# Patient Record
Sex: Female | Born: 1935 | Race: White | Hispanic: No | Marital: Married | Smoking: Former smoker
Health system: Southern US, Community
[De-identification: ages and names within clinical notes are randomized; demographics above are authoritative.]

## PROBLEM LIST (undated history)

## (undated) DIAGNOSIS — I350 Nonrheumatic aortic (valve) stenosis: Secondary | ICD-10-CM

## (undated) DIAGNOSIS — M353 Polymyalgia rheumatica: Secondary | ICD-10-CM

## (undated) DIAGNOSIS — H409 Unspecified glaucoma: Secondary | ICD-10-CM

## (undated) DIAGNOSIS — E785 Hyperlipidemia, unspecified: Secondary | ICD-10-CM

## (undated) DIAGNOSIS — M1990 Unspecified osteoarthritis, unspecified site: Secondary | ICD-10-CM

## (undated) DIAGNOSIS — I739 Peripheral vascular disease, unspecified: Secondary | ICD-10-CM

## (undated) DIAGNOSIS — I10 Essential (primary) hypertension: Secondary | ICD-10-CM

## (undated) DIAGNOSIS — I82413 Acute embolism and thrombosis of femoral vein, bilateral: Secondary | ICD-10-CM

## (undated) DIAGNOSIS — K311 Adult hypertrophic pyloric stenosis: Secondary | ICD-10-CM

## (undated) DIAGNOSIS — Z9189 Other specified personal risk factors, not elsewhere classified: Secondary | ICD-10-CM

## (undated) DIAGNOSIS — C649 Malignant neoplasm of unspecified kidney, except renal pelvis: Secondary | ICD-10-CM

## (undated) HISTORY — DX: Other specified personal risk factors, not elsewhere classified: Z91.89

## (undated) HISTORY — DX: Peripheral vascular disease, unspecified: I73.9

## (undated) HISTORY — PX: ABDOMINAL HYSTERECTOMY: SHX81

## (undated) HISTORY — PX: TONSILLECTOMY: SUR1361

## (undated) HISTORY — DX: Acute embolism and thrombosis of femoral vein, bilateral: I82.413

## (undated) HISTORY — DX: Nonrheumatic aortic (valve) stenosis: I35.0

## (undated) HISTORY — DX: Polymyalgia rheumatica: M35.3

## (undated) HISTORY — DX: Hyperlipidemia, unspecified: E78.5

## (undated) HISTORY — DX: Hypercalcemia: E83.52

## (undated) HISTORY — PX: APPENDECTOMY: SHX54

## (undated) HISTORY — PX: MASTECTOMY: SHX3

## (undated) HISTORY — DX: Unspecified osteoarthritis, unspecified site: M19.90

## (undated) HISTORY — DX: Essential (primary) hypertension: I10

---
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
## 1983-08-19 HISTORY — PX: BREAST SURGERY: SHX581

## 2004-09-04 ENCOUNTER — Ambulatory Visit: Payer: Self-pay | Admitting: Internal Medicine

## 2004-11-20 ENCOUNTER — Encounter: Admission: RE | Admit: 2004-11-20 | Discharge: 2004-11-20 | Payer: Self-pay | Admitting: Internal Medicine

## 2005-03-17 ENCOUNTER — Ambulatory Visit: Payer: Self-pay | Admitting: Internal Medicine

## 2005-06-19 ENCOUNTER — Ambulatory Visit: Payer: Self-pay | Admitting: Internal Medicine

## 2005-09-11 ENCOUNTER — Ambulatory Visit: Payer: Self-pay | Admitting: Internal Medicine

## 2005-10-03 ENCOUNTER — Ambulatory Visit: Payer: Self-pay | Admitting: Internal Medicine

## 2005-10-13 ENCOUNTER — Ambulatory Visit: Payer: Self-pay

## 2005-10-13 ENCOUNTER — Encounter: Payer: Self-pay | Admitting: Internal Medicine

## 2005-10-22 ENCOUNTER — Encounter
Admission: RE | Admit: 2005-10-22 | Discharge: 2006-01-20 | Payer: Self-pay | Admitting: Physical Medicine & Rehabilitation

## 2005-10-22 ENCOUNTER — Ambulatory Visit: Payer: Self-pay | Admitting: Physical Medicine & Rehabilitation

## 2005-11-03 ENCOUNTER — Encounter
Admission: RE | Admit: 2005-11-03 | Discharge: 2006-01-07 | Payer: Self-pay | Admitting: Physical Medicine & Rehabilitation

## 2005-11-17 ENCOUNTER — Ambulatory Visit: Payer: Self-pay | Admitting: Internal Medicine

## 2005-11-19 ENCOUNTER — Ambulatory Visit: Payer: Self-pay | Admitting: Internal Medicine

## 2005-11-24 ENCOUNTER — Encounter: Admission: RE | Admit: 2005-11-24 | Discharge: 2005-11-24 | Payer: Self-pay | Admitting: Internal Medicine

## 2005-12-01 ENCOUNTER — Ambulatory Visit: Payer: Self-pay | Admitting: Physical Medicine & Rehabilitation

## 2005-12-29 ENCOUNTER — Ambulatory Visit: Payer: Self-pay | Admitting: Physical Medicine & Rehabilitation

## 2006-01-14 ENCOUNTER — Ambulatory Visit: Payer: Self-pay | Admitting: Internal Medicine

## 2006-06-05 ENCOUNTER — Encounter: Admission: RE | Admit: 2006-06-05 | Discharge: 2006-06-05 | Payer: Self-pay | Admitting: Obstetrics and Gynecology

## 2006-06-11 ENCOUNTER — Ambulatory Visit: Payer: Self-pay | Admitting: Internal Medicine

## 2006-11-17 ENCOUNTER — Encounter: Payer: Self-pay | Admitting: Internal Medicine

## 2006-11-17 ENCOUNTER — Ambulatory Visit: Payer: Self-pay | Admitting: Internal Medicine

## 2006-11-25 ENCOUNTER — Ambulatory Visit: Payer: Self-pay | Admitting: Internal Medicine

## 2006-11-26 ENCOUNTER — Encounter: Admission: RE | Admit: 2006-11-26 | Discharge: 2006-11-26 | Payer: Self-pay | Admitting: Internal Medicine

## 2006-12-10 ENCOUNTER — Ambulatory Visit: Payer: Self-pay

## 2007-01-04 ENCOUNTER — Ambulatory Visit: Payer: Self-pay | Admitting: Internal Medicine

## 2007-01-05 LAB — CONVERTED CEMR LAB
GFR calc Af Amer: 78 mL/min
GFR calc non Af Amer: 65 mL/min
Glucose, Bld: 94 mg/dL (ref 70–99)
Potassium: 3.9 meq/L (ref 3.5–5.1)
Sodium: 142 meq/L (ref 135–145)

## 2007-01-07 ENCOUNTER — Ambulatory Visit: Payer: Self-pay | Admitting: Cardiology

## 2007-01-13 ENCOUNTER — Ambulatory Visit: Payer: Self-pay | Admitting: Internal Medicine

## 2007-04-05 ENCOUNTER — Telehealth: Payer: Self-pay | Admitting: Internal Medicine

## 2007-04-30 ENCOUNTER — Ambulatory Visit: Payer: Self-pay | Admitting: Internal Medicine

## 2007-04-30 DIAGNOSIS — M810 Age-related osteoporosis without current pathological fracture: Secondary | ICD-10-CM | POA: Insufficient documentation

## 2007-04-30 LAB — CONVERTED CEMR LAB
ALT: 27 units/L (ref 0–35)
AST: 33 units/L (ref 0–37)
Basophils Relative: 0.4 % (ref 0.0–1.0)
Bilirubin, Direct: 0.2 mg/dL (ref 0.0–0.3)
CO2: 32 meq/L (ref 19–32)
Calcium: 9.5 mg/dL (ref 8.4–10.5)
Creatinine, Ser: 0.8 mg/dL (ref 0.4–1.2)
Eosinophils Relative: 1.6 % (ref 0.0–5.0)
GFR calc Af Amer: 90 mL/min
Glucose, Bld: 95 mg/dL (ref 70–99)
Hemoglobin: 13.2 g/dL (ref 12.0–15.0)
Lymphocytes Relative: 39.4 % (ref 12.0–46.0)
Neutro Abs: 3.6 10*3/uL (ref 1.4–7.7)
Nitrite: NEGATIVE
Platelets: 272 10*3/uL (ref 150–400)
RDW: 12 % (ref 11.5–14.6)
Specific Gravity, Urine: 1.015
Total Bilirubin: 0.9 mg/dL (ref 0.3–1.2)
Total Protein: 6.7 g/dL (ref 6.0–8.3)
VLDL: 10 mg/dL (ref 0–40)
WBC Urine, dipstick: NEGATIVE
WBC: 6.7 10*3/uL (ref 4.5–10.5)

## 2007-05-06 DIAGNOSIS — E785 Hyperlipidemia, unspecified: Secondary | ICD-10-CM | POA: Insufficient documentation

## 2007-05-06 DIAGNOSIS — M1990 Unspecified osteoarthritis, unspecified site: Secondary | ICD-10-CM | POA: Insufficient documentation

## 2007-05-06 DIAGNOSIS — I10 Essential (primary) hypertension: Secondary | ICD-10-CM | POA: Insufficient documentation

## 2007-05-06 DIAGNOSIS — I872 Venous insufficiency (chronic) (peripheral): Secondary | ICD-10-CM | POA: Insufficient documentation

## 2007-05-07 ENCOUNTER — Ambulatory Visit: Payer: Self-pay | Admitting: Internal Medicine

## 2007-05-07 LAB — CONVERTED CEMR LAB: Cholesterol, target level: 200 mg/dL

## 2007-05-28 ENCOUNTER — Ambulatory Visit: Payer: Self-pay | Admitting: Internal Medicine

## 2007-06-29 ENCOUNTER — Telehealth: Payer: Self-pay | Admitting: Internal Medicine

## 2007-11-30 ENCOUNTER — Encounter: Admission: RE | Admit: 2007-11-30 | Discharge: 2007-11-30 | Payer: Self-pay | Admitting: Internal Medicine

## 2008-05-23 ENCOUNTER — Ambulatory Visit: Payer: Self-pay | Admitting: Internal Medicine

## 2008-05-23 ENCOUNTER — Encounter: Payer: Self-pay | Admitting: *Deleted

## 2008-05-23 LAB — CONVERTED CEMR LAB
Bilirubin Urine: NEGATIVE
Glucose, Urine, Semiquant: NEGATIVE
Ketones, urine, test strip: NEGATIVE
Protein, U semiquant: NEGATIVE
Urobilinogen, UA: 0.2
pH: 7

## 2008-05-26 LAB — CONVERTED CEMR LAB
ALT: 24 units/L (ref 0–35)
AST: 32 units/L (ref 0–37)
Basophils Absolute: 0.1 10*3/uL (ref 0.0–0.1)
Basophils Relative: 0.7 % (ref 0.0–3.0)
Bilirubin, Direct: 0.2 mg/dL (ref 0.0–0.3)
CO2: 33 meq/L — ABNORMAL HIGH (ref 19–32)
Calcium: 9.8 mg/dL (ref 8.4–10.5)
Chloride: 104 meq/L (ref 96–112)
Cholesterol: 192 mg/dL (ref 0–200)
Creatinine, Ser: 0.8 mg/dL (ref 0.4–1.2)
Eosinophils Absolute: 0.1 10*3/uL (ref 0.0–0.7)
GFR calc non Af Amer: 74 mL/min
HDL: 97.9 mg/dL (ref >39.0)
LDL Cholesterol: 82 mg/dL (ref 0–99)
Lymphocytes Relative: 40.2 % (ref 12.0–46.0)
MCHC: 35.3 g/dL (ref 30.0–36.0)
MCV: 95 fL (ref 78.0–100.0)
Neutrophils Relative %: 49 % (ref 43.0–77.0)
RDW: 12.4 % (ref 11.5–14.6)
Sodium: 144 meq/L (ref 135–145)
TSH: 1.64 microintl units/mL (ref 0.35–5.50)
Total Bilirubin: 0.9 mg/dL (ref 0.3–1.2)
Triglycerides: 60 mg/dL (ref 0–149)
VLDL: 12 mg/dL (ref 0–40)

## 2008-06-07 ENCOUNTER — Telehealth: Payer: Self-pay | Admitting: *Deleted

## 2008-06-14 ENCOUNTER — Ambulatory Visit: Payer: Self-pay | Admitting: Internal Medicine

## 2008-06-15 ENCOUNTER — Encounter: Payer: Self-pay | Admitting: Internal Medicine

## 2008-06-15 LAB — CONVERTED CEMR LAB: Fecal Occult Bld: NEGATIVE

## 2008-07-17 ENCOUNTER — Encounter: Admission: RE | Admit: 2008-07-17 | Discharge: 2008-07-17 | Payer: Self-pay | Admitting: Obstetrics and Gynecology

## 2008-08-18 ENCOUNTER — Encounter: Payer: Self-pay | Admitting: Internal Medicine

## 2008-11-10 ENCOUNTER — Ambulatory Visit (HOSPITAL_COMMUNITY): Admission: RE | Admit: 2008-11-10 | Discharge: 2008-11-10 | Payer: Self-pay | Admitting: Obstetrics and Gynecology

## 2008-12-01 ENCOUNTER — Encounter: Admission: RE | Admit: 2008-12-01 | Discharge: 2008-12-01 | Payer: Self-pay | Admitting: Internal Medicine

## 2008-12-07 ENCOUNTER — Emergency Department (HOSPITAL_COMMUNITY): Admission: EM | Admit: 2008-12-07 | Discharge: 2008-12-07 | Payer: Self-pay | Admitting: Emergency Medicine

## 2008-12-26 ENCOUNTER — Encounter: Payer: Self-pay | Admitting: *Deleted

## 2009-05-01 ENCOUNTER — Encounter: Payer: Self-pay | Admitting: Internal Medicine

## 2009-05-29 ENCOUNTER — Ambulatory Visit: Payer: Self-pay | Admitting: Internal Medicine

## 2009-05-29 DIAGNOSIS — H409 Unspecified glaucoma: Secondary | ICD-10-CM | POA: Insufficient documentation

## 2009-05-29 HISTORY — DX: Unspecified glaucoma: H40.9

## 2009-05-31 LAB — CONVERTED CEMR LAB
ALT: 22 units/L (ref 0–35)
AST: 27 units/L (ref 0–37)
Alkaline Phosphatase: 46 units/L (ref 39–117)
Basophils Absolute: 0 10*3/uL (ref 0.0–0.1)
Basophils Relative: 0.8 % (ref 0.0–3.0)
Bilirubin, Direct: 0.1 mg/dL (ref 0.0–0.3)
CO2: 29 meq/L (ref 19–32)
Chloride: 105 meq/L (ref 96–112)
Eosinophils Relative: 2.3 % (ref 0.0–5.0)
HCT: 36 % (ref 36.0–46.0)
Hemoglobin: 12.6 g/dL (ref 12.0–15.0)
LDL Cholesterol: 91 mg/dL (ref 0–99)
Lymphocytes Relative: 37.3 % (ref 12.0–46.0)
Monocytes Relative: 6.6 % (ref 3.0–12.0)
Neutro Abs: 3.4 10*3/uL (ref 1.4–7.7)
Potassium: 4.5 meq/L (ref 3.5–5.1)
RBC: 3.81 M/uL — ABNORMAL LOW (ref 3.87–5.11)
RDW: 12.6 % (ref 11.5–14.6)
Total Bilirubin: 0.9 mg/dL (ref 0.3–1.2)
Total CHOL/HDL Ratio: 2
Total Protein: 7.3 g/dL (ref 6.0–8.3)
WBC: 6.2 10*3/uL (ref 4.5–10.5)

## 2009-06-18 ENCOUNTER — Ambulatory Visit: Payer: Self-pay | Admitting: Internal Medicine

## 2009-06-18 ENCOUNTER — Ambulatory Visit (HOSPITAL_COMMUNITY): Admission: RE | Admit: 2009-06-18 | Discharge: 2009-06-18 | Payer: Self-pay | Admitting: Internal Medicine

## 2009-06-18 ENCOUNTER — Encounter: Payer: Self-pay | Admitting: Internal Medicine

## 2009-06-18 ENCOUNTER — Ambulatory Visit: Payer: Self-pay

## 2009-10-16 ENCOUNTER — Ambulatory Visit: Payer: Self-pay | Admitting: Internal Medicine

## 2009-11-05 ENCOUNTER — Encounter: Payer: Self-pay | Admitting: Internal Medicine

## 2009-11-05 ENCOUNTER — Ambulatory Visit: Payer: Self-pay

## 2009-11-05 ENCOUNTER — Ambulatory Visit: Payer: Self-pay | Admitting: Internal Medicine

## 2009-11-05 ENCOUNTER — Ambulatory Visit (HOSPITAL_COMMUNITY): Admission: RE | Admit: 2009-11-05 | Discharge: 2009-11-05 | Payer: Self-pay | Admitting: Family Medicine

## 2009-11-20 ENCOUNTER — Ambulatory Visit: Payer: Self-pay | Admitting: Internal Medicine

## 2009-11-20 LAB — CONVERTED CEMR LAB
BUN: 20 mg/dL (ref 6–23)
Basophils Relative: 0.6 % (ref 0.0–3.0)
CO2: 29 meq/L (ref 19–32)
Chloride: 104 meq/L (ref 96–112)
Eosinophils Absolute: 0.1 10*3/uL (ref 0.0–0.7)
Eosinophils Relative: 1.2 % (ref 0.0–5.0)
HCT: 36.6 % (ref 36.0–46.0)
Lymphs Abs: 2.2 10*3/uL (ref 0.7–4.0)
MCHC: 35.4 g/dL (ref 30.0–36.0)
MCV: 93 fL (ref 78.0–100.0)
Monocytes Absolute: 0.4 10*3/uL (ref 0.1–1.0)
Platelets: 234 10*3/uL (ref 150.0–400.0)
Potassium: 4.6 meq/L (ref 3.5–5.1)
Prothrombin Time: 10.8 s (ref 9.1–11.7)
WBC: 7 10*3/uL (ref 4.5–10.5)

## 2009-11-26 ENCOUNTER — Inpatient Hospital Stay (HOSPITAL_BASED_OUTPATIENT_CLINIC_OR_DEPARTMENT_OTHER): Admission: RE | Admit: 2009-11-26 | Discharge: 2009-11-26 | Payer: Self-pay | Admitting: Internal Medicine

## 2009-11-26 ENCOUNTER — Ambulatory Visit: Payer: Self-pay | Admitting: Internal Medicine

## 2009-11-28 ENCOUNTER — Ambulatory Visit (HOSPITAL_COMMUNITY): Admission: RE | Admit: 2009-11-28 | Discharge: 2009-11-28 | Payer: Self-pay | Admitting: Obstetrics and Gynecology

## 2009-11-29 ENCOUNTER — Ambulatory Visit: Payer: Self-pay | Admitting: Surgery

## 2009-11-29 ENCOUNTER — Encounter: Payer: Self-pay | Admitting: Internal Medicine

## 2009-12-04 ENCOUNTER — Encounter: Admission: RE | Admit: 2009-12-04 | Discharge: 2009-12-04 | Payer: Self-pay | Admitting: Internal Medicine

## 2009-12-04 LAB — HM MAMMOGRAPHY

## 2009-12-16 HISTORY — PX: CARDIAC VALVE REPLACEMENT: SHX585

## 2009-12-24 ENCOUNTER — Encounter: Payer: Self-pay | Admitting: Surgery

## 2009-12-26 ENCOUNTER — Encounter: Payer: Self-pay | Admitting: Surgery

## 2009-12-26 ENCOUNTER — Inpatient Hospital Stay (HOSPITAL_COMMUNITY): Admission: RE | Admit: 2009-12-26 | Discharge: 2009-12-30 | Payer: Self-pay | Admitting: Surgery

## 2009-12-26 ENCOUNTER — Ambulatory Visit: Payer: Self-pay | Admitting: Surgery

## 2009-12-27 ENCOUNTER — Encounter: Payer: Self-pay | Admitting: Internal Medicine

## 2010-01-03 ENCOUNTER — Encounter: Payer: Self-pay | Admitting: Internal Medicine

## 2010-01-17 ENCOUNTER — Ambulatory Visit: Payer: Self-pay | Admitting: Internal Medicine

## 2010-01-22 ENCOUNTER — Ambulatory Visit: Payer: Self-pay | Admitting: Surgery

## 2010-01-22 ENCOUNTER — Encounter: Admission: RE | Admit: 2010-01-22 | Discharge: 2010-01-22 | Payer: Self-pay | Admitting: Surgery

## 2010-01-22 ENCOUNTER — Encounter: Payer: Self-pay | Admitting: Internal Medicine

## 2010-02-21 ENCOUNTER — Ambulatory Visit: Payer: Self-pay | Admitting: Internal Medicine

## 2010-03-07 ENCOUNTER — Encounter: Payer: Self-pay | Admitting: Internal Medicine

## 2010-03-07 ENCOUNTER — Encounter (HOSPITAL_COMMUNITY)
Admission: RE | Admit: 2010-03-07 | Discharge: 2010-05-17 | Payer: Self-pay | Source: Home / Self Care | Admitting: Internal Medicine

## 2010-03-11 ENCOUNTER — Encounter: Payer: Self-pay | Admitting: Internal Medicine

## 2010-03-21 ENCOUNTER — Encounter: Payer: Self-pay | Admitting: Internal Medicine

## 2010-03-28 ENCOUNTER — Encounter: Payer: Self-pay | Admitting: Internal Medicine

## 2010-04-01 ENCOUNTER — Encounter: Payer: Self-pay | Admitting: Internal Medicine

## 2010-04-08 ENCOUNTER — Telehealth: Payer: Self-pay | Admitting: Internal Medicine

## 2010-04-12 ENCOUNTER — Ambulatory Visit: Payer: Self-pay | Admitting: Internal Medicine

## 2010-05-01 ENCOUNTER — Encounter: Payer: Self-pay | Admitting: Internal Medicine

## 2010-05-18 ENCOUNTER — Encounter (HOSPITAL_COMMUNITY)
Admission: RE | Admit: 2010-05-18 | Discharge: 2010-08-16 | Payer: Self-pay | Source: Home / Self Care | Attending: Internal Medicine | Admitting: Internal Medicine

## 2010-06-07 ENCOUNTER — Ambulatory Visit: Payer: Self-pay | Admitting: Internal Medicine

## 2010-06-11 LAB — CONVERTED CEMR LAB
AST: 32 units/L (ref 0–37)
Albumin: 3.9 g/dL (ref 3.5–5.2)
Alkaline Phosphatase: 60 units/L (ref 39–117)
Basophils Absolute: 0 10*3/uL (ref 0.0–0.1)
Basophils Relative: 0.5 % (ref 0.0–3.0)
Calcium: 9.4 mg/dL (ref 8.4–10.5)
Eosinophils Relative: 1.2 % (ref 0.0–5.0)
GFR calc non Af Amer: 76.82 mL/min (ref >60)
HDL: 77.1 mg/dL (ref >39.00)
Hemoglobin: 12.1 g/dL (ref 12.0–15.0)
Lymphocytes Relative: 29.6 % (ref 12.0–46.0)
Monocytes Relative: 6 % (ref 3.0–12.0)
Neutro Abs: 4.4 10*3/uL (ref 1.4–7.7)
RBC: 3.89 M/uL (ref 3.87–5.11)
Sodium: 142 meq/L (ref 135–145)
Total CHOL/HDL Ratio: 2
Total Protein: 6.8 g/dL (ref 6.0–8.3)
VLDL: 11.8 mg/dL (ref 0.0–40.0)
WBC: 7 10*3/uL (ref 4.5–10.5)

## 2010-07-02 ENCOUNTER — Ambulatory Visit: Payer: Self-pay | Admitting: Internal Medicine

## 2010-07-02 DIAGNOSIS — M2550 Pain in unspecified joint: Secondary | ICD-10-CM | POA: Insufficient documentation

## 2010-07-05 LAB — CONVERTED CEMR LAB
ANA Titer 1: 1:160 {titer} — ABNORMAL HIGH
Anti Nuclear Antibody(ANA): POSITIVE — AB
Sed Rate: 20 mm/hr (ref 0–22)
Uric Acid, Serum: 4.7 mg/dL (ref 2.4–7.0)

## 2010-07-15 ENCOUNTER — Telehealth: Payer: Self-pay | Admitting: Internal Medicine

## 2010-07-29 ENCOUNTER — Encounter: Payer: Self-pay | Admitting: Internal Medicine

## 2010-08-05 ENCOUNTER — Encounter: Payer: Self-pay | Admitting: Internal Medicine

## 2010-08-20 ENCOUNTER — Encounter: Payer: Self-pay | Admitting: Internal Medicine

## 2010-09-15 LAB — CONVERTED CEMR LAB
Sed Rate: 20 mm/hr (ref 0–22)
Total CK: 92 units/L (ref 7–177)
Uric Acid, Serum: 4.7 mg/dL (ref 2.4–7.0)

## 2010-09-17 NOTE — Consult Note (Signed)
Summary: [facility]  [facility]   Imported By: Maryln Gottron 12/27/2009 14:04:53  _____________________________________________________________________  External Attachment:    Type:   Image     Comment:   External Document

## 2010-09-17 NOTE — Assessment & Plan Note (Signed)
Summary: per check out/sf   Visit Type:  Follow-up Primary Provider:  Madelin Headings MD  CC:  no  complaints.  History of Present Illness: Ms. Kim Donovan is a delightful, 77 year old woman who is the aunt of Dr. Dietrich Pates. She has a history of aortic stenosis and is s/p bioprostheitc AVR on 12/26/09.  Remainder of her past medical history is notable for borderline hypertension with questionable white coat syndrome.    Returns for post-AVR f/u.  Feels well gretting her energy back. Does get orthostatic/presyncopal at times but it is getting better. No edema or SOB. Starting CR in 2 weeks.   Current Medications (verified): 1)  Simvastatin 20 Mg  Tabs (Simvastatin) .Marland Kitchen.. 1 Tablet By Mouth Daily 2)  Travatan Z 0.004 %  Soln (Travoprost) .Marland Kitchen.. 1 Gtt in Rt Eye Once Daily 3)  Baby Aspirin 81 Mg  Chew (Aspirin) .Marland Kitchen.. 1 By Mouth Once Daily 4)  Centrum Silver Ultra Womens  Tabs (Multiple Vitamins-Minerals) 5)  Fish Oil 1000 Mg  Caps (Omega-3 Fatty Acids) .Marland Kitchen.. 1 By Mouth Once Daily 6)  Citracal/vitamin D 250-200 Mg-Unit Tabs (Calcium Citrate-Vitamin D) .... Take 1 By Mouth Three Times A Day 7)  Dorzolamide-Timolol 2-0.5 % Soln (Dorzolamide-Timolol) 8)  Reclast 5 Mg/189ml Soln (Zoledronic Acid) .... Started in March 2010  Allergies (verified): 1)  ! * Tramadol  Past History:  Past Medical History: Last updated: 01/17/2010 Hyperlipidemia Hypertension Osteoarthritis Osteoporosis/osteopenia Peripheral vascular disease  vwenous disease  Aortic stenosis, severe       --Myoview neg ischemia April 2008       --echo 11/10: mean AVA gradient  ~47       --s/p bioprosthetic AVR  Review of Systems       As per HPI and past medical history; otherwise all systems negative.   Vital Signs:  Patient profile:   77 year old female Menstrual status:  hysterectomy Height:      67 inches Weight:      145 pounds BMI:     22.79 Pulse rate:   75 / minute BP sitting:   128 / 74  (left arm) Cuff size:    regular  Vitals Entered By: Hardin Negus, RMA (February 21, 2010 1:53 PM)  Physical Exam  General:  Looks good. no resp difficulty HEENT: normal Neck: supple. no JVD.  Cor: Sternal site looks good. PMI nondisplaced. Regular rate & rhythm. No rubs, Valve sounds good. crisp s2 Lungs: clear Abdomen: soft, nontender, nondistended. Good bowel sounds. Extremities: no cyanosis, clubbing, rash, edema Neuro: alert & orientedx3, cranial nerves grossly intact. moves all 4 extremities w/o difficulty. affect pleasant    Impression & Recommendations:  Problem # 1:  AORTIC STENOSIS (ICD-424.1) s/p AVR. Doing well. Mild orthostasis. Reminded her to get up slowly and stay hydrated. Can use compression stockings as needed. Agree with CR.   Patient Instructions: 1)  Your physician wants you to follow-up in:  4 months.  You will receive a reminder letter in the mail two months in advance. If you don't receive a letter, please call our office to schedule the follow-up appointment.

## 2010-09-17 NOTE — Progress Notes (Signed)
Summary: Calling regarding getting a referral  Phone Note Call from Patient Call back at Home Phone [phone]   Caller: Patient Summary of Call: Pt regarding a referral for Dr.Beckman Initial call taken by: Judie Grieve,  July 15, 2010 1:10 PM  Follow-up for Phone Call        Dr Gala Romney wants her to see a rheumatologist - was to see her husbands but he is not taking new pts.  needs a referral to Dr Dierdre Forth, she thinks his name is.  Aware I will forward to DB/and [facility] for review and she will be contacted about the appointment/referral Follow-up by: Charolotte Capuchin, RN,  July 15, 2010 2:45 PM  Additional Follow-up for Phone Call Additional follow up Details #1::        order placed for referral to Dr Ronne Binning, RN  July 18, 2010 4:35 PM

## 2010-09-17 NOTE — Miscellaneous (Signed)
Summary: Progress Report/[facility]  Progress Report/[facility]   Imported By: Maryln Gottron 07/24/2010 10:25:50  _____________________________________________________________________  External Attachment:    Type:   Image     Comment:   External Document

## 2010-09-17 NOTE — Assessment & Plan Note (Signed)
Summary: f28m   Visit Type:  Follow-up Primary Provider:  Madelin Headings MD  CC:  no complaints.  History of Present Illness: Ms. Sweeney is a delightful, 77 year old woman who is the aunt of Dr. Dietrich Pates. She has a history of aortic stenosis and is s/p bioprostheitc AVR on 12/26/09.  Remainder of her past medical history is notable for borderline hypertension with questionable white coat syndrome.    Finished cardiac rehab 2 weeks ago. Over past 3-4 weeks having severe musculoskeletal pain particularly in shoulders, back of the arms and right knee. Much worse in am and before she goes to bed. Dreads getting out of bed. During day much better. About 10 days ago stopped simva and didn't notice any improvement. No hot joints, rash or fevers. Saw Dr. Fabian Sharp who recommended tylenol. Verys stressed out and worries alot. Not sleeping well. Feels a bit down.   No palpitations, CP or dyspnea.   Current Medications (verified): 1)  Simvastatin 20 Mg  Tabs (Simvastatin) .Marland Kitchen.. 1 Tablet By Mouth Daily 2)  Travatan Z 0.004 %  Soln (Travoprost) .Marland Kitchen.. 1 Gtt in Rt Eye Once Daily 3)  Baby Aspirin 81 Mg  Chew (Aspirin) .Marland Kitchen.. 1 By Mouth Once Daily 4)  Centrum Silver Ultra Womens  Tabs (Multiple Vitamins-Minerals) 5)  Fish Oil 1000 Mg  Caps (Omega-3 Fatty Acids) .Marland Kitchen.. 1 By Mouth Once Daily 6)  Citracal/vitamin D 250-200 Mg-Unit Tabs (Calcium Citrate-Vitamin D) .... Take 1 By Mouth Three Times A Day 7)  Dorzolamide-Timolol 2-0.5 % Soln (Dorzolamide-Timolol) 8)  Reclast 5 Mg/145ml Soln (Zoledronic Acid) .... Started in March 2010 9)  Amoxicillin 500 Mg Caps (Amoxicillin) .... 4 Tabs By Mouth 1 Hour Before Dental Procedure  Allergies (verified): 1)  ! * Tramadol  Past History:  Past Medical History: Last updated: 06/07/2010 Hyperlipidemia Hypertension Osteoarthritis Osteoporosis/osteopenia Glaucoma Peripheral vascular disease  venous disease  Aortic stenosis, severe       --Myoview neg ischemia April 2008       --echo 11/10: mean AVA gradient  ~47       --s/p bioprosthetic AVR  May 2011  Review of Systems       As per HPI and past medical history; otherwise all systems negative.   Vital Signs:  Patient profile:   77 year old female Menstrual status:  hysterectomy Height:      66.75 inches Weight:      144 pounds BMI:     22.80 Pulse rate:   81 / minute BP sitting:   124 / 70  (left arm) Cuff size:   regular  Vitals Entered By: Hardin Negus, RMA (July 02, 2010 10:59 AM)  Physical Exam  General:  Well appearing. mildly anxious no resp difficulty HEENT: normal Neck: supple. no JVD. Carotids 2+ bilat; no bruits. No lymphadenopathy or thryomegaly appreciated. Cor: PMI nondisplaced. Regular rate & rhythm. No rubs, gallops, murmur. Lungs: clear Abdomen: soft, nontender, nondistended. No hepatosplenomegaly. No bruits or masses. Good bowel sounds. Extremities: no cyanosis, clubbing, rash, edema. no hot joints or rash. no trigger points. Neuro: alert & orientedx3, cranial nerves grossly intact. moves all 4 extremities w/o difficulty. affect pleasant    Impression & Recommendations:  Problem # 1:  AORTIC STENOSIS (ICD-424.1) S/p AVR doing well from cardiac prespective  Problem # 2:  Anxiety/depression This seems to be getting worse. I think she warrants trial of SSRI. Start Celexa 10mg  daily. Titrate as needed.  Problem # 3:  PAIN IN JOINT, SITE UNSPECIFIED (ICD-719.40)  Suspect this is osteoarthritits. Will check seroliges to rule-out anything more ominous If no improvement have suggested f/u with her husband's rheumatologist Dr. Norina Buzzard.   Problem # 4:  HYPERLIPIDEMIA (ICD-272.4) Most recent lipids look great with LDL 77. Doube joint pains related but will hold off until we seem some improvement. Check CK as well.   Other Orders: EKG w/ Interpretation (93000) T-Antinuclear Antib (ANA) [phone]) T-Sed Rate (Automated) [phone]) T-Rheumatoid Factor  [phone]) T-Uric Acid (Blood) [phone]) T-CK Total [phone])  Patient Instructions: 1)  Labs today 2)  Start Celexa 10mg  daily 3)  Follow up in 4 months Prescriptions: CELEXA 10 MG TABS (CITALOPRAM HYDROBROMIDE) Take 1 tablet by mouth once a day  #30 x 6   Entered by:   Meredith Staggers, RN   Authorized by:   Dolores Patty, MD, [facility]   Signed by:   Meredith Staggers, RN on 07/02/2010   Method used:   Electronically to        [facility] [address] [phone]* (retail)       [address]       [place], [place]  [address]       Ph: [phone] or [phone]       Fax: [phone]   RxID:   [id number]

## 2010-09-17 NOTE — Miscellaneous (Signed)
Summary: [facility]   [facility]   Imported By: Roderic Ovens 05/16/2010 15:06:50  _____________________________________________________________________  External Attachment:    Type:   Image     Comment:   External Document

## 2010-09-17 NOTE — Miscellaneous (Signed)
Summary: [facility]  [facility]   Imported By: Roderic Ovens 04/04/2010 13:00:51  _____________________________________________________________________  External Attachment:    Type:   Image     Comment:   External Document

## 2010-09-17 NOTE — Miscellaneous (Signed)
Summary: [facility]  [facility]   Imported By: Roderic Ovens 04/04/2010 13:01:54  _____________________________________________________________________  External Attachment:    Type:   Image     Comment:   External Document

## 2010-09-17 NOTE — Cardiovascular Report (Signed)
Summary: Pre Cath Order  Pre Cath Order   Imported By: Roderic Ovens 11/28/2009 10:35:46  _____________________________________________________________________  External Attachment:    Type:   Image     Comment:   External Document

## 2010-09-17 NOTE — Letter (Signed)
Summary: [facility]  [facility]   Imported By: Maryln Gottron 02/25/2010 12:52:11  _____________________________________________________________________  External Attachment:    Type:   Image     Comment:   External Document

## 2010-09-17 NOTE — Letter (Signed)
Summary: Cardiac Catheterization Instructions- [facility]  [facility], Main Office  [address]   [place], [place] [address]   Phone: [phone]  Fax: [phone]     11/20/2009 MRN: [id number]  [facility] [address] [place], [place]  [address]  Dear Ms. Condon,   You are scheduled for a Cardiac Catheterization on Monday 11/26/09 with Dr. Gala Romney  Please arrive to the 1st floor of the Heart and V[facility] Center at [facility] at 7:30 am / pm on the day of your procedure. Please do not arrive before 6:30 a.m. Call the Heart and Vascular Center at [phone] if you are unable to make your appointmnet. The Code to get into the parking garage under the building is 0001 Take the elevators to the 1st floor. You must have someone to drive you home. Someone must be with you for the first 24 hours after you arrive home. Please wear clothes that are easy to get on and off and wear slip-on shoes. Do not eat or drink after midnight except water with your medications that morning. Bring all your medications and current insurance cards with you.  ___ DO NOT take these medications before your procedure: ________________________________________________________________  ___ Make sure you take your aspirin.  _X__ You may take ALL of your medications with water that morning. ________________________________________________________________________________________________________________________________  ___ DO NOT take ANY medications before your procedure.  ___ Pre-med instructions:  ________________________________________________________________________________________________________________________________  The usual length of stay after your procedure is 2 to 3 hours. This can vary.  If you have any questions, please call the office at the number listed above.   Meredith Staggers, RN

## 2010-09-17 NOTE — Miscellaneous (Signed)
Summary: [facility] Phys[facility] Order/Treatment Plan  [facility] Physician Order/Treatment Plan   Imported By: Roderic Ovens 01/10/2010 13:47:31  _____________________________________________________________________  External Attachment:    Type:   Image     Comment:   External Document

## 2010-09-17 NOTE — Assessment & Plan Note (Signed)
Summary: 9:00  rov   Visit Type:  Follow-up Primary Provider:  Madelin Headings MD  CC:  no complaints.  History of Present Illness: Kim Donovan is a delightful, 77 year old woman who is the aunt of Dr. Dietrich Pates who returns for routine follow up of her severe AS.  Remainder of her past medical history is notable for borderline hypertension with questionable white coat syndrome.    She is here with her husband to disucss possible AVR. F/u echo 2 weks ago confirmed severe AS. However she remains asymptomatic.  Walks for 30+ minutes almost every day with husband without SOB, CP. No dizziness. Has mild LE swelling due to varicose veins s/p repair by Dr. Donia Ast. No orthopnea or PND. No change in exercise tolerance - never has problem keeping up with husband or others while walking. No syncope or presyncope.  Current Medications (verified): 1)  Simvastatin 20 Mg  Tabs (Simvastatin) .Marland Kitchen.. 1 Tablet By Mouth Daily 2)  Travatan Z 0.004 %  Soln (Travoprost) .Marland Kitchen.. 1 Gtt in Rt Eye Once Daily 3)  Baby Aspirin 81 Mg  Chew (Aspirin) .Marland Kitchen.. 1 By Mouth Once Daily 4)  Centrum Silver Ultra Womens  Tabs (Multiple Vitamins-Minerals) 5)  Fish Oil 1000 Mg  Caps (Omega-3 Fatty Acids) .Marland Kitchen.. 1 By Mouth Once Daily 6)  Citracal/vitamin D 250-200 Mg-Unit Tabs (Calcium Citrate-Vitamin D) 7)  Dorzolamide-Timolol 2-0.5 % Soln (Dorzolamide-Timolol) 8)  Reclast 5 Mg/111ml Soln (Zoledronic Acid) .... Started in March 2010 9)  Trigosamine .Marland Kitchen.. 2 Tabs Once Daily  Allergies (verified): No Known Drug Allergies  Review of Systems       As per HPI and past medical history; otherwise all systems negative.   Vital Signs:  Patient profile:   77 year old female Menstrual status:  hysterectomy Height:      66.75 inches Weight:      147 pounds BMI:     23.28 Pulse rate:   65 / minute BP sitting:   128 / 84  (left arm) Cuff size:   regular  Vitals Entered By: Hardin Negus, RMA (November 20, 2009 8:55 AM)  Physical  Exam  General:  Gen: well appearing. no resp difficulty HEENT: normal Neck: supple. no JVD. Carotids just minimally delayed bilat; +bilat radiated  bruits. Cor: PMI nondisplaced. Regular rate & rhythm. No rubs, gallops/ 2-3/6 mid peaking murmur AS. S2 only mildly depressed Lungs: clear Abdomen: soft, nontender, nondistended. Good bowel sounds. Extremities: no cyanosis, clubbing, rash, edema Neuro: alert & orientedx3, cranial nerves grossly intact. moves all 4 extremities w/o difficulty. affect pleasant    Impression & Recommendations:  Problem # 1:  AORTIC STENOSIS (ICD-424.1) Although she is asymptomatic, repeat echo confirms severe calcific AS. I had a long talk with her and her husband about timing of surgery. Given the degree of stenosis and her age, my preference would be to have the valve replaced sooner rather than later to minimize the risk of surgical complications. Additionally, the prospect of the surgery has weighed heavy on her mentally and I think it is best if we push forward and get this behind [place]. They understand these issues and want to proceed. We will cardiac cath for next week and then have her f/u with Dr. Laneta Simmers.  Other Orders: Cardiac Catheterization (Cardiac Cath) TLB-BMP (Basic Metabolic Panel-BMET) (80048-METABOL) TLB-CBC Platelet - w/Differential (85025-CBCD) TLB-PT (Protime) (85610-PTP)  Patient Instructions: 1)  Labs today 2)  Your physician has requested that you have a cardiac catheterization.  Cardiac catheterization  is used to diagnose and/or treat various heart conditions. Doctors may recommend this procedure for a number of different reasons. The most common reason is to evaluate chest pain. Chest pain can be a symptom of coronary artery disease (CAD), and cardiac catheterization can show whether plaque is narrowing or blocking your heart's arteries. This procedure is also used to evaluate the valves, as well as measure the blood flow and oxygen levels in  different parts of your heart.  For further information please visit [email or web address]  Please follow instruction sheet, as given. 3)  Follow up in 2 months

## 2010-09-17 NOTE — Assessment & Plan Note (Signed)
Summary: FLU-SHOT/RCD  Nurse Visit   Allergies: 1)  ! * Tramadol  Orders Added: 1)  Flu Vaccine 52yrs + [90658] 2)  Administration Flu vaccine - MCR [G0008]    Flu Vaccine Consent Questions     Do you have a history of severe allergic reactions to this vaccine? no    Any prior history of allergic reactions to egg and/or gelatin? no    Do you have a sensitivity to the preservative Thimersol? no    Do you have a past history of Guillan-Barre Syndrome? no    Do you currently have an acute febrile illness? no    Have you ever had a severe reaction to latex? no    Vaccine information given and explained to patient? yes    Are you currently pregnant? no    Lot Number:[id number]   Exp Date:02/15/2011   Site Given  Left Deltoid IM Romualdo Bolk, CMA (AAMA)  April 12, 2010 8:51 AM

## 2010-09-17 NOTE — Progress Notes (Signed)
Summary: pt needs pre meds  Phone Note Call from Patient Call back at Home Phone [phone]   Caller: Patient Reason for Call: Talk to Nurse, Talk to Doctor Summary of Call: pt needs to be pre medicated prior to cleaning on 8/31 called into [facility] [address] [phone] Initial call taken by: Omer Jack,  April 08, 2010 1:00 PM  Follow-up for Phone Call        rx sent in pt aware Meredith Staggers, RN  April 08, 2010 1:30 PM     New/Updated Medications: AMOXICILLIN 500 MG CAPS (AMOXICILLIN) 4 tabs by mouth 1 hour before dental procedure Prescriptions: AMOXICILLIN 500 MG CAPS (AMOXICILLIN) 4 tabs by mouth 1 hour before dental procedure  #4 x 4   Entered by:   Meredith Staggers, RN   Authorized by:   Dolores Patty, MD, [facility]   Signed by:   Meredith Staggers, RN on 04/08/2010   Method used:   Electronically to        [facility] [address] [phone]* (retail)       [address]       [place], [place]  [address]       Ph: [phone] or [phone]       Fax: [phone]   RxID:   [id number]

## 2010-09-17 NOTE — Assessment & Plan Note (Signed)
Summary: eph/ appt is 9:15/ gd   Primary Provider:  Madelin Headings MD  CC:  ROV-; No complaints.  History of Present Illness: Ms. Dockstader is a delightful, 77 year old woman who is the aunt of Dr. Dietrich Pates. She has a history of aortic stenosis and is s/p bioprostheitc AVR on 12/26/09.  Remainder of her past medical history is notable for borderline hypertension with questionable white coat syndrome.    Returns for post-AVR f/u. Had to stop metoprolol due to profound fatiue and low BP. Feels well but has been afraid to go out and do anything. Denies CP or SOB. No edema. No problem with sternal site.   Current Medications (verified): 1)  Simvastatin 20 Mg  Tabs (Simvastatin) .Marland Kitchen.. 1 Tablet By Mouth Daily 2)  Travatan Z 0.004 %  Soln (Travoprost) .Marland Kitchen.. 1 Gtt in Rt Eye Once Daily 3)  Baby Aspirin 81 Mg  Chew (Aspirin) .Marland Kitchen.. 1 By Mouth Once Daily 4)  Centrum Silver Ultra Womens  Tabs (Multiple Vitamins-Minerals) 5)  Fish Oil 1000 Mg  Caps (Omega-3 Fatty Acids) .Marland Kitchen.. 1 By Mouth Once Daily 6)  Citracal/vitamin D 250-200 Mg-Unit Tabs (Calcium Citrate-Vitamin D) .... Take 1 By Mouth Three Times A Day 7)  Dorzolamide-Timolol 2-0.5 % Soln (Dorzolamide-Timolol) 8)  Reclast 5 Mg/117ml Soln (Zoledronic Acid) .... Started in March 2010 9)  Trigosamine .Marland Kitchen.. 2 Tabs Once Daily 10)  Aleve 220 Mg Tabs (Naproxen Sodium) .... As Needed 11)  Mylanta 200-200-20 Mg/9ml Susp (Alum & Mag Hydroxide-Simeth) .... As Needed  Allergies (verified): 1)  ! * Tramadol  Past History:  Past Medical History: Hyperlipidemia Hypertension Osteoarthritis Osteoporosis/osteopenia Peripheral vascular disease  vwenous disease  Aortic stenosis, severe       --Myoview neg ischemia April 2008       --echo 11/10: mean AVA gradient  ~47       --s/p bioprosthetic AVR  Review of Systems       As per HPI and past medical history; otherwise all systems negative.   Vital Signs:  Patient profile:   77 year old female Menstrual  status:  hysterectomy Height:      67 inches Weight:      144 pounds BMI:     22.64 Pulse rate:   82 / minute BP sitting:   142 / 90  (right arm) Cuff size:   regular  Vitals Entered By: Stanton Kidney, EMT-P (January 17, 2010 9:22 AM)  Physical Exam  General:  Looks good. no resp difficulty HEENT: normal Neck: supple. no JVD. Carotids just minimally delayed bilat; +bilat radiated  bruits. Cor: Sternal site looks good. PMI nondisplaced. Regular rate & rhythm. No rubs, Valve sounds good. crisp s2 Lungs: clear Abdomen: soft, nontender, nondistended. Good bowel sounds. Extremities: no cyanosis, clubbing, rash, edema Neuro: alert & orientedx3, cranial nerves grossly intact. moves all 4 extremities w/o difficulty. affect pleasant    Impression & Recommendations:  Problem # 1:  AORTIC STENOSIS (ICD-424.1) S/p AVR. Doing very well. Encouraged her to increase her activity level. I am also worried she may be developing some post-op depression. We discussed a brief course of sertraline but she wants to see how she does by increasing her activity level first. Will start cardiac rehab after she sees Dr. Laneta Simmers next week.   Other Orders: EKG w/ Interpretation (93000)  Patient Instructions: 1)  Follow up in 4-6 weeks

## 2010-09-17 NOTE — Assessment & Plan Note (Signed)
Summary: 4 month rov/sl   Visit Type:  Follow-up Primary Provider:  Madelin Headings MD  CC:  no complaints.  History of Present Illness: Kim Donovan is a delightful, 77 year old woman who is the aunt of Dr. Dietrich Pates who returns for routine follow up of her severe AS.  Remainder of her past medical history is notable for borderline hypertension with questionable white coat syndrome.    Echo in 11/10 which showed normal LV function with severeAS with mean gradient 47mm HG which had progressed from previous.  Remains fairly active walks for 30+ minutes almost every day with husband without SOB, CP. No dizziness. Has mild LE swelling due to varicose veins s/p repair by Dr. Donia Ast. No orthopnea or PND. No change in exercise tolerance - never has problem keeping up with husband or others while walking. No syncope or presyncope.  Current Medications (verified): 1)  Simvastatin 20 Mg  Tabs (Simvastatin) .Marland Kitchen.. 1 Tablet By Mouth Daily 2)  Travatan Z 0.004 %  Soln (Travoprost) .Marland Kitchen.. 1 Gtt in Rt Eye Once Daily 3)  Baby Aspirin 81 Mg  Chew (Aspirin) .Marland Kitchen.. 1 By Mouth Once Daily 4)  Centrum Silver Ultra Womens  Tabs (Multiple Vitamins-Minerals) 5)  Fish Oil 1000 Mg  Caps (Omega-3 Fatty Acids) .Marland Kitchen.. 1 By Mouth Once Daily 6)  Citracal/vitamin D 250-200 Mg-Unit Tabs (Calcium Citrate-Vitamin D) 7)  Dorzolamide-Timolol 2-0.5 % Soln (Dorzolamide-Timolol) 8)  Reclast 5 Mg/120ml Soln (Zoledronic Acid) .... Started in March 2010 9)  Trigosamine .Marland Kitchen.. 2 Tabs Once Daily  Allergies (verified): No Known Drug Allergies  Past History:  Past Medical History: Hyperlipidemia Hypertension Osteoarthritis Osteoporosis/osteopenia Peripheral vascular disease  vwenous disease  Aortic stenosis, severe       --Myoview neg ischemia April 2008       --echo 11/10: mean AVA gradient  ~47 G0P0  Review of Systems       As per HPI and past medical history; otherwise all systems negative.   Vital Signs:  Patient profile:    77 year old female Menstrual status:  hysterectomy Height:      66.75 inches Weight:      148 pounds BMI:     23.44 Pulse rate:   58 / minute BP sitting:   126 / 74  (right arm) Cuff size:   regular  Vitals Entered By: Hardin Negus, RMA (October 16, 2009 9:08 AM)  Physical Exam  General:  Gen: well appearing. no resp difficulty HEENT: normal Neck: supple. no JVD. Carotids just minimally delayed bilat; +bilat radiated  bruits. Cor: PMI nondisplaced. Regular rate & rhythm. No rubs, gallops/ 2-3/6 mid peaking murmur AS. S2 only mildly depressed Lungs: clear Abdomen: soft, nontender, nondistended. Good bowel sounds. Extremities: no cyanosis, clubbing, rash, edema Neuro: alert & orientedx3, cranial nerves grossly intact. moves all 4 extremities w/o difficulty. affect pleasant    Impression & Recommendations:  Problem # 1:  AORTIC STENOSIS (ICD-424.1) By most recent echo this is severe, however she remains asymptomatic and on exam AS seems moderate at most. However, I do think it is only a matter of time before she becomes symptomatic and given her age, I favor proceeding to AVR early rather than later (likely within the next year or two) and I discussed this with her and Dr. Tenny Craw. Will get f/u echo to reassess valve.   Problem # 2:  HYPERTENSION (ICD-401.9) Blood pressure well controlled. Continue current regimen.  Other Orders: EKG w/ Interpretation (93000) Echocardiogram (Echo)  Patient Instructions:  1)  Your physician has requested that you have an echocardiogram.  Echocardiography is a painless test that uses sound waves to create images of your heart. It provides your doctor with information about the size and shape of your heart and how well your heart's chambers and valves are working.  This procedure takes approximately one hour. There are no restrictions for this procedure. 2)  Follow up in 4 months

## 2010-09-17 NOTE — Letter (Signed)
[facility]   Imported By: Marylou Mccoy 04/09/2010 10:29:11  _____________________________________________________________________  External Attachment:    Type:   Image     Comment:   External Document

## 2010-09-17 NOTE — Miscellaneous (Signed)
Summary: [facility]  [facility]   Imported By: Roderic Ovens 03/25/2010 11:19:13  _____________________________________________________________________  External Attachment:    Type:   Image     Comment:   External Document

## 2010-09-17 NOTE — Assessment & Plan Note (Signed)
Summary: EMP-WILL FAST//CCM   Vital Signs:  Patient profile:   77 year old female Menstrual status:  hysterectomy Height:      66.75 inches Weight:      142 pounds Pulse rate:   60 / minute BP sitting:   148 / 80  (left arm) Cuff size:   regular  Vitals Entered By: Romualdo Bolk, CMA (AAMA) (June 07, 2010 8:20 AM)  Serial Vital Signs/Assessments:  Time      Position  BP       Pulse  Resp  Temp     By                     140/80                         Madelin Headings MD  Comments: right arm sitting By: Madelin Headings MD   CC: Annual Visit for Disease Management- Pt is fasting for labs   History of Present Illness: Kim Donovan comes in today  for  check  up and disease management  Here for Medicare AWV:  1.   Risk factors based on Past M, S, F history: 2.   Physical Activities:  cardiac rehab  3.   Depression/mood:    stable no dx depression anxiety 4.   Hearing:  good.  5.   ADL's:   self  independent.  6.   Fall Risk:  No falls   7.   Home Safety:  reviewed  8.   Height, weight, &visual acuity:  has galucoma under rx and doing well   9.   Counseling:  10.   Labs ordered based on risk factors:  11.           Referral Coordination  12.           Care PlanUTD on mammogram  13.            Cognitive Assessment  Pt is A&Ox3,affect,speech,memory,attention,&motor skills appear intact.  Since last  check up  has had her AVR surgery.  Last week of rehab  post avr surgery  . She recently has had increased  now joint aches esp knees and hands  with increase intensity exercise   Ocass tylenol   mentally feels well but some frustration on bady aches  no fever  and no myalgias doesnt think from  statin med. Is to see  Dr Jones Broom November 15th .      Preventive Care Screening  Prior Values:    Mammogram:  ASSESSMENT: Negative - BI-RADS 1^MM DIGITAL SCREENING (12/04/2009)    Colonoscopy:  normal (08/19/2003)    Last Tetanus Booster:  Td (01/07/2008)    Last Flu Shot:   Fluvax 3+ (04/12/2010)    Last Pneumovax:  given (08/18/2005)   Preventive Screening-Counseling & Management  Alcohol-Tobacco     Alcohol drinks/day: 1     Alcohol type: all     Smoking Status: quit     Year Quit: 30 years  Caffeine-Diet-Exercise     Caffeine use/day: 2 mugs a day     Does Patient Exercise: yes     Type of exercise: walking     Times/week: 5  Hep-HIV-STD-Contraception     Dental Visit-last 6 months yes     Sun Exposure-Excessive: no  Safety-Violence-Falls     Seat Belt Use: yes     Firearms in the Home: no firearms in the home  Smoke Detectors: yes  Current Medications (verified): 1)  Simvastatin 20 Mg  Tabs (Simvastatin) .Marland Kitchen.. 1 Tablet By Mouth Daily 2)  Travatan Z 0.004 %  Soln (Travoprost) .Marland Kitchen.. 1 Gtt in Rt Eye Once Daily 3)  Baby Aspirin 81 Mg  Chew (Aspirin) .Marland Kitchen.. 1 By Mouth Once Daily 4)  Centrum Silver Ultra Womens  Tabs (Multiple Vitamins-Minerals) 5)  Fish Oil 1000 Mg  Caps (Omega-3 Fatty Acids) .Marland Kitchen.. 1 By Mouth Once Daily 6)  Citracal/vitamin D 250-200 Mg-Unit Tabs (Calcium Citrate-Vitamin D) .... Take 1 By Mouth Three Times A Day 7)  Dorzolamide-Timolol 2-0.5 % Soln (Dorzolamide-Timolol) 8)  Reclast 5 Mg/133ml Soln (Zoledronic Acid) .... Started in March 2010 9)  Amoxicillin 500 Mg Caps (Amoxicillin) .... 4 Tabs By Mouth 1 Hour Before Dental Procedure  Allergies (verified): 1)  ! * Tramadol  Past History:  Past medical, surgical, family and social histories (including risk factors) reviewed, and no changes noted (except as noted below).  Past Medical History: Hyperlipidemia Hypertension Osteoarthritis Osteoporosis/osteopenia Glaucoma Peripheral vascular disease  venous disease  Aortic stenosis, severe       --Myoview neg ischemia April 2008       --echo 11/10: mean AVA gradient  ~47       --s/p bioprosthetic AVR  May 2011  Past Surgical History: Reviewed history from 05/29/2009 and no changes required. Appendectomy Hysterectomy  total  for fibroids   Mastectomy Tonsillectomy   Breast surgery 1985   Past History:  Care Management: Gynecology: [facility] Gastroenterology: Juanda [facility] Ophthalmology: [facility]ottie Dawson and [facility] for glaucoma Cardiology : [facility]   Family History: Reviewed history from 05/23/2008 and no changes required. [place] MOm died when pt age 42 from gb sugery ? dvt Fa died 73 MI CVA aunt osteoporosis  Social History: Reviewed history from 05/23/2008 and no changes required. Married non smoker  remote hx walks  2 hh    no pets  Risk analyst Use:  yes Dental Care w/in 6 mos.:  yes Sun Exposure-Excessive:  no  Review of Systems       neg GI GU pulmonary , joints problematic ., NO change vision hearing  has glaucoma  no bleeding injury change in neuro status. rest of ros neg or [place]   Physical Exam  General:  Well-developed,well-nourished,in no acute distress; alert,appropriate and cooperative throughout examination Head:  normocephalic and atraumatic.   Eyes:  vision grossly intact, pupils equal, and pupils round.   Ears:  R ear normal and L ear normal.   Nose:  no external deformity and no nasal discharge.   Mouth:  good dentition and pharynx pink and moist.   Neck:  No deformities, masses, or tenderness noted. Chest Wall:  well healed scars Breasts:  No mass, nodules, thickening, tenderness, bulging, retraction, inflamation, nipple discharge or skin changes noted.   Lungs:  Normal respiratory effort, chest expands symmetrically. Lungs are clear to auscultation, no crackles or wheezes. Heart:  normal rate, regular rhythm, no gallop, and no JVD.  slight sys mlusb non radiation Abdomen:  Bowel sounds positive,abdomen soft and non-tender without masses, organomegaly or hernias noted. no bruits  Genitalia:  per gyne Msk:  oa changes no redness has heberdens nodes  no redness  midl swelling right knee joint  Pulses:  pulses intact without delay   Extremities:  no clubbing cyanosis or edema  Neurologic:   alert & oriented X3 and gait normal.  non focal  Pt is A&Ox3,affect,speech,memory,attention,&motor skills appear intact.  Skin:  turgor normal, color normal,  no ecchymoses, and no petechiae.  lle  chroninc medial changes no pain Cervical Nodes:  No lymphadenopathy noted Axillary Nodes:  No palpable lymphadenopathy Inguinal Nodes:  No significant adenopathy Psych:  Oriented X3, normally interactive, good eye contact, not anxious appearing, and not depressed appearing.     Impression & Recommendations:  Problem # 1:  Preventive Health Care (ICD-V70.0)  reviewed  prevention parameters and diet  continued exercise and fall prevention. currently decline zostovax as husband  is on embrel but stable and he cannot have live vaccines.    seemingly ok for her but she will ask Dr Jimmy Footman her husbands RHEUM.   Orders: Medicare -1st Annual Wellness Visit [phone])  Problem # 2:  HYPERLIPIDEMIA (ICD-272.4)  Her updated medication list for this problem includes:    Simvastatin 20 Mg Tabs (Simvastatin) .Marland Kitchen... 1 tablet by mouth daily  Orders: TLB-Lipid Panel (80061-LIPID) TLB-BMP (Basic Metabolic Panel-BMET) (80048-METABOL) TLB-Hepatic/Liver Function Pnl (80076-HEPATIC) Venipuncture (81191) Specimen Handling (47829)  Labs Reviewed: SGOT: 27 (05/29/2009)   SGPT: 22 (05/29/2009)  Lipid Goals: Chol Goal: 200 (05/07/2007)   HDL Goal: 40 (05/07/2007)   LDL Goal: 100 (05/07/2007)   TG Goal: 150 (05/07/2007)  Prior 10 Yr Risk Heart Disease: 5 % (05/07/2007)   HDL:86.50 (05/29/2009), 97.9 (05/23/2008)  LDL:91 (05/29/2009), 82 (05/23/2008)  Chol:187 (05/29/2009), 192 (05/23/2008)  Trig:49.0 (05/29/2009), 60 (05/23/2008)  Problem # 3:  OSTEOPOROSIS (ICD-733.00)  Her updated medication list for this problem includes:    Citracal/vitamin D 250-200 Mg-unit Tabs (Calcium citrate-vitamin d) .Marland Kitchen... Take 1 by mouth three times a day    Reclast 5 Mg/125ml Soln (Zoledronic acid) ..... Started in march  2010  Orders: TLB-BMP (Basic Metabolic Panel-BMET) (80048-METABOL) TLB-CBC Platelet - w/Differential (85025-CBCD) TLB-TSH (Thyroid Stimulating Hormone) (84443-TSH)  Problem # 4:  OSTEOARTHRITIS (ICD-715.90) problematic  with card reahb joints aknees and hands  but finfishing .  counseled about htis  doesnt seem to be related to  myalgia or meds  had sig decongditioning pre op.   tylenol    pre exercise and as needed .  Her updated medication list for this problem includes:    Baby Aspirin 81 Mg Chew (Aspirin) .Marland Kitchen... 1 by mouth once daily  Orders: TLB-CBC Platelet - w/Differential (85025-CBCD)  Problem # 5:  AORTIC STENOSIS (ICD-424.1) stable  post op  by hx  Her updated medication list for this problem includes:    Baby Aspirin 81 Mg Chew (Aspirin) .Marland Kitchen... 1 by mouth once daily  Orders: TLB-BMP (Basic Metabolic Panel-BMET) (80048-METABOL)  Problem # 6:  HYPERTENSION (ICD-401.9) up some today   take readings at home and has been nl and in rehab .  Has appt with cards soon. Orders: TLB-BMP (Basic Metabolic Panel-BMET) (80048-METABOL)  Complete Medication List: 1)  Simvastatin 20 Mg Tabs (Simvastatin) .Marland Kitchen.. 1 tablet by mouth daily 2)  Travatan Z 0.004 % Soln (Travoprost) .Marland Kitchen.. 1 gtt in rt eye once daily 3)  Baby Aspirin 81 Mg Chew (Aspirin) .Marland Kitchen.. 1 by mouth once daily 4)  Centrum Silver Ultra Womens Tabs (Multiple vitamins-minerals) 5)  Fish Oil 1000 Mg Caps (Omega-3 fatty acids) .Marland Kitchen.. 1 by mouth once daily 6)  Citracal/vitamin D 250-200 Mg-unit Tabs (Calcium citrate-vitamin d) .... Take 1 by mouth three times a day 7)  Dorzolamide-timolol 2-0.5 % Soln (Dorzolamide-timolol) 8)  Reclast 5 Mg/166ml Soln (Zoledronic acid) .... Started in march 2010 9)  Amoxicillin 500 Mg Caps (Amoxicillin) .... 4 tabs by mouth 1 hour before dental procedure  Patient Instructions: 1)  You will be informed of lab results when available.  will get you a copy. 2)  return office visit in 6 months or as  needed. Prescriptions: SIMVASTATIN 20 MG  TABS (SIMVASTATIN) 1 tablet by mouth daily  #90 x 3   Entered by:   Romualdo Bolk, CMA (AAMA)   Authorized by:   Madelin Headings MD   Signed by:   Romualdo Bolk, CMA (AAMA) on 06/07/2010   Method used:   Faxed to ...       [facility]* (retail)             ,          Ph: [phone]       Fax: [phone]   RxID:   [id number]    Orders Added: 1)  TLB-Lipid Panel [80061-LIPID] 2)  TLB-BMP (Basic Metabolic Panel-BMET) [80048-METABOL] 3)  TLB-CBC Platelet - w/Differential [85025-CBCD] 4)  TLB-Hepatic/Liver Function Pnl [80076-HEPATIC] 5)  TLB-TSH (Thyroid Stimulating Hormone) [84443-TSH] 6)  Venipuncture [24235] 7)  Specimen Handling [99000] 8)  Medicare -1st Annual Wellness Visit [G0438] 9)  Est. Patient Level III [36144]     Eye Exam  Last Eye Exam- 09/18/2009- Normal-except for glucoma- Dr. Eilleen Kempf, CMA (AAMA)  June 07, 2010 8:23 AM   Preventive Care Screening  Prior Values:    Mammogram:  ASSESSMENT: Negative - BI-RADS 1^MM DIGITAL SCREENING (12/04/2009)    Colonoscopy:  normal (08/19/2003)    Last Tetanus Booster:  Td (01/07/2008)    Last Flu Shot:  Fluvax 3+ (04/12/2010)    Last Pneumovax:  given (08/18/2005)

## 2010-09-19 NOTE — Letter (Signed)
[facility]   Imported By: Marylou Mccoy 07/29/2010 16:38:29  _____________________________________________________________________  External Attachment:    Type:   Image     Comment:   External Document

## 2010-09-19 NOTE — Letter (Signed)
Summary: [facility]   [facility] Progress Note   Imported By: Roderic Ovens 09/03/2010 14:35:27  _____________________________________________________________________  External Attachment:    Type:   Image     Comment:   External Document

## 2010-09-19 NOTE — Letter (Signed)
Summary: [facility] Progress Note   [facility] Progress Note   Imported By: Roderic Ovens 09/03/2010 14:36:01  _____________________________________________________________________  External Attachment:    Type:   Image     Comment:   External Document

## 2010-09-19 NOTE — Letter (Signed)
Summary: [facility] Extended Visit Note   [facility] Extended Visit Note   Imported By: Roderic Ovens 09/11/2010 13:40:41  _____________________________________________________________________  External Attachment:    Type:   Image     Comment:   External Document

## 2010-09-30 ENCOUNTER — Other Ambulatory Visit: Payer: Self-pay | Admitting: Obstetrics and Gynecology

## 2010-10-07 ENCOUNTER — Ambulatory Visit
Admission: RE | Admit: 2010-10-07 | Discharge: 2010-10-07 | Disposition: A | Discharge status: Home / Self Care | Payer: BC Managed Care – PPO | Source: Ambulatory Visit | Attending: Obstetrics and Gynecology | Admitting: Obstetrics and Gynecology

## 2010-10-21 ENCOUNTER — Encounter: Payer: Self-pay | Admitting: Internal Medicine

## 2010-11-04 LAB — CBC
Hemoglobin: 9.4 g/dL — ABNORMAL LOW (ref 12.0–15.0)
MCHC: 34.4 g/dL (ref 30.0–36.0)
MCV: 93.6 fL (ref 78.0–100.0)
RBC: 2.92 MIL/uL — ABNORMAL LOW (ref 3.87–5.11)
WBC: 9.1 10*3/uL (ref 4.0–10.5)

## 2010-11-05 ENCOUNTER — Ambulatory Visit (INDEPENDENT_AMBULATORY_CARE_PROVIDER_SITE_OTHER): Payer: MEDICARE | Admitting: Internal Medicine

## 2010-11-05 ENCOUNTER — Encounter: Payer: Self-pay | Admitting: Internal Medicine

## 2010-11-05 VITALS — BP 126/82 | Resp 18 | Ht 67.0 in | Wt 145.0 lb

## 2010-11-05 DIAGNOSIS — E785 Hyperlipidemia, unspecified: Secondary | ICD-10-CM

## 2010-11-05 DIAGNOSIS — F329 Major depressive disorder, single episode, unspecified: Secondary | ICD-10-CM | POA: Insufficient documentation

## 2010-11-05 DIAGNOSIS — M353 Polymyalgia rheumatica: Secondary | ICD-10-CM

## 2010-11-05 DIAGNOSIS — I350 Nonrheumatic aortic (valve) stenosis: Secondary | ICD-10-CM

## 2010-11-05 DIAGNOSIS — I10 Essential (primary) hypertension: Secondary | ICD-10-CM

## 2010-11-05 DIAGNOSIS — I359 Nonrheumatic aortic valve disorder, unspecified: Secondary | ICD-10-CM

## 2010-11-05 HISTORY — DX: Polymyalgia rheumatica: M35.3

## 2010-11-05 LAB — CREATININE, SERUM
Creatinine, Ser: 0.62 mg/dL (ref 0.4–1.2)
Creatinine, Ser: 0.78 mg/dL (ref 0.4–1.2)
GFR calc Af Amer: >60 mL/min (ref >60)
GFR calc Af Amer: >60 mL/min (ref >60)
GFR calc non Af Amer: >60 mL/min (ref >60)

## 2010-11-05 LAB — POCT I-STAT 4, (NA,K, GLUC, HGB,HCT)
Glucose, Bld: 106 mg/dL — ABNORMAL HIGH (ref 70–99)
Glucose, Bld: 121 mg/dL — ABNORMAL HIGH (ref 70–99)
HCT: 20 % — ABNORMAL LOW (ref 36.0–46.0)
HCT: 21 % — ABNORMAL LOW (ref 36.0–46.0)
Hemoglobin: 7.1 g/dL — ABNORMAL LOW (ref 12.0–15.0)
Hemoglobin: 7.5 g/dL — ABNORMAL LOW (ref 12.0–15.0)
Potassium: 2.9 mEq/L — ABNORMAL LOW (ref 3.5–5.1)
Potassium: 3.3 mEq/L — ABNORMAL LOW (ref 3.5–5.1)
Sodium: 135 mEq/L (ref 135–145)
Sodium: 138 mEq/L (ref 135–145)

## 2010-11-05 LAB — POCT I-STAT 3, ART BLOOD GAS (G3+)
O2 Saturation: 100 %
pCO2 arterial: 31 mmHg — ABNORMAL LOW (ref 35.0–45.0)
pCO2 arterial: 36.4 mmHg (ref 35.0–45.0)
pH, Arterial: 7.384 (ref 7.350–7.400)

## 2010-11-05 LAB — CBC
HCT: 28.4 % — ABNORMAL LOW (ref 36.0–46.0)
HCT: 29.9 % — ABNORMAL LOW (ref 36.0–46.0)
HCT: 31.5 % — ABNORMAL LOW (ref 36.0–46.0)
Hemoglobin: 10.3 g/dL — ABNORMAL LOW (ref 12.0–15.0)
Hemoglobin: 12.9 g/dL (ref 12.0–15.0)
MCHC: 34.3 g/dL (ref 30.0–36.0)
MCHC: 35.1 g/dL (ref 30.0–36.0)
MCHC: 35.3 g/dL (ref 30.0–36.0)
MCV: 94 fL (ref 78.0–100.0)
Platelets: 101 10*3/uL — ABNORMAL LOW (ref 150–400)
Platelets: 221 10*3/uL (ref 150–400)
Platelets: 83 10*3/uL — ABNORMAL LOW (ref 150–400)
Platelets: 97 10*3/uL — ABNORMAL LOW (ref 150–400)
Platelets: 98 10*3/uL — ABNORMAL LOW (ref 150–400)
RBC: 3.02 MIL/uL — ABNORMAL LOW (ref 3.87–5.11)
RBC: 3.05 MIL/uL — ABNORMAL LOW (ref 3.87–5.11)
RBC: 3.18 MIL/uL — ABNORMAL LOW (ref 3.87–5.11)
RDW: 13.3 % (ref 11.5–15.5)
RDW: 14.2 % (ref 11.5–15.5)
RDW: 14.3 % (ref 11.5–15.5)
RDW: 14.6 % (ref 11.5–15.5)
WBC: 10.3 10*3/uL (ref 4.0–10.5)
WBC: 10.8 10*3/uL — ABNORMAL HIGH (ref 4.0–10.5)
WBC: 10.9 10*3/uL — ABNORMAL HIGH (ref 4.0–10.5)
WBC: 13.1 10*3/uL — ABNORMAL HIGH (ref 4.0–10.5)

## 2010-11-05 LAB — GLUCOSE, CAPILLARY
Glucose-Capillary: 125 mg/dL — ABNORMAL HIGH (ref 70–99)
Glucose-Capillary: 136 mg/dL — ABNORMAL HIGH (ref 70–99)
Glucose-Capillary: 137 mg/dL — ABNORMAL HIGH (ref 70–99)
Glucose-Capillary: 88 mg/dL (ref 70–99)
Glucose-Capillary: 96 mg/dL (ref 70–99)

## 2010-11-05 LAB — COMPREHENSIVE METABOLIC PANEL
Alkaline Phosphatase: 46 U/L (ref 39–117)
BUN: 20 mg/dL (ref 6–23)
Calcium: 9.5 mg/dL (ref 8.4–10.5)
Creatinine, Ser: 0.77 mg/dL (ref 0.4–1.2)
Glucose, Bld: 104 mg/dL — ABNORMAL HIGH (ref 70–99)
Potassium: 4.1 mEq/L (ref 3.5–5.1)
Total Protein: 6.9 g/dL (ref 6.0–8.3)

## 2010-11-05 LAB — BASIC METABOLIC PANEL
BUN: 10 mg/dL (ref 6–23)
BUN: 17 mg/dL (ref 6–23)
CO2: 22 mEq/L (ref 19–32)
CO2: 25 mEq/L (ref 19–32)
Calcium: 7 mg/dL — ABNORMAL LOW (ref 8.4–10.5)
Calcium: 7.3 mg/dL — ABNORMAL LOW (ref 8.4–10.5)
Chloride: 108 mEq/L (ref 96–112)
Creatinine, Ser: 0.64 mg/dL (ref 0.4–1.2)
GFR calc Af Amer: >60 mL/min (ref >60)
GFR calc non Af Amer: >60 mL/min (ref >60)
GFR calc non Af Amer: >60 mL/min (ref >60)
Glucose, Bld: 121 mg/dL — ABNORMAL HIGH (ref 70–99)
Glucose, Bld: 150 mg/dL — ABNORMAL HIGH (ref 70–99)
Potassium: 3.3 mEq/L — ABNORMAL LOW (ref 3.5–5.1)
Potassium: 3.9 mEq/L (ref 3.5–5.1)
Sodium: 136 mEq/L (ref 135–145)

## 2010-11-05 LAB — BLOOD GAS, ARTERIAL
Acid-base deficit: 1 mmol/L (ref 0.0–2.0)
Drawn by: 206361
FIO2: 0.21 %
Patient temperature: 98.6
pCO2 arterial: 37.4 mmHg (ref 35.0–45.0)

## 2010-11-05 LAB — POCT I-STAT, CHEM 8
Calcium, Ion: 1.06 mmol/L — ABNORMAL LOW (ref 1.12–1.32)
Chloride: 107 mEq/L (ref 96–112)
Glucose, Bld: 142 mg/dL — ABNORMAL HIGH (ref 70–99)
HCT: 34 % — ABNORMAL LOW (ref 36.0–46.0)
Hemoglobin: 11.6 g/dL — ABNORMAL LOW (ref 12.0–15.0)
Hemoglobin: 9.5 g/dL — ABNORMAL LOW (ref 12.0–15.0)
Sodium: 143 mEq/L (ref 135–145)
TCO2: 21 mmol/L (ref 0–100)
TCO2: 22 mmol/L (ref 0–100)

## 2010-11-05 LAB — PROTIME-INR
INR: 1.65 — ABNORMAL HIGH (ref 0.00–1.49)
Prothrombin Time: 13 seconds (ref 11.6–15.2)
Prothrombin Time: 19.4 seconds — ABNORMAL HIGH (ref 11.6–15.2)

## 2010-11-05 LAB — URINALYSIS, ROUTINE W REFLEX MICROSCOPIC
Bilirubin Urine: NEGATIVE
Glucose, UA: NEGATIVE mg/dL
Protein, ur: NEGATIVE mg/dL
Specific Gravity, Urine: 1.011 (ref 1.005–1.030)

## 2010-11-05 LAB — URINE MICROSCOPIC-ADD ON

## 2010-11-05 LAB — TYPE AND SCREEN
ABO/RH(D): O POS
Antibody Screen: NEGATIVE

## 2010-11-05 LAB — HEMOGLOBIN AND HEMATOCRIT, BLOOD
HCT: 21.1 % — ABNORMAL LOW (ref 36.0–46.0)
Hemoglobin: 7.5 g/dL — ABNORMAL LOW (ref 12.0–15.0)

## 2010-11-05 LAB — HEMOGLOBIN A1C
Hgb A1c MFr Bld: 5.8 % — ABNORMAL HIGH (ref <5.7)
Mean Plasma Glucose: 120 mg/dL — ABNORMAL HIGH (ref <117)

## 2010-11-05 LAB — MAGNESIUM
Magnesium: 2.6 mg/dL — ABNORMAL HIGH (ref 1.5–2.5)
Magnesium: 2.9 mg/dL — ABNORMAL HIGH (ref 1.5–2.5)

## 2010-11-05 LAB — APTT: aPTT: 39 seconds — ABNORMAL HIGH (ref 24–37)

## 2010-11-05 LAB — PLATELET COUNT: Platelets: 115 10*3/uL — ABNORMAL LOW (ref 150–400)

## 2010-11-05 NOTE — Assessment & Plan Note (Signed)
Blood pressure well controlled. Continue current regimen. Mild white-coat syndrome.

## 2010-11-05 NOTE — Progress Notes (Signed)
   Patient ID: ZAMARIA BRAZZLE, female    DOB: 18-Jun-1929, 77 y.o.   MRN: [id number]  HPI  Ms. Loeb is a delightful, 77 year old woman who is the aunt of Dr. Dietrich Pates. She has a history of aortic stenosis and is s/p bioprostheitc AVR on 12/26/09.  Remainder of her past medical history is notable for borderline hypertension with questionable white coat syndrome. At last visit we sent her over to Dr. Dierdre Forth in Rheumatology. Diagnosed with PMR. Has been on prednisone since (now 8mg /day). Overall aches and pains better but still having pain from L elbow down to L hand. Pending x-rays with Dr. Dierdre Forth today.   BP very well controlled at home. BP log with systolics 100-120. Active all day. Walks 30+ minute every day without exertional symptoms. No edema. No palpitations, CP or dyspnea.   Denies depression. Tried 2 tabs of Zoloft in past and couldn't tolerate.   Lipids in 10/11: TC 166  HDL 77 LDL 77. Off simvastatin (followed by Dr. Fabian Sharp)  Review of Systems  All other systems normal except as listed in the HPI and Problem List.   Past Medical History  Diagnosis Date  . Hyperlipidemia   . Hypertension   . Osteoarthritis   . Osteoporosis/osteopenia increased risk   . Glaucoma   . PVD (peripheral vascular disease)   . Aortic stenosis     myoview neg ischemia 11/2006  ---- echo 11/10: mean AVA gradient~47   ---- s/p bioprosthetic AVR 12/2009  . Polymyalgia rheumatica 11/05/2010     Physical Exam   General:  Well appearing. no resp difficulty HEENT: normal Neck: supple. no JVD. Carotids 2+ bilat; no bruits. No lymphadenopathy or thryomegaly appreciated. Cor: PMI nondisplaced. Regular rate & rhythm. No rubs, gallops. Soft systolic ejection murmur over RSB Lungs: clear Abdomen: soft, nontender, nondistended. No hepatosplenomegaly. No bruits or masses. Good bowel sounds. Extremities: no cyanosis, clubbing, rash, edema. no hot joints or rash. Neuro: alert & orientedx3, cranial nerves grossly  intact. moves all 4 extremities w/o difficulty. affect pleasant   ECG: NSR 70. LAE No ST-T wave abnormalities.

## 2010-11-05 NOTE — Assessment & Plan Note (Addendum)
Stable. S/p bioprosthetic AVR. Discussed need for continued SBE prophylaxis. Echo in 6 months.

## 2010-11-05 NOTE — Assessment & Plan Note (Signed)
Resolved. Doing well.

## 2010-11-05 NOTE — Assessment & Plan Note (Signed)
Followed by Dr. Dierdre Forth. Continue prednisone.

## 2010-11-05 NOTE — Assessment & Plan Note (Signed)
Lipids look good. Followed by Dr. Fabian Sharp. May be worth recheck now that she is on prednisone.

## 2010-11-06 LAB — POCT I-STAT 3, VENOUS BLOOD GAS (G3P V)
Bicarbonate: 25.3 mEq/L — ABNORMAL HIGH (ref 20.0–24.0)
Bicarbonate: 26 mEq/L — ABNORMAL HIGH (ref 20.0–24.0)
TCO2: 27 mmol/L (ref 0–100)
TCO2: 27 mmol/L (ref 0–100)
pH, Ven: 7.369 — ABNORMAL HIGH (ref 7.250–7.300)
pH, Ven: 7.392 — ABNORMAL HIGH (ref 7.250–7.300)
pO2, Ven: 41 mmHg (ref 30.0–45.0)

## 2010-11-06 LAB — POCT I-STAT 3, ART BLOOD GAS (G3+)
Bicarbonate: 23.3 mEq/L (ref 20.0–24.0)
TCO2: 24 mmol/L (ref 0–100)
pCO2 arterial: 35.5 mmHg (ref 35.0–45.0)
pH, Arterial: 7.425 — ABNORMAL HIGH (ref 7.350–7.400)

## 2010-11-15 ENCOUNTER — Telehealth: Payer: Self-pay | Admitting: Internal Medicine

## 2010-11-15 NOTE — Telephone Encounter (Signed)
LOV,12 lead faxed to [facility] @ [phone] 11/15/10/KM

## 2010-11-17 HISTORY — PX: OTHER SURGICAL HISTORY: SHX169

## 2010-12-17 ENCOUNTER — Telehealth: Payer: Self-pay | Admitting: Internal Medicine

## 2010-12-17 HISTORY — PX: CARPAL TUNNEL RELEASE: SHX101

## 2010-12-17 NOTE — Telephone Encounter (Signed)
12,Ct,LOV faxed to [facility]  @ [phone]  12/17/10/km

## 2010-12-18 ENCOUNTER — Encounter (HOSPITAL_BASED_OUTPATIENT_CLINIC_OR_DEPARTMENT_OTHER)
Admission: RE | Admit: 2010-12-18 | Discharge: 2010-12-18 | Disposition: A | Discharge status: Home / Self Care | Payer: MEDICARE | Source: Ambulatory Visit | Attending: Orthopedic Surgery | Admitting: Orthopedic Surgery

## 2010-12-18 LAB — BASIC METABOLIC PANEL
Calcium: 9.9 mg/dL (ref 8.4–10.5)
Creatinine, Ser: 0.71 mg/dL (ref 0.4–1.2)
GFR calc Af Amer: >60 mL/min (ref >60)
Sodium: 138 mEq/L (ref 135–145)

## 2010-12-19 ENCOUNTER — Ambulatory Visit (HOSPITAL_BASED_OUTPATIENT_CLINIC_OR_DEPARTMENT_OTHER)
Admission: RE | Admit: 2010-12-19 | Discharge: 2010-12-19 | Disposition: A | Discharge status: Home / Self Care | Payer: MEDICARE | Source: Ambulatory Visit | Attending: Orthopedic Surgery | Admitting: Orthopedic Surgery

## 2010-12-19 DIAGNOSIS — M353 Polymyalgia rheumatica: Secondary | ICD-10-CM | POA: Insufficient documentation

## 2010-12-19 DIAGNOSIS — M65839 Other synovitis and tenosynovitis, unspecified forearm: Secondary | ICD-10-CM | POA: Insufficient documentation

## 2010-12-19 DIAGNOSIS — G5600 Carpal tunnel syndrome, unspecified upper limb: Secondary | ICD-10-CM | POA: Insufficient documentation

## 2010-12-19 DIAGNOSIS — Z954 Presence of other heart-valve replacement: Secondary | ICD-10-CM | POA: Insufficient documentation

## 2010-12-19 DIAGNOSIS — Z01812 Encounter for preprocedural laboratory examination: Secondary | ICD-10-CM | POA: Insufficient documentation

## 2010-12-19 LAB — POCT HEMOGLOBIN-HEMACUE: Hemoglobin: 12.8 g/dL (ref 12.0–15.0)

## 2010-12-20 NOTE — Op Note (Signed)
NAME:  Kim Donovan, Kim Donovan                ACCOUNT NO.:  [id number]  MEDICAL RECORD NO.:  [id number]           PATIENT TYPE:  LOCATION:                                 FACILITY:  PHYSICIAN:  Katy Fitch. Donyae Kilner, M.D. DATE OF BIRTH:  03-28-1929  DATE OF PROCEDURE:  12/19/2010 DATE OF DISCHARGE:                              OPERATIVE REPORT   PREOPERATIVE DIAGNOSES:  Severe left carpal tunnel syndrome with thenar atrophy and history of polymyalgia rheumatica.  POSTOPERATIVE DIAGNOSES:  Severe left carpal tunnel syndrome with thenar atrophy and history of polymyalgia rheumatica with identification of very inflammatory tenosynovitis of left ulnar bursa.  OPERATION:  Release of left transverse carpal ligament and exploration of ulnar bursa.  OPERATING SURGEON:  Katy Fitch. Lana Flaim, MD  ASSISTANT:  Annye Rusk, PA-C  ANESTHESIA:  General by LMA.  SUPERVISING ANESTHESIOLOGIST:  Bedelia Person, MD  INDICATIONS:  Kim Donovan is a 77 year old woman referred through the courtesy of Dr. Alben Deeds, rheumatologist for evaluation and management of hand numbness.  She had a history of prior aortic valve replacement and recent symptoms compatible with polymyalgia rheumatica. She had been very appropriately treated by Dr. Dierdre Forth for her inflammatory musculoskeletal symptoms.  She has had persistent hand numbness and arthralgias due to combination of her polymyalgia rheumatica and background osteoarthritis.  She was evaluated on an outpatient setting with evidence of significant left thumb CMC arthrosis and significant ulnar bursitis/carpal tunnel syndrome.  Electrodiagnostic studies confirmed very significant left carpal tunnel syndrome.  We recommended proceeding with release of her left transcarpal ligament. We discussed the arthritis at the base of her thumb.  At this point in time, she was more comfortable living with her arthritis symptoms rather than approaching it surgically.  After  informed consent, Kim Donovan is brought to the operating room at this time.  Due to her aortic valve replacement, she will be provided perioperative prophylactic antibiotics in the form of Ancef 1 gram IV.  PROCEDURE:  Kim Donovan was brought to room 1 of [facility] and placed in supine position on the operating table.  Following a detailed anesthesia informed consent by Dr. Gypsy Balsam, general anesthesia by LMA technique was recommended and accepted.  Under Dr. Burnett Corrente strict supervision, general anesthesia was induced followed by routine Betadine scrub of the left upper extremity.  A pneumatic tourniquet was applied to proximal left brachium.  Following exsanguination of left arm with an Esmarch bandage, the arterial tourniquet was inflated to 220 mmHg.  Procedure commenced with a short incision in the line of the ring finger of the palm.  Subcutaneous tissues were carefully divided revealing the palmar fascia.  This was split longitudinally to the transverse carpal ligament and thenar musculature.  The distal margin of the transcarpal ligament was isolated followed by use of scissors to sequentially release the ligament into the region of the carpal canal.  A bulging ulnar bursa was noted.  This was incised and a copious amount of amber synovial fluid was expressed.  This had a normal string sign. There was no sign of infection.  No loose bodies were noted.  The transcarpal  ligament was released along its ulnar border fully into the distal forearm.  This widely opened carpal canal.  No mass or other predicaments were appreciated.  The median nerve was inflamed.  The wound was then inspected for bleeding points which were electrocauterized with bipolar current followed by repair of the skin with intradermal 3-0 Prolene suture.  Compressive dressing was applied with volar plaster splint maintaining the wrist in 5 degrees of dorsiflexion.  For aftercare, Kim Donovan was  provided prescription for Vicodin 5 mg 1 p.o. q.4-6 h. p.r.n. pain, 20 tablets without refill.  She is also provided direction to use Tylenol or Aleve for mild pain.     Katy Fitch Briley Bumgarner, M.D.     RVS/MEDQ  D:  12/19/2010  T:  12/19/2010  Job:  [id number]  cc:   Alben Deeds, MD  Electronically Signed by Josephine Igo M.D. on 12/20/2010 10:13:26 AM

## 2010-12-23 ENCOUNTER — Encounter (HOSPITAL_COMMUNITY): Payer: MEDICARE | Attending: Obstetrics and Gynecology

## 2010-12-23 DIAGNOSIS — M810 Age-related osteoporosis without current pathological fracture: Secondary | ICD-10-CM | POA: Insufficient documentation

## 2010-12-30 ENCOUNTER — Other Ambulatory Visit: Payer: Self-pay | Admitting: Internal Medicine

## 2010-12-30 DIAGNOSIS — Z1231 Encounter for screening mammogram for malignant neoplasm of breast: Secondary | ICD-10-CM

## 2010-12-31 NOTE — Assessment & Plan Note (Signed)
OFFICE VISIT   Quesnell, Kim Donovan  DOB:  [birth year]                                        January 22, 2010  CHART #:  [id number]   HISTORY:  The patient returned to my office today for followup status  post aortic valve replacement with a 23-mm Edwards pericardial valve on  Dec 26, 2009.  She had an uncomplicated postoperative course.  She said  that since discharge, she has continued to improve and is now walking  without any chest pain or shortness of breath.  Her energy level  continues to improve.   PHYSICAL EXAMINATION:  Blood pressure is 136/86, pulse is 100 and  regular, and respiratory rate is 18, unlabored.  Oxygen saturation on  room air is 99%.  She looks well.  Cardiac exam shows regular rate and  rhythm with normal valve sounds.  There is no murmur.  Her lungs are  clear.  The chest incision is healing well and the sternum is stable.   DIAGNOSTIC TESTS:  Followup chest x-ray today shows clear lung fields  and trivial pleural effusion.   MEDICATIONS:  1. Zocor 20 mg daily.  2. Travatan eye drops.  3. Aspirin 81 mg daily.  4. Centrum Silver vitamin daily.  5. Fish oil 1000 mg daily.  6. Calcium plus vitamin D daily.  7. Dorzolamide/timolol eye drops.  8. Reclast.  9. Trigosamine p.r.n.   IMPRESSION:  Overall, the patient is making a great recovery following  aortic valve replacement.  I told her she can return to driving a car at  this time, but should refrain from lifting anything heavier than 10  pounds for a total of 3 months from date of surgery.  She will continue  to follow up with Dr. Arvilla Meres and contact me if she develops  any problems with her incision.   Evelene Croon, M.D.  Electronically Signed   BB/MEDQ  D:  01/22/2010  T:  01/23/2010  Job:  [id number]   cc:   Bevelyn Buckles. Bensimhon, MD  Pricilla Riffle, MD, [facility]  Neta Mends. Fabian Sharp, MD

## 2010-12-31 NOTE — Assessment & Plan Note (Signed)
HEALTHCARE                         GASTROENTEROLOGY OFFICE NOTE   NAME:BESSSola, Margolis                         MRN:          [id number]  DATE:01/13/2007                            DOB:          [birth year]    Ms. Zuk is a very nice, 77 year old patient of Dr. Fabian Sharp whose been  having increased heartburn. For the past 3 months, she has had burning  substernally mostly during the day as late as the evenings but the  symptoms have not been waking her up at night. She denies any dysphagia  or odynophagia. She has no previous history of gastroesophageal reflux.  She has not tried any acid-suppressing agents other than Mylanta which  seemed to be quite effective in relieving her symptoms. Ms. Jacquet denies  any change in her weight, any change in stress level. She drinks 2 cups  of coffee in the morning. She has taken Aleve occasionally about once a  month and she has been on aspirin 81 mg a day for the past 2 years.   The other medical problems include aortic stenosis evaluated by Dr.  Gala Romney moderately severe, microscopic hematuria, hyperlipidemia and  diverticulosis. She had a remote cholecystectomy and appendectomy. Her  last 2 colonoscopies were done in 2003 and 2005 and were normal. Her  next colonoscopy would be in 2015.   MEDICATIONS:  1. Multivitamin 1 daily.  2. Fish oil.  3. Simvastatin 20 mg p.o. daily.  4. Aspirin 81 mg p.o. daily.  5. Travatan eyedrops.  6. Calcium with vitamin D 1 b.i.d.   PAST MEDICAL HISTORY:  Significant for osteopenia, glaucoma in right  eye.   FAMILY HISTORY:  Negative for colon cancer. Father had heart disease and  stroke.   SOCIAL HISTORY:  Married, she has a Probation officer, she used to Associate Professor.  She does not smoke but drinks alcohol in small amounts daily.   REVIEW OF SYSTEMS:  Arthritic complaints, microscopic hematuria, heart  murmur.   PHYSICAL EXAMINATION:  VITAL SIGNS:  Blood pressure 142/80, pulse  70,  weight 146 pounds.  GENERAL:  She was alert and oriented, appeared younger than her stated  age.  HEENT:  Sclera is not icteric.  NECK:  Transmitted bruit from the precordium.  LUNGS:  Clear to auscultation.  COR:  2/6 systolic ejection murmur radiating to the carotids. Rhythm was  regular. Sternum and costochondral junctions were not tender.  ABDOMEN:  Soft, nontender with normal active bowel sounds. Epigastrium  was normal, right upper quadrant showed well healed post cholecystic  scar. Lower abdomen was unremarkable with no palpable tenderness or  mass.  RECTAL:  Normal rectal tone. Stool was Hemoccult negative.  EXTREMITIES:  No edema.   CT scan of the abdomen done on Jan 07, 2007 showed prominence of the  common bile duct consistent with post cholecystectomy state,  thoracolumbar DJD and tiny liver cysts of no clinical significance.   Her labs showed normal hemoglobin and hematocrit.   IMPRESSION:  19. A 77 year old white female with new onset of heartburn which does      not seem to  be progressive and does not seem to be associated with      other symptoms such as dysphagia, weight loss or occult GI blood      loss.  2. Functional constipation relieved by MiraLax which we have      recommended for her to take on a daily basis. I am not sure what      precipitated the heartburn. We have gone with her over her      medications as well as changes in her lifestyle, stress levels      awake. I do not seem to detect any change in her lifestyle which      would explain the heartburn which seems to be easily controlled      with antacids. Because of the mild nature of the symptoms and the      responsiveness to antacids, I do not feel that further evaluation      with endoscopy is warranted yet.   PLAN:  1. The patient may use Mylanta p.r.n. heartburn.  2. Prilosec 20 mg p.o. daily for a period of 4 weeks. After 4 weeks,      he may discontinue the Prilosec. If the symptoms  recur or continue      despite taking Prilosec, she will get in touch with [place] and I would      recommend upper endoscopy to rule out hiatal hernia and Barrett's      esophagus.  3. Continue MiraLax 17 grams every day or every other day which seems      to be satisfactory.  4. Next colonoscopy in 2005. I would be happy to see her in the future      for gastrointestinal problems.     Hedwig Morton. Juanda Chance, MD  Electronically Signed    DMB/MedQ  DD: 01/13/2007  DT: 01/13/2007  Job #: [id number]   cc:   Pricilla Riffle, MD, [facility]  Bevelyn Buckles. Bensimhon, MD  Neta Mends. Fabian Sharp, MD

## 2010-12-31 NOTE — Consult Note (Signed)
NEW PATIENT CONSULTATION   Peek, Keala E  DOB:  [birth year]                                        November 29, 2009  CHART #:  [id number]   REASON FOR CONSULTATION:  Severe aortic stenosis.   CLINICAL HISTORY:  I was asked by Dr. Gala Romney to evaluate the patient  for consideration of aortic valve placement.  She is a 77 year old woman  who is the aunt of Dr. Dietrich Pates who has been followed for worsening  aortic stenosis.  She has been asymptomatic.  She had an echocardiogram  done on June 18, 2009, which showed ejection fraction of 60% to 65%.  The aortic valve was calcified and severely restricted with peak and  mean gradients of 74 and 47 consistent with severe stenosis.  These had  increased significantly compared to her previous echocardiogram of 2008.  Her aortic valve area was calculated at 0.64-0.69 sq. cm.  She underwent  a repeat echocardiogram on November 05, 2009, which showed peak and mean  gradients of 86 and 51 mmHg respectively, which has increased from her  previous exam.  Her aortic valve area was calculated at 0.51-0.56 sq.  cm.  Left ventricular ejection fraction remained at 60% to 65%.  Left  ventricular systolic function was normal.  Wall motion was normal.  There was grade 1 diastolic dysfunction.  There was no significant  mitral regurgitation and mild-to-moderate mitral regurgitation with  normal right ventricular function.  She underwent cardiac  catheterization on November 26, 2009, which showed no coronary disease.  There was some calcification in the wall of the LAD.  PA pressure was  normal at 18/8 with a mean of 12.  Wedge pressure was 7.  Her cardiac  index was 3.1.   REVIEW OF SYSTEMS:  GENERAL:  She denies any fever or chills.  She has  had no recent weight changes.  She denies fatigue.  She remains very  active.  EYES:  Negative.  ENT:  She has had no dental problems.  She does see a dentist twice a  year.  ENDOCRINE:  She  denies diabetes and hypothyroidism.  CARDIOVASCULAR:  She denies any chest pain or pressure.  She has had no  exertional dyspnea.  She denies PND or orthopnea.  She has had no  palpitations.  She does have mild lower extremity edema at times.  RESPIRATORY:  She denies cough and sputum production.  GI:  She has had no nausea or vomiting.  She denies melena and bright  red blood per rectum.  GU:  She denies dysuria and hematuria.  MUSCULOSKELETAL:  She denies myalgias.  She does have arthritis.  VASCULAR:  She denies claudication and phlebitis.  She did have vein  sclerotherapy in her left lower leg.  NEUROLOGIC:  She denies any focal weakness or numbness.  She denies  dizziness and syncope.  She has never had a TIA or a stroke.  HEMATOLOGIC:  She has no history of bleeding disorders or easy bleeding.   ALLERGIES:  None.   MEDICATIONS:  1. Zocor 20 mg daily.  2. Travatan Z 0.004% one drop in right eye once daily.  3. Aspirin 81 mg daily.  4. Centrum Silver vitamin daily.  5. Fish oil 1000 mg daily.  6. Citracal/vitamin D 250/200 daily.  7. Dorzolamide/timolol 2/0.5% solution.  8. Reclast 5 mg/100 mL solution.  9. Trigosamine 2 tablets once daily.   PAST MEDICAL HISTORY:  Significant for mild hypertension when she visits  doctors.  She has history of aortic stenosis.  She is status post  hysterectomy and status post cholecystectomy.  She is status post left  lower leg vein sclerotherapy.   FAMILY HISTORY:  Positive for stroke in her father at age 38.   SOCIAL HISTORY:  She is married and retired.  She is a remote smoker but  quit in 1978.  She drinks occasional alcoholic drink.   PHYSICAL EXAMINATION:  Vital Signs:  Blood pressure is 151/89, pulse is  75 and regular, respiratory rate is 18 and unlabored.  Oxygen saturation  on room air is 98%.  General:  She is a well-developed elderly white  female in no distress.  HEENT:  Normocephalic and atraumatic.  Pupils  are equal and  reactive to light and accommodation.  Extraocular muscles  are intact.  Throat is clear.  Teeth are in good condition.  Neck:  Normal carotid pulses bilaterally.  There is a transmitted murmur to  both sides of the neck.  There is no adenopathy or thyromegaly.  Cardiac:  Regular rate and rhythm with a grade 3/6 systolic murmur over  the aorta.  Lungs:  Clear.  Abdomen:  Active bowel sounds.  Abdomen:  Soft and nontender.  There are no palpable masses or organomegaly.  Extremities:  No peripheral edema.  Pedal pulses are palpable  bilaterally.  Skin:  Warm and dry.  Neurologic:  Alert and oriented x3.  Motor and sensory exams are grossly normal.   IMPRESSION:  The patient has severe aortic stenosis that is worsening by  echocardiogram with an aortic valve area of 0.51-0.56 sq. cm by  echocardiogram.  She has normal coronary arteries and normal left  ventricular function.  I agree that it is best to proceed with aortic  valve replacement surgery despite the fact that she denies any symptoms.  I would plan to use a tissue valve given her age.  I have discussed the  pros and cons of mechanical and tissue valves and my recommendation is  for a tissue valve and she agrees.  I discussed the aortic valve  replacement procedure with her and her husband including alternatives,  benefits, and risks including but not limited to bleeding, blood  transfusion, infection, stroke, myocardial infarction, heart block  requiring permanent pacemaker, and death.  She understands all this and  agrees to proceed.  She will contact our office in the next week or so  to schedule surgery.  I recommended that this be performed in the next  few weeks if possible due to the severity of her stenosis.  She is in  agreement and will call us promptly.   Evelene Croon, M.D.  Electronically Signed   BB/MEDQ  D:  11/29/2009  T:  11/30/2009  Job:  [id number]   cc:   Bevelyn Buckles. Bensimhon, MD  Neta Mends. Fabian Sharp, MD  Pricilla Riffle, MD, [facility]

## 2010-12-31 NOTE — Assessment & Plan Note (Signed)
HEALTHCARE                            CARDIOLOGY OFFICE NOTE   NAME:Kim Donovan, Kim Donovan                         MRN:          [id number]  DATE:01/04/2007                            DOB:          November 27, 1928    PRIMARY CARE PHYSICIAN:  Kim Mends. Panosh, MD.   INTERVAL HISTORY:  Kim Donovan is a delightful, 77 year old woman who is  the aunt of Kim Donovan who returns for routine follow up.  Her past  medical history is notable for moderate aortic stenosis, hyperlipidemia,  borderline hypertension with questionable white coat syndrome and  chronic chest and abdominal pain.  She presents today for her routine  follow.  Kim Donovan tells me that she continues to have nearly constant  discomfort in the left side of her chest.  This can last all day and  does not change at all with exertion or other activities.  She says that  she walks 3 miles a day, on many days, without any difficulty.  She does  note that she occasionally gets a gas attack which doubled her over; and  she takes [place] which helps for quickly.  However, she has not had any  relief of her chest pain with GasX or an antacid.  She has a history of  gallstones and is status post cholecystectomy.   REVIEW OF SYSTEMS:  On review of system she denies any blood in her  stool, she denies any nausea, vomiting, or diarrhea.  She does have  constipation.  She has not had any melanotic.  She did take Nexium for  several weeks without any change in her chest discomfort.   She underwent treadmill Myoview during which time she walked for four  minutes in 30 seconds on a standard Bruce protocol before the technician  stopped her when she reached her target heart rate.  She feels that she  could have gone farther.  EF of 76%.  There was no ST-T wave  abnormalities.  Perfusion imaging was normal.   CURRENT MEDICATIONS:  Multivitamin, fish oil, Zocor 20, aspirin 81, and  Travatan eye drops.   PHYSICAL  EXAMINATION:  She appears younger than her stated age.  She  walks around the clinic briskly without any respiratory difficulty.  Blood pressure was 114/70 manually.  Her heart rate is 56, weight is  144.  HEENT:  Normal.  NECK:  Supple.  No JVD.  Carotids are 2+ bilaterally with bilateral  radiated bruits.  There is no lymphadenopathy or thyromegaly.  CARDIAC:  She has a regular rate and rhythm with a 2/6 made peeking  systolic ejection murmur at the right sternal border.  S2 is crisp and  not diminished.  There is no rub or gallop.  LUNGS:  Clear.  ABDOMEN:  Soft, nontender and nondistended.  There is no  hepatosplenomegaly.  There are no bruits, no masses.  Good bowel sounds.  EXTREMITIES:  Warm with no cyanosis clubbing or edema.  No rash.  Good  distal pulses.  NEUROLOGIC:  She is alert and oriented x3.  Cranial nerves II-XII are  intact.  Moves all four extremities without difficulty.  Affect is pleasant.   ASSESSMENT AND PLAN:  1. Ongoing chest pain.  This is quite atypical; and I doubt that it is      cardiac especially in light of her negative stress test and      preserved exercise tolerance.  However, its chronicity does bother      me.  We had a long talk about the possible workup modalities      including cardiac catheterization.  She is somewhat reluctant to      undergo catheterization.  At this point we will go ahead and get a      chest and abdominal CT to rule out any other occult process.  I      have also referred her to Kim Donovan to see if she can come up      with a GI source as the cause of her discomfort.  2. Aortic stenosis.  This appears moderate; and I do not think his      associated with any of her symptoms.  We will continue to follow      with yearly echocardiograms.  3. Hypertension is well controlled.   DISPOSITION:  Return to clinic in one to two months' for follow up.  We  will follow up with her regarding her imaging studies.     Kim Buckles. Bensimhon, MD  Electronically Signed    DRB/MedQ  DD: 01/04/2007  DT: 01/05/2007  Job #: [id number]   cc:   Kim Mends. Fabian Sharp, MD

## 2011-01-01 ENCOUNTER — Ambulatory Visit
Admission: RE | Admit: 2011-01-01 | Discharge: 2011-01-01 | Disposition: A | Discharge status: Home / Self Care | Payer: MEDICARE | Source: Ambulatory Visit | Attending: Internal Medicine | Admitting: Internal Medicine

## 2011-01-01 DIAGNOSIS — Z1231 Encounter for screening mammogram for malignant neoplasm of breast: Secondary | ICD-10-CM

## 2011-01-03 NOTE — Assessment & Plan Note (Signed)
The patient returns today for follow up of her cervical pain as well as low  back and pelvic pain.  She has improvement after placement of a heel lift on  the left side. The patient has worn Lidoderm patch only intermittently. She  states her neck pain is not significantly better.   PT reports her pain in her neck and low back have improved to 30% to 40% and  she is now independent with self-care techniques per pain control as well as  a home exercise program.   PHYSICAL EXAMINATION:  GENERAL: In no acute distress, pain level greater  than 3 to 4 out of 10.  Sleep is good. She has no impairment in functional  mobility.  VITAL SIGNS: Her blood pressure is 145/76, pulse 72, respirations 16, O2  saturation 98% on room air.  NEUROLOGIC: Mood and affect appropriate. Gait is normal. Without the heel  lift in she does have 1/4- to 3/8-inch leg length discrepancy, left being  shorter than the right. She has good forward flexion and extension of the  lumbar spine. She has obvious scoliosis.  NECK: She has a kyphotic head-forward posture, some tightness in bilateral  upper traps with reduced flexion, extension, lateral rotation, and bending  with 75% normal range. This is with the exception of lateral bending which  is only in the 25% range.   IMPRESSION:  1.  Chronic neck pain, cervical spondylosis, cervical myofascial pain.  2.  Low back and pelvic pain improved. She does have functional leg length      discrepancy with correction,  symptomatically improving left hip      contracture as well.   PLAN:  Will send her back to physical therapy for a TENS trial. She can use  a TENS unit for her neck pain during the day and then use Lidoderm patch at  night. If this is not successful, next step will be trigger point injections  as well as consideration for Limbrel 250 mg b.i.d.      Erick Colace, M.D.  Electronically Signed     AEK/MedQ  D:  12/02/2005 09:36:38  T:  12/02/2005  20:01:27  Job #:  [id number]   cc:   Neta Mends. Fabian Sharp, M.D. [facility]  [address] [place]  [place] [address]   Physical Therapy Jomarie Longs at [facility]

## 2011-01-03 NOTE — Assessment & Plan Note (Signed)
Ms. Luepke returns to day after I last saw her on December 02, 2005 with follow  up of cervical pain as well as low back and pelvic pain.  Overall  improvement with pelvic pain following use of heel lift.  Her neck pain is  not significantly better.  Tried some physical therapy for this but really  did not feel like it helped as much other than the message portion of it.  She has no arm radiating symptoms.  She has tried TENS a couple of times but  really has not helped, although she does not feel she was given an adequate  trial.  Her pain is 2 to 4 or even 2 to 5/10.  Relief from  medications is  fair to good.  She has no limitations of her extremities with walking, step  climbing or driving.  She is a fully functional in terms of her activities  of daily living.  Her review of systems is otherwise negative.   Her blood pressure was 167/86, pulse 76, respirations 17, O2 saturation 99%  on room air.  In general, no acute distress.  Mood and affect are  appropriate.  She is planning follow up with her primary physician.   Her neck has decreased extension, good forward flexion and 75% range of  lateral bending and rotation.  She has tightness of her trapezius  bilaterally.  She has normal strength in bilateral upper and lower  extremities.  Normal knee, ankle and hip range of motion.   IMPRESSION:  Cervicalgia due to cervical spondylosis, myofascial pain.   PLAN:  1.  I have given her treatment options including trigger point injection,      acupuncture and message therapy.  She would be interested in pursuing      acupuncture therapy, although she needs to think about this.  Also      message therapy she is interested in this and I have given her the      brochure for integrated therapies for this.  2.  She will trial the TENS and report back to me whether or not it has been      helpful.  If not, she will return to the unit.  3.  The patient will see me back on a p.r.n. basis.  4.   Medication wise she is just using some over-the-counter on a p.r.n.      basis and really not with any regularity.      Erick Colace, M.D.  Electronically Signed     AEK/MedQ  D:  12/30/2005 14:19:33  T:  12/31/2005 00:20:51  Job #:  [id number]   cc:   Neta Mends. Fabian Sharp, M.D. [facility]  [address] [place]  [place] [address]

## 2011-01-03 NOTE — Assessment & Plan Note (Signed)
[facility]                            CARDIOLOGY OFFICE NOTE   NAME:[name], [name]                         MRN:          [id number]  DATE:11/25/2006                            DOB:          01-22-1929    PRIMARY CARE PHYSICIAN:  Dr. Berniece Andreas.   INTERVAL HISTORY:  Kim Donovan is a delightful, 77 year old woman who is  the aunt of Dr. Dietrich Pates who returns for routine followup.   PAST MEDICAL HISTORY:  Mild to moderate aortic stenosis, hyperlipidemia  and borderline hypertension with questionable white coat syndrome. For  complete past medical history, please see my note of November 17, 2005.   Ms. Kim Donovan returns today for routine followup. Overall, she is doing  fairly well. However, over the past several weeks, she has noticed a  constant discomfort in the left side of her chest. This pretty much is  there all the time and does not change at all with exertion or other  activities. She walks about 3 miles a day at a brisk pace without any  difficulty. She has not noticed any change in her exercise capacity. She  denies any radiation, any shortness of breath, no diaphoresis, nausea or  vomiting. She did take some Tums but this did not help. She denies any  acid reflux symptoms. She had a repeat echocardiogram last week which  showed an ejection fraction of 60%. The aortic valve looked very  thickened. The mean gradient was at 24 mmHg which was up from last year  of 13. This was read as moderate to severe aortic stenosis but probably  more moderate. There was also mild to moderate tricuspid regurgitation.   REVIEW OF SYSTEMS:  She denies any syncope, presyncope or lower  extremity edema. She has been taking her blood pressure routinely and it  has been registering from 115 to 135 at home.   CURRENT MEDICATIONS:  1. Multivitamin.  2. Fish oil.  3. Simvastatin 20.  4. Aspirin 81.  5. Travatan eye drops.   PHYSICAL EXAMINATION:  GENERAL:  She is  well-appearing, looks younger  than her stated age. She ambulates around the clinic without any  respiratory distress.  VITAL SIGNS:  Blood pressure initially was 178/98, on recheck 158/90.  Her weight is 147 pounds. Pulse is 69.  HEENT:  Sclera is anicteric. EOMI. Had no xanthelasmas. Mucous membranes  are moist. Oropharynx is clear.  NECK:  Supple. There is no JVD. Carotids are 2+ bilaterally with  bilateral radiated bruits. There is no lymphadenopathy or thyromegaly.  CARDIAC:  Regular rate and rhythm with a 2/6 mid peaking sytolic  ejection murmur at the right sternal border. S2 is not significantly  diminished. There is no rub or gallop.  LUNGS:  Clear.  ABDOMEN:  Soft, nontender, nondistended. There is no hepatosplenomegaly,  no bruits, no masses. The abdominal aortic impulse is not widened.  EXTREMITIES:  Warm with no cyanosis or clubbing. There is minimal edema  in the left lower extremity at the site of her recent varicose vein  work. There is no rash, good distal  pulses.  NEUROLOGIC:  Alert and oriented x3. Cranial nerves II-XII are intact.  Moves all 4 extremities without difficulty.   EKG shows normal sinus rhythm at a rate of 69 with an incomplete right  bundle branch block and no significant ST-T wave abnormalities.   ASSESSMENT/PLAN:  1. Chest pain. This is atypical, however, I do think it is reasonable      to proceed with a treadmill Myoview to further evaluate. We are      also going to get a chest x-ray.  2. Aortic stenosis. Her mean gradient has increased somewhat over the      past year; however, by symptoms and current gradient, her aortic      stenosis is certainly in the moderate range. Given the progression      over the past year, I think it is reasonable to repeat the      echocardiogram in 6 months just to make sure things are stable.      Would not proceed with surgical evaluation at this point given her      excellent functional capacity.  3.  Hypertension. It appears she has a significant white coat      component. I have asked her to keep a blood pressure log for me at      home and bring this in for evaluation.   DISPOSITION:  Will see her back in 6 weeks for routine followup. Of  note, we will also start her on a proton pump inhibitor to see if this  will provide her any relief with her discomfort.     Bevelyn Buckles. Bensimhon, MD  Electronically Signed    DRB/MedQ  DD: 11/25/2006  DT: 11/25/2006  Job #: [id number]   cc:   Neta Mends. Fabian Sharp, MD

## 2011-01-03 NOTE — Group Therapy Note (Signed)
REASON FOR REFERRAL:  Neck pain, back pain and groin pain.   HISTORY OF PRESENT ILLNESS:  This is a 77 year old female who moved from  [place] to [place] approximately one year ago.  She states she  has had cervical spondylosis diagnosed 3-4 years ago, but some symptoms in  that area for some years prior to that.  She states her neck is more of an  aching pain averaging about 4/10, worse with movement of her neck.  Pain  does improve with heat as well as Alleve.  In addition, she has bilateral  groin pain which she notes after prolonged sitting.  States no bowel or  bladder issues.  Once she gets up and walks about, it does help.  In  addition, she has low back pain and mid back pain onset about 20 years ago.  She thinks that as a teenager, her sister thought that she walked funny  and wonders whether she may have been developing a curvature at that time.  About 25 years ago, her family's physician did notice a curvature of her  spine.  She has no lower extremity symptoms other than in her hip area.  She  also has some right buttock pain.  She is able to climb steps.  She drives.  She walks without assistance.  She is independent with all of her self care,  mobility and can do all her household duties independently.  She is retired.   PAST MEDICAL HISTORY:  1.  She has arthritis in her hands.  2.  She has been recently diagnosed with aortic stenosis and plans with      cardiology follow up soon, in the next month.   PAST SURGICAL HISTORY:  1.  Tonsillectomy 1939.  2.  Cholecystectomy 1958.  3.  Partial mastectomy 1985.  4.  Hysterectomy 2003.   SOCIAL HISTORY:  Married, lives with her husband, drinks about one drink per  day.   IMAGING STUDIES:  I reviewed some films done in the chiropractic office here  locally showing thoracolumbar spine films, rightward scoliosis from T3-L1  and around 25 degrees, and a compensatory thoracolumbar scoliosis of 15-20  degrees,  convex leftward.  Probable T7 old compression fracture.  Lateral  films show a grade I spondylolisthesis L4 on L5.   Cervical films were not available.  However, cervical spine x-rays taken in  [place] dated March 20, 2003, demonstrated moderate to marked degenerative  spondylosis seen throughout with moderate to marked degenerative disk  disease C4-5, C5-6, C6-7 and slight anterolisthesis on C3-4.  Facet joint  spurring seems well.   MEDICATIONS:  1.  Fosamax.  2.  Alleve at night, but only about once a week.   PHYSICAL EXAMINATION:  GENERAL APPEARANCE:  Pleasant female appearing  younger than her stated age.  No acute distress.  Mood and affect bright,  appropriate.  VITAL SIGNS:  Blood pressure 133/89, pulse 87, respirations 16, O2  saturation 98% on room air.  NECK:  Some prominent scalene musculature bilaterally.  She has poor forward  flexion, but only 0-25% of lateral bending bilaterally.  She has also  reduced neck extension.  NEUROLOGICAL:  She has some tenderness to palpation over the scalene  musculature bilaterally and less so at the upper trapezius.  She has full  strength at the deltoid and biceps grip.  Her spine has obvious dextroconvex  scoliosis in the thoracic area.  She has forward flexion/extension of the  lumbar spine.  She has  normal sensation in both upper and lower extremities.  She has normal strength in bilateral lower extremities at the hip flexors,  knee extensors, ankle dorsiflexors, plantar flexors.   She has reduced range of motion in the left hip with external rotation.  EXTREMITIES:  Her extremities show no cyanosis, clubbing or edema with the  exception of right lateral distal leg showing discoloration, dark red, but  no ulceration.  Favors test showed mild pain in the right PSIS area.   IMPRESSION:  1.  Chronic neck pain with cervical spondylosis as well as cervical      myofascial pain, particularly scalene musculature.  2.  Left hip  contracture.  This is multifactorial and may be related to      scoliosis and some functional leg discrepancy.  3.  Thoracolumbar scoliosis, congenital versus acquired.  4.  Possible right SI joint arthroscopy.  5.  L4-5 and C3 on C4 spondylolisthesis.  Do not seem to be symptomatic      here.  6.  Osteoporosis on Fosamax with prior evidence of thoracic compression      fracture which is asymptomatic in terms of pain at this point.   RECOMMENDATIONS:  1.  The patient would like to minimize medication usage which I think is      appropriate in this situation.  Will give her Lidoderm patch for her      neck and low back to be used at night from 7 p.m. to 7 a.m.  2.  Referred to physical therapy at [facility] location.      Evaluate for leg length discrepancy.  Fit for heel lift if needed.  Do      myofascial release in the upper neck musculature.  Range of motion left      hip.  Progress to home exercise program.  Possible SI joint mobilization      as well on the right side.  3.  I will see her back in six weeks.  We will discuss other treatment      options including __________ b.i.d. if needed versus trigger point      injection of the cervical spine.      Erick Colace, M.D.  Electronically Signed     AEK/MedQ  D:  10/23/2005 16:42:07  T:  10/24/2005 10:59:37  Job #:  [id number]   cc:   Neta Mends. Fabian Sharp, M.D. [facility]  [address] [place]  [place] [address]

## 2011-01-17 HISTORY — PX: EYE SURGERY: SHX253

## 2011-03-10 ENCOUNTER — Other Ambulatory Visit: Payer: Self-pay | Admitting: Diagnostic Radiology

## 2011-03-12 ENCOUNTER — Ambulatory Visit
Admission: RE | Admit: 2011-03-12 | Discharge: 2011-03-12 | Disposition: A | Discharge status: Home / Self Care | Payer: MEDICARE | Source: Ambulatory Visit | Attending: Diagnostic Radiology | Admitting: Diagnostic Radiology

## 2011-03-12 ENCOUNTER — Other Ambulatory Visit: Payer: Self-pay | Admitting: Diagnostic Radiology

## 2011-03-12 ENCOUNTER — Other Ambulatory Visit (HOSPITAL_COMMUNITY): Payer: Self-pay | Admitting: Diagnostic Radiology

## 2011-03-12 DIAGNOSIS — R600 Localized edema: Secondary | ICD-10-CM

## 2011-04-24 ENCOUNTER — Ambulatory Visit (INDEPENDENT_AMBULATORY_CARE_PROVIDER_SITE_OTHER): Payer: MEDICARE | Admitting: Internal Medicine

## 2011-04-24 DIAGNOSIS — Z23 Encounter for immunization: Secondary | ICD-10-CM

## 2011-04-30 ENCOUNTER — Other Ambulatory Visit (HOSPITAL_COMMUNITY): Payer: Self-pay | Admitting: Diagnostic Radiology

## 2011-04-30 DIAGNOSIS — R600 Localized edema: Secondary | ICD-10-CM

## 2011-05-08 ENCOUNTER — Ambulatory Visit (HOSPITAL_COMMUNITY)
Admission: RE | Admit: 2011-05-08 | Discharge: 2011-05-08 | Disposition: A | Discharge status: Home / Self Care | Payer: MEDICARE | Source: Ambulatory Visit | Attending: Diagnostic Radiology | Admitting: Diagnostic Radiology

## 2011-05-08 DIAGNOSIS — R0989 Other specified symptoms and signs involving the circulatory and respiratory systems: Secondary | ICD-10-CM | POA: Insufficient documentation

## 2011-05-13 ENCOUNTER — Telehealth: Payer: Self-pay | Admitting: Emergency Medicine

## 2011-05-16 ENCOUNTER — Encounter: Payer: Self-pay | Admitting: Internal Medicine

## 2011-06-02 NOTE — Telephone Encounter (Signed)
SEE ABOVE TELEPHONE NOTE

## 2011-06-09 ENCOUNTER — Ambulatory Visit (INDEPENDENT_AMBULATORY_CARE_PROVIDER_SITE_OTHER): Payer: MEDICARE | Admitting: Internal Medicine

## 2011-06-09 ENCOUNTER — Encounter: Payer: Self-pay | Admitting: Internal Medicine

## 2011-06-09 DIAGNOSIS — I350 Nonrheumatic aortic (valve) stenosis: Secondary | ICD-10-CM

## 2011-06-09 DIAGNOSIS — R350 Frequency of micturition: Secondary | ICD-10-CM | POA: Insufficient documentation

## 2011-06-09 DIAGNOSIS — E785 Hyperlipidemia, unspecified: Secondary | ICD-10-CM

## 2011-06-09 DIAGNOSIS — M810 Age-related osteoporosis without current pathological fracture: Secondary | ICD-10-CM

## 2011-06-09 DIAGNOSIS — I359 Nonrheumatic aortic valve disorder, unspecified: Secondary | ICD-10-CM

## 2011-06-09 DIAGNOSIS — R2689 Other abnormalities of gait and mobility: Secondary | ICD-10-CM

## 2011-06-09 DIAGNOSIS — M1990 Unspecified osteoarthritis, unspecified site: Secondary | ICD-10-CM

## 2011-06-09 DIAGNOSIS — I872 Venous insufficiency (chronic) (peripheral): Secondary | ICD-10-CM

## 2011-06-09 DIAGNOSIS — M353 Polymyalgia rheumatica: Secondary | ICD-10-CM

## 2011-06-09 DIAGNOSIS — Z79899 Other long term (current) drug therapy: Secondary | ICD-10-CM

## 2011-06-09 DIAGNOSIS — H409 Unspecified glaucoma: Secondary | ICD-10-CM

## 2011-06-09 DIAGNOSIS — I10 Essential (primary) hypertension: Secondary | ICD-10-CM

## 2011-06-09 DIAGNOSIS — R5383 Other fatigue: Secondary | ICD-10-CM

## 2011-06-09 DIAGNOSIS — Z0000 Encounter for general adult medical examination without abnormal findings: Secondary | ICD-10-CM

## 2011-06-09 DIAGNOSIS — R5381 Other malaise: Secondary | ICD-10-CM

## 2011-06-09 LAB — TSH: TSH: 0.99 u[IU]/mL (ref 0.35–5.50)

## 2011-06-09 LAB — CBC WITH DIFFERENTIAL/PLATELET
Basophils Absolute: 0 10*3/uL (ref 0.0–0.1)
Eosinophils Relative: 1.9 % (ref 0.0–5.0)
HCT: 38.9 % (ref 36.0–46.0)
Lymphs Abs: 3.6 10*3/uL (ref 0.7–4.0)
Monocytes Absolute: 0.7 10*3/uL (ref 0.1–1.0)
Monocytes Relative: 7.2 % (ref 3.0–12.0)
Neutrophils Relative %: 51.7 % (ref 43.0–77.0)
Platelets: 252 10*3/uL (ref 150.0–400.0)
RDW: 13.8 % (ref 11.5–14.6)
WBC: 9.2 10*3/uL (ref 4.5–10.5)

## 2011-06-09 LAB — HEPATIC FUNCTION PANEL
ALT: 20 U/L (ref 0–35)
AST: 27 U/L (ref 0–37)
Bilirubin, Direct: 0 mg/dL (ref 0.0–0.3)
Total Bilirubin: 0.7 mg/dL (ref 0.3–1.2)

## 2011-06-09 LAB — POCT URINALYSIS DIPSTICK
Bilirubin, UA: NEGATIVE
Glucose, UA: NEGATIVE
Spec Grav, UA: 1.015
pH, UA: 7.5

## 2011-06-09 LAB — LIPID PANEL
Cholesterol: 264 mg/dL — ABNORMAL HIGH (ref 0–200)
Total CHOL/HDL Ratio: 2
Triglycerides: 76 mg/dL (ref 0.0–149.0)
VLDL: 15.2 mg/dL (ref 0.0–40.0)

## 2011-06-09 LAB — BASIC METABOLIC PANEL
BUN: 21 mg/dL (ref 6–23)
Creatinine, Ser: 0.8 mg/dL (ref 0.4–1.2)
GFR: 75.49 mL/min (ref >60.00)
Glucose, Bld: 93 mg/dL (ref 70–99)

## 2011-06-09 NOTE — Patient Instructions (Addendum)
Will notify you  of labs when available. Call if you wish to  Proceed with Balance referral  . follow up depending on labs or 6 -12 months

## 2011-06-09 NOTE — Assessment & Plan Note (Signed)
Off simvastatin at this point.

## 2011-06-09 NOTE — Progress Notes (Signed)
Subjective:    Patient ID: Kim Donovan, female    DOB: 19-Feb-1929, 77 y.o.   MRN: [id number]  HPI  Patient comes in today for  Medicare wellness  visit and follow-up of medical issues. Update of her history since her last visit.  Has been on pred since dx of pmr since 12 11  LIPIDS: off simvastatin  Surgeries 4 12 cataract and glaucoma right eye  5 12 CTS surgery left hand  Concerns today   Fatigue : ? If related to prednisone    Better than before  ams are tough.  More frequent urination.  Am shoulder stiffness.   Dr Dierdre Forth seen in spring.  Balance: feels wobbly  When outside .  Last  Blood tests  From specialist s  CV: cardiology follow up.   To get echo soon no cp sob.  VV:   Some swelling and had le study veins no intervention  tiches and using stockings .  Vision glaucoma and had cataract surgery 4 12     Hearing:  Ok   Vision:  See above   Safety:  Has smoke detector and wears seat belts.  No firearms. No excess sun exposure. Sees dentist regularly.  Falls:  None   Advance directive :  Reviewed  Has one.  Memory: Felt to be good  , no concern from her or her family.  Depression: No anhedonia unusual crying or depressive symptoms  Nutrition: Eats well balanced diet; adequate calcium and vitamin D. No swallowing chewiing problems.  Injury: no major injuries in the last six months.  Other healthcare providers:  Reviewed today .  Social:  Lives with husband married. No pets.   Preventive parameters: up-to-date on colonoscopy, mammogram, immunizations. Including Tdap and pneumovax.  ADLS:   There are no problems or need for assistance  driving, feeding, obtaining food, dressing, toileting and bathing, managing money using phone. She is independent.  She is not driving now    Review of Systems ROS:  GEN/ HEENTNo fever, significant weight changes sweats headaches vision problems  Except as above hearing changes, CV/ PULM; No chest pain shortness of  breath cough, syncope,edema  change in exercise tolerance. GI /GU: No adominal pain, vomiting, change in bowel habits. No blood in the stool. No significant GU symptoms. SKIN/HEME: ,no acute skin rashes suspicious lesions or bleeding. No lymphadenopathy, nodules, masses.  NEURO/ PSYCH:  No neurologic signs such as weakness numbness No depression anxiety. IMM/ Allergy: No unusual infections.    REST of 12 system review negative eacept as per hpi  Past history family history social history reviewed in the electronic medical record.      Objective:   Physical Exam Physical Exam: Vital signs reviewed NGE:XBMW is a well-developed well-nourished alert cooperative  white female who appears her stated age in no acute distress.  HEENT: normocephalic  traumatic , Eyes: PERRL EOM's full, conjunctiva clear, Nares: paten,t no deformity discharge or tenderness., Ears: no deformity EAC's clear TMs with normal landmarks. Mouth: clear OP, no lesions, edema.  Moist mucous membranes. Dentition in adequate repair. NECK: supple without masses, thyromegaly or bruits. CHEST/PULM:  Clear to auscultation and percussion breath sounds equal no wheeze , rales or rhonchi. No chest wall deformities or tenderness. Well healed scarr Breast: normal by inspection . No dimpling, discharge, masses, tenderness or discharge . CV: PMI is nondisplaced, S1 S2 no gallops, murmurs, rubs. Peripheral pulses are full without delay.No JVD .  ABDOMEN: Bowel sounds normal nontender  No  guard or rebound, no hepato splenomegal no CVA tenderness.  No hernia. Extremtities:  No clubbing cyanosis  no acute joint swelling or redness no focal atrophy OA changes hand and fingers  Chronic venous changes legs left fmore than right  No ulcers  Some color changes . NEURO:  Oriented x3, cranial nerves 3-12 appear to be intact, no obvious focal weakness,gait within normal limits no abnormal reflexes or asymmetrical wearing low dress shoes .  SKIN: No  acute rashes normal turgor, color, no bruising or petechiae. PSYCH: Oriented, good eye contact, no obvious depression anxiety, cognition and judgment appear normal. LN: no cervical axillary inguinal adenopathy     Assessment & Plan:  Preventive Health Care Continue lifestyle intervention healthy eating and exercise . No live vaccines at tis point. Up to date  on healthcare parameters per hx  But  hasnt had zostavax  Is on pred  And not indicated at this point  .   Had mammo 5 12 and dexa 4 2012  Fatigue  prob multifactorial  R/o metabolic      PMR  Under care Rheum Dr Dierdre Forth HT  stable AS/  AVR  no  change Urinary frequency  Get ua today r/o  Infection.  Venous insufficincy  No hx of rx no acute changes  Medicare Attestation I have personally reviewed: The patient's medical and social history Their use of alcohol, tobacco or illicit drugs Their current medications and supplements The patient's functional ability including ADLs,fall risks, home safety risks, cognitive, and hearing and visual impairment Diet and physical activities Evidence for depression or mood disorders  The patient's weight, height, BMI, and visual acuity have been recorded in the chart.  I have made referrals, counseling, and provided education to the patient based on review of the above and I have provided the patient with a written personalized care plan for preventive services.

## 2011-06-12 ENCOUNTER — Telehealth: Payer: Self-pay | Admitting: *Deleted

## 2011-06-12 DIAGNOSIS — R8290 Unspecified abnormal findings in urine: Secondary | ICD-10-CM

## 2011-06-12 NOTE — Telephone Encounter (Signed)
Pt aware of results 

## 2011-06-12 NOTE — Telephone Encounter (Signed)
Message copied by Romualdo Bolk on Thu Jun 12, 2011  2:36 PM ------      Message from: [facility], [place] [facility]      Created: Tue Jun 10, 2011  6:30 PM       Advise patient that her labs are normal except  Poss some blood in urine and total lipids are elevated as well as good cholesterol.             Her ESR slightly elevated.   Nothing new to explain the fatigue.      advise we  Get urinalysis with micro  At the [facility] office and culture if abnormal. ( please arrange this)      Send copy of labs to her Rheum Dr Dierdre Forth and also to her.

## 2011-06-13 ENCOUNTER — Other Ambulatory Visit (INDEPENDENT_AMBULATORY_CARE_PROVIDER_SITE_OTHER): Payer: MEDICARE

## 2011-06-13 DIAGNOSIS — R8290 Unspecified abnormal findings in urine: Secondary | ICD-10-CM

## 2011-06-13 DIAGNOSIS — R82998 Other abnormal findings in urine: Secondary | ICD-10-CM

## 2011-06-13 LAB — POCT URINALYSIS DIPSTICK
Bilirubin, UA: NEGATIVE
Glucose, UA: NEGATIVE
Leukocytes, UA: NEGATIVE
Nitrite, UA: NEGATIVE
Urobilinogen, UA: 0.2

## 2011-06-15 ENCOUNTER — Encounter: Payer: Self-pay | Admitting: Internal Medicine

## 2011-06-15 LAB — URINE CULTURE: Colony Count: 1000

## 2011-06-19 ENCOUNTER — Other Ambulatory Visit (INDEPENDENT_AMBULATORY_CARE_PROVIDER_SITE_OTHER): Payer: MEDICARE

## 2011-06-19 ENCOUNTER — Telehealth: Payer: Self-pay | Admitting: *Deleted

## 2011-06-19 DIAGNOSIS — R350 Frequency of micturition: Secondary | ICD-10-CM

## 2011-06-19 LAB — URINALYSIS, ROUTINE W REFLEX MICROSCOPIC
Leukocytes, UA: NEGATIVE
Urine Glucose: NEGATIVE
Urobilinogen, UA: 0.2 (ref 0.0–1.0)

## 2011-06-19 NOTE — Telephone Encounter (Signed)
Message copied by Romualdo Bolk on Thu Jun 19, 2011  8:18 AM ------      Message from: [facility], [place] [facility]      Created: Tue Jun 17, 2011  9:30 AM       Tell patient that her urine show insignificant growth of bacteria and not a UTI.        At her convenience please arrange  Urinalysis and microscopic at  [facility].

## 2011-06-19 NOTE — Telephone Encounter (Signed)
Pt aware of results 

## 2011-06-27 ENCOUNTER — Other Ambulatory Visit: Payer: Self-pay | Admitting: Internal Medicine

## 2011-06-27 DIAGNOSIS — R8290 Unspecified abnormal findings in urine: Secondary | ICD-10-CM

## 2011-06-30 ENCOUNTER — Other Ambulatory Visit (INDEPENDENT_AMBULATORY_CARE_PROVIDER_SITE_OTHER): Payer: MEDICARE

## 2011-06-30 DIAGNOSIS — R8290 Unspecified abnormal findings in urine: Secondary | ICD-10-CM

## 2011-06-30 DIAGNOSIS — R82998 Other abnormal findings in urine: Secondary | ICD-10-CM

## 2011-07-01 LAB — URINALYSIS, ROUTINE W REFLEX MICROSCOPIC
Bilirubin Urine: NEGATIVE
Ketones, ur: NEGATIVE
Specific Gravity, Urine: 1.01 (ref 1.000–1.030)
Urine Glucose: NEGATIVE
Urobilinogen, UA: 0.2 (ref 0.0–1.0)

## 2011-07-02 ENCOUNTER — Encounter: Payer: Self-pay | Admitting: Internal Medicine

## 2011-07-02 ENCOUNTER — Ambulatory Visit (HOSPITAL_COMMUNITY): Payer: MEDICARE | Attending: Cardiology | Admitting: Radiology

## 2011-07-02 ENCOUNTER — Ambulatory Visit (INDEPENDENT_AMBULATORY_CARE_PROVIDER_SITE_OTHER): Payer: MEDICARE | Admitting: Internal Medicine

## 2011-07-02 ENCOUNTER — Other Ambulatory Visit (HOSPITAL_COMMUNITY): Payer: Self-pay | Admitting: Internal Medicine

## 2011-07-02 VITALS — BP 124/82 | HR 65 | Ht 66.0 in | Wt 146.1 lb

## 2011-07-02 DIAGNOSIS — E785 Hyperlipidemia, unspecified: Secondary | ICD-10-CM | POA: Insufficient documentation

## 2011-07-02 DIAGNOSIS — I359 Nonrheumatic aortic valve disorder, unspecified: Secondary | ICD-10-CM

## 2011-07-02 DIAGNOSIS — I079 Rheumatic tricuspid valve disease, unspecified: Secondary | ICD-10-CM | POA: Insufficient documentation

## 2011-07-02 DIAGNOSIS — I350 Nonrheumatic aortic (valve) stenosis: Secondary | ICD-10-CM

## 2011-07-02 DIAGNOSIS — I080 Rheumatic disorders of both mitral and aortic valves: Secondary | ICD-10-CM | POA: Insufficient documentation

## 2011-07-02 NOTE — Progress Notes (Signed)
    Patient ID: Kim Donovan, female    DOB: 10/14/1928, 77 y.o.   MRN: [id number]  HPI  Ms. Novell is a delightful, 77 year old woman who is the aunt of Dr. Dietrich Pates. She has a history of aortic stenosis and is s/p bioprostheitc AVR on 12/26/09.  Remainder of her past medical history is notable for borderline hypertension with questionable white coat syndrome, PMR followed by Dr. Dierdre Forth. Has been on prednisone since December 2011 (now 9 mg/day).   Here for routine f/u. In the morning and night has lots of aches but improved on prednisone. During day does fine. Still very active. Walks every day - no exertional symptoms. No edema. No palpitations, CP or dyspnea.   BP very well controlled at home. BP log with systolics 100-120.   Echo today EF 55-60%. Grade 2 diastolic dysfunction. AVR looks fine.   Denies depression. Tried 2 tabs of Zoloft in past and couldn't tolerate.   Recent lipids (followed by Dr. Fabian Sharp)  Lab Results  Component Value Date   CHOL 264* 06/09/2011   HDL 117.90 06/09/2011   LDLCALC 77 06/07/2010   LDLDIRECT 125.4 06/09/2011   TRIG 76.0 06/09/2011   CHOLHDL 2 06/09/2011     Review of Systems  All other systems normal except as listed in the HPI and Problem List.   Past Medical History  Diagnosis Date  . Hyperlipidemia   . Hypertension   . Osteoarthritis   . Osteoporosis/osteopenia increased risk   . Glaucoma   . PVD (peripheral vascular disease)   . Aortic stenosis     myoview neg ischemia 11/2006  ---- echo 11/10: mean AVA gradient~47   ---- s/p bioprosthetic AVR 12/2009  . Polymyalgia rheumatica 11/05/2010     Physical Exam Filed Vitals:   07/02/11 0946  BP: 132/80  Pulse: 65     General:  Well appearing. no resp difficulty HEENT: normal Neck: supple. no JVD. Carotids 2+ bilat; no bruits. No lymphadenopathy or thryomegaly appreciated. Cor: PMI nondisplaced. Regular rate & rhythm. No rubs, gallops. Soft systolic ejection murmur over RSB Lungs:  clear Abdomen: soft, nontender, nondistended. No hepatosplenomegaly. No bruits or masses. Good bowel sounds. Extremities: no cyanosis, clubbing, rash, edema. no hot joints or rash. Neuro: alert & orientedx3, cranial nerves grossly intact. moves all 4 extremities w/o difficulty. affect pleasant   ECG: NSR 65. No ST-T wave abnormalities.

## 2011-07-02 NOTE — Patient Instructions (Signed)
Your physician wants you to follow-up in: 9  Months with Dr. Gala Romney at the Heart failure clinic. You will receive a reminder letter in the mail two months in advance. If you don't receive a letter, please call our office to schedule the follow-up appointment.

## 2011-07-02 NOTE — Assessment & Plan Note (Signed)
Valve looks great. Continue routine f/u. Reinforced need for SBE prophylaxis.

## 2011-07-02 NOTE — Assessment & Plan Note (Signed)
Lipids look fine. LDL mildly elevated at 125 (on steroids) but HDL 117!!! Would not start lipid-lowering therapy at this point.

## 2011-07-04 ENCOUNTER — Other Ambulatory Visit: Payer: Self-pay | Admitting: Internal Medicine

## 2011-07-04 DIAGNOSIS — R8290 Unspecified abnormal findings in urine: Secondary | ICD-10-CM

## 2011-07-04 NOTE — Progress Notes (Signed)
Quick Note:  Pt aware of results. Order placed in epic. ______

## 2011-07-22 ENCOUNTER — Other Ambulatory Visit (INDEPENDENT_AMBULATORY_CARE_PROVIDER_SITE_OTHER): Payer: MEDICARE

## 2011-07-22 DIAGNOSIS — R82998 Other abnormal findings in urine: Secondary | ICD-10-CM

## 2011-07-22 DIAGNOSIS — R8290 Unspecified abnormal findings in urine: Secondary | ICD-10-CM

## 2011-07-22 LAB — URINALYSIS, ROUTINE W REFLEX MICROSCOPIC
Bilirubin Urine: NEGATIVE
Nitrite: NEGATIVE
Total Protein, Urine: NEGATIVE
Urine Glucose: NEGATIVE
pH: 7.5 (ref 5.0–8.0)

## 2011-07-31 NOTE — Progress Notes (Signed)
Quick Note:  Pt aware of results. ______ 

## 2011-08-26 ENCOUNTER — Encounter (HOSPITAL_BASED_OUTPATIENT_CLINIC_OR_DEPARTMENT_OTHER): Payer: Self-pay | Admitting: *Deleted

## 2011-08-26 ENCOUNTER — Other Ambulatory Visit: Payer: Self-pay | Admitting: Orthopedic Surgery

## 2011-08-26 NOTE — Progress Notes (Signed)
Advised patient to stop Aspirin and fish oil until after surgery. 

## 2011-08-29 ENCOUNTER — Encounter (HOSPITAL_BASED_OUTPATIENT_CLINIC_OR_DEPARTMENT_OTHER): Payer: Self-pay | Admitting: Anesthesiology

## 2011-08-29 ENCOUNTER — Ambulatory Visit (HOSPITAL_BASED_OUTPATIENT_CLINIC_OR_DEPARTMENT_OTHER)
Admission: RE | Admit: 2011-08-29 | Discharge: 2011-08-29 | Disposition: A | Discharge status: Home / Self Care | Payer: MEDICARE | Source: Ambulatory Visit | Attending: Orthopedic Surgery | Admitting: Orthopedic Surgery

## 2011-08-29 ENCOUNTER — Encounter (HOSPITAL_BASED_OUTPATIENT_CLINIC_OR_DEPARTMENT_OTHER)
Admission: RE | Disposition: A | Discharge status: Home / Self Care | Payer: Self-pay | Source: Ambulatory Visit | Attending: Orthopedic Surgery

## 2011-08-29 ENCOUNTER — Encounter (HOSPITAL_BASED_OUTPATIENT_CLINIC_OR_DEPARTMENT_OTHER): Payer: Self-pay

## 2011-08-29 ENCOUNTER — Ambulatory Visit (HOSPITAL_BASED_OUTPATIENT_CLINIC_OR_DEPARTMENT_OTHER): Payer: MEDICARE | Admitting: Anesthesiology

## 2011-08-29 DIAGNOSIS — F3289 Other specified depressive episodes: Secondary | ICD-10-CM | POA: Insufficient documentation

## 2011-08-29 DIAGNOSIS — G5600 Carpal tunnel syndrome, unspecified upper limb: Secondary | ICD-10-CM | POA: Insufficient documentation

## 2011-08-29 DIAGNOSIS — I10 Essential (primary) hypertension: Secondary | ICD-10-CM | POA: Insufficient documentation

## 2011-08-29 DIAGNOSIS — F329 Major depressive disorder, single episode, unspecified: Secondary | ICD-10-CM | POA: Insufficient documentation

## 2011-08-29 DIAGNOSIS — M353 Polymyalgia rheumatica: Secondary | ICD-10-CM | POA: Insufficient documentation

## 2011-08-29 HISTORY — PX: CARPAL TUNNEL RELEASE: SHX101

## 2011-08-29 SURGERY — CARPAL TUNNEL RELEASE
Anesthesia: General | Laterality: Right

## 2011-08-29 MED ORDER — CHLORHEXIDINE GLUCONATE 4 % EX LIQD: 60.0000 mL | Freq: Once | CUTANEOUS | Status: DC

## 2011-08-29 MED ORDER — ONDANSETRON HCL 4 MG/2ML IJ SOLN
INTRAMUSCULAR | Status: DC | PRN
  Administered 2011-08-29: 4 mg via INTRAVENOUS

## 2011-08-29 MED ORDER — LIDOCAINE HCL 2 % IJ SOLN
INTRAMUSCULAR | Status: DC | PRN
  Administered 2011-08-29: 4 mL

## 2011-08-29 MED ORDER — MIDAZOLAM HCL 5 MG/5ML IJ SOLN
INTRAMUSCULAR | Status: DC | PRN
  Administered 2011-08-29 (×2): 1 mg via INTRAVENOUS

## 2011-08-29 MED ORDER — PROPOFOL 10 MG/ML IV EMUL
INTRAVENOUS | Status: DC | PRN
  Administered 2011-08-29: 20 mg via INTRAVENOUS
  Administered 2011-08-29: 10 mg via INTRAVENOUS
  Administered 2011-08-29: 30 mg via INTRAVENOUS

## 2011-08-29 MED ORDER — METOCLOPRAMIDE HCL 5 MG/ML IJ SOLN: 10.0000 mg | Freq: Once | INTRAMUSCULAR | Status: DC | PRN

## 2011-08-29 MED ORDER — LACTATED RINGERS IV SOLN
INTRAVENOUS | Status: DC
  Administered 2011-08-29: 08:00:00 via INTRAVENOUS

## 2011-08-29 MED ORDER — HYDROCODONE-ACETAMINOPHEN 5-325 MG PO TABS: ORAL_TABLET | ORAL | Status: AC

## 2011-08-29 MED ORDER — MORPHINE SULFATE 2 MG/ML IJ SOLN: 0.0500 mg/kg | INTRAMUSCULAR | Status: DC | PRN

## 2011-08-29 MED ORDER — FENTANYL CITRATE 0.05 MG/ML IJ SOLN
INTRAMUSCULAR | Status: DC | PRN
  Administered 2011-08-29 (×2): 50 ug via INTRAVENOUS

## 2011-08-29 MED ORDER — FENTANYL CITRATE 0.05 MG/ML IJ SOLN: 25.0000 ug | INTRAMUSCULAR | Status: DC | PRN

## 2011-08-29 SURGICAL SUPPLY — 36 items
BANDAGE ADHESIVE 1X3 (GAUZE/BANDAGES/DRESSINGS) IMPLANT
BANDAGE ELASTIC 3 VELCRO ST LF (GAUZE/BANDAGES/DRESSINGS) ×2 IMPLANT
BLADE SURG 15 STRL LF DISP TIS (BLADE) ×1 IMPLANT
BLADE SURG 15 STRL SS (BLADE) ×2
BNDG CMPR 9X4 STRL LF SNTH (GAUZE/BANDAGES/DRESSINGS) ×1
BNDG ESMARK 4X9 LF (GAUZE/BANDAGES/DRESSINGS) ×1 IMPLANT
BRUSH SCRUB EZ PLAIN DRY (MISCELLANEOUS) ×2 IMPLANT
CLOTH BEACON ORANGE TIMEOUT ST (SAFETY) ×2 IMPLANT
CORDS BIPOLAR (ELECTRODE) ×1 IMPLANT
COVER MAYO STAND STRL (DRAPES) ×2 IMPLANT
COVER TABLE BACK 60X90 (DRAPES) ×2 IMPLANT
CUFF TOURNIQUET SINGLE 18IN (TOURNIQUET CUFF) ×1 IMPLANT
DECANTER SPIKE VIAL GLASS SM (MISCELLANEOUS) ×1 IMPLANT
DRAPE EXTREMITY T 121X128X90 (DRAPE) ×2 IMPLANT
DRAPE SURG 17X23 STRL (DRAPES) ×2 IMPLANT
GLOVE BIOGEL M STRL SZ7.5 (GLOVE) ×2 IMPLANT
GLOVE ORTHO TXT STRL SZ7.5 (GLOVE) ×2 IMPLANT
GOWN PREVENTION PLUS XLARGE (GOWN DISPOSABLE) ×2 IMPLANT
GOWN PREVENTION PLUS XXLARGE (GOWN DISPOSABLE) ×4 IMPLANT
NEEDLE 27GAX1X1/2 (NEEDLE) ×1 IMPLANT
PACK BASIN DAY SURGERY FS (CUSTOM PROCEDURE TRAY) ×2 IMPLANT
PAD CAST 3X4 CTTN HI CHSV (CAST SUPPLIES) ×1 IMPLANT
PADDING CAST ABS 4INX4YD NS (CAST SUPPLIES) ×1
PADDING CAST ABS COTTON 4X4 ST (CAST SUPPLIES) ×1 IMPLANT
PADDING CAST COTTON 3X4 STRL (CAST SUPPLIES) ×2
SPLINT PLASTER CAST XFAST 3X15 (CAST SUPPLIES) ×5 IMPLANT
SPLINT PLASTER XTRA FASTSET 3X (CAST SUPPLIES) ×5
SPONGE GAUZE 4X4 12PLY (GAUZE/BANDAGES/DRESSINGS) ×2 IMPLANT
STOCKINETTE 4X48 STRL (DRAPES) ×2 IMPLANT
STRIP CLOSURE SKIN 1/2X4 (GAUZE/BANDAGES/DRESSINGS) ×2 IMPLANT
SUT PROLENE 3 0 PS 2 (SUTURE) ×2 IMPLANT
SYR 3ML 23GX1 SAFETY (SYRINGE) IMPLANT
SYR CONTROL 10ML LL (SYRINGE) ×1 IMPLANT
TRAY DSU PREP LF (CUSTOM PROCEDURE TRAY) ×2 IMPLANT
UNDERPAD 30X30 INCONTINENT (UNDERPADS AND DIAPERS) ×2 IMPLANT
WATER STERILE IRR 1000ML POUR (IV SOLUTION) ×1 IMPLANT

## 2011-08-29 NOTE — Interval H&P Note (Signed)
History and Physical Interval Note:  08/29/2011 8:37 AM  Kim Donovan  has presented today for surgery, with the diagnosis of right cts  The various methods of treatment have been discussed with the patient and family. After consideration of risks, benefits and other options for treatment, the patient has consented to  Procedure(s): CARPAL TUNNEL RELEASE as a surgical intervention .  The patients' history has been reviewed, patient examined, no change in status, stable for surgery.  I have reviewed the patients' chart and labs.  Questions were answered to the patient's satisfaction.     Richie Bonanno JR,Tehani Mersman V  H&P documentation: 08/29/2011  -History and Physical Reviewed  -Patient has been re-examined  -No change in the plan of care  Wyn Forster, MD

## 2011-08-29 NOTE — H&P (View-Only) (Signed)
Advised patient to stop Aspirin and fish oil until after surgery.

## 2011-08-29 NOTE — Transfer of Care (Signed)
Immediate Anesthesia Transfer of Care Note  Patient: Kim Donovan  Procedure(s) Performed:  CARPAL TUNNEL RELEASE  Patient Location: PACU  Anesthesia Type: MAC  Level of Consciousness: awake and alert   Airway & Oxygen Therapy: Patient Spontanous Breathing  Post-op Assessment: Report given to PACU RN and Post -op Vital signs reviewed and stable  Post vital signs: Reviewed and stable Filed Vitals:   08/29/11 0919  BP: 110/81  Pulse:   Temp:   Resp:     Complications: No apparent anesthesia complications

## 2011-08-29 NOTE — Brief Op Note (Signed)
08/29/2011  9:07 AM  PATIENT:  Kim Donovan  78 y.o. female  PRE-OPERATIVE DIAGNOSIS:  right carpal tunnel syndrome  POST-OPERATIVE DIAGNOSIS:  right carpal tunnel syndrome  PROCEDURE:  Procedure(s): CARPAL TUNNEL RELEASE RIGHT HAND  SURGEON:  Surgeon(s): Wyn Forster., MD  PHYSICIAN ASSISTANT:   ASSISTANTS: Mallory Shirk.A-C   ANESTHESIA:   none  EBL:     BLOOD ADMINISTERED:none  DRAINS: none   LOCAL MEDICATIONS USED:  LIDOCAINE 4 CC 2%  SPECIMEN:  No Specimen  DISPOSITION OF SPECIMEN:  N/A  COUNTS:  YES  TOURNIQUET:  * Missing tourniquet times found for documented tourniquets in log:  [id number] *  DICTATION: .Other Dictation: Dictation Number [id number]  PLAN OF CARE: Discharge to home after PACU  PATIENT DISPOSITION:  PACU - hemodynamically stable.   Delay start of Pharmacological VTE agent (>24hrs) due to surgical blood loss or risk of bleeding:NO/NOT APPLICABLE

## 2011-08-29 NOTE — H&P (Signed)
Kim Donovan is an 78 y.o. female.   Chief Complaint: Complaining of chronic and progressive right hand numbness and tingling HPI: Patient is an 78 year old female with a long-standing history of right hand numbness and tingling we have treated this best for approximately one year in our office. She had previously undergone left carpal tunnel release in May of 2012 and did very well following this procedure. She has a history of poly-myalgia rhematica which is treated with steroids. She presented to our office December 12 of 2012 with progressive carpal tunnel symptoms on the right. She has marked swelling of the hand. Repeat nerve conduction studies revealed right carpal tunnel syndrome. The patient wishes to proceed with surgical intervention.  Past Medical History  Diagnosis Date  . Hyperlipidemia   . Osteoarthritis   . Osteoporosis/osteopenia increased risk   . Glaucoma   . Aortic stenosis     myoview neg ischemia 11/2006  ---- echo 11/10: mean AVA gradient~47   ---- s/p bioprosthetic AVR 12/2009  . Polymyalgia rheumatica 11/05/2010  . PVD (peripheral vascular disease)     left leg swells  . Hypertension     pt. states does not have HTN, no meds    Past Surgical History  Procedure Date  . Appendectomy   . Tonsillectomy   . Cholecystectomy 1958  . Cardiac valve replacement May 2011    aortic valve  . Carpal tunnel release 12/2010    left hand  . Cataract/glaucoma surgery 11/2010    [facility]  . Breast surgery 1985    [facility]  . Abdominal hysterectomy     for fibroids  . Mastectomy     was only for removal of cysts, benign  . Eye surgery 01/2011    for glaucoma    Family History  Problem Relation Age of Onset  . Deep vein thrombosis Mother   . Heart attack Father   . Stroke Father   . Osteoporosis      aunt   Social History:  reports that she quit smoking about 33 years ago. Her smoking use included Cigarettes. She smoked .5 packs per day. She does not have any  smokeless tobacco history on file. She reports that she drinks about 1.8 ounces of alcohol per week. Her drug history not on file.  Allergies:  Allergies  Allergen Reactions  . Tramadol     Medications Prior to Admission  Medication Dose Route Frequency Provider Last Rate Last Dose  . chlorhexidine (HIBICLENS) 4 % liquid 4 application  60 mL Topical Once       . lactated ringers infusion   Intravenous Continuous Kerby Nora, MD 20 mL/hr at 08/29/11 8295     Medications Prior to Admission  Medication Sig Dispense Refill  . aspirin 81 MG tablet Take 81 mg by mouth daily.        . Calcium Citrate-Vitamin D 250-200 MG-UNIT TABS Take 1 tablet by mouth 3 (three) times daily.        . dorzolamide-timolol (COSOPT) 22.3-6.8 MG/ML ophthalmic solution 1 drop 2 (two) times daily.        Marland Kitchen glucosamine-chondroitin 500-400 MG tablet Take 1 tablet by mouth 3 (three) times daily.        . Multiple Vitamins-Minerals (CENTRUM SILVER ULTRA WOMENS PO) Take 1 tablet by mouth daily.        . Omega-3 Fatty Acids (FISH OIL) 1000 MG CAPS Take 1 capsule by mouth daily.        Marland Kitchen  PREDNISONE, PAK, PO Take 10 mg by mouth. Pt is currently 8mg  and is on taper dose pak.      . travoprost, benzalkonium, (TRAVATAN Z) 0.004 % ophthalmic solution Place 1 drop into both eyes at bedtime.       . zoledronic acid (RECLAST) 5 MG/100ML SOLN Inject 5 mg into the vein. Once a year      . amoxicillin (AMOXIL) 500 MG capsule 500 mg. 4 capsules by mouth 1 hour prior to dental appointment         No results found for this or any previous visit (from the past 48 hour(s)).  No results found.   Pertinent items are noted in HPI.  Blood pressure 119/75, pulse 72, temperature 98.2 F (36.8 C), temperature source Oral, resp. rate 18, height 5\' 6"  (1.676 m), weight 67.132 kg (148 lb), SpO2 100.00%.  General appearance: alert Head: Normocephalic, without obvious abnormality Neck: supple, symmetrical, trachea midline Resp: clear to  auscultation bilaterally Cardio: regular rate and rhythm, S1, S2 normal, no murmur, click, rub or gallop GI: normal findings: bowel sounds normal Extremities: Examination of the right hand reveals swelling of the dorsal extensor tenosynovium with swelling of the wrist. She has a positive Phalen's and Tinel's on the right. She has full range of motion of her fingers however. Repeat nerve conduction studies revealed delayed onset latency in both the motor and sensory portions consistent with a right carpal tunnel syndrome. Pulses: 2+ and symmetric Skin: normal Neurologic: Grossly normal    Assessment/Plan Impression: Right carpal tunnel syndrome  Plan: Patient to be taken to the operating room to undergo right carpal tunnel release. The procedure risks benefits and postoperative course were again discussed with the patient at length and she was in agreement with this plan.  DASNOIT,Kao Conry J 08/29/2011, 8:24 AM    H&P documentation: 08/29/2011  -History and Physical Reviewed  -Patient has been re-examined  -No change in the plan of care  Wyn Forster, MD

## 2011-08-29 NOTE — Interval H&P Note (Signed)
History and Physical Interval Note:  08/29/2011 8:37 AM  Kim Donovan  has presented today for surgery, with the diagnosis of right cts  The various methods of treatment have been discussed with the patient and family. After consideration of risks, benefits and other options for treatment, the patient has consented to  Procedure(s): CARPAL TUNNEL RELEASE as a surgical intervention .  The patients' history has been reviewed, patient examined, no change in status, stable for surgery.  I have reviewed the patients' chart and labs.  Questions were answered to the patient's satisfaction.     Frutoso Dimare JR,Hayden Mabin V

## 2011-08-29 NOTE — Anesthesia Postprocedure Evaluation (Signed)
Anesthesia Post Note  Patient: Kim Donovan  Procedure(s) Performed:  CARPAL TUNNEL RELEASE  Anesthesia type: General  Patient location: PACU  Post pain: Pain level controlled  Post assessment: Patient's Cardiovascular Status Stable  Last Vitals:  Filed Vitals:   08/29/11 0945  BP: 115/77  Pulse: 62  Temp: 36.6 C  Resp: 18    Post vital signs: Reviewed and stable  Level of consciousness: alert  Complications: No apparent anesthesia complications

## 2011-08-29 NOTE — Op Note (Signed)
OP NOTE DICTATED:08/29/11 [id number]

## 2011-08-29 NOTE — Anesthesia Preprocedure Evaluation (Addendum)
Anesthesia Evaluation  Patient identified by MRN, date of birth, ID band Patient awake    Reviewed: Allergy & Precautions, H&P , NPO status , Patient's Chart, lab work & pertinent test results, reviewed documented beta blocker date and time   Airway Mallampati: II TM Distance: >3 FB Neck ROM: full    Dental   Pulmonary neg pulmonary ROS,          Cardiovascular hypertension, On Medications + Valvular Problems/Murmurs AS     Neuro/Psych PSYCHIATRIC DISORDERS Depression Negative Neurological ROS     GI/Hepatic negative GI ROS, Neg liver ROS,   Endo/Other  Negative Endocrine ROS  Renal/GU negative Renal ROS  Genitourinary negative   Musculoskeletal   Abdominal   Peds  Hematology negative hematology ROS (+)   Anesthesia Other Findings See surgeon's H&P   Reproductive/Obstetrics negative OB ROS                           Anesthesia Physical Anesthesia Plan  ASA: III  Anesthesia Plan: MAC   Post-op Pain Management:    Induction: Intravenous  Airway Management Planned: Simple Face Mask  Additional Equipment:   Intra-op Plan:   Post-operative Plan: Extubation in OR  Informed Consent: I have reviewed the patients History and Physical, chart, labs and discussed the procedure including the risks, benefits and alternatives for the proposed anesthesia with the patient or authorized representative who has indicated his/her understanding and acceptance.     Plan Discussed with: CRNA and Surgeon  Anesthesia Plan Comments:        Anesthesia Quick Evaluation

## 2011-08-30 NOTE — Op Note (Signed)
NAME:  Kim Donovan                     ACCOUNT NO.:  MEDICAL RECORD NO.:  [id number]  LOCATION:                                 FACILITY:  PHYSICIAN:  Katy Fitch. Laverna Dossett, M.D. DATE OF BIRTH:  [birth year]  DATE OF PROCEDURE:  08/29/2011 DATE OF DISCHARGE:                              OPERATIVE REPORT   PREOPERATIVE DIAGNOSIS:  Significant right carpal tunnel syndrome associated with background polymyalgia rheumatica.  POSTOPERATIVE DIAGNOSIS:  Significant right carpal tunnel syndrome associated with background polymyalgia rheumatica.  OPERATION:  Release of right transverse carpal ligament.  OPERATING SURGEON:  Katy Fitch. Trusten Hume, M.D.  ASSISTANT:  Marveen Reeks. Dasnoit, PA-C.  ANESTHESIA:  Lidocaine 2% palmar block and median nerve block, supplemented by IV sedation.  SUPERVISING ANESTHESIOLOGIST:  Janetta Hora. Gelene Mink, M.D.  INDICATIONS:  Kim Donovan is an 78 year old woman referred through the courtesy of Dr. Alben Deeds, rheumatologist, for evaluation and management of hand discomfort and numbness.  She was noted to have bilateral thumb CMC arthrosis.  She was under Dr. Shawnee Knapp care for polymyalgia rheumatica, on a prednisone tapering dose.  Clinical examination revealed evidence of significant bilateral median neuropathy at wrist level.  Electrodiagnostic studies confirmed bilateral carpal tunnel syndrome.  She is status post prior treatment of her left carpal tunnel syndrome and now returns for release of the right transverse carpal ligament.  After informed consent, she was brought to the operating at this time.  PROCEDURE:  [name] was brought to room 1 at the [facility] and placed supine position on the operating table.  Following a detailed anesthesia informed consent by Dr. Gelene Mink, general anesthesia was deferred and local anesthesia with sedation, selected.  In room 1, under Dr. Thornton Dales supervision IV sedation was provided. The right arm  was then prepped with alcohol and Betadine followed by infiltration of 4 mL of 2% lidocaine into the path of intended incision and around the median nerve in the distal forearm.  After 10 minutes, excellent anesthesia was achieved.  The right hand and arm were prepped with Betadine soap and solution, sterilely draped.  A pneumatic tourniquet was applied to proximal right brachium.  Following exsanguination of the right arm with Esmarch bandage, arterial tourniquet was inflated to 220 mmHg.  Procedure commenced with a routine surgical time-out.  Thereafter, a 2-cm incision was fashioned in line of the ring finger in the palm with the distal margin of transcarpal ligament.  Subcutaneous tissues were carefully divided revealing the palmar fascia.  This was split longitudinally through the common sensory branch of the median nerve.  These were followed back to the median nerve proper, which was gently isolated from the transverse carpal ligament with a Insurance risk surveyor.  The transverse carpal ligament was then released along its ulnar border extending into the distal forearm. This widely opened carpal canal.  Bleeding points along the margin of the released ligament were electrocauterized with bipolar current followed by repair of the skin with intradermal 3-0 Prolene suture.  Compressive dressing was applied with a volar plaster splint maintaining the wrist in 5 degrees of dorsiflexion.  For aftercare, [name] is provided a prescription for  hydrocodone 5 mg 1 p.o. q.4-6 hours p.r.n. pain, 20 tablets, without refill.  We will see her back in followup in our office for dressing change, suture removal in 1 week or sooner if any problems.     Katy Fitch Romario Tith, M.D.     RVS/MEDQ  D:  08/29/2011  T:  08/30/2011  Job:  [id number]  cc:   Alben Deeds, MD Neta Mends. Fabian Sharp, MD

## 2011-09-01 ENCOUNTER — Encounter (HOSPITAL_BASED_OUTPATIENT_CLINIC_OR_DEPARTMENT_OTHER): Payer: Self-pay | Admitting: Orthopedic Surgery

## 2011-09-01 LAB — POCT HEMOGLOBIN-HEMACUE: Hemoglobin: 13.4 g/dL (ref 12.0–15.0)

## 2011-12-15 ENCOUNTER — Other Ambulatory Visit: Payer: Self-pay | Admitting: Internal Medicine

## 2011-12-15 DIAGNOSIS — Z1231 Encounter for screening mammogram for malignant neoplasm of breast: Secondary | ICD-10-CM

## 2011-12-25 ENCOUNTER — Encounter (HOSPITAL_COMMUNITY)
Admission: RE | Admit: 2011-12-25 | Discharge: 2011-12-25 | Disposition: A | Discharge status: Home / Self Care | Payer: MEDICARE | Source: Ambulatory Visit | Attending: Obstetrics and Gynecology | Admitting: Obstetrics and Gynecology

## 2011-12-25 ENCOUNTER — Encounter (HOSPITAL_COMMUNITY): Payer: Self-pay

## 2011-12-25 DIAGNOSIS — M810 Age-related osteoporosis without current pathological fracture: Secondary | ICD-10-CM | POA: Insufficient documentation

## 2011-12-25 MED ORDER — ZOLEDRONIC ACID 5 MG/100ML IV SOLN
5.0000 mg | Freq: Once | INTRAVENOUS | Status: AC
  Administered 2011-12-25: 5 mg via INTRAVENOUS
  Filled 2011-12-25: qty 100

## 2011-12-25 MED ORDER — SODIUM CHLORIDE 0.9 % IV SOLN
INTRAVENOUS | Status: AC
  Administered 2011-12-25: 250 mL via INTRAVENOUS

## 2011-12-25 NOTE — Discharge Instructions (Signed)
Drink fluids/water as tolerated over next 72hrs Tylenol or Ibuprofen OTC as directed Continue calcium and Vit D as directed by your MD 

## 2012-01-07 ENCOUNTER — Telehealth: Payer: Self-pay | Admitting: Internal Medicine

## 2012-01-07 NOTE — Telephone Encounter (Signed)
Spoke with pt, according to dr bensimhon last office note pt is to follow with up at the hosp.

## 2012-01-07 NOTE — Telephone Encounter (Signed)
Patient would like a return call at [phone]  Patient called in to make ROV appnt, would like to speak with nurse as she thought she would be going to [facility] w/ Dr. Gala Romney, please verify.  Please return call to patient to advise

## 2012-01-14 ENCOUNTER — Ambulatory Visit
Admission: RE | Admit: 2012-01-14 | Discharge: 2012-01-14 | Disposition: A | Discharge status: Home / Self Care | Payer: BC Managed Care – PPO | Source: Ambulatory Visit | Attending: Internal Medicine | Admitting: Internal Medicine

## 2012-01-14 DIAGNOSIS — Z1231 Encounter for screening mammogram for malignant neoplasm of breast: Secondary | ICD-10-CM

## 2012-04-12 ENCOUNTER — Encounter (HOSPITAL_COMMUNITY): Payer: Self-pay

## 2012-04-12 ENCOUNTER — Ambulatory Visit (HOSPITAL_COMMUNITY)
Admission: RE | Admit: 2012-04-12 | Discharge: 2012-04-12 | Disposition: A | Discharge status: Home / Self Care | Payer: MEDICARE | Source: Ambulatory Visit | Attending: Internal Medicine | Admitting: Internal Medicine

## 2012-04-12 VITALS — BP 130/76 | HR 71 | Resp 18 | Ht 67.0 in | Wt 150.0 lb

## 2012-04-12 DIAGNOSIS — I10 Essential (primary) hypertension: Secondary | ICD-10-CM | POA: Insufficient documentation

## 2012-04-12 DIAGNOSIS — M353 Polymyalgia rheumatica: Secondary | ICD-10-CM | POA: Insufficient documentation

## 2012-04-12 DIAGNOSIS — I359 Nonrheumatic aortic valve disorder, unspecified: Secondary | ICD-10-CM | POA: Insufficient documentation

## 2012-04-12 DIAGNOSIS — I350 Nonrheumatic aortic (valve) stenosis: Secondary | ICD-10-CM

## 2012-04-12 NOTE — Patient Instructions (Addendum)
Your physician has requested that you have an echocardiogram. Echocardiography is a painless test that uses sound waves to create images of your heart. It provides your doctor with information about the size and shape of your heart and how well your heart's chambers and valves are working. This procedure takes approximately one hour. There are no restrictions for this procedure.  We will contact you in 1 year to schedule your next appointment.  

## 2012-04-12 NOTE — Progress Notes (Signed)
HPI    Ms. Casler is a delightful, 78 year old woman who is the aunt of Dr. Dietrich Pates. She has a history of aortic stenosis and is s/p bioprostheitc AVR on 12/26/09.  Remainder of her past medical history is notable for borderline hypertension with questionable white coat syndrome, PMR followed by Dr. Dierdre Forth. Has been on prednisone since December 2011 (now 9 mg/day).   Echo 07/02/11: EF 55-60%. Grade 2 diastolic dysfunction. AVR looks fine.  She returns for routine follow up today.  She is doing well except for her polymyalgia rheumatica pain.  Her prednisone is down to 5 mg daily per Dr. Dierdre Forth.  She feels her muscles are weakening.  She would like to start rehab.  She continues to walk and denies SOB, edema, palpitations or CP.      Recent lipids (followed by Dr. Fabian Sharp)  Lab Results  Component Value Date   CHOL 264* 06/09/2011   HDL 117.90 06/09/2011   LDLCALC 77 06/07/2010   LDLDIRECT 125.4 06/09/2011   TRIG 76.0 06/09/2011   CHOLHDL 2 06/09/2011     Review of Systems: All other systems normal except as listed in the HPI and Problem List.   Past Medical History  Diagnosis Date  . Hyperlipidemia   . Osteoarthritis   . Osteoporosis/osteopenia increased risk   . Glaucoma   . Aortic stenosis     myoview neg ischemia 11/2006  ---- echo 11/10: mean AVA gradient~47   ---- s/p bioprosthetic AVR 12/2009  . Polymyalgia rheumatica 11/05/2010  . PVD (peripheral vascular disease)     left leg swells  . Hypertension     pt. states does not have HTN, no meds     Current Outpatient Prescriptions  Medication Sig Dispense Refill  . aspirin 81 MG tablet Take 81 mg by mouth daily.        . Calcium Citrate-Vitamin D 250-200 MG-UNIT TABS Take 1 tablet by mouth 3 (three) times daily.        . dorzolamide-timolol (COSOPT) 22.3-6.8 MG/ML ophthalmic solution 1 drop 2 (two) times daily.        Marland Kitchen glucosamine-chondroitin 500-400 MG tablet Take 1 tablet by mouth 3 (three) times daily.        .  Multiple Vitamins-Minerals (CENTRUM SILVER ULTRA WOMENS PO) Take 1 tablet by mouth daily.        . Omega-3 Fatty Acids (FISH OIL) 1000 MG CAPS Take 1 capsule by mouth daily.        Marland Kitchen PREDNISONE, PAK, PO Take 10 mg by mouth. Pt is currently 8mg  and is on taper dose pak.      . travoprost, benzalkonium, (TRAVATAN Z) 0.004 % ophthalmic solution Place 1 drop into both eyes at bedtime.       . zoledronic acid (RECLAST) 5 MG/100ML SOLN Inject 5 mg into the vein. Once a year      . amoxicillin (AMOXIL) 500 MG capsule 500 mg. 4 capsules by mouth 1 hour prior to dental appointment         Physical Exam Filed Vitals:   04/12/12 0905  BP: 130/76  Pulse: 71  Resp: 18  Height: 5\' 7"  (1.702 m)  Weight: 150 lb (68.04 kg)  SpO2: 98%   General:  Well appearing. no resp difficulty HEENT: normal Neck: supple. no JVD. Carotids 2+ bilat; no bruits. No lymphadenopathy or thryomegaly appreciated. Cor: PMI nondisplaced. Regular rate & rhythm. No rubs, gallops. Soft systolic ejection murmur over RSB Lungs: clear Abdomen: soft, nontender, nondistended.  No hepatosplenomegaly. No bruits or masses. Good bowel sounds. Extremities: no cyanosis, clubbing, rash, edema. no hot joints or rash. Neuro: alert & orientedx3, cranial nerves grossly intact. moves all 4 extremities w/o difficulty. affect pleasant   ECG: NSR 65 IVCD No ST-T wave abnormalities.      Assessment and Plan:

## 2012-04-12 NOTE — Assessment & Plan Note (Signed)
Followed by Dr. Dierdre Forth. Will order PT at [facility].

## 2012-04-12 NOTE — Assessment & Plan Note (Signed)
Doing well s/p bioprosthetic AVR. Will check f/u echo. Reminded her of SBE prophylaxis.

## 2012-04-12 NOTE — Assessment & Plan Note (Signed)
HDL very high. LDL coming up a bit - may be due to prednisone. Continue lowfat diet. Would not treat at this point.

## 2012-04-22 ENCOUNTER — Ambulatory Visit (INDEPENDENT_AMBULATORY_CARE_PROVIDER_SITE_OTHER): Payer: MEDICARE

## 2012-04-22 DIAGNOSIS — Z23 Encounter for immunization: Secondary | ICD-10-CM

## 2012-05-19 ENCOUNTER — Ambulatory Visit (HOSPITAL_COMMUNITY)
Admission: RE | Admit: 2012-05-19 | Discharge: 2012-05-19 | Disposition: A | Discharge status: Home / Self Care | Payer: MEDICARE | Source: Ambulatory Visit | Attending: Internal Medicine | Admitting: Internal Medicine

## 2012-05-19 DIAGNOSIS — I359 Nonrheumatic aortic valve disorder, unspecified: Secondary | ICD-10-CM | POA: Insufficient documentation

## 2012-05-19 DIAGNOSIS — I350 Nonrheumatic aortic (valve) stenosis: Secondary | ICD-10-CM

## 2012-05-19 DIAGNOSIS — I10 Essential (primary) hypertension: Secondary | ICD-10-CM | POA: Insufficient documentation

## 2012-05-19 DIAGNOSIS — I739 Peripheral vascular disease, unspecified: Secondary | ICD-10-CM | POA: Insufficient documentation

## 2012-05-19 DIAGNOSIS — I369 Nonrheumatic tricuspid valve disorder, unspecified: Secondary | ICD-10-CM | POA: Insufficient documentation

## 2012-05-19 NOTE — Progress Notes (Signed)
*  PRELIMINARY RESULTS* Echocardiogram 2D Echocardiogram has been performed.  Jeryl Columbia 05/19/2012, 11:01 AM

## 2012-06-11 ENCOUNTER — Ambulatory Visit (INDEPENDENT_AMBULATORY_CARE_PROVIDER_SITE_OTHER): Payer: MEDICARE | Admitting: Internal Medicine

## 2012-06-11 ENCOUNTER — Encounter: Payer: Self-pay | Admitting: Internal Medicine

## 2012-06-11 VITALS — BP 138/82 | HR 77 | Temp 97.5°F | Ht 66.5 in | Wt 151.0 lb

## 2012-06-11 DIAGNOSIS — E785 Hyperlipidemia, unspecified: Secondary | ICD-10-CM

## 2012-06-11 DIAGNOSIS — Z0000 Encounter for general adult medical examination without abnormal findings: Secondary | ICD-10-CM

## 2012-06-11 DIAGNOSIS — M810 Age-related osteoporosis without current pathological fracture: Secondary | ICD-10-CM

## 2012-06-11 DIAGNOSIS — I359 Nonrheumatic aortic valve disorder, unspecified: Secondary | ICD-10-CM

## 2012-06-11 DIAGNOSIS — Z8679 Personal history of other diseases of the circulatory system: Secondary | ICD-10-CM

## 2012-06-11 DIAGNOSIS — I350 Nonrheumatic aortic (valve) stenosis: Secondary | ICD-10-CM

## 2012-06-11 DIAGNOSIS — M353 Polymyalgia rheumatica: Secondary | ICD-10-CM

## 2012-06-11 DIAGNOSIS — I10 Essential (primary) hypertension: Secondary | ICD-10-CM

## 2012-06-11 DIAGNOSIS — M1990 Unspecified osteoarthritis, unspecified site: Secondary | ICD-10-CM

## 2012-06-11 DIAGNOSIS — M7071 Other bursitis of hip, right hip: Secondary | ICD-10-CM

## 2012-06-11 DIAGNOSIS — I872 Venous insufficiency (chronic) (peripheral): Secondary | ICD-10-CM

## 2012-06-11 DIAGNOSIS — M76899 Other specified enthesopathies of unspecified lower limb, excluding foot: Secondary | ICD-10-CM

## 2012-06-11 LAB — BASIC METABOLIC PANEL
CO2: 29 mEq/L (ref 19–32)
Chloride: 103 mEq/L (ref 96–112)
Creatinine, Ser: 0.7 mg/dL (ref 0.4–1.2)
Potassium: 3.9 mEq/L (ref 3.5–5.1)

## 2012-06-11 LAB — CBC WITH DIFFERENTIAL/PLATELET
Basophils Absolute: 0 10*3/uL (ref 0.0–0.1)
Eosinophils Relative: 1.9 % (ref 0.0–5.0)
HCT: 37.1 % (ref 36.0–46.0)
MCHC: 33.2 g/dL (ref 30.0–36.0)
MCV: 92.9 fl (ref 78.0–100.0)
Neutrophils Relative %: 60.7 % (ref 43.0–77.0)
RBC: 3.99 Mil/uL (ref 3.87–5.11)
WBC: 7.9 10*3/uL (ref 4.5–10.5)

## 2012-06-11 LAB — HEPATIC FUNCTION PANEL
AST: 29 U/L (ref 0–37)
Alkaline Phosphatase: 50 U/L (ref 39–117)
Total Bilirubin: 0.7 mg/dL (ref 0.3–1.2)

## 2012-06-11 LAB — LIPID PANEL
Total CHOL/HDL Ratio: 2
VLDL: 13.2 mg/dL (ref 0.0–40.0)

## 2012-06-11 LAB — T4, FREE: Free T4: 0.85 ng/dL (ref 0.60–1.60)

## 2012-06-11 NOTE — Progress Notes (Signed)
Subjective:    Patient ID: Kim Donovan, female    DOB: Feb 14, 1929, 78 y.o.   MRN: [id number]  HPI Patient comes in today for preventive visit and follow-up of medical issues. Update  history since  last visit: Still battling achiness and joint problems worse in am better in pm still walking . Dr Dierdre Forth Refer to PT because of pain and inactivity.  Some help. Arthritis progressing.  On 5 pred for PMR right hip pain affects gait CV no sx cp sob increasing edema LEGS redness area medaial left leg seen by dr Lowella Dandy and no sig reversable venous arterial disease by report  Poss small venous  Issue  Has some redness but no warmth or ulcers no increase in pain . Had CTS  GYne per Dr Ashok Norris and has been on reclast for osteoporosis  ? 3rd year  Nose.  Reports bp 110 range at home   Hearing:  Reports ok   Vision:  No limitations at present .glasses has glaucoma  rx per Dr Charlotte Sanes  Safety:  Has smoke detector and wears seat belts.  No firearms. No excess sun exposure. Sees dentist regularly.  Falls:  No falling   Advance directive :  Reviewed .  Memory: Felt to be good  , no concern from her or her family.  Depression: No anhedonia unusual crying or depressive symptoms  Nutrition: Eats well balanced diet; adequate calcium and vitamin D. No swallowing chewiing problems.  Injury: no major injuries in the last six months.  Other healthcare providers:  Reviewed today .  Social:  Lives with husband married. No pets.   Preventive parameters: up-to-date   ADLS:   There are no problems or need for assistance  driving, feeding, obtaining food, dressing, toileting and bathing, managing money using phone. She is independent.  Walks 5 x per week. tob no remote etoh less than 1 per day Outpatient Encounter Prescriptions as of 06/11/2012  Medication Sig Dispense Refill  . amoxicillin (AMOXIL) 500 MG capsule 500 mg. 4 capsules by mouth 1 hour prior to dental appointment       . aspirin 81 MG  tablet Take 81 mg by mouth daily.        . Calcium Citrate-Vitamin D 250-200 MG-UNIT TABS Take 1 tablet by mouth 3 (three) times daily.        . dorzolamide-timolol (COSOPT) 22.3-6.8 MG/ML ophthalmic solution 1 drop 2 (two) times daily.        . Multiple Vitamins-Minerals (CENTRUM SILVER ULTRA WOMENS PO) Take 1 tablet by mouth daily.        . Omega-3 Fatty Acids (FISH OIL) 1000 MG CAPS Take 1 capsule by mouth daily.        Marland Kitchen PREDNISONE, PAK, PO Take 5 mg by mouth daily.       . travoprost, benzalkonium, (TRAVATAN Z) 0.004 % ophthalmic solution Place 1 drop into both eyes at bedtime.       . zoledronic acid (RECLAST) 5 MG/100ML SOLN Inject 5 mg into the vein. Once a year      . glucosamine-chondroitin 500-400 MG tablet Take 1 tablet by mouth 3 (three) times daily.           Review of Systems ROS:  GEN/ HEENT: No fever, significant weight changes sweats headaches vision problems hearing changes, CV/ PULM; No chest pain shortness of breath cough, syncope,edema  change in exercise tolerance. GI /GU: No adominal pain, vomiting, change in bowel habits. No blood in the stool. SKIN/HEME: ,  no acute skin rashes suspicious lesions or bleeding.  Se above No lymphadenopathy, nodules, masses.  NEURO/ PSYCH:  No neurologic signs such as wemoakness numbness. No depression anxiety. IMM/ Allergy: No unusual infections.  Allergy .   REST of 12 system review negative except as per HPI Past history family history social history reviewed in the electronic medical record.     Objective:   Physical Exam BP 140/76  Pulse 77  Temp 97.5 F (36.4 C) (Oral)  Ht 5' 6.5" (1.689 m)  Wt 151 lb (68.493 kg)  BMI 24.01 kg/m2  SpO2 98% Repeat bp 138/82  Right   Physical Exam: Vital signs reviewed ZOX:WRUE is a well-developed well-nourished alert cooperative  white female who appears her stated age in no acute distress.  HEENT: normocephalic atraumatic , Eyes: PERRL EOM's full, conjunctiva clear, Nares: paten,t no  deformity discharge or tenderness., Ears: no deformity EAC's clear TMs with normal landmarks.little bit of wax  Mouth: clear OP, no lesions, edema.  Moist mucous membranes. Dentition in adequate repair. NECK: supple without masses, thyromegaly or bruits. CHEST/PULM:  Clear to auscultation and percussion breath sounds equal no wheeze , rales or rhonchi. No chest wall deformities or tenderness. CV: PMI is nondisplaced, S1 S2 no gallops,  Rubs. 2/6 systolic m USB some radiation  Peripheral pulses are present  .No JVD .  Breast: normal by inspection . No dimpling, discharge, masses, tenderness or discharge . ABDOMEN: Bowel sounds normal nontender  No guard or rebound, no hepato splenomegal no CVA tenderness.  No hernia. Extremtities:  No clubbing cyanosis or edema,   Joint changes  No redness oa left le wom mild edema and medial redness but no warmth ulcer or lesion  NEURO:  Oriented x3, cranial nerves 3-12 appear to be intact, no obvious focal weakness,gait slightly antalgic cause of right lateral hip pain. No weakness noted SKIN: No acute rashes normal turgor, color, no bruising or petechiae. Except leg.  PSYCH: Oriented, good eye contact, no obvious depression anxiety, cognition and judgment appear normal. LN: no cervical axillary inguinal adenopathy    Last set of labs below;  Lab Results  Component Value Date   WBC 9.2 06/09/2011   HGB 13.4 08/29/2011   HCT 38.9 06/09/2011   PLT 252.0 06/09/2011   GLUCOSE 93 06/09/2011   CHOL 264* 06/09/2011   TRIG 76.0 06/09/2011   HDL 117.90 06/09/2011   LDLDIRECT 125.4 06/09/2011   LDLCALC 77 06/07/2010   ALT 20 06/09/2011   AST 27 06/09/2011   NA 140 06/09/2011   K 3.6 06/09/2011   CL 100 06/09/2011   CREATININE 0.8 06/09/2011   BUN 21 06/09/2011   CO2 32 06/09/2011   TSH 0.99 06/09/2011   INR 1.65* 12/26/2009   HGBA1C  Value: 5.8 (NOTE)                                                                       According to the ADA Clinical  Practice Recommendations for 2011, when HbA1c is used as a screening test:   >=6.5%   Diagnostic of Diabetes Mellitus           (if abnormal result  is confirmed)  5.7-6.4%   Increased risk of developing Diabetes Mellitus  References:Diagnosis and Classification of Diabetes Mellitus,Diabetes Care,2011,34(Suppl 1):S62-S69 and Standards of Medical Care in         Diabetes - 2011,Diabetes [phone]  (Suppl 1):S11-S61.* 12/24/2009        Assessment & Plan:  Medicare prevention Preventive Health Care Counseled regarding healthy nutrition, exercise, sleep, injury prevention, calcium vit d and healthy weight . Continue exercise as tolerated  BP better after resting and at home DJD still problematic but trying to stay active PMR  On low dose pred 5   Osteoporsosis check vit d with labs today dexa being done via dr Judie Petit EYE vision stable under care  LIPIDS have been high but not felt to need med at this time AVR  No change per pt fu dr B as planned

## 2012-06-11 NOTE — Patient Instructions (Signed)
Continue lifestyle intervention healthy eating and exercise . Exerc as possible . Will notify you  of labs when available. Copy to you . If well then can do wellness visit in a year.  If leg gets infected looking then contact us for recheck.

## 2012-06-12 ENCOUNTER — Encounter: Payer: Self-pay | Admitting: Internal Medicine

## 2012-06-12 LAB — VITAMIN D 25 HYDROXY (VIT D DEFICIENCY, FRACTURES): Vit D, 25-Hydroxy: 49 ng/mL (ref 30–89)

## 2012-12-17 ENCOUNTER — Other Ambulatory Visit: Payer: Self-pay

## 2012-12-17 DIAGNOSIS — Z1231 Encounter for screening mammogram for malignant neoplasm of breast: Secondary | ICD-10-CM

## 2013-01-03 ENCOUNTER — Encounter (HOSPITAL_COMMUNITY): Payer: Self-pay

## 2013-01-03 ENCOUNTER — Ambulatory Visit (HOSPITAL_COMMUNITY)
Admission: RE | Admit: 2013-01-03 | Discharge: 2013-01-03 | Disposition: A | Discharge status: Home / Self Care | Payer: MEDICARE | Source: Ambulatory Visit | Attending: Obstetrics and Gynecology | Admitting: Obstetrics and Gynecology

## 2013-01-03 ENCOUNTER — Other Ambulatory Visit (HOSPITAL_COMMUNITY): Payer: Self-pay | Admitting: Obstetrics and Gynecology

## 2013-01-03 DIAGNOSIS — M810 Age-related osteoporosis without current pathological fracture: Secondary | ICD-10-CM | POA: Insufficient documentation

## 2013-01-03 MED ORDER — SODIUM CHLORIDE 0.9 % IV SOLN
Freq: Once | INTRAVENOUS | Status: AC
  Administered 2013-01-03: 09:00:00 via INTRAVENOUS

## 2013-01-03 MED ORDER — ZOLEDRONIC ACID 5 MG/100ML IV SOLN
5.0000 mg | Freq: Once | INTRAVENOUS | Status: AC
  Administered 2013-01-03: 5 mg via INTRAVENOUS
  Filled 2013-01-03: qty 100

## 2013-01-03 NOTE — Progress Notes (Signed)
Out ambulatory with spouse to home

## 2013-01-19 ENCOUNTER — Ambulatory Visit
Admission: RE | Admit: 2013-01-19 | Discharge: 2013-01-19 | Disposition: A | Discharge status: Home / Self Care | Payer: BC Managed Care – PPO | Source: Ambulatory Visit

## 2013-01-19 DIAGNOSIS — Z1231 Encounter for screening mammogram for malignant neoplasm of breast: Secondary | ICD-10-CM

## 2013-04-15 ENCOUNTER — Telehealth (HOSPITAL_COMMUNITY): Payer: Self-pay | Admitting: Cardiology

## 2013-04-15 DIAGNOSIS — I359 Nonrheumatic aortic valve disorder, unspecified: Secondary | ICD-10-CM

## 2013-04-15 NOTE — Telephone Encounter (Signed)
Order placed for upcoming echo 

## 2013-04-21 ENCOUNTER — Ambulatory Visit (HOSPITAL_BASED_OUTPATIENT_CLINIC_OR_DEPARTMENT_OTHER)
Admission: RE | Admit: 2013-04-21 | Discharge: 2013-04-21 | Disposition: A | Discharge status: Home / Self Care | Payer: MEDICARE | Source: Ambulatory Visit | Attending: Internal Medicine | Admitting: Internal Medicine

## 2013-04-21 ENCOUNTER — Ambulatory Visit (HOSPITAL_COMMUNITY)
Admission: RE | Admit: 2013-04-21 | Discharge: 2013-04-21 | Disposition: A | Discharge status: Home / Self Care | Payer: MEDICARE | Source: Ambulatory Visit | Attending: Internal Medicine | Admitting: Internal Medicine

## 2013-04-21 VITALS — BP 134/90 | HR 74 | Ht 66.5 in | Wt 150.8 lb

## 2013-04-21 DIAGNOSIS — I2789 Other specified pulmonary heart diseases: Secondary | ICD-10-CM | POA: Insufficient documentation

## 2013-04-21 DIAGNOSIS — I359 Nonrheumatic aortic valve disorder, unspecified: Secondary | ICD-10-CM | POA: Insufficient documentation

## 2013-04-21 DIAGNOSIS — I517 Cardiomegaly: Secondary | ICD-10-CM

## 2013-04-21 DIAGNOSIS — Y831 Surgical operation with implant of artificial internal device as the cause of abnormal reaction of the patient, or of later complication, without mention of misadventure at the time of the procedure: Secondary | ICD-10-CM | POA: Insufficient documentation

## 2013-04-21 DIAGNOSIS — I350 Nonrheumatic aortic (valve) stenosis: Secondary | ICD-10-CM

## 2013-04-21 DIAGNOSIS — T82897A Other specified complication of cardiac prosthetic devices, implants and grafts, initial encounter: Secondary | ICD-10-CM | POA: Insufficient documentation

## 2013-04-21 DIAGNOSIS — Z8249 Family history of ischemic heart disease and other diseases of the circulatory system: Secondary | ICD-10-CM | POA: Insufficient documentation

## 2013-04-21 NOTE — Addendum Note (Signed)
Encounter addended by: Dolores Patty, MD on: 04/21/2013  1:42 PM<BR>     Documentation filed: Notes Section

## 2013-04-21 NOTE — Progress Notes (Signed)
  Echocardiogram 2D Echocardiogram has been performed.  Cathie Beams 04/21/2013, 12:54 PM

## 2013-04-21 NOTE — Progress Notes (Addendum)
Patient ID: Kim Donovan, female   DOB: [birth year], 79 y.o.   MRN: [id number] HPI    Ms. Beaumont is a delightful, 79 year old woman who is the aunt of Dr. Dietrich Pates. She has a history of aortic stenosis and is s/p bioprostheitc AVR on 12/26/09.  Remainder of her past medical history is notable for borderline hypertension with questionable white coat syndrome, PMR followed by Dr. Dierdre Forth.   Echo 07/02/11: EF 55-60%. Grade 2 diastolic dysfunction. AVR looks fine. ECHO 04/21/13: EF 60% Mild to mod TR. RV ok. AVR looks good mean gradient  She returns for routine follow up today.  She is doing well. Exercising on treadmill routinely. Denies dyspnea. No edema or orthopnea. Remains on prednisone 5mg  daily for PMR and weekly methotrexate. BP well controlled at home. Takes abx for all procedures.   Lipids followed by Dr. Fabian Sharp   Last reading TC 244 HDL 102 LDL 121 TG 66   Review of Systems: All other systems normal except as listed in the HPI and Problem List.   Past Medical History  Diagnosis Date  . Hyperlipidemia   . Osteoarthritis   . Osteoporosis/osteopenia increased risk   . Glaucoma   . Aortic stenosis     myoview neg ischemia 11/2006  ---- echo 11/10: mean AVA gradient~47   ---- s/p bioprosthetic AVR 12/2009  . Polymyalgia rheumatica 11/05/2010  . PVD (peripheral vascular disease)     left leg swells  . Hypertension     pt. states does not have HTN, no meds     Current Outpatient Prescriptions  Medication Sig Dispense Refill  . aspirin 81 MG tablet Take 81 mg by mouth daily.        . bimatoprost (LUMIGAN) 0.03 % ophthalmic solution Place 1 drop into both eyes at bedtime.      . Calcium Citrate-Vitamin D 250-200 MG-UNIT TABS Take 1 tablet by mouth 3 (three) times daily.        . dorzolamide (TRUSOPT) 2 % ophthalmic solution Place 1 drop into the left eye 2 (two) times daily.      . dorzolamide-timolol (COSOPT) 22.3-6.8 MG/ML ophthalmic solution 1 drop 2 (two) times daily.        .  folic acid (FOLVITE) 1 MG tablet Take 1 mg by mouth daily.      Marland Kitchen glucosamine-chondroitin 500-400 MG tablet Take 1 tablet by mouth 3 (three) times daily.        . methotrexate (RHEUMATREX) 2.5 MG tablet Take 15 mg by mouth once a week. Caution:Chemotherapy. Protect from light.      . Multiple Vitamins-Minerals (CENTRUM SILVER ULTRA WOMENS PO) Take 1 tablet by mouth daily.        . Omega-3 Fatty Acids (FISH OIL) 1000 MG CAPS Take 1 capsule by mouth daily.        Marland Kitchen PREDNISONE, PAK, PO Take 5 mg by mouth daily.       . travoprost, benzalkonium, (TRAVATAN Z) 0.004 % ophthalmic solution Place 1 drop into both eyes at bedtime.       . zoledronic acid (RECLAST) 5 MG/100ML SOLN Inject 5 mg into the vein. Once a year      . amoxicillin (AMOXIL) 500 MG capsule 500 mg. 4 capsules by mouth 1 hour prior to dental appointment        No current facility-administered medications for this encounter.    Physical Exam Filed Vitals:   04/21/13 1325  BP: 134/90  Pulse: 74  Height: 5' 6.5" (  1.689 m)  Weight: 150 lb 12.8 oz (68.402 kg)  SpO2: 96%   General:  Well appearing. no resp difficulty HEENT: normal Neck: supple. no JVD. Carotids 2+ bilat; no bruits. No lymphadenopathy or thryomegaly appreciated. Cor: PMI nondisplaced. Regular rate & rhythm. No rubs, gallops. Soft systolic ejection murmur over RSB. s2 crisp Lungs: clear Abdomen: soft, nontender, nondistended. No hepatosplenomegaly. No bruits or masses. Good bowel sounds. Extremities: no cyanosis, clubbing, rash, edema. no hot joints or rash. Neuro: alert & orientedx3, cranial nerves grossly intact. moves all 4 extremities w/o difficulty. affect pleasant    Assessment and Plan:  1. AS s/p bioprosthetic AVR  2. PMR  Doing great. Valve stable on echo. Reinforced need for SBE prophylaxis. Will see back in 1 year with echo.   Daniel Bensimhon,MD 1:40 PM

## 2014-01-18 ENCOUNTER — Other Ambulatory Visit: Payer: Self-pay

## 2014-01-18 DIAGNOSIS — Z1231 Encounter for screening mammogram for malignant neoplasm of breast: Secondary | ICD-10-CM

## 2014-01-27 ENCOUNTER — Ambulatory Visit
Admission: RE | Admit: 2014-01-27 | Discharge: 2014-01-27 | Disposition: A | Discharge status: Home / Self Care | Payer: MEDICARE | Source: Ambulatory Visit

## 2014-01-27 DIAGNOSIS — Z1231 Encounter for screening mammogram for malignant neoplasm of breast: Secondary | ICD-10-CM

## 2014-05-01 ENCOUNTER — Other Ambulatory Visit (HOSPITAL_COMMUNITY): Payer: Self-pay | Admitting: Cardiology

## 2014-05-01 DIAGNOSIS — I359 Nonrheumatic aortic valve disorder, unspecified: Secondary | ICD-10-CM

## 2014-05-04 ENCOUNTER — Ambulatory Visit (HOSPITAL_COMMUNITY)
Admission: RE | Admit: 2014-05-04 | Discharge: 2014-05-04 | Disposition: A | Discharge status: Home / Self Care | Payer: MEDICARE | Source: Ambulatory Visit | Attending: Internal Medicine | Admitting: Internal Medicine

## 2014-05-04 ENCOUNTER — Encounter (HOSPITAL_COMMUNITY): Payer: Self-pay

## 2014-05-04 ENCOUNTER — Ambulatory Visit (HOSPITAL_BASED_OUTPATIENT_CLINIC_OR_DEPARTMENT_OTHER)
Admission: RE | Admit: 2014-05-04 | Discharge: 2014-05-04 | Disposition: A | Discharge status: Home / Self Care | Payer: MEDICARE | Source: Ambulatory Visit | Attending: Internal Medicine | Admitting: Internal Medicine

## 2014-05-04 VITALS — BP 126/68 | HR 75 | Wt 143.0 lb

## 2014-05-04 DIAGNOSIS — Z87891 Personal history of nicotine dependence: Secondary | ICD-10-CM | POA: Diagnosis not present

## 2014-05-04 DIAGNOSIS — Z954 Presence of other heart-valve replacement: Secondary | ICD-10-CM | POA: Insufficient documentation

## 2014-05-04 DIAGNOSIS — I10 Essential (primary) hypertension: Secondary | ICD-10-CM | POA: Diagnosis not present

## 2014-05-04 DIAGNOSIS — E785 Hyperlipidemia, unspecified: Secondary | ICD-10-CM | POA: Insufficient documentation

## 2014-05-04 DIAGNOSIS — I359 Nonrheumatic aortic valve disorder, unspecified: Secondary | ICD-10-CM | POA: Diagnosis not present

## 2014-05-04 DIAGNOSIS — I369 Nonrheumatic tricuspid valve disorder, unspecified: Secondary | ICD-10-CM

## 2014-05-04 DIAGNOSIS — Z952 Presence of prosthetic heart valve: Secondary | ICD-10-CM | POA: Insufficient documentation

## 2014-05-04 DIAGNOSIS — I350 Nonrheumatic aortic (valve) stenosis: Secondary | ICD-10-CM

## 2014-05-04 NOTE — Addendum Note (Signed)
Encounter addended by: Scarlette Calico, RN on: 05/04/2014 11:38 AM<BR>     Documentation filed: Patient Instructions Section, Visit Diagnoses, Orders

## 2014-05-04 NOTE — Progress Notes (Signed)
Patient ID: Kim Donovan, female   DOB: 1929-01-08, 80 y.o.   MRN: [id number] HPI    Kim Donovan is a delightful, 80 year old woman who is the aunt of Dr. [name]. She has a history of aortic stenosis and is s/p bioprostheitc AVR on 12/26/09.  Remainder of her past medical history is notable for borderline hypertension with questionable white coat syndrome, PMR followed by Dr. Amil Amen.   Echo 07/02/11: EF 55-60%. Grade 2 diastolic dysfunction. AVR looks fine. ECHO 04/21/13: EF 60% Mild to mod TR. RV ok. AVR looks good mean gradient ECHO 05/04/14: EF 60% Mild to mod TR. RV ok. AVR looks good mean gradient 11 Trivial AI  Yearly follow up: Doing well. Denies SOB or dizziness. Walks daily with no issues. Denies dyspnea or orthopnea. On prednisone 3mg  daily for PMR and weekly methotrexate. SBP at home 113-140s. Takes abx for all procedures.  Mild swelling of LLE - wears stocking   Lipids followed by Dr. Felipa Eth  Last reading TC 244 HDL 102 LDL 121 TG 66   Review of Systems: All other systems normal except as listed in the HPI and Problem List.   Past Medical History  Diagnosis Date  . Hyperlipidemia   . Osteoarthritis   . Osteoporosis/osteopenia increased risk   . Glaucoma   . Aortic stenosis     myoview neg ischemia 11/2006  ---- echo 11/10: mean AVA gradient~47   ---- s/p bioprosthetic AVR 12/2009  . Polymyalgia rheumatica 11/05/2010  . PVD (peripheral vascular disease)     left leg swells  . Hypertension     pt. states does not have HTN, no meds     Current Outpatient Prescriptions  Medication Sig Dispense Refill  . amoxicillin (AMOXIL) 500 MG capsule 500 mg. 4 capsules by mouth 1 hour prior to dental appointment       . aspirin 81 MG tablet Take 81 mg by mouth daily.        . Calcium Citrate-Vitamin D 250-200 MG-UNIT TABS Take 1 tablet by mouth 3 (three) times daily.        . dorzolamide-timolol (COSOPT) 22.3-6.8 MG/ML ophthalmic solution 1 drop 2 (two) times daily.        .  folic acid (FOLVITE) 1 MG tablet Take 1 mg by mouth daily.      Marland Kitchen glucosamine-chondroitin 500-400 MG tablet Take 1 tablet by mouth 3 (three) times daily.        . methotrexate (RHEUMATREX) 2.5 MG tablet Take 15 mg by mouth once a week. Caution:Chemotherapy. Protect from light.      . Multiple Vitamins-Minerals (CENTRUM SILVER ULTRA WOMENS PO) Take 1 tablet by mouth daily.        . Omega-3 Fatty Acids (FISH OIL) 1000 MG CAPS Take 1 capsule by mouth daily.        Marland Kitchen PREDNISONE, PAK, PO Take 5 mg by mouth daily.        No current facility-administered medications for this encounter.    Physical Exam Filed Vitals:   05/04/14 1115  BP: 126/68  Pulse: 75  Weight: 143 lb (64.864 kg)  SpO2: 98%   General:  Well appearing. no resp difficulty HEENT: normal Neck: supple. no JVD. Carotids 2+ bilat; no bruits. No lymphadenopathy or thryomegaly appreciated. Cor: PMI nondisplaced. Regular rate & rhythm. No rubs, gallops. Soft systolic ejection murmur over RSB. s2 crisp Lungs: clear Abdomen: soft, nontender, nondistended. No hepatosplenomegaly. No bruits or masses. Good bowel sounds. Extremities: no cyanosis, clubbing,  rash, 1+ edema on left no hot joints or rash. Neuro: alert & orientedx3, cranial nerves grossly intact. moves all 4 extremities w/o difficulty. affect pleasant    Assessment and Plan:  1. AS s/p bioprosthetic AVR  2. PMR - followed by Dr. Amil Amen  3. Hyperlipidemia - followed by PCP. Lipids not checked for some time. Will get fasting bloodwork.   Doing great. Valve stable on echo. Reinforced need for SBE prophylaxis. Will see back in 1 year with echo.   Junie Bame B,MD 11:20 AM  Patient seen and examined with Junie Bame, NP. We discussed all aspects of the encounter. I agree with the assessment and plan as stated above.   I reviewed echo personally. AVR looks great. Reinforced need for SBE prophylaxis. Will check lipids and liver panel.  Daniel Bensimhon,MD 11:32  AM

## 2014-05-04 NOTE — Progress Notes (Signed)
*  PRELIMINARY RESULTS* Echocardiogram 2D Echocardiogram has been performed.  Kim Donovan 05/04/2014, 10:49 AM

## 2014-05-04 NOTE — Patient Instructions (Signed)
Labs today  We will contact you in 1 year to schedule your next appointment and echocardiogram  

## 2014-05-04 NOTE — Addendum Note (Signed)
Encounter addended by: Scarlette Calico, RN on: 05/04/2014 11:55 AM<BR>     Documentation filed: Orders

## 2014-05-05 ENCOUNTER — Other Ambulatory Visit (INDEPENDENT_AMBULATORY_CARE_PROVIDER_SITE_OTHER): Payer: MEDICARE

## 2014-05-05 DIAGNOSIS — I10 Essential (primary) hypertension: Secondary | ICD-10-CM

## 2014-05-05 DIAGNOSIS — E785 Hyperlipidemia, unspecified: Secondary | ICD-10-CM

## 2014-05-05 LAB — HEPATIC FUNCTION PANEL
ALK PHOS: 57 U/L (ref 39–117)
ALT: 22 U/L (ref 0–35)
AST: 29 U/L (ref 0–37)
Albumin: 3.8 g/dL (ref 3.5–5.2)
BILIRUBIN TOTAL: 0.7 mg/dL (ref 0.2–1.2)
Bilirubin, Direct: 0.1 mg/dL (ref 0.0–0.3)
TOTAL PROTEIN: 7.2 g/dL (ref 6.0–8.3)

## 2014-05-05 LAB — BASIC METABOLIC PANEL
BUN: 24 mg/dL — ABNORMAL HIGH (ref 6–23)
CALCIUM: 9.4 mg/dL (ref 8.4–10.5)
CO2: 29 meq/L (ref 19–32)
Chloride: 100 mEq/L (ref 96–112)
Creatinine, Ser: 1.2 mg/dL (ref 0.4–1.2)
GFR: 46.48 mL/min — AB (ref >60.00)
Glucose, Bld: 93 mg/dL (ref 70–99)
Potassium: 4.1 mEq/L (ref 3.5–5.1)
SODIUM: 139 meq/L (ref 135–145)

## 2014-05-05 LAB — LIPID PANEL
CHOL/HDL RATIO: 3
Cholesterol: 258 mg/dL — ABNORMAL HIGH (ref 0–200)
HDL: 91 mg/dL (ref >39.00)
LDL Cholesterol: 153 mg/dL — ABNORMAL HIGH (ref 0–99)
NonHDL: 167
TRIGLYCERIDES: 70 mg/dL (ref 0.0–149.0)
VLDL: 14 mg/dL (ref 0.0–40.0)

## 2014-05-05 LAB — CBC
HCT: 36.3 % (ref 36.0–46.0)
Hemoglobin: 12.1 g/dL (ref 12.0–15.0)
MCHC: 33.4 g/dL (ref 30.0–36.0)
MCV: 96.7 fl (ref 78.0–100.0)
PLATELETS: 241 10*3/uL (ref 150.0–400.0)
RBC: 3.76 Mil/uL — ABNORMAL LOW (ref 3.87–5.11)
RDW: 15.2 % (ref 11.5–15.5)
WBC: 8.2 10*3/uL (ref 4.0–10.5)

## 2014-05-05 LAB — TSH: TSH: 1.07 u[IU]/mL (ref 0.35–4.50)

## 2014-05-12 ENCOUNTER — Telehealth (HOSPITAL_COMMUNITY): Payer: Self-pay | Admitting: *Deleted

## 2014-05-12 MED ORDER — EZETIMIBE 10 MG PO TABS: 10.0000 mg | ORAL_TABLET | Freq: Every day | ORAL | Status: DC

## 2014-05-12 NOTE — Telephone Encounter (Signed)
Message copied by Scarlette Calico on Fri May 12, 2014  2:22 PM ------      Message from: Jolaine Artist      Created: Fri May 12, 2014 11:34 AM       Discussed with Dr. [name]. Will not use statins due to PMR. Please start zetia 10 daily. ------

## 2014-05-12 NOTE — Telephone Encounter (Signed)
Pt aware, rx sent into [facility]

## 2014-05-22 ENCOUNTER — Other Ambulatory Visit (INDEPENDENT_AMBULATORY_CARE_PROVIDER_SITE_OTHER): Payer: MEDICARE

## 2014-05-22 ENCOUNTER — Other Ambulatory Visit: Payer: Self-pay | Admitting: *Deleted

## 2014-05-22 DIAGNOSIS — I10 Essential (primary) hypertension: Secondary | ICD-10-CM

## 2014-05-22 LAB — BASIC METABOLIC PANEL
BUN: 28 mg/dL — AB (ref 6–23)
CALCIUM: 9.4 mg/dL (ref 8.4–10.5)
CO2: 29 mEq/L (ref 19–32)
Chloride: 99 mEq/L (ref 96–112)
Creatinine, Ser: 1.1 mg/dL (ref 0.4–1.2)
GFR: 53.18 mL/min — AB (ref >60.00)
GLUCOSE: 82 mg/dL (ref 70–99)
Potassium: 4.6 mEq/L (ref 3.5–5.1)
Sodium: 134 mEq/L — ABNORMAL LOW (ref 135–145)

## 2015-02-07 ENCOUNTER — Other Ambulatory Visit: Payer: Self-pay | Admitting: Obstetrics and Gynecology

## 2015-02-07 DIAGNOSIS — N6489 Other specified disorders of breast: Secondary | ICD-10-CM

## 2015-02-07 DIAGNOSIS — R922 Inconclusive mammogram: Secondary | ICD-10-CM

## 2015-02-07 DIAGNOSIS — R928 Other abnormal and inconclusive findings on diagnostic imaging of breast: Secondary | ICD-10-CM

## 2015-02-09 ENCOUNTER — Ambulatory Visit
Admission: RE | Admit: 2015-02-09 | Discharge: 2015-02-09 | Disposition: A | Discharge status: Home / Self Care | Payer: Medicare Other | Source: Ambulatory Visit | Attending: Obstetrics and Gynecology | Admitting: Obstetrics and Gynecology

## 2015-02-09 DIAGNOSIS — R922 Inconclusive mammogram: Secondary | ICD-10-CM

## 2015-02-09 DIAGNOSIS — R928 Other abnormal and inconclusive findings on diagnostic imaging of breast: Secondary | ICD-10-CM

## 2015-02-09 DIAGNOSIS — N6489 Other specified disorders of breast: Secondary | ICD-10-CM

## 2015-04-13 ENCOUNTER — Other Ambulatory Visit (HOSPITAL_COMMUNITY): Payer: Self-pay | Admitting: *Deleted

## 2015-04-13 DIAGNOSIS — I509 Heart failure, unspecified: Secondary | ICD-10-CM

## 2015-05-15 ENCOUNTER — Ambulatory Visit (HOSPITAL_COMMUNITY)
Admission: RE | Admit: 2015-05-15 | Discharge: 2015-05-15 | Disposition: A | Discharge status: Home / Self Care | Payer: Medicare Other | Source: Ambulatory Visit | Attending: Internal Medicine | Admitting: Internal Medicine

## 2015-05-15 ENCOUNTER — Ambulatory Visit (HOSPITAL_BASED_OUTPATIENT_CLINIC_OR_DEPARTMENT_OTHER)
Admission: RE | Admit: 2015-05-15 | Discharge: 2015-05-15 | Disposition: A | Discharge status: Home / Self Care | Payer: Medicare Other | Source: Ambulatory Visit | Attending: Internal Medicine | Admitting: Internal Medicine

## 2015-05-15 VITALS — BP 152/86 | HR 78 | Wt 152.8 lb

## 2015-05-15 DIAGNOSIS — Z952 Presence of prosthetic heart valve: Secondary | ICD-10-CM

## 2015-05-15 DIAGNOSIS — I509 Heart failure, unspecified: Secondary | ICD-10-CM | POA: Diagnosis not present

## 2015-05-15 DIAGNOSIS — R350 Frequency of micturition: Secondary | ICD-10-CM | POA: Diagnosis not present

## 2015-05-15 DIAGNOSIS — R5383 Other fatigue: Secondary | ICD-10-CM | POA: Diagnosis not present

## 2015-05-15 DIAGNOSIS — E785 Hyperlipidemia, unspecified: Secondary | ICD-10-CM | POA: Diagnosis not present

## 2015-05-15 DIAGNOSIS — Z954 Presence of other heart-valve replacement: Secondary | ICD-10-CM

## 2015-05-15 DIAGNOSIS — R0600 Dyspnea, unspecified: Secondary | ICD-10-CM | POA: Diagnosis not present

## 2015-05-15 LAB — LIPID PANEL
CHOL/HDL RATIO: 2.1 ratio
Cholesterol: 242 mg/dL — ABNORMAL HIGH (ref 0–200)
HDL: 115 mg/dL (ref >40)
LDL CALC: 118 mg/dL — AB (ref 0–99)
Triglycerides: 47 mg/dL (ref <150)
VLDL: 9 mg/dL (ref 0–40)

## 2015-05-15 LAB — URINALYSIS, ROUTINE W REFLEX MICROSCOPIC
Bilirubin Urine: NEGATIVE
Glucose, UA: NEGATIVE mg/dL
KETONES UR: NEGATIVE mg/dL
Leukocytes, UA: NEGATIVE
NITRITE: NEGATIVE
Protein, ur: NEGATIVE mg/dL
Specific Gravity, Urine: 1.007 (ref 1.005–1.030)
Urobilinogen, UA: 0.2 mg/dL (ref 0.0–1.0)
pH: 7.5 (ref 5.0–8.0)

## 2015-05-15 LAB — CBC
HCT: 41.1 % (ref 36.0–46.0)
HEMOGLOBIN: 13.7 g/dL (ref 12.0–15.0)
MCH: 30.9 pg (ref 26.0–34.0)
MCHC: 33.3 g/dL (ref 30.0–36.0)
MCV: 92.6 fL (ref 78.0–100.0)
PLATELETS: 237 10*3/uL (ref 150–400)
RBC: 4.44 MIL/uL (ref 3.87–5.11)
RDW: 14.5 % (ref 11.5–15.5)
WBC: 6.3 10*3/uL (ref 4.0–10.5)

## 2015-05-15 LAB — BASIC METABOLIC PANEL
ANION GAP: 8 (ref 5–15)
BUN: 19 mg/dL (ref 6–20)
CO2: 28 mmol/L (ref 22–32)
CREATININE: 1.08 mg/dL — AB (ref 0.44–1.00)
Calcium: 10.3 mg/dL (ref 8.9–10.3)
Chloride: 102 mmol/L (ref 101–111)
GFR, EST AFRICAN AMERICAN: 53 mL/min — AB (ref >60)
GFR, EST NON AFRICAN AMERICAN: 46 mL/min — AB (ref >60)
Glucose, Bld: 114 mg/dL — ABNORMAL HIGH (ref 65–99)
Potassium: 4.2 mmol/L (ref 3.5–5.1)
Sodium: 138 mmol/L (ref 135–145)

## 2015-05-15 LAB — URINE MICROSCOPIC-ADD ON

## 2015-05-15 LAB — BRAIN NATRIURETIC PEPTIDE: B NATRIURETIC PEPTIDE 5: 111.4 pg/mL — AB (ref 0.0–100.0)

## 2015-05-15 LAB — TSH: TSH: 0.853 u[IU]/mL (ref 0.350–4.500)

## 2015-05-15 MED ORDER — FUROSEMIDE 20 MG PO TABS: 20.0000 mg | ORAL_TABLET | Freq: Every day | ORAL | Status: AC

## 2015-05-15 MED ORDER — POTASSIUM CHLORIDE ER 10 MEQ PO TBCR
10.0000 meq | EXTENDED_RELEASE_TABLET | Freq: Every day | ORAL | Status: DC
Start: 2015-05-15 — End: 2015-09-06

## 2015-05-15 MED ORDER — FUROSEMIDE 20 MG PO TABS: 20.0000 mg | ORAL_TABLET | Freq: Every day | ORAL | Status: DC

## 2015-05-15 MED ORDER — POTASSIUM CHLORIDE ER 10 MEQ PO TBCR: 10.0000 meq | EXTENDED_RELEASE_TABLET | Freq: Every day | ORAL | Status: DC

## 2015-05-15 NOTE — Progress Notes (Signed)
Patient ID: KARISTA AISPURO, female   DOB: Sep 07, 1928, 81 y.o.   MRN: [id number]   Patient ID: ANNIS LAGOY, female   DOB: 26-Sep-1928, 81 y.o.   MRN: [id number] HPI    Ms. Kimmons is a delightful 81 year old woman who is the aunt of Dr. [name]. She has a history of aortic stenosis and is s/p bioprosthetic AVR on 12/26/09.  Remainder of her past medical history is notable for borderline hypertension with questionable white coat syndrome, PMR followed by rheumatology (Dr Siad).   Echo 07/02/11: EF 55-60%. Grade 2 diastolic dysfunction. AVR looks fine. ECHO 04/21/13: EF 60% Mild to mod TR. RV ok. AVR looks good mean gradient ECHO 05/04/14: EF 60% Mild to mod TR. RV ok. AVR looks good mean gradient 11 Trivial AI ECHO 9/16: EF 55-60%, bioprosthetic AVR with mean gradient 10 and no AI, normal RV size and systolic function, moderate diastolic dysfunction, PA systolic pressure 32 mmHg.   Yearly follow up: Symptomatically doing well.  No exertional dyspnea.  No orthopnea or PND, no chest pain, no lightheadedness or syncope, no palpitations.  Main complaint is increased lower leg edema over the last few weeks.  She has had a venous ablation on the left in the past, and has had mild edema in the left lower leg before.  However, now the edema is much more marked and involves both legs.  She is on prednisone chronically, but the dose was recently decreased.  No change in diet.  She tries to avoid sodium, but says that she tends to be very "salt sensitive."  BP is mildly elevated today but has been in the 350K systolic when she checks at home.  Weight is up 9 lbs.    Labs (9/15): LDL 153, HDL 91 Labs (10/15): K 4.6, creatinine 1.1  Review of Systems: All other systems normal except as listed in the HPI and Problem List.   Past Medical History  Diagnosis Date  . Hyperlipidemia   . Osteoarthritis   . Osteoporosis/osteopenia increased risk   . Glaucoma   . Aortic stenosis     myoview neg ischemia 11/2006  ----  echo 11/10: mean AVA gradient~47   ---- s/p bioprosthetic AVR 12/2009  . Polymyalgia rheumatica 11/05/2010  . PVD (peripheral vascular disease)     left leg swells  . Hypertension     pt. states does not have HTN, no meds     Current Outpatient Prescriptions  Medication Sig Dispense Refill  . amoxicillin (AMOXIL) 500 MG capsule 500 mg. 4 capsules by mouth 1 hour prior to dental appointment     . aspirin 81 MG tablet Take 81 mg by mouth daily.      . Calcium Citrate-Vitamin D 250-200 MG-UNIT TABS Take 2 tablets by mouth daily.     . dorzolamide-timolol (COSOPT) 22.3-6.8 MG/ML ophthalmic solution Place 1 drop into the left eye 2 (two) times daily.     Marland Kitchen etanercept (ENBREL SURECLICK) 50 MG/ML injection Inject 50 mg into the skin once a week. Use every Tuesday    . ezetimibe (ZETIA) 10 MG tablet Take 1 tablet (10 mg total) by mouth daily. 30 tablet 6  . Glucosamine-Chondroitin (OSTEO BI-FLEX REGULAR STRENGTH PO) Take 2 tablets by mouth daily.    . Multiple Vitamins-Minerals (CENTRUM SILVER ULTRA WOMENS PO) Take 1 tablet by mouth daily.      . Omega-3 Fatty Acids (FISH OIL) 1000 MG CAPS Take 1 capsule by mouth daily.      Marland Kitchen  PREDNISONE, PAK, PO Take 5 mg by mouth every other day.     . furosemide (LASIX) 20 MG tablet Take 1 tablet (20 mg total) by mouth daily. 30 tablet 6  . potassium chloride (K-DUR) 10 MEQ tablet Take 1 tablet (10 mEq total) by mouth daily. 30 tablet 6   No current facility-administered medications for this encounter.    Physical Exam Filed Vitals:   05/15/15 0947  BP: 152/86  Pulse: 78  Weight: 152 lb 12.8 oz (69.31 kg)  SpO2: 99%   General:  Well appearing. no resp difficulty HEENT: normal Neck: supple. No JVD. Carotids 2+ bilat; no bruits. No lymphadenopathy or thryomegaly appreciated. Cor: PMI nondisplaced. Regular rate & rhythm. No rubs, gallops. Soft systolic ejection murmur over RSB. S2 crisp Lungs: clear Abdomen: soft, nontender, nondistended. No  hepatosplenomegaly. No bruits or masses. Good bowel sounds. Extremities: no cyanosis, clubbing, rash.  1+ edema to thighs bilaterally.  Neuro: alert & orientedx3, cranial nerves grossly intact. moves all 4 extremities w/o difficulty. affect pleasant   Assessment and Plan:  1. AS s/p bioprosthetic AVR: I reviewed today's echo.  The valve is well-seated with no evidence for stenosis or regurgitation.  She takes antibiotic prophylaxis with dental work. 2. PMR: Joint pain has been controlled recently, on prednisone and Enbrel. 3. Hyperlipidemia: She is on Zetia.  Check lipids today.  4. Chronic diastolic CHF: Marked peripheral edema, new over the last few weeks.  Echo today showed grade II diastolic dysfunction.  She is on prednisone but the dose was actually recently decreased. Enbrel does not appear to have edema as a side effect.   - Add Lasix 20 mg daily with KCl 10 daily. BMET in 2 wks.  - Wear compression stockings during the day.   - Check BNP  today.  - Check UA for proteinuria.   Followup in 1 month.  Loralie Champagne 05/15/2015

## 2015-05-15 NOTE — Patient Instructions (Signed)
Start Furosemide (Lasix) 20 mg daily  Start Potassium 10 meq daily  Labs today  Labs in 2 weeks  Please wear compression stockings during the day, put them on as soon as you get up and remove at bedtime  Your physician recommends that you schedule a follow-up appointment in: 1 month

## 2015-05-15 NOTE — Progress Notes (Signed)
Advanced Heart Failure Medication Review by a Pharmacist  Does the patient  feel that his/her medications are working for him/her?  yes  Has the patient been experiencing any side effects to the medications prescribed?  no  Does the patient measure his/her own blood pressure or blood glucose at home?  yes   Does the patient have any problems obtaining medications due to transportation or finances?   no  Understanding of regimen: good Understanding of indications: good Potential of compliance: good    Pharmacist comments:  Kim Donovan is a pleasant 81 yo F presenting with a current medication list. She has a great understanding of her regimen and the importance of consistent use. She did not have any specific medication-related questions or concerns for me at this time.   Kim Donovan. Kim Donovan, PharmD, BCPS, CPP Clinical Pharmacist Pager: [phone] Phone: [phone] 05/15/2015 10:03 AM

## 2015-05-29 ENCOUNTER — Ambulatory Visit (HOSPITAL_COMMUNITY)
Admission: RE | Admit: 2015-05-29 | Discharge: 2015-05-29 | Disposition: A | Discharge status: Home / Self Care | Payer: Medicare Other | Source: Ambulatory Visit | Attending: Cardiology | Admitting: Cardiology

## 2015-05-29 DIAGNOSIS — I5022 Chronic systolic (congestive) heart failure: Secondary | ICD-10-CM

## 2015-05-29 DIAGNOSIS — I350 Nonrheumatic aortic (valve) stenosis: Secondary | ICD-10-CM | POA: Diagnosis present

## 2015-05-29 LAB — BASIC METABOLIC PANEL
Anion gap: 9 (ref 5–15)
BUN: 28 mg/dL — AB (ref 6–20)
CALCIUM: 9.7 mg/dL (ref 8.9–10.3)
CHLORIDE: 98 mmol/L — AB (ref 101–111)
CO2: 30 mmol/L (ref 22–32)
Creatinine, Ser: 1.22 mg/dL — ABNORMAL HIGH (ref 0.44–1.00)
GFR calc Af Amer: 46 mL/min — ABNORMAL LOW (ref >60)
GFR calc non Af Amer: 40 mL/min — ABNORMAL LOW (ref >60)
GLUCOSE: 103 mg/dL — AB (ref 65–99)
POTASSIUM: 4.5 mmol/L (ref 3.5–5.1)
Sodium: 137 mmol/L (ref 135–145)

## 2015-06-04 ENCOUNTER — Telehealth: Payer: Self-pay | Admitting: Cardiology

## 2015-06-15 ENCOUNTER — Ambulatory Visit (HOSPITAL_COMMUNITY)
Admission: RE | Admit: 2015-06-15 | Discharge: 2015-06-15 | Disposition: A | Discharge status: Home / Self Care | Payer: Medicare Other | Source: Ambulatory Visit | Attending: Internal Medicine | Admitting: Internal Medicine

## 2015-06-15 VITALS — BP 108/70 | HR 82 | Wt 147.5 lb

## 2015-06-15 DIAGNOSIS — Z953 Presence of xenogenic heart valve: Secondary | ICD-10-CM | POA: Insufficient documentation

## 2015-06-15 DIAGNOSIS — Z952 Presence of prosthetic heart valve: Secondary | ICD-10-CM

## 2015-06-15 DIAGNOSIS — I739 Peripheral vascular disease, unspecified: Secondary | ICD-10-CM | POA: Diagnosis not present

## 2015-06-15 DIAGNOSIS — Z7982 Long term (current) use of aspirin: Secondary | ICD-10-CM | POA: Diagnosis not present

## 2015-06-15 DIAGNOSIS — Z954 Presence of other heart-valve replacement: Secondary | ICD-10-CM | POA: Diagnosis not present

## 2015-06-15 DIAGNOSIS — I5032 Chronic diastolic (congestive) heart failure: Secondary | ICD-10-CM | POA: Diagnosis not present

## 2015-06-15 DIAGNOSIS — M353 Polymyalgia rheumatica: Secondary | ICD-10-CM | POA: Diagnosis not present

## 2015-06-15 DIAGNOSIS — H409 Unspecified glaucoma: Secondary | ICD-10-CM | POA: Diagnosis not present

## 2015-06-15 DIAGNOSIS — Z79899 Other long term (current) drug therapy: Secondary | ICD-10-CM | POA: Diagnosis not present

## 2015-06-15 DIAGNOSIS — E785 Hyperlipidemia, unspecified: Secondary | ICD-10-CM | POA: Diagnosis not present

## 2015-06-15 LAB — BRAIN NATRIURETIC PEPTIDE: B Natriuretic Peptide: 100.1 pg/mL — ABNORMAL HIGH (ref 0.0–100.0)

## 2015-06-15 LAB — BASIC METABOLIC PANEL
Anion gap: 9 (ref 5–15)
BUN: 32 mg/dL — AB (ref 6–20)
CO2: 28 mmol/L (ref 22–32)
CREATININE: 1.23 mg/dL — AB (ref 0.44–1.00)
Calcium: 10.1 mg/dL (ref 8.9–10.3)
Chloride: 97 mmol/L — ABNORMAL LOW (ref 101–111)
GFR, EST AFRICAN AMERICAN: 45 mL/min — AB (ref >60)
GFR, EST NON AFRICAN AMERICAN: 39 mL/min — AB (ref >60)
GLUCOSE: 107 mg/dL — AB (ref 65–99)
Potassium: 4.6 mmol/L (ref 3.5–5.1)
Sodium: 134 mmol/L — ABNORMAL LOW (ref 135–145)

## 2015-06-15 NOTE — Progress Notes (Signed)
Patient ID: Kim Donovan, female   DOB: 03-06-1929, 81 y.o.   MRN: [id number]   Patient ID: Kim Donovan, female   DOB: 1928/12/25, 81 y.o.   MRN: [id number] PCP: Dr. Felipa Eth  Ms. Kuck is a delightful 81 year old woman who is the aunt of Dr. [name]. She has a history of aortic stenosis and is s/p bioprosthetic AVR on 12/26/09.  Remainder of her past medical history is notable for borderline hypertension with questionable white coat syndrome, PMR followed by rheumatology (Dr Siad).   Echo 07/02/11: EF 55-60%. Grade 2 diastolic dysfunction. AVR looks fine. ECHO 04/21/13: EF 60% Mild to mod TR. RV ok. AVR looks good mean gradient ECHO 05/04/14: EF 60% Mild to mod TR. RV ok. AVR looks good mean gradient 11 Trivial AI ECHO 9/16: EF 55-60%, bioprosthetic AVR with mean gradient 10 and no AI, normal RV size and systolic function, moderate diastolic dysfunction, PA systolic pressure 32 mmHg.   At last appointment, she was noted to have gained 9 lbs and had a lot of peripheral edema.  Mild JVD.  She was not particularly symptomatic. She was started on Lasix 20 mg daily.  She has also been wearing compression stockings.  Weight is down 5 lbs.  Swelling is better.  She is not short of breath walking on flat ground and can walk up 1-2 flights of steps without problems.  No lightheadedness, no chest pain.   Labs (9/15): LDL 153, HDL 91 Labs (10/15): K 4.6, creatinine 1.1 Labs (9/16): LDL 118, BNP 111 Labs (10/16): K 4.5, BUN 28, creatinine 1.22  Review of Systems: All other systems normal except as listed in the HPI and Problem List.   Past Medical History  Diagnosis Date  . Hyperlipidemia   . Osteoarthritis   . Osteoporosis/osteopenia increased risk   . Glaucoma   . Aortic stenosis     myoview neg ischemia 11/2006  ---- echo 11/10: mean AVA gradient~47   ---- s/p bioprosthetic AVR 12/2009  . Polymyalgia rheumatica 11/05/2010  . PVD (peripheral vascular disease)     left leg swells  . Hypertension      pt. states does not have HTN, no meds     Current Outpatient Prescriptions  Medication Sig Dispense Refill  . aspirin 81 MG tablet Take 81 mg by mouth daily.      . Calcium Citrate-Vitamin D 250-200 MG-UNIT TABS Take 2 tablets by mouth daily.     . dorzolamide-timolol (COSOPT) 22.3-6.8 MG/ML ophthalmic solution Place 1 drop into the left eye 2 (two) times daily.     Marland Kitchen etanercept (ENBREL SURECLICK) 50 MG/ML injection Inject 50 mg into the skin once a week. Use every Tuesday    . ezetimibe (ZETIA) 10 MG tablet Take 1 tablet (10 mg total) by mouth daily. 30 tablet 6  . furosemide (LASIX) 20 MG tablet Take 1 tablet (20 mg total) by mouth daily. 30 tablet 6  . Glucosamine-Chondroitin (OSTEO BI-FLEX REGULAR STRENGTH PO) Take 2 tablets by mouth daily.    . Multiple Vitamins-Minerals (CENTRUM SILVER ULTRA WOMENS PO) Take 1 tablet by mouth daily.      . Omega-3 Fatty Acids (FISH OIL) 1000 MG CAPS Take 1 capsule by mouth daily.      . potassium chloride (K-DUR) 10 MEQ tablet Take 1 tablet (10 mEq total) by mouth daily. 30 tablet 6  . PREDNISONE, PAK, PO Take 5 mg by mouth every other day.     Marland Kitchen amoxicillin (AMOXIL) 500 MG  capsule 500 mg. 4 capsules by mouth 1 hour prior to dental appointment      No current facility-administered medications for this encounter.    Physical Exam Filed Vitals:   06/15/15 0851  BP: 108/70  Pulse: 82  Weight: 147 lb 8 oz (66.906 kg)  SpO2: 99%   General:  Well appearing. no resp difficulty HEENT: normal Neck: supple. No JVD. Carotids 2+ bilat; no bruits. No lymphadenopathy or thryomegaly appreciated. Cor: PMI nondisplaced. Regular rate & rhythm. No rubs, gallops. Soft systolic ejection murmur over RSB with clear S2 Lungs: clear Abdomen: soft, nontender, nondistended. No hepatosplenomegaly. No bruits or masses. Good bowel sounds. Extremities: no cyanosis, clubbing, rash.  1+ edema 1/2 to knees bilaterally (improved).  Neuro: alert & orientedx3, cranial  nerves grossly intact. moves all 4 extremities w/o difficulty. affect pleasant   Assessment and Plan:  1. AS s/p bioprosthetic AVR:  Echo in 9/16 showed that the bioprosthetic aortic valve is well-seated with no evidence for stenosis or regurgitation.  She takes antibiotic prophylaxis with dental work. 2. PMR: Joint pain has been controlled recently, on prednisone and Enbrel. 3. Hyperlipidemia: She is on Zetia.  Recent lipids acceptable.  4. Chronic diastolic CHF: EF normal by echo.  Recent addition of Lasix has led to loss of 5 lbs and improved peripheral edema. - Continue Lasix 20 mg daily with KCl 10 daily.  BMET today.  - Continue to wear compression stockings during the day.    Followup in 4 months.  Loralie Champagne 06/15/2015

## 2015-06-15 NOTE — Patient Instructions (Signed)
Labs today  We will contact you in 4 months to schedule your next appointment.  

## 2015-07-19 ENCOUNTER — Telehealth (HOSPITAL_COMMUNITY): Payer: Self-pay

## 2015-07-19 NOTE — Telephone Encounter (Signed)
Patient called and said she has been having stomach pain, indigestion and feeling tired for the last couple months.  Patient thinks that maybe it is from the Lasix and Clor-Con. She wanted to stop the Lasix and Clor-con for a few days to see if they would make her feel better.  Advised patient she needs to call her PCP Dr. Felipa Eth for an appointment so he can evaluate her for the pain, indigestion and feeling tired.

## 2015-07-19 NOTE — Telephone Encounter (Signed)
Try cutting to every other day rather than stopping.  Would see PCP for further workup.

## 2015-07-20 ENCOUNTER — Other Ambulatory Visit: Payer: Self-pay | Admitting: Geriatric Medicine

## 2015-07-20 DIAGNOSIS — R1011 Right upper quadrant pain: Secondary | ICD-10-CM

## 2015-07-20 NOTE — Telephone Encounter (Signed)
Patient is seeing PCP today about abdominal pain. Told her husband that if she would like she could try taking the potasium and lasix every other day

## 2015-07-30 ENCOUNTER — Other Ambulatory Visit: Payer: Self-pay | Admitting: Geriatric Medicine

## 2015-07-30 ENCOUNTER — Ambulatory Visit
Admission: RE | Admit: 2015-07-30 | Discharge: 2015-07-30 | Disposition: A | Discharge status: Home / Self Care | Payer: Medicare Other | Source: Ambulatory Visit | Attending: Geriatric Medicine | Admitting: Geriatric Medicine

## 2015-07-30 DIAGNOSIS — R1011 Right upper quadrant pain: Secondary | ICD-10-CM

## 2015-07-31 ENCOUNTER — Other Ambulatory Visit: Payer: Self-pay | Admitting: Geriatric Medicine

## 2015-07-31 DIAGNOSIS — N2889 Other specified disorders of kidney and ureter: Secondary | ICD-10-CM

## 2015-08-03 ENCOUNTER — Ambulatory Visit (HOSPITAL_COMMUNITY)
Admission: RE | Admit: 2015-08-03 | Discharge: 2015-08-03 | Disposition: A | Discharge status: Home / Self Care | Payer: Medicare Other | Source: Ambulatory Visit | Attending: Geriatric Medicine | Admitting: Geriatric Medicine

## 2015-08-03 DIAGNOSIS — R1011 Right upper quadrant pain: Secondary | ICD-10-CM | POA: Diagnosis not present

## 2015-08-03 DIAGNOSIS — N2889 Other specified disorders of kidney and ureter: Secondary | ICD-10-CM | POA: Diagnosis not present

## 2015-08-03 DIAGNOSIS — J90 Pleural effusion, not elsewhere classified: Secondary | ICD-10-CM | POA: Insufficient documentation

## 2015-08-03 DIAGNOSIS — K769 Liver disease, unspecified: Secondary | ICD-10-CM | POA: Insufficient documentation

## 2015-08-03 DIAGNOSIS — R933 Abnormal findings on diagnostic imaging of other parts of digestive tract: Secondary | ICD-10-CM | POA: Insufficient documentation

## 2015-08-03 LAB — POCT I-STAT CREATININE: Creatinine, Ser: 1 mg/dL (ref 0.44–1.00)

## 2015-08-03 MED ORDER — GADOBENATE DIMEGLUMINE 529 MG/ML IV SOLN
15.0000 mL | Freq: Once | INTRAVENOUS | Status: AC | PRN
  Administered 2015-08-03: 13 mL via INTRAVENOUS

## 2015-08-06 ENCOUNTER — Encounter: Payer: Self-pay | Admitting: Oncology

## 2015-08-06 ENCOUNTER — Ambulatory Visit (HOSPITAL_BASED_OUTPATIENT_CLINIC_OR_DEPARTMENT_OTHER): Payer: Medicare Other | Admitting: Oncology

## 2015-08-06 ENCOUNTER — Ambulatory Visit (HOSPITAL_BASED_OUTPATIENT_CLINIC_OR_DEPARTMENT_OTHER): Payer: Medicare Other

## 2015-08-06 ENCOUNTER — Other Ambulatory Visit: Payer: Self-pay | Admitting: *Deleted

## 2015-08-06 ENCOUNTER — Telehealth: Payer: Self-pay | Admitting: Oncology

## 2015-08-06 VITALS — BP 146/89 | HR 91 | Temp 98.5°F | Resp 17 | Ht 66.5 in | Wt 141.6 lb

## 2015-08-06 DIAGNOSIS — J90 Pleural effusion, not elsewhere classified: Secondary | ICD-10-CM | POA: Diagnosis not present

## 2015-08-06 DIAGNOSIS — N2889 Other specified disorders of kidney and ureter: Secondary | ICD-10-CM

## 2015-08-06 DIAGNOSIS — R932 Abnormal findings on diagnostic imaging of liver and biliary tract: Secondary | ICD-10-CM

## 2015-08-06 DIAGNOSIS — B370 Candidal stomatitis: Secondary | ICD-10-CM | POA: Diagnosis not present

## 2015-08-06 LAB — COMPREHENSIVE METABOLIC PANEL WITH GFR
ALT: 30 U/L (ref 0–55)
AST: 34 U/L (ref 5–34)
Albumin: 3.6 g/dL (ref 3.5–5.0)
Alkaline Phosphatase: 105 U/L (ref 40–150)
Anion Gap: 9 meq/L (ref 3–11)
BUN: 24.6 mg/dL (ref 7.0–26.0)
CO2: 30 meq/L — ABNORMAL HIGH (ref 22–29)
Calcium: 10.2 mg/dL (ref 8.4–10.4)
Chloride: 93 meq/L — ABNORMAL LOW (ref 98–109)
Creatinine: 1.2 mg/dL — ABNORMAL HIGH (ref 0.6–1.1)
EGFR: 40 ml/min/1.73 m2 — ABNORMAL LOW (ref >90)
Glucose: 98 mg/dL (ref 70–140)
Potassium: 4.7 meq/L (ref 3.5–5.1)
Sodium: 132 meq/L — ABNORMAL LOW (ref 136–145)
Total Bilirubin: 0.43 mg/dL (ref 0.20–1.20)
Total Protein: 8.3 g/dL (ref 6.4–8.3)

## 2015-08-06 MED ORDER — ALPRAZOLAM 0.25 MG PO TABS: 0.2500 mg | ORAL_TABLET | Freq: Every evening | ORAL | Status: DC | PRN

## 2015-08-06 MED ORDER — FLUCONAZOLE 100 MG PO TABS: 100.0000 mg | ORAL_TABLET | Freq: Every day | ORAL | Status: DC

## 2015-08-06 NOTE — Telephone Encounter (Signed)
Patient sent back to lab and given avs report and appointments for December. [facility] will contact patient re ct and bx - patient aware.

## 2015-08-06 NOTE — Addendum Note (Signed)
Addended by: Domenic Schwab on: 08/06/2015 01:27 PM   Modules accepted: Orders

## 2015-08-06 NOTE — Telephone Encounter (Signed)
New patient-s/w patient and gave np appt for 12/19 @ 10 per md.

## 2015-08-06 NOTE — Progress Notes (Signed)
[facility] Patient Consult   Referring MD: Lajean Manes, Md [address], [place] [address]   Kim Donovan 81 y.o.  Jun 21, 1929    Reason for Referral: Renal mass, liver lesions   HPI: Kim Donovan reports a 2 month history of right sided abdominal/flank discomfort. She saw Dr. Felipa Eth and was referred for an ultrasound of the abdomen 07/30/2015. This confirmed a small hemangioma in the right liver and right hepatic "cysts ". An echogenic appearance was noted throughout the entire right kidney and a mass could not be excluded.  She was referred for an MRI of the abdomen on 08/03/2015. Multiple right-sided liver lesions were noted including a posterior right liver lesion measuring 1.3 cm with T2 hyperintensity and hypoenhancing. There is a small right pleural effusion. Markedly abnormal appearance of the right kidney with a dilated collecting system and renal pelvis. Areas of precontrast T1 hyperintensity are suspicious for hemorrhage. Markedly diminished postcontrast enhancement throughout the right kidney. The right renal process extends into the IVC with suspicious soft tissue fullness in the aortocaval space. The intrahepatic bile ducts are mildly prominent with the common duct measuring up to 1.5 cm in the porta hepatis with suspicion for a mass effect from the right retroperitoneal process.    Past Medical History  Diagnosis Date  . Hyperlipidemia   . Osteoarthritis   . Osteoporosis/osteopenia increased risk   . Glaucoma   . Aortic stenosis     myoview neg ischemia 11/2006  ---- echo 11/10: mean AVA gradient~47   ---- s/p bioprosthetic AVR 12/2009  . Polymyalgia rheumatica 11/05/2010  . PVD (peripheral vascular disease)     left leg swells  . Hypertension     pt. states does not have HTN, no meds   .   G0P0  Past Surgical History  Procedure Laterality Date  . Appendectomy    . Tonsillectomy    . Cholecystectomy  1958  . Cardiac  valve replacement  May 2011    aortic valve  . Carpal tunnel release  12/2010    left hand  . Cataract/glaucoma surgery  11/2010    [facility]  . Breast surgery  1985    bx-benign   . Abdominal hysterectomy      for fibroids  .         Marland Kitchen Eye surgery  01/2011    for glaucoma  . Carpal tunnel release  08/29/2011    Procedure: CARPAL TUNNEL RELEASE;  Surgeon: Cammie Sickle., MD;  Location: [facility];  Service: Orthopedics;  Laterality: Right;    Medications: Reviewed  Allergies:  Allergies  Allergen Reactions  . Tramadol Rash    After surgery pt  broke out into a rash.Pt stop taking.    Family history: Her sister died at age 11 of metastatic renal cell carcinoma. Another sister had squamous cell and basal cell skin cancers. No other family history of cancer. She has no children.  Social History:   She is a retired Pharmacist, hospital. She quit smoking cigarettes 30-40 years ago. Rare alcohol use. No transfusion history. No risk factor for HIV or hepatitis    ROS:   Positives include: Anorexia, 5 pound weight loss, one episode of nausea and vomiting a few weeks ago, insomnia, oral "thrush "  A complete ROS was otherwise negative.  Physical Exam:  Blood pressure 146/89, pulse 91, temperature 98.5 F (36.9 C), temperature source Oral, resp. rate 17, height  5' 6.5" (1.689 m), weight 141 lb 9.6 oz (64.229 kg), SpO2 100 %.  HEENT: There is thrush over the tongue and bilateral buccal mucosa, oropharynx without visible mass, neck without mass Lungs: Clear bilaterally Cardiac: Regular rate and rhythm Abdomen: No hepatosplenomegaly, no mass, no apparent ascites, nontender  Vascular: No leg edema Lymph nodes: No cervical, supra-clavicular, axillary, or inguinal nodes Neurologic: Alert and oriented, the motor exam appears intact in the upper and lower extremities Skin: Benign appearing moles over the trunk, no rash Musculoskeletal: No spine or flank  tenderness   LAB:  CBC on 07/20/2015-hemoglobin 12.7, platelet is 235,000, white count 7.8, ANC 4.2   CMP-08/06/2015: Potassium 4.7, BUN 24.6, creatinine 1.2, calcium 10.2, albumin 3.6    Imaging:  MRI abdomen 08/03/2015-reviewed with a radiologist, Kim Donovan did not want to review the images   Assessment/Plan:   1. Right renal mass  MRI 08/03/2015 with multiple suspicious liver lesions, replacement of the right kidney consistent with an infiltrative neoplasm, probable rectoperineal adenopathy, extension of tumor into the IVC, small right pleural effusion 2. Right abdomen/flank discomfort secondary to #1  3.   Family history of renal cell cancer  4.   Anorexia/weight loss secondary to #1  5.   Oral candidiasis-Diflucan prescribed today  6.   Status post aortic valve replacement  7.    Polymyalgia rheumatica   Disposition:   Kim Donovan presents with a right renal mass. An MRI of the abdomen is consistent with an infiltrative right renal tumor with extension into the IVC, retroperitoneal adenopathy, and liver metastases. I discussed the probable diagnosis and treatment options with Kim Donovan and her family. If she is proven to have metastatic renal cell carcinoma then no therapy Donovan be curative. We Donovan likely recommend treatment with systemic therapy to include a tyrosine kinase inhibitor.  The Enbrel can be associated with malignancies, but I think this is an unlikely association in her case. She Donovan hold the Enbrel for now.  Kim Donovan be referred to the genetics counselor with the family history of renal cell carcinoma.  She Donovan be scheduled for staging CTs of the chest, abdomen, and pelvis this week. We Donovan request a biopsy in interventional radiology to confirm a diagnosis.  Kim Donovan Donovan complete a course of Diflucan for the oral candidiasis. She Donovan return for an office visit here on 08/10/2015.  [place], [place] 08/06/2015, 11:44 AM

## 2015-08-07 ENCOUNTER — Telehealth: Payer: Self-pay | Admitting: Genetic Counselor

## 2015-08-07 ENCOUNTER — Telehealth: Payer: Self-pay | Admitting: *Deleted

## 2015-08-07 NOTE — Telephone Encounter (Signed)
Per Dr. Benay Spice; notified pt that she should push fluids prior to CT with contrast 12/21; creatinine slightly high and MD wants to make sure kidneys are functioning well.  Pt verbalized understanding and states she "will drink plenty of fluids"

## 2015-08-07 NOTE — Telephone Encounter (Signed)
-----   Message from Kim Pier, MD sent at 08/06/2015  7:51 PM EST ----- Please call patient, she should  Push fluids prior to CT

## 2015-08-07 NOTE — Telephone Encounter (Signed)
CALLED PT PHONE JUST RANG

## 2015-08-08 ENCOUNTER — Ambulatory Visit (HOSPITAL_COMMUNITY)
Admission: RE | Admit: 2015-08-08 | Discharge: 2015-08-08 | Disposition: A | Discharge status: Home / Self Care | Payer: Medicare Other | Source: Ambulatory Visit | Attending: Oncology | Admitting: Oncology

## 2015-08-08 ENCOUNTER — Encounter (HOSPITAL_COMMUNITY): Payer: Self-pay

## 2015-08-08 ENCOUNTER — Ambulatory Visit (HOSPITAL_COMMUNITY): Payer: Medicare Other

## 2015-08-08 ENCOUNTER — Other Ambulatory Visit: Payer: Self-pay | Admitting: Oncology

## 2015-08-08 DIAGNOSIS — C801 Malignant (primary) neoplasm, unspecified: Secondary | ICD-10-CM | POA: Insufficient documentation

## 2015-08-08 DIAGNOSIS — I2510 Atherosclerotic heart disease of native coronary artery without angina pectoris: Secondary | ICD-10-CM | POA: Insufficient documentation

## 2015-08-08 DIAGNOSIS — N281 Cyst of kidney, acquired: Secondary | ICD-10-CM | POA: Diagnosis not present

## 2015-08-08 DIAGNOSIS — N2889 Other specified disorders of kidney and ureter: Secondary | ICD-10-CM

## 2015-08-08 DIAGNOSIS — C787 Secondary malignant neoplasm of liver and intrahepatic bile duct: Secondary | ICD-10-CM | POA: Diagnosis not present

## 2015-08-08 DIAGNOSIS — R109 Unspecified abdominal pain: Secondary | ICD-10-CM | POA: Diagnosis not present

## 2015-08-08 DIAGNOSIS — R634 Abnormal weight loss: Secondary | ICD-10-CM | POA: Insufficient documentation

## 2015-08-08 DIAGNOSIS — Z90710 Acquired absence of both cervix and uterus: Secondary | ICD-10-CM | POA: Diagnosis not present

## 2015-08-08 DIAGNOSIS — Z952 Presence of prosthetic heart valve: Secondary | ICD-10-CM | POA: Diagnosis not present

## 2015-08-08 DIAGNOSIS — I700 Atherosclerosis of aorta: Secondary | ICD-10-CM | POA: Diagnosis not present

## 2015-08-08 MED ORDER — IOHEXOL 300 MG/ML  SOLN
100.0000 mL | Freq: Once | INTRAMUSCULAR | Status: AC | PRN
  Administered 2015-08-08: 100 mL via INTRAVENOUS

## 2015-08-09 ENCOUNTER — Other Ambulatory Visit: Payer: Self-pay | Admitting: Radiology

## 2015-08-09 ENCOUNTER — Other Ambulatory Visit: Payer: Self-pay | Admitting: *Deleted

## 2015-08-09 ENCOUNTER — Telehealth: Payer: Self-pay | Admitting: Oncology

## 2015-08-09 ENCOUNTER — Telehealth: Payer: Self-pay | Admitting: *Deleted

## 2015-08-09 NOTE — Telephone Encounter (Signed)
TC from patient stating that her US biopsy has been re-scheduled to tomorrow @ 7am. She also has an appt with Dr. Benay Spice tomorrow @ 12noon. She thinks that needs to be re-scheduled. Please call her @ home  [phone]

## 2015-08-09 NOTE — Telephone Encounter (Signed)
Called and left a message with new follow up appointment

## 2015-08-10 ENCOUNTER — Ambulatory Visit (HOSPITAL_COMMUNITY)
Admission: RE | Admit: 2015-08-10 | Discharge: 2015-08-10 | Disposition: A | Discharge status: Home / Self Care | Payer: Medicare Other | Source: Ambulatory Visit | Attending: Oncology | Admitting: Oncology

## 2015-08-10 ENCOUNTER — Ambulatory Visit: Payer: Medicare Other | Admitting: Oncology

## 2015-08-10 ENCOUNTER — Encounter (HOSPITAL_COMMUNITY): Payer: Self-pay

## 2015-08-10 ENCOUNTER — Other Ambulatory Visit: Payer: Medicare Other

## 2015-08-10 DIAGNOSIS — M1990 Unspecified osteoarthritis, unspecified site: Secondary | ICD-10-CM | POA: Insufficient documentation

## 2015-08-10 DIAGNOSIS — M353 Polymyalgia rheumatica: Secondary | ICD-10-CM | POA: Diagnosis not present

## 2015-08-10 DIAGNOSIS — I10 Essential (primary) hypertension: Secondary | ICD-10-CM | POA: Diagnosis not present

## 2015-08-10 DIAGNOSIS — Z952 Presence of prosthetic heart valve: Secondary | ICD-10-CM | POA: Diagnosis not present

## 2015-08-10 DIAGNOSIS — N2889 Other specified disorders of kidney and ureter: Secondary | ICD-10-CM | POA: Diagnosis not present

## 2015-08-10 DIAGNOSIS — K769 Liver disease, unspecified: Secondary | ICD-10-CM | POA: Diagnosis present

## 2015-08-10 DIAGNOSIS — Z8051 Family history of malignant neoplasm of kidney: Secondary | ICD-10-CM | POA: Diagnosis not present

## 2015-08-10 DIAGNOSIS — C229 Malignant neoplasm of liver, not specified as primary or secondary: Secondary | ICD-10-CM | POA: Diagnosis not present

## 2015-08-10 DIAGNOSIS — Z7982 Long term (current) use of aspirin: Secondary | ICD-10-CM | POA: Insufficient documentation

## 2015-08-10 DIAGNOSIS — E785 Hyperlipidemia, unspecified: Secondary | ICD-10-CM | POA: Diagnosis not present

## 2015-08-10 DIAGNOSIS — Z79899 Other long term (current) drug therapy: Secondary | ICD-10-CM | POA: Insufficient documentation

## 2015-08-10 DIAGNOSIS — R160 Hepatomegaly, not elsewhere classified: Secondary | ICD-10-CM | POA: Insufficient documentation

## 2015-08-10 DIAGNOSIS — Z87891 Personal history of nicotine dependence: Secondary | ICD-10-CM | POA: Insufficient documentation

## 2015-08-10 DIAGNOSIS — I739 Peripheral vascular disease, unspecified: Secondary | ICD-10-CM | POA: Diagnosis not present

## 2015-08-10 LAB — CBC WITH DIFFERENTIAL/PLATELET
BASOS ABS: 0 10*3/uL (ref 0.0–0.1)
BASOS PCT: 0 %
EOS ABS: 0.2 10*3/uL (ref 0.0–0.7)
EOS PCT: 2 %
HEMATOCRIT: 38.2 % (ref 36.0–46.0)
Hemoglobin: 13 g/dL (ref 12.0–15.0)
Lymphocytes Relative: 22 %
Lymphs Abs: 2.1 10*3/uL (ref 0.7–4.0)
MCH: 30.2 pg (ref 26.0–34.0)
MCHC: 34 g/dL (ref 30.0–36.0)
MCV: 88.8 fL (ref 78.0–100.0)
MONO ABS: 1.3 10*3/uL — AB (ref 0.1–1.0)
MONOS PCT: 13 %
Neutro Abs: 6 10*3/uL (ref 1.7–7.7)
Neutrophils Relative %: 63 %
PLATELETS: 384 10*3/uL (ref 150–400)
RBC: 4.3 MIL/uL (ref 3.87–5.11)
RDW: 13.6 % (ref 11.5–15.5)
WBC: 9.6 10*3/uL (ref 4.0–10.5)

## 2015-08-10 LAB — COMPREHENSIVE METABOLIC PANEL
ALBUMIN: 3.9 g/dL (ref 3.5–5.0)
ALT: 30 U/L (ref 14–54)
ANION GAP: 10 (ref 5–15)
AST: 35 U/L (ref 15–41)
Alkaline Phosphatase: 108 U/L (ref 38–126)
BILIRUBIN TOTAL: 0.6 mg/dL (ref 0.3–1.2)
BUN: 34 mg/dL — ABNORMAL HIGH (ref 6–20)
CHLORIDE: 93 mmol/L — AB (ref 101–111)
CO2: 27 mmol/L (ref 22–32)
Calcium: 10 mg/dL (ref 8.9–10.3)
Creatinine, Ser: 1.24 mg/dL — ABNORMAL HIGH (ref 0.44–1.00)
GFR calc Af Amer: 45 mL/min — ABNORMAL LOW (ref >60)
GFR calc non Af Amer: 39 mL/min — ABNORMAL LOW (ref >60)
GLUCOSE: 113 mg/dL — AB (ref 65–99)
POTASSIUM: 5 mmol/L (ref 3.5–5.1)
SODIUM: 130 mmol/L — AB (ref 135–145)
TOTAL PROTEIN: 7.9 g/dL (ref 6.5–8.1)

## 2015-08-10 LAB — PROTIME-INR
INR: 0.99 (ref 0.00–1.49)
PROTHROMBIN TIME: 13.3 s (ref 11.6–15.2)

## 2015-08-10 MED ORDER — MIDAZOLAM HCL 2 MG/2ML IJ SOLN
INTRAMUSCULAR | Status: AC | PRN
  Administered 2015-08-10: 0.5 mg via INTRAVENOUS

## 2015-08-10 MED ORDER — FLUMAZENIL 0.5 MG/5ML IV SOLN
INTRAVENOUS | Status: AC
  Filled 2015-08-10: qty 5

## 2015-08-10 MED ORDER — MIDAZOLAM HCL 2 MG/2ML IJ SOLN
INTRAMUSCULAR | Status: AC
  Filled 2015-08-10: qty 4

## 2015-08-10 MED ORDER — NALOXONE HCL 0.4 MG/ML IJ SOLN
INTRAMUSCULAR | Status: AC
  Filled 2015-08-10: qty 1

## 2015-08-10 MED ORDER — SODIUM CHLORIDE 0.9 % IV SOLN
INTRAVENOUS | Status: DC
Start: 2015-08-10 — End: 2015-08-11
  Administered 2015-08-10: 07:00:00 via INTRAVENOUS

## 2015-08-10 MED ORDER — FENTANYL CITRATE (PF) 100 MCG/2ML IJ SOLN
INTRAMUSCULAR | Status: AC
  Filled 2015-08-10: qty 2

## 2015-08-10 MED ORDER — FENTANYL CITRATE (PF) 100 MCG/2ML IJ SOLN
INTRAMUSCULAR | Status: AC | PRN
  Administered 2015-08-10: 25 ug via INTRAVENOUS

## 2015-08-10 NOTE — Discharge Instructions (Signed)
Liver Biopsy, Care After Refer to this sheet in the next few weeks. These instructions provide you with information on caring for yourself after your procedure. Your health care provider may also give you more specific instructions. Your treatment has been planned according to current medical practices, but problems sometimes occur. Call your health care provider if you have any problems or questions after your procedure. WHAT TO EXPECT AFTER THE PROCEDURE After your procedure, it is typical to have the following:  A small amount of discomfort in the area where the biopsy was done and in the right shoulder or shoulder blade.  A small amount of bruising around the area where the biopsy was done and on the skin over the liver.  Sleepiness and fatigue for the rest of the day. HOME CARE INSTRUCTIONS   Rest at home for 1-2 days or as directed by your health care provider.  Have a friend or family member stay with you for at least 24 hours.  Because of the medicines used during the procedure, you should not do the following things in the first 24 hours:  Drive.  Use machinery.  Be responsible for the care of other people.  Sign legal documents.  Take a bath or shower.  There are many different ways to close and cover an incision, including stitches, skin glue, and adhesive strips. Follow your health care provider's instructions on:  Incision care.  Bandage (dressing) changes and removal.  Incision closure removal.  Do not drink alcohol in the first week.  Do not lift more than 5 pounds or play contact sports for 2 weeks after this test.  Take medicines only as directed by your health care provider. Do not take medicine containing aspirin or non-steroidal anti-inflammatory medicines such as ibuprofen for 1 week after this test.  It is your responsibility to get your test results. SEEK MEDICAL CARE IF:   You have increased bleeding from an incision that results in more than a  small spot of blood.  You have redness, swelling, or increasing pain in any incisions.  You notice a discharge or a bad smell coming from any of your incisions.  You have a fever or chills. SEEK IMMEDIATE MEDICAL CARE IF:   You develop swelling, bloating, or pain in your abdomen.  You become dizzy or faint.  You develop a rash.  You are nauseous or vomit.  You have difficulty breathing, feel short of breath, or feel faint.  You develop chest pain.  You have problems with your speech or vision.  You have trouble balancing or moving your arms or legs.   This information is not intended to replace advice given to you by your health care provider. Make sure you discuss any questions you have with your health care provider.   Document Released: 02/21/2005 Document Revised: 08/25/2014 Document Reviewed: 09/30/2013 Elsevier Interactive Patient Education 2016 [employer] Liver Biopsy The liver is a large organ in the upper right-hand side of your abdomen. A liver biopsy is a procedure in which a tissue sample is taken from the liver and examined under a microscope. The procedure is done to confirm a suspected problem. There are three types of liver biopsies:  Percutaneous. In this type, an incision is made in your abdomen. The sample is removed through the incision with a needle.  Laparoscopic. In this type, several incisions are made in the abdomen. A tiny camera is passed through one of the incisions to help guide the health care provider.  The sample is removed through the other incision or incisions. °· Transjugular. In this type, an incision is made in the neck. A tube is passed through the incision to the liver. The sample is removed through the tube with a needle. °LET YOUR HEALTH CARE PROVIDER KNOW ABOUT: °· Any allergies you have. °· All medicines you are taking, including vitamins, herbs, eye drops, creams, and over-the-counter medicines. °· Previous problems you or members of  your family have had with the use of anesthetics. °· Any blood disorders you have. °· Previous surgeries you have had. °· Medical conditions you have. °· Possibility of pregnancy, if this applies. °RISKS AND COMPLICATIONS °Generally, this is a safe procedure. However, problems can occur and include: °· Bleeding. °· Infection. °· Bruising. °· Collapsed lung. °· Leak of digestive juices (bile) from the liver or gallbladder. °· Problems with heart rhythm. °· Pain at the biopsy site or in the right shoulder. °· Low blood pressure (hypotension). °· Injury to nearby organs or tissues. °BEFORE THE PROCEDURE °· Your health care provider may do some blood or urine tests. These will help your health care provider learn how well your kidneys and liver are working and how well your blood clots. °· Ask your health care provider if you will be able to go home the day of the procedure. Arrange for someone to take you home and stay with you for at least 24 hours. °· Do not eat or drink anything after midnight on the night before the procedure or as directed by your health care provider. °· Ask your health care provider about: °· Changing or stopping your regular medicines. This is especially important if you are taking diabetes medicines or blood thinners. °· Taking medicines such as aspirin and ibuprofen. These medicines can thin your blood. Do not take these medicines before your procedure if your health care provider asks you not to. °PROCEDURE °Regardless of the type of biopsy that will be done, you will have an IV line placed. Through this line, you will receive fluids and medicine to relax you. If you will be having a laparoscopic biopsy, you may also receive medicine through this line to make you sleep during the procedure (general anesthetic). °Percutaneous Liver Biopsy °· You will positioned on your back, with your right hand over your head. °· A health care provider will locate your liver by tapping and pressing on the  right side of your abdomen or with the help of an ultrasound machine or CT scan. °· An area at the bottom of your last right rib will be numbed. °· An incision will be made in the numbed area. °· The biopsy needle will be inserted into the incision. °· Several samples of liver tissue will be taken with the biopsy needle. You will be asked to hold your breath as each sample is taken. °Laparoscopic Liver Biopsy °· You will be positioned on your back. °· Several small incisions will be made in your abdomen. °· Your doctor will pass a tiny camera through one incision. The camera will allow the liver to be viewed on a TV monitor in the operating room. °· Tools will be passed through the other incision or incisions. These tools will be used to remove samples of liver tissue. °Transjugular Liver Biopsy °· You will be positioned on your back on an X-ray table, with your head turned to your left. °· An area on your neck just over your jugular vein will be numbed. °· An incision   will be made in the numbed area. °· A tiny tube will be inserted through the incision. It will be pushed through the jugular vein to a blood vessel in the liver called the hepatic vein. °· Dye will be inserted through the tube, and X-rays will be taken. The dye will make the blood vessels in the liver light up on the X-rays. °· The biopsy needle will be pushed through the tube until it reaches the liver. °· Samples of liver tissue will be taken with the biopsy needle. °· The needle and the tube will be removed. °After the samples are obtained, the incision or incisions will be closed. °AFTER THE PROCEDURE °· You will be taken to a recovery area. °· You may have to lie on your right side for 1-2 hours. This will prevent bleeding from the biopsy site. °· Your progress will be watched. Your blood pressure, pulse, and the biopsy site will be checked often. °· You may have some pain or feel sick. If this happens, tell your health care provider. °· As you  begin to feel better, you will be offered ice and beverages. °· You may be allowed to go home when the medicines have worn off and you can walk, drink, eat, and use the bathroom. °  °This information is not intended to replace advice given to you by your health care provider. Make sure you discuss any questions you have with your health care provider. °  °Document Released: 10/25/2003 Document Revised: 08/25/2014 Document Reviewed: 09/30/2013 °Elsevier Interactive Patient Education ©2016 [employer] °Moderate Conscious Sedation, Adult, Care After °Refer to this sheet in the next few weeks. These instructions provide you with information on caring for yourself after your procedure. Your health care provider may also give you more specific instructions. Your treatment has been planned according to current medical practices, but problems sometimes occur. Call your health care provider if you have any problems or questions after your procedure. °WHAT TO EXPECT AFTER THE PROCEDURE  °After your procedure: °· You may feel sleepy, clumsy, and have poor balance for several hours. °· Vomiting may occur if you eat too soon after the procedure. °HOME CARE INSTRUCTIONS °· Do not participate in any activities where you could become injured for at least 24 hours. Do not: °¨ Drive. °¨ Swim. °¨ Ride a bicycle. °¨ Operate heavy machinery. °¨ Cook. °¨ Use power tools. °¨ Climb ladders. °¨ Work from a high place. °· Do not make important decisions or sign legal documents until you are improved. °· If you vomit, drink water, juice, or soup when you can drink without vomiting. Make sure you have little or no nausea before eating solid foods. °· Only take over-the-counter or prescription medicines for pain, discomfort, or fever as directed by your health care provider. °· Make sure you and your family fully understand everything about the medicines given to you, including what side effects may occur. °· You should not drink alcohol,  take sleeping pills, or take medicines that cause drowsiness for at least 24 hours. °· If you smoke, do not smoke without supervision. °· If you are feeling better, you may resume normal activities 24 hours after you were sedated. °· Keep all appointments with your health care provider. °SEEK MEDICAL CARE IF: °· Your skin is pale or bluish in color. °· You continue to feel nauseous or vomit. °· Your pain is getting worse and is not helped by medicine. °· You have bleeding or swelling. °· You are   still sleepy or feeling clumsy after 24 hours. °SEEK IMMEDIATE MEDICAL CARE IF: °· You develop a rash. °· You have difficulty breathing. °· You develop any type of allergic problem. °· You have a fever. °MAKE SURE YOU: °· Understand these instructions. °· Will watch your condition. °· Will get help right away if you are not doing well or get worse. °  °This information is not intended to replace advice given to you by your health care provider. Make sure you discuss any questions you have with your health care provider. °  °Document Released: 05/25/2013 Document Revised: 08/25/2014 Document Reviewed: 05/25/2013 °Elsevier Interactive Patient Education ©2016 [employer] Inc[employer] ° °

## 2015-08-10 NOTE — H&P (Signed)
Chief Complaint: Patient was seen in consultation today for ultrasound-guided liver lesion biopsy  Referring Physician(s): Sherrill,Gary B  History of Present Illness: Kim Donovan is a 81 y.o. female with past medical history significant for hyperlipidemia, osteoarthritis, aortic stenosis with prior aortic valve replacement, polymyalgia rheumatica, peripheral vascular disease, hypertension and positive family history of renal cell carcinoma. Patient has an approximately 2 month history of right-sided abdominal/flank discomfort with subsequent imaging revealing markedly abnormal right kidney with extension of abnormal process medially into the IVC and with adjacent retro-peritoneal adenopathy. In addition there are right-sided liver lesions noted concerning for metastatic disease, small right pleural effusion and biliary duct dilatation. She presents today for ultrasound-guided liver lesion biopsy for further evaluation.  Past Medical History  Diagnosis Date  . Hyperlipidemia   . Osteoarthritis   . Osteoporosis/osteopenia increased risk   . Glaucoma   . Aortic stenosis     myoview neg ischemia 11/2006  ---- echo 11/10: mean AVA gradient~47   ---- s/p bioprosthetic AVR 12/2009  . Polymyalgia rheumatica ([place]) 11/05/2010  . PVD (peripheral vascular disease) ([facility])     left leg swells  . Hypertension     pt. states does not have HTN, no meds    Past Surgical History  Procedure Laterality Date  . Appendectomy    . Tonsillectomy    . Cholecystectomy  1958  . Cardiac valve replacement  May 2011    aortic valve  . Carpal tunnel release  12/2010    left hand  . Cataract/glaucoma surgery  11/2010    [facility]  . Breast surgery  1985    [facility]  . Abdominal hysterectomy      for fibroids  . Mastectomy      was only for removal of cysts, benign  . Eye surgery  01/2011    for glaucoma  . Carpal tunnel release  08/29/2011    Procedure: CARPAL TUNNEL RELEASE;  Surgeon: Cammie Sickle., MD;  Location: [facility];  Service: Orthopedics;  Laterality: Right;    Allergies: Tramadol  Medications: Prior to Admission medications   Medication Sig Start Date End Date Taking? Authorizing Provider  Diphenhydramine-APAP, sleep, (TYLENOL PM EXTRA STRENGTH PO) Take by mouth.   Yes Historical Provider, MD  dorzolamide-timolol (COSOPT) 22.3-6.8 MG/ML ophthalmic solution Place 1 drop into the left eye 2 (two) times daily.    Yes Historical Provider, MD  ezetimibe (ZETIA) 10 MG tablet Take 1 tablet (10 mg total) by mouth daily. 05/12/14  Yes Jolaine Artist, MD  fluconazole (DIFLUCAN) 100 MG tablet Take 1 tablet (100 mg total) by mouth daily. For 5 days 08/06/15  Yes Ladell Pier, MD  furosemide (LASIX) 20 MG tablet Take 1 tablet (20 mg total) by mouth daily. 05/15/15  Yes Larey Dresser, MD  Glucosamine-Chondroitin (OSTEO BI-FLEX REGULAR STRENGTH PO) Take 2 tablets by mouth daily.   Yes Historical Provider, MD  Multiple Vitamins-Minerals (CENTRUM SILVER ULTRA WOMENS PO) Take 1 tablet by mouth daily.     Yes Historical Provider, MD  Omega-3 Fatty Acids (FISH OIL) 1000 MG CAPS Take 1 capsule by mouth daily. Reported on 08/06/2015   Yes Historical Provider, MD  PREDNISONE, PAK, PO Take 2.5 mg by mouth every other day.    Yes Historical Provider, MD  ALPRAZolam (XANAX) 0.25 MG tablet Take 1 tablet (0.25 mg total) by mouth at bedtime as needed for anxiety. 08/06/15   Ladell Pier, MD  amoxicillin (AMOXIL) 500 MG capsule 500 mg. Reported on 08/06/2015    Historical Provider, MD  aspirin 81 MG tablet Take 81 mg by mouth daily.      Historical Provider, MD  Calcium Citrate-Vitamin D 250-200 MG-UNIT TABS Take 2 tablets by mouth daily.     Historical Provider, MD  etanercept (ENBREL SURECLICK) 50 MG/ML injection Inject 50 mg into the skin once a week. Use every Tuesday    Historical Provider, MD  potassium chloride (K-DUR) 10 MEQ tablet Take 1 tablet (10 mEq total) by mouth  daily. Patient not taking: Reported on 08/06/2015 05/15/15   Larey Dresser, MD     Family History  Problem Relation Age of Onset  . Deep vein thrombosis Mother   . Heart attack Father   . Stroke Father   . Osteoporosis      aunt    Social History   Social History  . Marital Status: Married    Spouse Name: N/A  . Number of Children: N/A  . Years of Education: N/A   Social History Main Topics  . Smoking status: Former Smoker -- 0.50 packs/day    Types: Cigarettes    Quit date: 08/18/1978  . Smokeless tobacco: None  . Alcohol Use: No  . Drug Use: None  . Sexual Activity: Not Asked   Other Topics Concern  . None   Social History Narrative   Married   Walks   HH of 2   No pets   Social ocass etoh               Review of Systems  Constitutional: Positive for appetite change. Negative for fever and chills.  Respiratory: Positive for cough. Negative for shortness of breath.   Cardiovascular: Negative for chest pain.  Gastrointestinal: Positive for abdominal pain. Negative for nausea, vomiting and blood in stool.  Genitourinary: Positive for flank pain. Negative for dysuria and hematuria.  Musculoskeletal: Positive for back pain.  Neurological: Negative for headaches.  Psychiatric/Behavioral: The patient is nervous/anxious.     Vital Signs: BP 138/81 mmHg  Pulse 80  Temp(Src) 97.5 F (36.4 C) (Oral)  Resp 20  Ht 5' 6.5" (1.689 m)  Wt 139 lb (63.05 kg)  BMI 22.10 kg/m2  SpO2 100%  Physical Exam  Constitutional: She is oriented to person, place, and time. She appears well-developed and well-nourished.  Cardiovascular: Normal rate and regular rhythm.   Pulmonary/Chest: Effort normal and breath sounds normal.  Abdominal: Soft. Bowel sounds are normal.  Mild rt lateral abd/flank tenderness  Musculoskeletal: Normal range of motion. She exhibits edema.  Neurological: She is alert and oriented to person, place, and time.    Mallampati Score:      Imaging: Ct Abdomen Pelvis W Wo Contrast  08/08/2015  CLINICAL DATA:  Right-sided abdominal pain, abdominal discomfort, weight loss. Right renal mass with liver lesions on MR/ultrasound. EXAM: CT CHEST, ABDOMEN, AND PELVIS WITH CONTRAST TECHNIQUE: Multidetector CT imaging of the chest, abdomen and pelvis was performed following the standard protocol during bolus administration of intravenous contrast. CONTRAST:  18mL OMNIPAQUE IOHEXOL 300 MG/ML  SOLN COMPARISON:  None. FINDINGS: CT CHEST FINDINGS Mediastinum/Nodes: The heart is normal in size. No pericardial effusion. Coronary atherosclerosis. Prosthetic aortic valve. Ectasia of the ascending thoracic aorta, measuring 3.8 cm. Atherosclerotic calcifications of the aortic arch. Scattered small mediastinal lymph nodes, including an 8 mm short axis high right paratracheal node (series 6/ image 19) and a 9 mm short axis subcarinal node (series 6/  image 39). Visualized thyroid is unremarkable. Lungs/Pleura: Lungs are essentially clear. Biapical pleural parenchymal scarring.  No focal consolidation. No suspicious pulmonary nodules. No pleural effusion or pneumothorax. Musculoskeletal: Degenerative changes of the thoracic spine. Thoracic dextroscoliosis. CT ABDOMEN PELVIS FINDINGS Hepatobiliary: 11 mm hypoenhancing lesion in the posterior right hepatic dome (series 6/ image 81), suspicious for metastasis. Associated arterial perfusion anomaly (series 4/image 10). Two suspected hypoenhancing lesions along the posterior aspect of segment 6, measuring 1.7 x 1.9 cm (series 6/image 110) and 1.5 x 1.6 cm (series 6/image 115), suspicious for metastases. Associated arterial perfusion anomaly predominantly involving segment 6 of the liver (series 4/image 22). Status post cholecystectomy. Mild intrahepatic and extrahepatic ductal dilatation, likely secondary to extrinsic compression from the right renal mass. Pancreas: Within normal limits. Spleen: Within normal limits.  Adrenals/Urinary Tract: Left adrenal gland is within normal limits. Right adrenal gland is poorly visualized but likely within normal limits. Left kidney is notable for a 7 mm cyst in the medial left upper pole (series 6/ image 96). Additional 15 mm medial left upper pole renal sinus cyst. No hydronephrosis. 6.3 x 7.7 x 9.7 cm infiltrating hypoenhancing mass replacing the right upper kidney. Mass extends into the right renal vein (series 6/image 95). Additional local extension of tumor into the anterior pararenal space and adjacent to the posterior liver margin (series 6/image 101). Tumor also abuts but does not definitely involve the psoas muscle (series 6/image 104). 2.1 x 1.1 cm fluid collection along the hepatorenal fossa (series 6/ image 100). Stomach/Bowel: Stomach is moderately distended. No evidence of bowel obstruction. Vascular/Lymphatic: Atherosclerotic calcifications of the abdominal aorta and branch vessels. No suspicious abdominopelvic lymphadenopathy. Reproductive: Status post hysterectomy. Left ovary is within normal limits.  No right adnexal mass. Other: No abdominopelvic ascites. Musculoskeletal: Degenerative changes of the lumbar spine. Probable bone island at L5. Lumbar levoscoliosis. IMPRESSION: 9.7 cm infiltrating mass involving the right upper kidney. Extension into the right renal vein. Local extension of tumor into the anterior perirenal space. Three hepatic metastases in segments 6 and 7, measuring up to 1.9 cm. No evidence of metastatic disease in the chest. Additional ancillary findings as above. Electronically Signed   By: Julian Hy M.D.   On: 08/08/2015 14:54   Ct Chest W Contrast  08/08/2015  CLINICAL DATA:  Right-sided abdominal pain, abdominal discomfort, weight loss. Right renal mass with liver lesions on MR/ultrasound. EXAM: CT CHEST, ABDOMEN, AND PELVIS WITH CONTRAST TECHNIQUE: Multidetector CT imaging of the chest, abdomen and pelvis was performed following the  standard protocol during bolus administration of intravenous contrast. CONTRAST:  163mL OMNIPAQUE IOHEXOL 300 MG/ML  SOLN COMPARISON:  None. FINDINGS: CT CHEST FINDINGS Mediastinum/Nodes: The heart is normal in size. No pericardial effusion. Coronary atherosclerosis. Prosthetic aortic valve. Ectasia of the ascending thoracic aorta, measuring 3.8 cm. Atherosclerotic calcifications of the aortic arch. Scattered small mediastinal lymph nodes, including an 8 mm short axis high right paratracheal node (series 6/ image 19) and a 9 mm short axis subcarinal node (series 6/ image 39). Visualized thyroid is unremarkable. Lungs/Pleura: Lungs are essentially clear. Biapical pleural parenchymal scarring.  No focal consolidation. No suspicious pulmonary nodules. No pleural effusion or pneumothorax. Musculoskeletal: Degenerative changes of the thoracic spine. Thoracic dextroscoliosis. CT ABDOMEN PELVIS FINDINGS Hepatobiliary: 11 mm hypoenhancing lesion in the posterior right hepatic dome (series 6/ image 81), suspicious for metastasis. Associated arterial perfusion anomaly (series 4/image 10). Two suspected hypoenhancing lesions along the posterior aspect of segment 6, measuring 1.7 x 1.9 cm (series 6/image  110) and 1.5 x 1.6 cm (series 6/image 115), suspicious for metastases. Associated arterial perfusion anomaly predominantly involving segment 6 of the liver (series 4/image 22). Status post cholecystectomy. Mild intrahepatic and extrahepatic ductal dilatation, likely secondary to extrinsic compression from the right renal mass. Pancreas: Within normal limits. Spleen: Within normal limits. Adrenals/Urinary Tract: Left adrenal gland is within normal limits. Right adrenal gland is poorly visualized but likely within normal limits. Left kidney is notable for a 7 mm cyst in the medial left upper pole (series 6/ image 96). Additional 15 mm medial left upper pole renal sinus cyst. No hydronephrosis. 6.3 x 7.7 x 9.7 cm infiltrating  hypoenhancing mass replacing the right upper kidney. Mass extends into the right renal vein (series 6/image 95). Additional local extension of tumor into the anterior pararenal space and adjacent to the posterior liver margin (series 6/image 101). Tumor also abuts but does not definitely involve the psoas muscle (series 6/image 104). 2.1 x 1.1 cm fluid collection along the hepatorenal fossa (series 6/ image 100). Stomach/Bowel: Stomach is moderately distended. No evidence of bowel obstruction. Vascular/Lymphatic: Atherosclerotic calcifications of the abdominal aorta and branch vessels. No suspicious abdominopelvic lymphadenopathy. Reproductive: Status post hysterectomy. Left ovary is within normal limits.  No right adnexal mass. Other: No abdominopelvic ascites. Musculoskeletal: Degenerative changes of the lumbar spine. Probable bone island at L5. Lumbar levoscoliosis. IMPRESSION: 9.7 cm infiltrating mass involving the right upper kidney. Extension into the right renal vein. Local extension of tumor into the anterior perirenal space. Three hepatic metastases in segments 6 and 7, measuring up to 1.9 cm. No evidence of metastatic disease in the chest. Additional ancillary findings as above. Electronically Signed   By: Julian Hy M.D.   On: 08/08/2015 14:54   Mr Abdomen W Wo Contrast  08/03/2015  CLINICAL DATA:  Ultrasound demonstrating lack of right-sided renal sinus fat. Possible mass. Right upper quadrant pain. EXAM: MRI ABDOMEN WITHOUT AND WITH CONTRAST TECHNIQUE: Multiplanar multisequence MR imaging of the abdomen was performed both before and after the administration of intravenous contrast. CONTRAST:  25mL MULTIHANCE GADOBENATE DIMEGLUMINE 529 MG/ML IV SOLN COMPARISON:  Ultrasound of 07/30/2015.  CT of 01/07/2007. FINDINGS: Mild motion degradation throughout. Lower chest: Cardiomegaly. Median sternotomy. Small right pleural effusion. Hepatobiliary: Multiple right-sided liver lesions. Subcapsular  posterior right hepatic lobe 1.3 cm is T2 hyperintense and hypo enhancing, including on image 28/series 4. A subcapsular anterior segment right liver lobe 1.5 cm lesion is identified on image 44/series 903. Cholecystectomy. The intrahepatic ducts are mildly prominent. The common duct measures up to 1.5 cm in the porta hepatis on image 12/ series 7. Tapers to 8 mm in thepancreatic head. Suspect mass-effect secondary to the right-sided retroperitoneal process. Pancreas: Normal, without mass or ductal dilatation. Spleen: Normal in size, without focal abnormality. Adrenals/Urinary Tract: Normal left adrenal gland. Right adrenal not well evaluated. A too small to characterize left renal lesion. Markedly abnormal appearance of the right kidney. Mildly enlarged, with dilated renal collecting systems and renal pelvis. Possible renal pelvic stone on image 24/series 7. Areas of precontrast T1 hyperintensity within are suspicious for hemorrhage. Example series 900. Markedly diminished post-contrast enhancement throughout the right kidney. Surrounding perinephric edema and cyst early post-contrast hyperemia in the adjacent right hepatic lobe. Example image 36/ series 901. Stomach/Bowel: Mild gastric distension. Normal caliber of abdominal bowel loops. Vascular/Lymphatic: Normal caliber of the aorta and branch vessels. Suspect extension of the right renal process (likely tumor) into the IVC, including on image 14 of series 4.  The more inferior IVC is also hypo enhancing on image 44/ series 904. Limited evaluation for retroperitoneal adenopathy. Soft tissue fullness in the aortocaval space is suspicious on image 41/ series 904. Other: No abdominal ascites. Musculoskeletal: S-shaped thoracolumbar spine curvature. IMPRESSION: 1. Markedly abnormal appearance of the right kidney. Enlarged with marked hypo enhancement. Extension of this process medially into the IVC with probable adjacent retroperitoneal adenopathy. Findings are  suspicious for infiltrative renal neoplasm with venous extension. Given concurrent caliectasis and possible obstructing renal pelvic stone, an infectious process such as xanthogranulomatous pyelonephritis is possible but felt less likely. Given motion on current exam, consider further evaluation with hematuria protocol abdominal pelvic pre and post contrast CT. These results will be called to the ordering clinician or representative by the Radiologist Assistant, and communication documented in the PACS or zVision Dashboard. 2. Right-sided liver lesions which are suspicious for metastatic disease, presuming the renal process is neoplasm. Extension of infection felt less likely. 3. Small right pleural effusion. 4. Cholecystectomy with biliary duct dilatation. Question mass effect by the right-sided retroperitoneal process. Recommend attention on follow-up. Electronically Signed   By: Abigail Miyamoto M.D.   On: 08/03/2015 13:43   US Abdomen Complete  07/30/2015  CLINICAL DATA:  Right upper quadrant pain EXAM: ULTRASOUND ABDOMEN COMPLETE COMPARISON:  None. FINDINGS: Gallbladder: Prior cholecystectomy Common bile duct: Diameter: Normal caliber a for post cholecystectomy state as 7 mm, tapering to 4 mm distally. Liver: Small echogenic focus in the right hepatic lobe measures 6 mm, likely small hemangioma. Small right hepatic cysts, the largest 2.5 cm. IVC: No abnormality visualized. Pancreas: Visualized portion unremarkable. Spleen: Size and appearance within normal limits. Right Kidney: Length: 10.0 cm. Heterogeneous, echogenic appearance throughout the entire right kidney with loss of renal sinus. Cannot exclude infiltrating mass. Left Kidney: Length: 12.1 cm. Echogenicity within normal limits. No mass or hydronephrosis visualized. Abdominal aorta: No aneurysm visualized. Other findings: None. IMPRESSION: Echogenic, heterogeneous appearance of the right kidney with loss of the renal sinus. Cannot exclude infiltrating  mass. Consider further evaluation with CT or MRI with and without contrast. Prior cholecystectomy. Electronically Signed   By: Rolm Baptise M.D.   On: 07/30/2015 10:34    Labs:  CBC:  Recent Labs  05/15/15 1115 08/10/15 0715  WBC 6.3 9.6  HGB 13.7 13.0  HCT 41.1 38.2  PLT 237 384    COAGS:  Recent Labs  08/10/15 0715  INR 0.99    BMP:  Recent Labs  05/15/15 1115 05/29/15 0900 06/15/15 0955 08/03/15 1242 08/06/15 1107 08/10/15 0715  NA 138 137 134*  --  132* 130*  K 4.2 4.5 4.6  --  4.7 5.0  CL 102 98* 97*  --   --  93*  CO2 28 30 28   --  30* 27  GLUCOSE 114* 103* 107*  --  98 113*  BUN 19 28* 32*  --  24.6 34*  CALCIUM 10.3 9.7 10.1  --  10.2 10.0  CREATININE 1.08* 1.22* 1.23* 1.00 1.2* 1.24*  GFRNONAA 46* 40* 39*  --   --  39*  GFRAA 53* 46* 45*  --   --  45*    LIVER FUNCTION TESTS:  Recent Labs  08/06/15 1107 08/10/15 0715  BILITOT 0.43 0.6  AST 34 35  ALT 30 30  ALKPHOS 105 108  PROT 8.3 7.9  ALBUMIN 3.6 3.9    TUMOR MARKERS: No results for input(s): AFPTM, CEA, CA199, CHROMGRNA in the last 8760 hours.  Assessment  and Plan: 81 y.o. female with past medical history significant for hyperlipidemia, osteoarthritis, aortic stenosis with prior aortic valve replacement, polymyalgia rheumatica, peripheral vascular disease, hypertension and positive family history of renal cell carcinoma. Patient has an approximately 2 month history of right-sided abdominal/flank discomfort with subsequent imaging revealing markedly abnormal right kidney with extension of abnormal process medially into the IVC and with adjacent retro-peritoneal adenopathy. In addition there are right-sided liver lesions noted concerning for metastatic disease, small right pleural effusion and biliary duct dilatation. She presents today for ultrasound-guided liver lesion biopsy for further evaluation.Risks and benefits discussed with the patient including, but not limited to bleeding,  infection, damage to adjacent structures or low yield requiring additional tests. All of the patient's questions were answered, patient is agreeable to proceed.Consent signed and in chart.       Thank you for this interesting consult.  I greatly enjoyed meeting Kim Donovan and look forward to participating in their care.  A copy of this report was sent to the requesting provider on this date.  Signed: D. Rowe Robert 08/10/2015, 8:26 AM   I spent a total of 15 minutes in face to face in clinical consultation, greater than 50% of which was counseling/coordinating care for ultrasound-guided liver lesion biopsy

## 2015-08-10 NOTE — Procedures (Signed)
Technically successful US guided biopsy of ill defined lesion in the caudal aspect of the right lobe of the liver.   No immediate complications.   Ronny Bacon, MD Pager #: [phone]

## 2015-08-14 ENCOUNTER — Other Ambulatory Visit: Payer: Self-pay | Admitting: *Deleted

## 2015-08-14 ENCOUNTER — Telehealth: Payer: Self-pay | Admitting: Oncology

## 2015-08-14 ENCOUNTER — Ambulatory Visit (HOSPITAL_BASED_OUTPATIENT_CLINIC_OR_DEPARTMENT_OTHER): Payer: Medicare Other | Admitting: Oncology

## 2015-08-14 ENCOUNTER — Other Ambulatory Visit: Payer: Self-pay | Admitting: Oncology

## 2015-08-14 VITALS — BP 147/86 | HR 89 | Temp 98.2°F | Resp 18 | Ht 66.5 in | Wt 140.9 lb

## 2015-08-14 DIAGNOSIS — C641 Malignant neoplasm of right kidney, except renal pelvis: Secondary | ICD-10-CM

## 2015-08-14 DIAGNOSIS — C787 Secondary malignant neoplasm of liver and intrahepatic bile duct: Secondary | ICD-10-CM | POA: Diagnosis not present

## 2015-08-14 MED ORDER — PAZOPANIB HCL 200 MG PO TABS: 800.0000 mg | ORAL_TABLET | Freq: Every day | ORAL | Status: DC

## 2015-08-14 NOTE — Progress Notes (Signed)
  [facility] OFFICE PROGRESS NOTE   Diagnosis: Renal cell carcinoma  INTERVAL HISTORY:   Kim Donovan underwent staging CT scans 08/08/2015. A 9.7 cm mass was noted in the right kidney with extension into the right renal vein. 3 apparent metastases were noted in the liver. No evidence of metastatic disease in the chest.  She underwent an ultrasound-guided biopsy of a 1.6 cm lesion in the caudal aspect of the right lobe of the liver on 08/10/2015. Preliminary review of the pathology is consistent with renal cell carcinoma.  She reports a single episode of nausea and vomiting in the early a.m. on 08/11/2015. No other nausea. No pain.  Objective:  Vital signs in last 24 hours:  Blood pressure 147/86, pulse 89, temperature 98.2 F (36.8 C), temperature source Oral, resp. rate 18, height 5' 6.5" (1.689 m), weight 140 lb 14.4 oz (63.912 kg), SpO2 100 %.    Resp: Lungs clear bilaterally Cardio: Regular rate and rhythm GI: No hepatomegaly, nontender, no mass Vascular: No leg edema   Lab Results:  Lab Results  Component Value Date   WBC 9.6 08/10/2015   HGB 13.0 08/10/2015   HCT 38.2 08/10/2015   MCV 88.8 08/10/2015   PLT 384 08/10/2015   NEUTROABS 6.0 08/10/2015     Medications: I have reviewed the patient's current medications.  Assessment/Plan: 1. Right renal mass  MRI 08/03/2015 with multiple suspicious liver lesions, replacement of the right kidney consistent with an infiltrative neoplasm, probable rectoperineal adenopathy, extension of tumor into the IVC, small right pleural effusion  Staging CTs 08/08/2015 with no lung metastases, infiltrative right renal mass extending into the right renal vein and pararenal space, no lymphadenopathy, liver metastases  Ultrasound-guided biopsy of a right liver lesion 08/10/2015 with the preliminary pathology consistent with metastatic renal cell carcinoma   2. Right abdomen/flank discomfort secondary to #1  3. Family  history of renal cell cancer  4. Anorexia/weight loss secondary to #1  5. Status post aortic valve replacement  6.Polymyalgia rheumatica- [facility] on hold    Disposition:  Kim Donovan has been diagnosed with metastatic renal cell carcinoma. I discussed treatment options with Kim Donovan and her family. I discussed the case with urology. A nephrectomy is not recommended at present. I recommend a trial of systemic therapy with pazopanib,  We reviewed the potential toxicities associated with pazopanib including the chance for nausea, diarrhea, rash, hand/foot syndrome, hematologic toxicity, venous/arterial thromboembolic disease, bowel perforation, hypertension, hypothyroidism, change of hair color, and liver toxicity. She was given reading materials on pazopanib. She agrees to proceed.  Kim Donovan will return for an office and lab visit in approximately 2 weeks. She will contact us in the interim for new symptoms.  Betsy Coder, MD  08/14/2015  3:42 PM

## 2015-08-14 NOTE — Patient Instructions (Signed)
Pazopanib oral tablets What is this medicine? PAZOPANIB (paz [place] pa nib) is a biologic drug used to treat kidney cancer and sarcoma. This medicine may be used for other purposes; ask your health care provider or pharmacist if you have questions. What should I tell my health care provider before I take this medicine? They need to know if you have any of these conditions: -bleeding problems -have had recent surgery (within 7 days) or are having surgery -heart disease -high blood pressure -history of stroke -liver disease -protein in your urine -stomach or intestine problems -thyroid problems -an unusual or allergic reaction to pazopanib, other medicines, foods, dyes, or preservatives -pregnant or trying to get pregnant -breast-feeding How should I use this medicine? Take this medicine by mouth with a glass of water. Follow the directions on the prescription label. Do not cut, crush or chew this medicine. Take this medicine on an empty stomach, at least 1 hour before or 2 hours after meals. Do not take with food. Do not take with grapefruit juice. Take your medicine at regular intervals. Do not take it more often than directed. Do not stop taking except on your doctor's advice. A special MedGuide will be given to you by the pharmacist with each prescription and refill. Be sure to read this information carefully each time. Talk to your pediatrician regarding the use of this medicine in children. Special care may be needed. Overdosage: If you think you have taken too much of this medicine contact a poison control center or emergency room at once. NOTE: This medicine is only for you. Do not share this medicine with others. What if I miss a dose? If you miss a dose, take it as soon as you can. If your next dose is to be taken in less than 12 hours, then do not take the missed dose. Take the next dose at your regular time. Do not take double or extra doses. What may interact with this medicine? Do  not take this medication with any of the following medications: -rifampin This medicine may also interact with the following medications: -certain medicines for stomach problems like cimetidine, famotidine, omeprazole, lansoprazole -clarithromycin -dextromethorphan -grapefruit or grapefruit juice -ketoconazole -lapatinib -certain medicines for irregular heart beat like amiodarone, bepridil, dofetilide, encainide, flecainide, propafenone, quinidine -midazolam -paclitaxel -ritonavir This list may not describe all possible interactions. Give your health care provider a list of all the medicines, herbs, non-prescription drugs, or dietary supplements you use. Also tell them if you smoke, drink alcohol, or use illegal drugs. Some items may interact with your medicine. What should I watch for while using this medicine? Visit your doctor for regular check ups. You will need to have blood work while you are taking this medicine. Call your doctor or health care professional for advice if you get a fever, chills or sore throat, or other symptoms of a cold or flu. Do not treat yourself. This drug decreases your body's ability to fight infections. Try to avoid being around people who are sick. This medicine may increase your risk to bruise or bleed. Call your doctor or health care professional if you notice any unusual bleeding. Do not become pregnant while taking this medicine or for 2 weeks after stopping it. Women should inform their doctor if they wish to become pregnant or think they might be pregnant. There is a potential for serious side effects to an unborn child. Talk to your health care professional or pharmacist for more information. Do not breast-feed an   infant while taking this medicine. This medicine may interfere with the ability to have a child. You should talk with your doctor or health care professional if you are concerned about your fertility. If you are going to have surgery or any other  procedures, tell your doctor you are taking this medicine. What side effects may I notice from receiving this medicine? Side effects that you should report to your doctor or health care professional as soon as possible: -abdominal pain -allergic reactions like skin rash, itching or hives, swelling of the face, lips, or tongue -black, tarry stool -breathing problems -changes in vision -chest pain -confusion, trouble speaking or understanding -cough -fast, irregular heartbeat -fever or chills, sore throat -seizures -severe headaches -shortness or breath -sudden numbness or weakness of the face, arm or leg -trouble passing urine or change in the amount of urine -trouble walking, dizziness, loss of balance or coordination -unusual bleeding or bruising -unusually weak or tired -yellowing of the eyes or skin Side effects that usually do not require medical attention (Report these to your doctor or health care professional if they continue or are bothersome.): -change in hair color -loose or watery stool -loss of appetite -nausea, vomiting -unusually weak or tired This list may not describe all possible side effects. Call your doctor for medical advice about side effects. You may report side effects to FDA at 1-800-FDA-1088. Where should I keep my medicine? Keep out of the reach of children. Store at room temperature between 15 and 30 degrees C (59 and 86 degrees F). Throw away any unused medicine after the expiration date. NOTE: This sheet is a summary. It may not cover all possible information. If you have questions about this medicine, talk to your doctor, pharmacist, or health care provider.    2016, [employer]. (2015-01-11 22:23:01)  

## 2015-08-14 NOTE — Telephone Encounter (Signed)
Gave patient avs report and appointments for January  °

## 2015-08-15 ENCOUNTER — Encounter: Payer: Self-pay | Admitting: Skilled Nursing Facility1

## 2015-08-15 ENCOUNTER — Ambulatory Visit (HOSPITAL_COMMUNITY): Payer: Medicare Other

## 2015-08-15 ENCOUNTER — Telehealth: Payer: Self-pay | Admitting: Pharmacist

## 2015-08-15 ENCOUNTER — Ambulatory Visit: Payer: Medicare Other | Admitting: Oncology

## 2015-08-15 NOTE — Telephone Encounter (Signed)
08/15/15 - Rx for Votrient sent to [facility]. Votrient requires PA. Prior Auth obtained from [employer] through 08/14/2016. Case Number: [id number]. [facility] will complete benefits analysis

## 2015-08-15 NOTE — Progress Notes (Signed)
Subjective:     Patient ID: Kim Donovan, female   DOB: 01/07/1929, 81 y.o.   MRN: [id number]  HPI   Review of Systems     Objective:   Physical Exam To assist the pt in identifying dietary approaches to gain lost wt back.    Assessment:     Pt identified as being malnourished due to lost wt. Pt was contacted via the telephone at [phone] but the pt was unavailable.     Plan:     Dietitian left a voicemail prompting the pt to contact Ernestene Kiel CSO,RD,LDN at her earliest conveinance.

## 2015-08-19 ENCOUNTER — Inpatient Hospital Stay (HOSPITAL_COMMUNITY)
Admission: EM | Admit: 2015-08-19 | Discharge: 2015-09-06 | DRG: 871 | Disposition: A | Discharge status: Skilled Nursing Facility | Payer: Medicare Other | Attending: Internal Medicine | Admitting: Internal Medicine

## 2015-08-19 ENCOUNTER — Encounter (HOSPITAL_COMMUNITY): Payer: Self-pay | Admitting: Oncology

## 2015-08-19 DIAGNOSIS — C641 Malignant neoplasm of right kidney, except renal pelvis: Secondary | ICD-10-CM | POA: Diagnosis present

## 2015-08-19 DIAGNOSIS — J189 Pneumonia, unspecified organism: Secondary | ICD-10-CM | POA: Diagnosis present

## 2015-08-19 DIAGNOSIS — R609 Edema, unspecified: Secondary | ICD-10-CM

## 2015-08-19 DIAGNOSIS — K91871 Postprocedural hematoma of a digestive system organ or structure following other procedure: Secondary | ICD-10-CM | POA: Diagnosis present

## 2015-08-19 DIAGNOSIS — E871 Hypo-osmolality and hyponatremia: Secondary | ICD-10-CM | POA: Diagnosis present

## 2015-08-19 DIAGNOSIS — I5032 Chronic diastolic (congestive) heart failure: Secondary | ICD-10-CM | POA: Diagnosis present

## 2015-08-19 DIAGNOSIS — Z4659 Encounter for fitting and adjustment of other gastrointestinal appliance and device: Secondary | ICD-10-CM

## 2015-08-19 DIAGNOSIS — Z79899 Other long term (current) drug therapy: Secondary | ICD-10-CM

## 2015-08-19 DIAGNOSIS — Z6821 Body mass index (BMI) 21.0-21.9, adult: Secondary | ICD-10-CM

## 2015-08-19 DIAGNOSIS — H409 Unspecified glaucoma: Secondary | ICD-10-CM | POA: Diagnosis present

## 2015-08-19 DIAGNOSIS — R198 Other specified symptoms and signs involving the digestive system and abdomen: Secondary | ICD-10-CM

## 2015-08-19 DIAGNOSIS — E43 Unspecified severe protein-calorie malnutrition: Secondary | ICD-10-CM | POA: Diagnosis present

## 2015-08-19 DIAGNOSIS — R160 Hepatomegaly, not elsewhere classified: Secondary | ICD-10-CM | POA: Diagnosis present

## 2015-08-19 DIAGNOSIS — Z7952 Long term (current) use of systemic steroids: Secondary | ICD-10-CM

## 2015-08-19 DIAGNOSIS — N183 Chronic kidney disease, stage 3 (moderate): Secondary | ICD-10-CM | POA: Diagnosis present

## 2015-08-19 DIAGNOSIS — Z823 Family history of stroke: Secondary | ICD-10-CM

## 2015-08-19 DIAGNOSIS — Z66 Do not resuscitate: Secondary | ICD-10-CM | POA: Diagnosis present

## 2015-08-19 DIAGNOSIS — R112 Nausea with vomiting, unspecified: Secondary | ICD-10-CM

## 2015-08-19 DIAGNOSIS — Z885 Allergy status to narcotic agent status: Secondary | ICD-10-CM

## 2015-08-19 DIAGNOSIS — R7989 Other specified abnormal findings of blood chemistry: Secondary | ICD-10-CM | POA: Diagnosis present

## 2015-08-19 DIAGNOSIS — R7401 Elevation of levels of liver transaminase levels: Secondary | ICD-10-CM | POA: Diagnosis not present

## 2015-08-19 DIAGNOSIS — K209 Esophagitis, unspecified: Secondary | ICD-10-CM | POA: Diagnosis present

## 2015-08-19 DIAGNOSIS — C801 Malignant (primary) neoplasm, unspecified: Secondary | ICD-10-CM

## 2015-08-19 DIAGNOSIS — E876 Hypokalemia: Secondary | ICD-10-CM | POA: Diagnosis not present

## 2015-08-19 DIAGNOSIS — J690 Pneumonitis due to inhalation of food and vomit: Secondary | ICD-10-CM | POA: Diagnosis present

## 2015-08-19 DIAGNOSIS — E860 Dehydration: Secondary | ICD-10-CM | POA: Diagnosis present

## 2015-08-19 DIAGNOSIS — I739 Peripheral vascular disease, unspecified: Secondary | ICD-10-CM | POA: Diagnosis present

## 2015-08-19 DIAGNOSIS — Z87891 Personal history of nicotine dependence: Secondary | ICD-10-CM

## 2015-08-19 DIAGNOSIS — Z90710 Acquired absence of both cervix and uterus: Secondary | ICD-10-CM

## 2015-08-19 DIAGNOSIS — R1110 Vomiting, unspecified: Secondary | ICD-10-CM

## 2015-08-19 DIAGNOSIS — M353 Polymyalgia rheumatica: Secondary | ICD-10-CM | POA: Diagnosis present

## 2015-08-19 DIAGNOSIS — A419 Sepsis, unspecified organism: Secondary | ICD-10-CM | POA: Diagnosis not present

## 2015-08-19 DIAGNOSIS — C787 Secondary malignant neoplasm of liver and intrahepatic bile duct: Secondary | ICD-10-CM | POA: Diagnosis present

## 2015-08-19 DIAGNOSIS — R0682 Tachypnea, not elsewhere classified: Secondary | ICD-10-CM | POA: Diagnosis present

## 2015-08-19 DIAGNOSIS — I10 Essential (primary) hypertension: Secondary | ICD-10-CM | POA: Diagnosis present

## 2015-08-19 DIAGNOSIS — K315 Obstruction of duodenum: Secondary | ICD-10-CM | POA: Diagnosis present

## 2015-08-19 DIAGNOSIS — I350 Nonrheumatic aortic (valve) stenosis: Secondary | ICD-10-CM | POA: Diagnosis present

## 2015-08-19 DIAGNOSIS — R933 Abnormal findings on diagnostic imaging of other parts of digestive tract: Secondary | ICD-10-CM | POA: Insufficient documentation

## 2015-08-19 DIAGNOSIS — K838 Other specified diseases of biliary tract: Secondary | ICD-10-CM | POA: Insufficient documentation

## 2015-08-19 DIAGNOSIS — R945 Abnormal results of liver function studies: Secondary | ICD-10-CM

## 2015-08-19 DIAGNOSIS — I130 Hypertensive heart and chronic kidney disease with heart failure and stage 1 through stage 4 chronic kidney disease, or unspecified chronic kidney disease: Secondary | ICD-10-CM | POA: Diagnosis present

## 2015-08-19 DIAGNOSIS — Z952 Presence of prosthetic heart valve: Secondary | ICD-10-CM

## 2015-08-19 DIAGNOSIS — R188 Other ascites: Secondary | ICD-10-CM | POA: Diagnosis present

## 2015-08-19 DIAGNOSIS — N2889 Other specified disorders of kidney and ureter: Secondary | ICD-10-CM

## 2015-08-19 DIAGNOSIS — R509 Fever, unspecified: Secondary | ICD-10-CM

## 2015-08-19 DIAGNOSIS — K831 Obstruction of bile duct: Secondary | ICD-10-CM | POA: Diagnosis present

## 2015-08-19 DIAGNOSIS — N179 Acute kidney failure, unspecified: Secondary | ICD-10-CM | POA: Diagnosis present

## 2015-08-19 DIAGNOSIS — E785 Hyperlipidemia, unspecified: Secondary | ICD-10-CM | POA: Diagnosis present

## 2015-08-19 DIAGNOSIS — K832 Perforation of bile duct: Secondary | ICD-10-CM | POA: Diagnosis present

## 2015-08-19 DIAGNOSIS — Z9049 Acquired absence of other specified parts of digestive tract: Secondary | ICD-10-CM

## 2015-08-19 DIAGNOSIS — K311 Adult hypertrophic pyloric stenosis: Secondary | ICD-10-CM | POA: Diagnosis present

## 2015-08-19 DIAGNOSIS — R740 Nonspecific elevation of levels of transaminase and lactic acid dehydrogenase [LDH]: Secondary | ICD-10-CM | POA: Diagnosis present

## 2015-08-19 DIAGNOSIS — M1990 Unspecified osteoarthritis, unspecified site: Secondary | ICD-10-CM | POA: Diagnosis present

## 2015-08-19 DIAGNOSIS — R748 Abnormal levels of other serum enzymes: Secondary | ICD-10-CM | POA: Diagnosis present

## 2015-08-19 DIAGNOSIS — Y848 Other medical procedures as the cause of abnormal reaction of the patient, or of later complication, without mention of misadventure at the time of the procedure: Secondary | ICD-10-CM | POA: Diagnosis present

## 2015-08-19 DIAGNOSIS — M7989 Other specified soft tissue disorders: Secondary | ICD-10-CM | POA: Diagnosis present

## 2015-08-19 DIAGNOSIS — Z7982 Long term (current) use of aspirin: Secondary | ICD-10-CM

## 2015-08-19 DIAGNOSIS — Z8249 Family history of ischemic heart disease and other diseases of the circulatory system: Secondary | ICD-10-CM

## 2015-08-19 HISTORY — DX: Malignant neoplasm of unspecified kidney, except renal pelvis: C64.9

## 2015-08-19 LAB — COMPREHENSIVE METABOLIC PANEL
ALK PHOS: 181 U/L — AB (ref 38–126)
ALT: 59 U/L — AB (ref 14–54)
ANION GAP: 12 (ref 5–15)
AST: 59 U/L — ABNORMAL HIGH (ref 15–41)
Albumin: 3.5 g/dL (ref 3.5–5.0)
BUN: 24 mg/dL — ABNORMAL HIGH (ref 6–20)
CALCIUM: 9.6 mg/dL (ref 8.9–10.3)
CO2: 27 mmol/L (ref 22–32)
CREATININE: 1.19 mg/dL — AB (ref 0.44–1.00)
Chloride: 91 mmol/L — ABNORMAL LOW (ref 101–111)
GFR, EST AFRICAN AMERICAN: 47 mL/min — AB (ref >60)
GFR, EST NON AFRICAN AMERICAN: 41 mL/min — AB (ref >60)
Glucose, Bld: 107 mg/dL — ABNORMAL HIGH (ref 65–99)
Potassium: 4.1 mmol/L (ref 3.5–5.1)
Sodium: 130 mmol/L — ABNORMAL LOW (ref 135–145)
Total Bilirubin: 0.9 mg/dL (ref 0.3–1.2)
Total Protein: 7.8 g/dL (ref 6.5–8.1)

## 2015-08-19 LAB — CBC
HCT: 36.3 % (ref 36.0–46.0)
Hemoglobin: 12.4 g/dL (ref 12.0–15.0)
MCH: 30.1 pg (ref 26.0–34.0)
MCHC: 34.2 g/dL (ref 30.0–36.0)
MCV: 88.1 fL (ref 78.0–100.0)
PLATELETS: 342 10*3/uL (ref 150–400)
RBC: 4.12 MIL/uL (ref 3.87–5.11)
RDW: 13.3 % (ref 11.5–15.5)
WBC: 14.4 10*3/uL — AB (ref 4.0–10.5)

## 2015-08-19 LAB — LIPASE, BLOOD: LIPASE: 50 U/L (ref 11–51)

## 2015-08-19 MED ORDER — SODIUM CHLORIDE 0.9 % IV BOLUS (SEPSIS)
1000.0000 mL | Freq: Once | INTRAVENOUS | Status: AC
  Administered 2015-08-19: 1000 mL via INTRAVENOUS

## 2015-08-19 MED ORDER — ONDANSETRON HCL 4 MG/2ML IJ SOLN
4.0000 mg | Freq: Once | INTRAMUSCULAR | Status: AC
  Administered 2015-08-19: 4 mg via INTRAVENOUS
  Filled 2015-08-19: qty 2

## 2015-08-19 NOTE — ED Notes (Signed)
Pt would like something for nausea and then she will attempt to give a urine sample.

## 2015-08-19 NOTE — ED Notes (Signed)
Bed: WA17 Expected date:  Expected time:  Means of arrival:  Comments: Hold for [facility]

## 2015-08-19 NOTE — ED Provider Notes (Signed)
CSN: [id number]     Arrival date & time 08/19/15  2136 History  By signing my name below, I, Soijett Blue, attest that this documentation has been prepared under the direction and in the presence of Nasri Boakye, MD. Electronically Signed: Soijett Blue, ED Scribe. 08/19/2015. 11:42 PM.   Chief Complaint  Patient presents with  . Emesis      Patient is a 82 y.o. female presenting with vomiting. The history is provided by the patient. No language interpreter was used.  Emesis Severity:  Moderate Duration:  3 days Timing:  Intermittent Quality:  Stomach contents Progression:  Unchanged Chronicity:  New Recent urination:  Normal Context: not post-tussive and not self-induced   Relieved by:  Nothing Worsened by:  Nothing tried Ineffective treatments:  None tried Associated symptoms: cough and fever   Associated symptoms: no abdominal pain, no diarrhea, no myalgias and no sore throat   Risk factors: no travel to endemic areas     HPI Comments: Kim Donovan is a 82 y.o. female with a medical hx of HTN who presents to the Emergency Department complaining of vomiting onset 3 days. Pt relative, Cardiologist, Dr. [name] notes that the pt felt better yesterday and she had a temperature of 101.8 yesterday and tonight PTA. Dr. [name] states that the pt recently had a liver surgery, and the pt notes that she is not having any pain to the surgerical site at this time.   She states that she is having associated symptoms of nausea, fever, and dry non-productive cough. Pt reports that she had two bowel movements today. She denies urinary retention, dysuria, hematuria, sore throat, myalgias, diarrhea, rash, ear pain, itchy/watery eyes, difficulty swallowing, blood in stool, abdominal pain, and any other symptoms. Pt was recently on diflucan for thrush last week. She reports that she takes a daily ASA and that she alternates days on which she takes prednisone.    Past Medical History  Diagnosis Date  .  Hyperlipidemia   . Osteoarthritis   . Osteoporosis/osteopenia increased risk   . Glaucoma   . Aortic stenosis     myoview neg ischemia 11/2006  ---- echo 11/10: mean AVA gradient~47   ---- s/p bioprosthetic AVR 12/2009  . Polymyalgia rheumatica ([place]) 11/05/2010  . PVD (peripheral vascular disease) (HCC)     left leg swells  . Hypertension     pt. states does not have HTN, no meds   Past Surgical History  Procedure Laterality Date  . Appendectomy    . Tonsillectomy    . Cholecystectomy  1958  . Cardiac valve replacement  May 2011    aortic valve  . Carpal tunnel release  12/2010    left hand  . Cataract/glaucoma surgery  11/2010    [facility]  . Breast surgery  1985    [facility]  . Abdominal hysterectomy      for fibroids  . Mastectomy      was only for removal of cysts, benign  . Eye surgery  01/2011    for glaucoma  . Carpal tunnel release  08/29/2011    Procedure: CARPAL TUNNEL RELEASE;  Surgeon: Cammie Sickle., MD;  Location: [facility];  Service: Orthopedics;  Laterality: Right;   Family History  Problem Relation Age of Onset  . Deep vein thrombosis Mother   . Heart attack Father   . Stroke Father   . Osteoporosis      aunt   Social History  Substance  Use Topics  . Smoking status: Former Smoker -- 0.50 packs/day    Types: Cigarettes    Quit date: 08/18/1978  . Smokeless tobacco: None  . Alcohol Use: No   OB History    No data available     Review of Systems  Constitutional: Positive for fever.  HENT: Negative for sore throat.   Respiratory: Positive for cough. Negative for shortness of breath.   Cardiovascular: Negative for chest pain.  Gastrointestinal: Positive for nausea and vomiting. Negative for abdominal pain, diarrhea and constipation.  Genitourinary: Negative for dysuria.  Musculoskeletal: Negative for myalgias.  All other systems reviewed and are negative.   Allergies  Tramadol  Home Medications   Prior to Admission  medications   Medication Sig Start Date End Date Taking? Authorizing Provider  ALPRAZolam (XANAX) 0.25 MG tablet Take 1 tablet (0.25 mg total) by mouth at bedtime as needed for anxiety. 08/06/15  Yes Ladell Pier, MD  aspirin 81 MG tablet Take 81 mg by mouth daily.     Yes Historical Provider, MD  Calcium Citrate-Vitamin D 250-200 MG-UNIT TABS Take 2 tablets by mouth daily.    Yes Historical Provider, MD  Diphenhydramine-APAP, sleep, (TYLENOL PM EXTRA STRENGTH PO) Take 1 tablet by mouth at bedtime as needed (pain/sleep).    Yes Historical Provider, MD  dorzolamide-timolol (COSOPT) 22.3-6.8 MG/ML ophthalmic solution Place 1 drop into the left eye 2 (two) times daily.    Yes Historical Provider, MD  furosemide (LASIX) 20 MG tablet Take 1 tablet (20 mg total) by mouth daily. 05/15/15  Yes Larey Dresser, MD  Multiple Vitamins-Minerals (CENTRUM SILVER ULTRA WOMENS PO) Take 1 tablet by mouth daily.     Yes Historical Provider, MD  Omega-3 Fatty Acids (FISH OIL) 1000 MG CAPS Take 1 capsule by mouth daily. Reported on 08/06/2015   Yes Historical Provider, MD  pazopanib (VOTRIENT) 200 MG tablet Take 4 tablets (800 mg total) by mouth daily. Take on an empty stomach. 08/14/15  Yes Ladell Pier, MD  PREDNISONE, PAK, PO Take 2.5 mg by mouth every other day.    Yes Historical Provider, MD  ezetimibe (ZETIA) 10 MG tablet Take 1 tablet (10 mg total) by mouth daily. 05/12/14   Jolaine Artist, MD  potassium chloride (K-DUR) 10 MEQ tablet Take 1 tablet (10 mEq total) by mouth daily. Patient not taking: Reported on 08/14/2015 05/15/15   Larey Dresser, MD   BP 132/88 mmHg  Pulse 87  Temp(Src) 98.5 F (36.9 C) (Oral)  Resp 18  SpO2 96% Physical Exam  Constitutional: She is oriented to person, place, and time. She appears well-developed and well-nourished. No distress.  HENT:  Head: Normocephalic and atraumatic.  Mouth/Throat: Oropharynx is clear and moist and mucous membranes are normal. No  oropharyngeal exudate.  No thrush  Eyes: EOM are normal. Pupils are equal, round, and reactive to light.  Neck: Trachea normal and normal range of motion. Neck supple. Carotid bruit is not present. No tracheal deviation present.  Cardiovascular: Normal rate, regular rhythm and normal heart sounds.  Exam reveals no gallop and no friction rub.   No murmur heard. Pulmonary/Chest: Effort normal and breath sounds normal. No stridor. No respiratory distress. She has no wheezes. She has no rales.  Abdominal: Soft. There is no tenderness. There is no rebound and no guarding.  Tinkling bowel sounds  Musculoskeletal: Normal range of motion. She exhibits no edema.  Lymphadenopathy:    She has no cervical adenopathy.  Neurological:  She is alert and oriented to person, place, and time. She has normal reflexes.  Skin: Skin is warm and dry.  Psychiatric: She has a normal mood and affect. Her behavior is normal.  Nursing note and vitals reviewed.   ED Course  Procedures (including critical care time) DIAGNOSTIC STUDIES: Oxygen Saturation is 96% on RA, nl by my interpretation.    Patient and family updated on 7 separate occasions regarding labs and imaging and plan of care   COORDINATION OF CARE: 11:39 PM Discussed treatment plan with pt at bedside which includes labs, EKG, UA, CT abdomen pelvis with contrast and pt agreed to plan.  2:17 AM- Discussed pt care with General Surgeon, Dr. Redmond Pulling who informed that there is nothing to be completed from a surgical standpoint. Dr. Redmond Pulling recommends that if there is an inflammatory infection noted on the CT then administer IV antibiotics and admission to medications. No surgical intervention at this time.   2:41 AM- Discussed pt care with Oncologist, Dr. Marin Olp who recommends that the pt receive IV antibiotics and admission to the hospital for further evaluation.  3:01 AM- Case discussed with Pharmacist, Ben at [facility] who by his report already spoke with Dr.  [name] of Cardiologist regarding the pt and the antiemetic choice. Phenergan has far less prolongation of the QTC than zofran. Family refusing phenergan at this time.    Labs Review Labs Reviewed  COMPREHENSIVE METABOLIC PANEL - Abnormal; Notable for the following:    Sodium 130 (*)    Chloride 91 (*)    Glucose, Bld 107 (*)    BUN 24 (*)    Creatinine, Ser 1.19 (*)    AST 59 (*)    ALT 59 (*)    Alkaline Phosphatase 181 (*)    GFR calc non Af Amer 41 (*)    GFR calc Af Amer 47 (*)    All other components within normal limits  CBC - Abnormal; Notable for the following:    WBC 14.4 (*)    All other components within normal limits  URINALYSIS, ROUTINE W REFLEX MICROSCOPIC (NOT AT [facility]) - Abnormal; Notable for the following:    APPearance TURBID (*)    Hgb urine dipstick LARGE (*)    Protein, ur 100 (*)    All other components within normal limits  URINE MICROSCOPIC-ADD ON - Abnormal; Notable for the following:    Squamous Epithelial / LPF 0-5 (*)    Bacteria, UA FEW (*)    All other components within normal limits  CBC WITH DIFFERENTIAL/PLATELET - Abnormal; Notable for the following:    WBC 10.7 (*)    Neutro Abs 8.7 (*)    All other components within normal limits  CULTURE, BLOOD (ROUTINE X 2)  CULTURE, BLOOD (ROUTINE X 2)  LIPASE, BLOOD  URINALYSIS, ROUTINE W REFLEX MICROSCOPIC (NOT AT [facility])  I-STAT TROPOININ, ED    Imaging Review Ct Abdomen Pelvis W Contrast  08/20/2015  CLINICAL DATA:  Acute onset of nausea, vomiting and fever. Recent liver biopsy on August 10, 2015. Initial encounter. EXAM: CT ABDOMEN AND PELVIS WITH CONTRAST TECHNIQUE: Multidetector CT imaging of the abdomen and pelvis was performed using the standard protocol following bolus administration of intravenous contrast. CONTRAST:  27mL OMNIPAQUE IOHEXOL 300 MG/ML  SOLN COMPARISON:  CT of the chest, abdomen and pelvis performed 08/08/2015, and MRI of the abdomen performed 08/03/2015 FINDINGS: A trace  right-sided pleural fluid is noted, new from the prior study, possibly reactive in nature. Scattered coronary  artery calcifications are noted. The known multiple metastatic lesions within the liver are better characterized on prior CT; associated perfusion anomalies are not well assessed given the phase of contrast enhancement. These appear to be relatively stable in size. A large mass is again noted occupying much of the right kidney, with mild sparing of the lower pole of the right kidney. The degree of infiltration of the retroperitoneum anterior to the lumbar spine appears mildly worsened, and the mass thus measures approximately 10.5 x 6.4 x 9.2 cm. Per correlation with recent biopsy results, this most likely reflects urothelial carcinoma. Surrounding soft tissue inflammation and trace fluid is seen. There is new vague fluid attenuation tracking adjacent to the antrum of the stomach and hepatic flexure of the colon, measuring approximately 7.0 x 5.7 x 5.2 cm, which still contains several foci of fat. Given the patient's leukocytosis, mild apparent peripheral enhancement, and the relatively low attenuation of this collection, this is concerning for evolving abscess infiltrating the colonic mesentery. It is likely not sufficiently organized to be fully drainable at this time. There is adjacent wall thickening involving the hepatic flexure of the colon, and surrounding soft tissue inflammation is noted about the omentum. The patient is status post cholecystectomy. Prominence of the common bile duct and intrahepatic biliary ducts is relatively stable from the prior study. The spleen is unremarkable in appearance. The pancreas and adrenal glands are grossly unremarkable, though mass infiltrates about portions of the right adrenal gland. The left kidney is unremarkable in appearance. On the left side, there is no evidence of hydronephrosis. No renal or ureteral stones are seen. The small bowel is grossly  unremarkable. The duodenum is decompressed and passes near the collection of fluid. The stomach is largely distended with contrast and air, and is grossly unremarkable. No acute vascular abnormalities are seen. There is scattered calcification along the abdominal aorta and its branches. The patient is status post appendectomy. Scattered diverticulosis is noted along the sigmoid colon, without evidence of diverticulitis. There is unusual prominence of varices within the retroperitoneum and pelvis. The bladder is mildly distended and grossly unremarkable. The patient is status post hysterectomy. No suspicious adnexal masses are seen. No inguinal lymphadenopathy is seen. No acute osseous abnormalities are identified. Multilevel vacuum phenomenon is noted along the lumbar spine, with mild grade 1 anterolisthesis of L4 on L5, reflecting underlying facet disease. IMPRESSION: 1. New vague fluid collection tracking adjacent to the antrum of the stomach and hepatic flexure of the colon, measuring 7.0 x 5.7 x 5.2 cm, which still contains several foci of fat. Given the patient's leukocytosis, mild apparent peripheral enhancement, and the relatively low attenuation of this collection, this is concerning for evolving abscess infiltrating the colonic mesentery. It is likely not sufficiently organized to be fully drainable at this time. 2. Adjacent associated wall thickening involving the hepatic flexure of the colon, and surrounding soft tissue inflammation about the omentum. 3. Trace right-sided pleural fluid, new from the prior study, possibly reactive in nature. 4. Large mass again noted occupying much of the right kidney, with mild sparing of the lower pole of the right kidney. There appears to be mildly worsened infiltration of the retroperitoneum anterior to the lumbar spine, and partial infiltration about the right adrenal gland; the mass thus measures approximately 10.5 x 6.4 x 9.2 cm. Per correlation with recent biopsy  results, this most likely reflects urothelial carcinoma. 5. Known multiple metastatic lesions within the liver are better characterized on prior CT, with associated perfusion  anomalies, not well assessed on this study due to the phase of contrast enhancement. Metastatic lesions appear relatively stable from the recent prior CT. 6. Scattered coronary artery calcifications seen. 7. Scattered calcification along the abdominal aorta and its branches. 8. Scattered diverticulosis along the sigmoid colon, without evidence of diverticulitis. 9. Unusual prominence of varices within the retroperitoneum and pelvis. 10. Mild degenerative change noted along the lumbar spine. These results were called by telephone at the time of interpretation on 08/20/2015 at 2:32 am to Dr. Veatrice Kells, who verbally acknowledged these results. Electronically Signed   By: Garald Balding M.D.   On: 08/20/2015 02:34   I have personally reviewed and evaluated these images and lab results as part of my medical decision-making.   EKG Interpretation   Date/Time:  Sunday August 19 2015 23:27:01 EST Ventricular Rate:  82 PR Interval:  188 QRS Duration: 94 QT Interval:  352 QTC Calculation: 411 R Axis:   -75 Text Interpretation:  Sinus rhythm Left anterior fascicular block  Borderline T abnormalities, anterior leads Confirmed by [facility]  MD,  Matina Rodier ([id number]) on 08/19/2015 11:45:56 PM      MDM   Final diagnoses:  None    Medications  promethazine (PHENERGAN) injection 12.5 mg (not administered)  ondansetron (ZOFRAN) injection 4 mg (4 mg Intravenous Not Given 08/20/15 0507)  iohexol (OMNIPAQUE) 300 MG/ML solution 25 mL (not administered)  sodium chloride 0.9 % bolus 1,000 mL (1,000 mLs Intravenous Not Given 08/20/15 0609)  0.9 %  sodium chloride infusion ( Intravenous Rate/Dose Change 08/20/15 0611)  acetaminophen (TYLENOL) tablet 650 mg (not administered)    Or  acetaminophen (TYLENOL) suppository 650 mg (not administered)   HYDROmorphone (DILAUDID) injection 0.5-1 mg (not administered)  ondansetron (ZOFRAN) tablet 4 mg (not administered)    Or  ondansetron (ZOFRAN) injection 4 mg (not administered)  sodium chloride 0.9 % injection 3 mL (not administered)  hydrALAZINE (APRESOLINE) injection 2 mg (not administered)  pantoprazole (PROTONIX) injection 40 mg (40 mg Intravenous Given 08/20/15 0516)  piperacillin-tazobactam (ZOSYN) IVPB 3.375 g (not administered)  vancomycin (VANCOCIN) IVPB 1000 mg/200 mL premix (not administered)  antiseptic oral rinse (CPC / CETYLPYRIDINIUM CHLORIDE 0.05%) solution 7 mL (not administered)  sodium chloride 0.9 % bolus 1,000 mL (0 mLs Intravenous Stopped 08/20/15 0220)  ondansetron (ZOFRAN) injection 4 mg (4 mg Intravenous Given 08/19/15 2350)  sodium chloride 0.9 % bolus 1,000 mL (1,000 mLs Intravenous Transfusing/Transfer 08/20/15 0359)  iohexol (OMNIPAQUE) 300 MG/ML solution 75 mL (75 mLs Intravenous Contrast Given 08/20/15 0150)  vancomycin (VANCOCIN) IVPB 1000 mg/200 mL premix (1,000 mg Intravenous Given 08/20/15 0516)  piperacillin-tazobactam (ZOSYN) IVPB 3.375 g (3.375 g Intravenous Transfusing/Transfer 08/20/15 0400)  LORazepam (ATIVAN) injection 0.5 mg (0.5 mg Intravenous Given 08/20/15 0516)   Results for orders placed or performed during the hospital encounter of 08/19/15  Lipase, blood  Result Value Ref Range   Lipase 50 11 - 51 U/L  Comprehensive metabolic panel  Result Value Ref Range   Sodium 130 (L) 135 - 145 mmol/L   Potassium 4.1 3.5 - 5.1 mmol/L   Chloride 91 (L) 101 - 111 mmol/L   CO2 27 22 - 32 mmol/L   Glucose, Bld 107 (H) 65 - 99 mg/dL   BUN 24 (H) 6 - 20 mg/dL   Creatinine, Ser 1.19 (H) 0.44 - 1.00 mg/dL   Calcium 9.6 8.9 - 10.3 mg/dL   Total Protein 7.8 6.5 - 8.1 g/dL   Albumin 3.5 3.5 - 5.0 g/dL  AST 59 (H) 15 - 41 U/L   ALT 59 (H) 14 - 54 U/L   Alkaline Phosphatase 181 (H) 38 - 126 U/L   Total Bilirubin 0.9 0.3 - 1.2 mg/dL   GFR calc non Af Amer 41 (L) >60  mL/min   GFR calc Af Amer 47 (L) >60 mL/min   Anion gap 12 5 - 15  CBC  Result Value Ref Range   WBC 14.4 (H) 4.0 - 10.5 K/uL   RBC 4.12 3.87 - 5.11 MIL/uL   Hemoglobin 12.4 12.0 - 15.0 g/dL   HCT 36.3 36.0 - 46.0 %   MCV 88.1 78.0 - 100.0 fL   MCH 30.1 26.0 - 34.0 pg   MCHC 34.2 30.0 - 36.0 g/dL   RDW 13.3 11.5 - 15.5 %   Platelets 342 150 - 400 K/uL  Urinalysis, Routine w reflex microscopic (not at [facility])  Result Value Ref Range   Color, Urine YELLOW YELLOW   APPearance TURBID (A) CLEAR   Specific Gravity, Urine 1.018 1.005 - 1.030   pH 8.0 5.0 - 8.0   Glucose, UA NEGATIVE NEGATIVE mg/dL   Hgb urine dipstick LARGE (A) NEGATIVE   Bilirubin Urine NEGATIVE NEGATIVE   Ketones, ur NEGATIVE NEGATIVE mg/dL   Protein, ur 100 (A) NEGATIVE mg/dL   Nitrite NEGATIVE NEGATIVE   Leukocytes, UA NEGATIVE NEGATIVE  Urine microscopic-add on  Result Value Ref Range   Squamous Epithelial / LPF 0-5 (A) NONE SEEN   WBC, UA 0-5 0 - 5 WBC/hpf   RBC / HPF 6-30 0 - 5 RBC/hpf   Bacteria, UA FEW (A) NONE SEEN  CBC with Differential/Platelet  Result Value Ref Range   WBC 10.7 (H) 4.0 - 10.5 K/uL   RBC 4.42 3.87 - 5.11 MIL/uL   Hemoglobin 13.5 12.0 - 15.0 g/dL   HCT 38.8 36.0 - 46.0 %   MCV 87.8 78.0 - 100.0 fL   MCH 30.5 26.0 - 34.0 pg   MCHC 34.8 30.0 - 36.0 g/dL   RDW 13.2 11.5 - 15.5 %   Platelets 302 150 - 400 K/uL   Neutrophils Relative % 82 %   Neutro Abs 8.7 (H) 1.7 - 7.7 K/uL   Lymphocytes Relative 12 %   Lymphs Abs 1.3 0.7 - 4.0 K/uL   Monocytes Relative 6 %   Monocytes Absolute 0.7 0.1 - 1.0 K/uL   Eosinophils Relative 0 %   Eosinophils Absolute 0.0 0.0 - 0.7 K/uL   Basophils Relative 0 %   Basophils Absolute 0.0 0.0 - 0.1 K/uL  Basic metabolic panel  Result Value Ref Range   Sodium 130 (L) 135 - 145 mmol/L   Potassium 3.6 3.5 - 5.1 mmol/L   Chloride 95 (L) 101 - 111 mmol/L   CO2 25 22 - 32 mmol/L   Glucose, Bld 95 65 - 99 mg/dL   BUN 21 (H) 6 - 20 mg/dL   Creatinine, Ser  1.02 (H) 0.44 - 1.00 mg/dL   Calcium 8.6 (L) 8.9 - 10.3 mg/dL   GFR calc non Af Amer 49 (L) >60 mL/min   GFR calc Af Amer 57 (L) >60 mL/min   Anion gap 10 5 - 15  CBC  Result Value Ref Range   WBC 14.3 (H) 4.0 - 10.5 K/uL   RBC 4.03 3.87 - 5.11 MIL/uL   Hemoglobin 12.1 12.0 - 15.0 g/dL   HCT 34.9 (L) 36.0 - 46.0 %   MCV 86.6 78.0 - 100.0 fL  MCH 30.0 26.0 - 34.0 pg   MCHC 34.7 30.0 - 36.0 g/dL   RDW 13.2 11.5 - 15.5 %   Platelets 257 150 - 400 K/uL  Lactic acid, plasma  Result Value Ref Range   Lactic Acid, Venous 0.9 0.5 - 2.0 mmol/L  Procalcitonin  Result Value Ref Range   Procalcitonin 0.75 ng/mL  Protime-INR  Result Value Ref Range   Prothrombin Time 15.7 (H) 11.6 - 15.2 seconds   INR 1.24 0.00 - 1.49  APTT  Result Value Ref Range   aPTT 38 (H) 24 - 37 seconds  I-stat troponin, ED  Result Value Ref Range   Troponin i, poc 0.01 0.00 - 0.08 ng/mL   Comment 3           Ct Abdomen Pelvis W Wo Contrast  08/08/2015  CLINICAL DATA:  Right-sided abdominal pain, abdominal discomfort, weight loss. Right renal mass with liver lesions on MR/ultrasound. EXAM: CT CHEST, ABDOMEN, AND PELVIS WITH CONTRAST TECHNIQUE: Multidetector CT imaging of the chest, abdomen and pelvis was performed following the standard protocol during bolus administration of intravenous contrast. CONTRAST:  121mL OMNIPAQUE IOHEXOL 300 MG/ML  SOLN COMPARISON:  None. FINDINGS: CT CHEST FINDINGS Mediastinum/Nodes: The heart is normal in size. No pericardial effusion. Coronary atherosclerosis. Prosthetic aortic valve. Ectasia of the ascending thoracic aorta, measuring 3.8 cm. Atherosclerotic calcifications of the aortic arch. Scattered small mediastinal lymph nodes, including an 8 mm short axis high right paratracheal node (series 6/ image 19) and a 9 mm short axis subcarinal node (series 6/ image 39). Visualized thyroid is unremarkable. Lungs/Pleura: Lungs are essentially clear. Biapical pleural parenchymal scarring.  No  focal consolidation. No suspicious pulmonary nodules. No pleural effusion or pneumothorax. Musculoskeletal: Degenerative changes of the thoracic spine. Thoracic dextroscoliosis. CT ABDOMEN PELVIS FINDINGS Hepatobiliary: 11 mm hypoenhancing lesion in the posterior right hepatic dome (series 6/ image 81), suspicious for metastasis. Associated arterial perfusion anomaly (series 4/image 10). Two suspected hypoenhancing lesions along the posterior aspect of segment 6, measuring 1.7 x 1.9 cm (series 6/image 110) and 1.5 x 1.6 cm (series 6/image 115), suspicious for metastases. Associated arterial perfusion anomaly predominantly involving segment 6 of the liver (series 4/image 22). Status post cholecystectomy. Mild intrahepatic and extrahepatic ductal dilatation, likely secondary to extrinsic compression from the right renal mass. Pancreas: Within normal limits. Spleen: Within normal limits. Adrenals/Urinary Tract: Left adrenal gland is within normal limits. Right adrenal gland is poorly visualized but likely within normal limits. Left kidney is notable for a 7 mm cyst in the medial left upper pole (series 6/ image 96). Additional 15 mm medial left upper pole renal sinus cyst. No hydronephrosis. 6.3 x 7.7 x 9.7 cm infiltrating hypoenhancing mass replacing the right upper kidney. Mass extends into the right renal vein (series 6/image 95). Additional local extension of tumor into the anterior pararenal space and adjacent to the posterior liver margin (series 6/image 101). Tumor also abuts but does not definitely involve the psoas muscle (series 6/image 104). 2.1 x 1.1 cm fluid collection along the hepatorenal fossa (series 6/ image 100). Stomach/Bowel: Stomach is moderately distended. No evidence of bowel obstruction. Vascular/Lymphatic: Atherosclerotic calcifications of the abdominal aorta and branch vessels. No suspicious abdominopelvic lymphadenopathy. Reproductive: Status post hysterectomy. Left ovary is within normal  limits.  No right adnexal mass. Other: No abdominopelvic ascites. Musculoskeletal: Degenerative changes of the lumbar spine. Probable bone island at L5. Lumbar levoscoliosis. IMPRESSION: 9.7 cm infiltrating mass involving the right upper kidney. Extension into the right renal  vein. Local extension of tumor into the anterior perirenal space. Three hepatic metastases in segments 6 and 7, measuring up to 1.9 cm. No evidence of metastatic disease in the chest. Additional ancillary findings as above. Electronically Signed   By: Julian Hy M.D.   On: 08/08/2015 14:54   Ct Chest W Contrast  08/08/2015  CLINICAL DATA:  Right-sided abdominal pain, abdominal discomfort, weight loss. Right renal mass with liver lesions on MR/ultrasound. EXAM: CT CHEST, ABDOMEN, AND PELVIS WITH CONTRAST TECHNIQUE: Multidetector CT imaging of the chest, abdomen and pelvis was performed following the standard protocol during bolus administration of intravenous contrast. CONTRAST:  180mL OMNIPAQUE IOHEXOL 300 MG/ML  SOLN COMPARISON:  None. FINDINGS: CT CHEST FINDINGS Mediastinum/Nodes: The heart is normal in size. No pericardial effusion. Coronary atherosclerosis. Prosthetic aortic valve. Ectasia of the ascending thoracic aorta, measuring 3.8 cm. Atherosclerotic calcifications of the aortic arch. Scattered small mediastinal lymph nodes, including an 8 mm short axis high right paratracheal node (series 6/ image 19) and a 9 mm short axis subcarinal node (series 6/ image 39). Visualized thyroid is unremarkable. Lungs/Pleura: Lungs are essentially clear. Biapical pleural parenchymal scarring.  No focal consolidation. No suspicious pulmonary nodules. No pleural effusion or pneumothorax. Musculoskeletal: Degenerative changes of the thoracic spine. Thoracic dextroscoliosis. CT ABDOMEN PELVIS FINDINGS Hepatobiliary: 11 mm hypoenhancing lesion in the posterior right hepatic dome (series 6/ image 81), suspicious for metastasis. Associated  arterial perfusion anomaly (series 4/image 10). Two suspected hypoenhancing lesions along the posterior aspect of segment 6, measuring 1.7 x 1.9 cm (series 6/image 110) and 1.5 x 1.6 cm (series 6/image 115), suspicious for metastases. Associated arterial perfusion anomaly predominantly involving segment 6 of the liver (series 4/image 22). Status post cholecystectomy. Mild intrahepatic and extrahepatic ductal dilatation, likely secondary to extrinsic compression from the right renal mass. Pancreas: Within normal limits. Spleen: Within normal limits. Adrenals/Urinary Tract: Left adrenal gland is within normal limits. Right adrenal gland is poorly visualized but likely within normal limits. Left kidney is notable for a 7 mm cyst in the medial left upper pole (series 6/ image 96). Additional 15 mm medial left upper pole renal sinus cyst. No hydronephrosis. 6.3 x 7.7 x 9.7 cm infiltrating hypoenhancing mass replacing the right upper kidney. Mass extends into the right renal vein (series 6/image 95). Additional local extension of tumor into the anterior pararenal space and adjacent to the posterior liver margin (series 6/image 101). Tumor also abuts but does not definitely involve the psoas muscle (series 6/image 104). 2.1 x 1.1 cm fluid collection along the hepatorenal fossa (series 6/ image 100). Stomach/Bowel: Stomach is moderately distended. No evidence of bowel obstruction. Vascular/Lymphatic: Atherosclerotic calcifications of the abdominal aorta and branch vessels. No suspicious abdominopelvic lymphadenopathy. Reproductive: Status post hysterectomy. Left ovary is within normal limits.  No right adnexal mass. Other: No abdominopelvic ascites. Musculoskeletal: Degenerative changes of the lumbar spine. Probable bone island at L5. Lumbar levoscoliosis. IMPRESSION: 9.7 cm infiltrating mass involving the right upper kidney. Extension into the right renal vein. Local extension of tumor into the anterior perirenal space.  Three hepatic metastases in segments 6 and 7, measuring up to 1.9 cm. No evidence of metastatic disease in the chest. Additional ancillary findings as above. Electronically Signed   By: Julian Hy M.D.   On: 08/08/2015 14:54   Mr Abdomen W Wo Contrast  08/03/2015  CLINICAL DATA:  Ultrasound demonstrating lack of right-sided renal sinus fat. Possible mass. Right upper quadrant pain. EXAM: MRI ABDOMEN WITHOUT AND WITH CONTRAST  TECHNIQUE: Multiplanar multisequence MR imaging of the abdomen was performed both before and after the administration of intravenous contrast. CONTRAST:  54mL MULTIHANCE GADOBENATE DIMEGLUMINE 529 MG/ML IV SOLN COMPARISON:  Ultrasound of 07/30/2015.  CT of 01/07/2007. FINDINGS: Mild motion degradation throughout. Lower chest: Cardiomegaly. Median sternotomy. Small right pleural effusion. Hepatobiliary: Multiple right-sided liver lesions. Subcapsular posterior right hepatic lobe 1.3 cm is T2 hyperintense and hypo enhancing, including on image 28/series 4. A subcapsular anterior segment right liver lobe 1.5 cm lesion is identified on image 44/series 903. Cholecystectomy. The intrahepatic ducts are mildly prominent. The common duct measures up to 1.5 cm in the porta hepatis on image 12/ series 7. Tapers to 8 mm in thepancreatic head. Suspect mass-effect secondary to the right-sided retroperitoneal process. Pancreas: Normal, without mass or ductal dilatation. Spleen: Normal in size, without focal abnormality. Adrenals/Urinary Tract: Normal left adrenal gland. Right adrenal not well evaluated. A too small to characterize left renal lesion. Markedly abnormal appearance of the right kidney. Mildly enlarged, with dilated renal collecting systems and renal pelvis. Possible renal pelvic stone on image 24/series 7. Areas of precontrast T1 hyperintensity within are suspicious for hemorrhage. Example series 900. Markedly diminished post-contrast enhancement throughout the right kidney. Surrounding  perinephric edema and cyst early post-contrast hyperemia in the adjacent right hepatic lobe. Example image 36/ series 901. Stomach/Bowel: Mild gastric distension. Normal caliber of abdominal bowel loops. Vascular/Lymphatic: Normal caliber of the aorta and branch vessels. Suspect extension of the right renal process (likely tumor) into the IVC, including on image 14 of series 4. The more inferior IVC is also hypo enhancing on image 44/ series 904. Limited evaluation for retroperitoneal adenopathy. Soft tissue fullness in the aortocaval space is suspicious on image 41/ series 904. Other: No abdominal ascites. Musculoskeletal: S-shaped thoracolumbar spine curvature. IMPRESSION: 1. Markedly abnormal appearance of the right kidney. Enlarged with marked hypo enhancement. Extension of this process medially into the IVC with probable adjacent retroperitoneal adenopathy. Findings are suspicious for infiltrative renal neoplasm with venous extension. Given concurrent caliectasis and possible obstructing renal pelvic stone, an infectious process such as xanthogranulomatous pyelonephritis is possible but felt less likely. Given motion on current exam, consider further evaluation with hematuria protocol abdominal pelvic pre and post contrast CT. These results will be called to the ordering clinician or representative by the Radiologist Assistant, and communication documented in the PACS or zVision Dashboard. 2. Right-sided liver lesions which are suspicious for metastatic disease, presuming the renal process is neoplasm. Extension of infection felt less likely. 3. Small right pleural effusion. 4. Cholecystectomy with biliary duct dilatation. Question mass effect by the right-sided retroperitoneal process. Recommend attention on follow-up. Electronically Signed   By: Abigail Miyamoto M.D.   On: 08/03/2015 13:43   US Abdomen Complete  07/30/2015  CLINICAL DATA:  Right upper quadrant pain EXAM: ULTRASOUND ABDOMEN COMPLETE  COMPARISON:  None. FINDINGS: Gallbladder: Prior cholecystectomy Common bile duct: Diameter: Normal caliber a for post cholecystectomy state as 7 mm, tapering to 4 mm distally. Liver: Small echogenic focus in the right hepatic lobe measures 6 mm, likely small hemangioma. Small right hepatic cysts, the largest 2.5 cm. IVC: No abnormality visualized. Pancreas: Visualized portion unremarkable. Spleen: Size and appearance within normal limits. Right Kidney: Length: 10.0 cm. Heterogeneous, echogenic appearance throughout the entire right kidney with loss of renal sinus. Cannot exclude infiltrating mass. Left Kidney: Length: 12.1 cm. Echogenicity within normal limits. No mass or hydronephrosis visualized. Abdominal aorta: No aneurysm visualized. Other findings: None. IMPRESSION: Echogenic, heterogeneous appearance  of the right kidney with loss of the renal sinus. Cannot exclude infiltrating mass. Consider further evaluation with CT or MRI with and without contrast. Prior cholecystectomy. Electronically Signed   By: Rolm Baptise M.D.   On: 07/30/2015 10:34   Ct Abdomen Pelvis W Contrast  08/20/2015  CLINICAL DATA:  Acute onset of nausea, vomiting and fever. Recent liver biopsy on August 10, 2015. Initial encounter. EXAM: CT ABDOMEN AND PELVIS WITH CONTRAST TECHNIQUE: Multidetector CT imaging of the abdomen and pelvis was performed using the standard protocol following bolus administration of intravenous contrast. CONTRAST:  64mL OMNIPAQUE IOHEXOL 300 MG/ML  SOLN COMPARISON:  CT of the chest, abdomen and pelvis performed 08/08/2015, and MRI of the abdomen performed 08/03/2015 FINDINGS: A trace right-sided pleural fluid is noted, new from the prior study, possibly reactive in nature. Scattered coronary artery calcifications are noted. The known multiple metastatic lesions within the liver are better characterized on prior CT; associated perfusion anomalies are not well assessed given the phase of contrast enhancement.  These appear to be relatively stable in size. A large mass is again noted occupying much of the right kidney, with mild sparing of the lower pole of the right kidney. The degree of infiltration of the retroperitoneum anterior to the lumbar spine appears mildly worsened, and the mass thus measures approximately 10.5 x 6.4 x 9.2 cm. Per correlation with recent biopsy results, this most likely reflects urothelial carcinoma. Surrounding soft tissue inflammation and trace fluid is seen. There is new vague fluid attenuation tracking adjacent to the antrum of the stomach and hepatic flexure of the colon, measuring approximately 7.0 x 5.7 x 5.2 cm, which still contains several foci of fat. Given the patient's leukocytosis, mild apparent peripheral enhancement, and the relatively low attenuation of this collection, this is concerning for evolving abscess infiltrating the colonic mesentery. It is likely not sufficiently organized to be fully drainable at this time. There is adjacent wall thickening involving the hepatic flexure of the colon, and surrounding soft tissue inflammation is noted about the omentum. The patient is status post cholecystectomy. Prominence of the common bile duct and intrahepatic biliary ducts is relatively stable from the prior study. The spleen is unremarkable in appearance. The pancreas and adrenal glands are grossly unremarkable, though mass infiltrates about portions of the right adrenal gland. The left kidney is unremarkable in appearance. On the left side, there is no evidence of hydronephrosis. No renal or ureteral stones are seen. The small bowel is grossly unremarkable. The duodenum is decompressed and passes near the collection of fluid. The stomach is largely distended with contrast and air, and is grossly unremarkable. No acute vascular abnormalities are seen. There is scattered calcification along the abdominal aorta and its branches. The patient is status post appendectomy. Scattered  diverticulosis is noted along the sigmoid colon, without evidence of diverticulitis. There is unusual prominence of varices within the retroperitoneum and pelvis. The bladder is mildly distended and grossly unremarkable. The patient is status post hysterectomy. No suspicious adnexal masses are seen. No inguinal lymphadenopathy is seen. No acute osseous abnormalities are identified. Multilevel vacuum phenomenon is noted along the lumbar spine, with mild grade 1 anterolisthesis of L4 on L5, reflecting underlying facet disease. IMPRESSION: 1. New vague fluid collection tracking adjacent to the antrum of the stomach and hepatic flexure of the colon, measuring 7.0 x 5.7 x 5.2 cm, which still contains several foci of fat. Given the patient's leukocytosis, mild apparent peripheral enhancement, and the relatively low attenuation of  this collection, this is concerning for evolving abscess infiltrating the colonic mesentery. It is likely not sufficiently organized to be fully drainable at this time. 2. Adjacent associated wall thickening involving the hepatic flexure of the colon, and surrounding soft tissue inflammation about the omentum. 3. Trace right-sided pleural fluid, new from the prior study, possibly reactive in nature. 4. Large mass again noted occupying much of the right kidney, with mild sparing of the lower pole of the right kidney. There appears to be mildly worsened infiltration of the retroperitoneum anterior to the lumbar spine, and partial infiltration about the right adrenal gland; the mass thus measures approximately 10.5 x 6.4 x 9.2 cm. Per correlation with recent biopsy results, this most likely reflects urothelial carcinoma. 5. Known multiple metastatic lesions within the liver are better characterized on prior CT, with associated perfusion anomalies, not well assessed on this study due to the phase of contrast enhancement. Metastatic lesions appear relatively stable from the recent prior CT. 6.  Scattered coronary artery calcifications seen. 7. Scattered calcification along the abdominal aorta and its branches. 8. Scattered diverticulosis along the sigmoid colon, without evidence of diverticulitis. 9. Unusual prominence of varices within the retroperitoneum and pelvis. 10. Mild degenerative change noted along the lumbar spine. These results were called by telephone at the time of interpretation on 08/20/2015 at 2:32 am to Dr. Veatrice Kells, who verbally acknowledged these results. Electronically Signed   By: Garald Balding M.D.   On: 08/20/2015 02:34   US Biopsy  08/10/2015  INDICATION: Imaging findings compatible with advanced right-sided renal cell carcinoma, now with multiple ill-defined liver lesions worrisome for metastatic disease. Please perform ultrasound-guided liver lesion biopsy for tissue diagnostic purposes. EXAM: ULTRASOUND GUIDED LIVER LESION BIOPSY COMPARISON:  CT of the chest, abdomen and pelvis - 07/29/2015; abdominal MRI -08/03/2015 MEDICATIONS: Fentanyl 25 mcg IV; Versed 0 point for mg IV ANESTHESIA/SEDATION: Total Moderate Sedation time 15 minutes COMPLICATIONS: None immediate PROCEDURE: Informed written consent was obtained from the patient after a discussion of the risks, benefits and alternatives to treatment. The patient understands and consents the procedure. A timeout was performed prior to the initiation of the procedure. Initial ultrasound scanning of the liver failed to definitively identify any of the ill-defined lesions seen on preceding abdominal CT. Ultimately an approximately 1.6 cm ill-defined hypo attenuating lesion within the caudal aspect of the right lobe of the liver (image 4) correlating with the dominant lesion seen on preceding abdominal CT image 115, series 6, was found sonographically and the procedure was planned. The right upper abdominal quadrant was prepped and draped in the usual sterile fashion. The overlying soft tissues were anesthetized with 1%  lidocaine with epinephrine. A 17 gauge, 6.8 cm co-axial needle was advanced into a peripheral aspect of the lesion. This was followed by 6 core biopsies with an 18 gauge core device under direct ultrasound guidance. The coaxial needle tract was embolized with a small amount of Gel-Foam slurry and superficial hemostasis was obtained with manual compression. Post procedural scanning was negative for definitive area of hemorrhage or additional complication. A dressing was placed. The patient tolerated the procedure well without immediate post procedural complication. IMPRESSION: Technically successful ultrasound guided core needle biopsy of the ill-defined hypoechoic approximately 1.6 cm lesion with the caudal aspect of the right lobe of the liver. Note, if this biopsy proves nondiagnostic, attempts at establishing a tissue diagnosis could be performed with a CT-guided liver lesion biopsy though I also feel CT guided biopsy would be difficult  given the stealth appearance of the hepatic lesions. Electronically Signed   By: Sandi Mariscal M.D.   On: 08/10/2015 12:24   Dg Chest Port 1 View  08/20/2015  CLINICAL DATA:  Acute onset of nausea.  Sepsis.  Initial encounter. EXAM: PORTABLE CHEST 1 VIEW COMPARISON:  Chest radiograph performed 01/22/2010, and CT of the chest performed 08/08/2015 FINDINGS: The lungs are well-aerated. Vascular congestion is noted. Mild bibasilar opacities may reflect mild interstitial edema or possibly pneumonia. There is no evidence of pleural effusion or pneumothorax. The cardiomediastinal silhouette is borderline normal in size. The patient is status post median sternotomy. A valve replacement is noted. No acute osseous abnormalities are seen. IMPRESSION: Vascular congestion noted. Mild bibasilar opacities may reflect mild interstitial edema or possibly pneumonia. Electronically Signed   By: Garald Balding M.D.   On: 08/20/2015 05:38    Will admit to medicine for IVF and IV antibiotics,  vancomycin and zosyn begun in the ED.  Seen by Dr. Arnoldo Morale of [facility]   I personally performed the services described in this documentation, which was scribed in my presence. The recorded information has been reviewed and is accurate.     Veatrice Kells, MD 08/20/15 [phone]

## 2015-08-19 NOTE — ED Notes (Signed)
Pt's O2 sats dropped to 86% on RA.  2L O2 via [place] applied w/ an improvement in sats to 91%.

## 2015-08-19 NOTE — ED Notes (Signed)
Pt started votrient 800 mg QD on 08/17/15.  Today pt has been vomiting and unable to keep anything down.  Pt is A&O x 4.  Denies abdominal pain.  +fever.

## 2015-08-20 ENCOUNTER — Emergency Department (HOSPITAL_COMMUNITY): Payer: Medicare Other

## 2015-08-20 ENCOUNTER — Inpatient Hospital Stay (HOSPITAL_COMMUNITY): Payer: Medicare Other

## 2015-08-20 ENCOUNTER — Encounter (HOSPITAL_COMMUNITY): Payer: Self-pay

## 2015-08-20 DIAGNOSIS — C787 Secondary malignant neoplasm of liver and intrahepatic bile duct: Secondary | ICD-10-CM | POA: Diagnosis present

## 2015-08-20 DIAGNOSIS — Z954 Presence of other heart-valve replacement: Secondary | ICD-10-CM

## 2015-08-20 DIAGNOSIS — R5081 Fever presenting with conditions classified elsewhere: Secondary | ICD-10-CM | POA: Diagnosis not present

## 2015-08-20 DIAGNOSIS — I5032 Chronic diastolic (congestive) heart failure: Secondary | ICD-10-CM | POA: Diagnosis present

## 2015-08-20 DIAGNOSIS — K838 Other specified diseases of biliary tract: Secondary | ICD-10-CM | POA: Diagnosis not present

## 2015-08-20 DIAGNOSIS — R509 Fever, unspecified: Secondary | ICD-10-CM | POA: Diagnosis not present

## 2015-08-20 DIAGNOSIS — R7989 Other specified abnormal findings of blood chemistry: Secondary | ICD-10-CM | POA: Diagnosis present

## 2015-08-20 DIAGNOSIS — Z87891 Personal history of nicotine dependence: Secondary | ICD-10-CM | POA: Diagnosis not present

## 2015-08-20 DIAGNOSIS — Z66 Do not resuscitate: Secondary | ICD-10-CM | POA: Diagnosis present

## 2015-08-20 DIAGNOSIS — E43 Unspecified severe protein-calorie malnutrition: Secondary | ICD-10-CM | POA: Diagnosis present

## 2015-08-20 DIAGNOSIS — N183 Chronic kidney disease, stage 3 (moderate): Secondary | ICD-10-CM | POA: Diagnosis present

## 2015-08-20 DIAGNOSIS — R110 Nausea: Secondary | ICD-10-CM | POA: Diagnosis not present

## 2015-08-20 DIAGNOSIS — K209 Esophagitis, unspecified: Secondary | ICD-10-CM | POA: Diagnosis present

## 2015-08-20 DIAGNOSIS — M353 Polymyalgia rheumatica: Secondary | ICD-10-CM

## 2015-08-20 DIAGNOSIS — R933 Abnormal findings on diagnostic imaging of other parts of digestive tract: Secondary | ICD-10-CM | POA: Diagnosis not present

## 2015-08-20 DIAGNOSIS — Z8249 Family history of ischemic heart disease and other diseases of the circulatory system: Secondary | ICD-10-CM | POA: Diagnosis not present

## 2015-08-20 DIAGNOSIS — Z79899 Other long term (current) drug therapy: Secondary | ICD-10-CM | POA: Diagnosis not present

## 2015-08-20 DIAGNOSIS — R609 Edema, unspecified: Secondary | ICD-10-CM | POA: Diagnosis not present

## 2015-08-20 DIAGNOSIS — R112 Nausea with vomiting, unspecified: Secondary | ICD-10-CM | POA: Diagnosis present

## 2015-08-20 DIAGNOSIS — E871 Hypo-osmolality and hyponatremia: Secondary | ICD-10-CM | POA: Diagnosis present

## 2015-08-20 DIAGNOSIS — R740 Nonspecific elevation of levels of transaminase and lactic acid dehydrogenase [LDH]: Secondary | ICD-10-CM | POA: Diagnosis present

## 2015-08-20 DIAGNOSIS — Y848 Other medical procedures as the cause of abnormal reaction of the patient, or of later complication, without mention of misadventure at the time of the procedure: Secondary | ICD-10-CM | POA: Diagnosis present

## 2015-08-20 DIAGNOSIS — E785 Hyperlipidemia, unspecified: Secondary | ICD-10-CM | POA: Diagnosis present

## 2015-08-20 DIAGNOSIS — R188 Other ascites: Secondary | ICD-10-CM | POA: Diagnosis present

## 2015-08-20 DIAGNOSIS — K831 Obstruction of bile duct: Secondary | ICD-10-CM | POA: Diagnosis present

## 2015-08-20 DIAGNOSIS — E860 Dehydration: Secondary | ICD-10-CM | POA: Diagnosis present

## 2015-08-20 DIAGNOSIS — Z90710 Acquired absence of both cervix and uterus: Secondary | ICD-10-CM | POA: Diagnosis not present

## 2015-08-20 DIAGNOSIS — J690 Pneumonitis due to inhalation of food and vomit: Secondary | ICD-10-CM | POA: Diagnosis present

## 2015-08-20 DIAGNOSIS — Z6821 Body mass index (BMI) 21.0-21.9, adult: Secondary | ICD-10-CM | POA: Diagnosis not present

## 2015-08-20 DIAGNOSIS — R198 Other specified symptoms and signs involving the digestive system and abdomen: Secondary | ICD-10-CM | POA: Diagnosis not present

## 2015-08-20 DIAGNOSIS — A419 Sepsis, unspecified organism: Secondary | ICD-10-CM | POA: Diagnosis present

## 2015-08-20 DIAGNOSIS — Z952 Presence of prosthetic heart valve: Secondary | ICD-10-CM | POA: Diagnosis not present

## 2015-08-20 DIAGNOSIS — M7989 Other specified soft tissue disorders: Secondary | ICD-10-CM | POA: Diagnosis present

## 2015-08-20 DIAGNOSIS — Z7982 Long term (current) use of aspirin: Secondary | ICD-10-CM | POA: Diagnosis not present

## 2015-08-20 DIAGNOSIS — I739 Peripheral vascular disease, unspecified: Secondary | ICD-10-CM | POA: Diagnosis present

## 2015-08-20 DIAGNOSIS — N289 Disorder of kidney and ureter, unspecified: Secondary | ICD-10-CM | POA: Diagnosis not present

## 2015-08-20 DIAGNOSIS — R0682 Tachypnea, not elsewhere classified: Secondary | ICD-10-CM | POA: Diagnosis present

## 2015-08-20 DIAGNOSIS — H409 Unspecified glaucoma: Secondary | ICD-10-CM

## 2015-08-20 DIAGNOSIS — C641 Malignant neoplasm of right kidney, except renal pelvis: Secondary | ICD-10-CM | POA: Diagnosis present

## 2015-08-20 DIAGNOSIS — K832 Perforation of bile duct: Secondary | ICD-10-CM | POA: Diagnosis present

## 2015-08-20 DIAGNOSIS — N2889 Other specified disorders of kidney and ureter: Secondary | ICD-10-CM | POA: Diagnosis not present

## 2015-08-20 DIAGNOSIS — Z7952 Long term (current) use of systemic steroids: Secondary | ICD-10-CM | POA: Diagnosis not present

## 2015-08-20 DIAGNOSIS — E876 Hypokalemia: Secondary | ICD-10-CM | POA: Diagnosis not present

## 2015-08-20 DIAGNOSIS — Z885 Allergy status to narcotic agent status: Secondary | ICD-10-CM | POA: Diagnosis not present

## 2015-08-20 DIAGNOSIS — R160 Hepatomegaly, not elsewhere classified: Secondary | ICD-10-CM

## 2015-08-20 DIAGNOSIS — R748 Abnormal levels of other serum enzymes: Secondary | ICD-10-CM | POA: Diagnosis present

## 2015-08-20 DIAGNOSIS — Z9049 Acquired absence of other specified parts of digestive tract: Secondary | ICD-10-CM | POA: Diagnosis not present

## 2015-08-20 DIAGNOSIS — N179 Acute kidney failure, unspecified: Secondary | ICD-10-CM | POA: Diagnosis present

## 2015-08-20 DIAGNOSIS — K315 Obstruction of duodenum: Secondary | ICD-10-CM | POA: Diagnosis present

## 2015-08-20 DIAGNOSIS — I10 Essential (primary) hypertension: Secondary | ICD-10-CM | POA: Diagnosis present

## 2015-08-20 DIAGNOSIS — I130 Hypertensive heart and chronic kidney disease with heart failure and stage 1 through stage 4 chronic kidney disease, or unspecified chronic kidney disease: Secondary | ICD-10-CM | POA: Diagnosis present

## 2015-08-20 DIAGNOSIS — K91871 Postprocedural hematoma of a digestive system organ or structure following other procedure: Secondary | ICD-10-CM | POA: Diagnosis present

## 2015-08-20 DIAGNOSIS — Z823 Family history of stroke: Secondary | ICD-10-CM | POA: Diagnosis not present

## 2015-08-20 DIAGNOSIS — K311 Adult hypertrophic pyloric stenosis: Secondary | ICD-10-CM | POA: Diagnosis present

## 2015-08-20 DIAGNOSIS — I350 Nonrheumatic aortic (valve) stenosis: Secondary | ICD-10-CM | POA: Diagnosis present

## 2015-08-20 DIAGNOSIS — M1990 Unspecified osteoarthritis, unspecified site: Secondary | ICD-10-CM | POA: Diagnosis present

## 2015-08-20 LAB — CBC WITH DIFFERENTIAL/PLATELET
BASOS ABS: 0 10*3/uL (ref 0.0–0.1)
BASOS PCT: 0 %
EOS PCT: 0 %
Eosinophils Absolute: 0 10*3/uL (ref 0.0–0.7)
HEMATOCRIT: 38.8 % (ref 36.0–46.0)
Hemoglobin: 13.5 g/dL (ref 12.0–15.0)
Lymphocytes Relative: 12 %
Lymphs Abs: 1.3 10*3/uL (ref 0.7–4.0)
MCH: 30.5 pg (ref 26.0–34.0)
MCHC: 34.8 g/dL (ref 30.0–36.0)
MCV: 87.8 fL (ref 78.0–100.0)
MONO ABS: 0.7 10*3/uL (ref 0.1–1.0)
MONOS PCT: 6 %
NEUTROS ABS: 8.7 10*3/uL — AB (ref 1.7–7.7)
Neutrophils Relative %: 82 %
PLATELETS: 302 10*3/uL (ref 150–400)
RBC: 4.42 MIL/uL (ref 3.87–5.11)
RDW: 13.2 % (ref 11.5–15.5)
WBC: 10.7 10*3/uL — ABNORMAL HIGH (ref 4.0–10.5)

## 2015-08-20 LAB — BASIC METABOLIC PANEL
Anion gap: 10 (ref 5–15)
BUN: 21 mg/dL — ABNORMAL HIGH (ref 6–20)
CHLORIDE: 95 mmol/L — AB (ref 101–111)
CO2: 25 mmol/L (ref 22–32)
Calcium: 8.6 mg/dL — ABNORMAL LOW (ref 8.9–10.3)
Creatinine, Ser: 1.02 mg/dL — ABNORMAL HIGH (ref 0.44–1.00)
GFR calc Af Amer: 57 mL/min — ABNORMAL LOW (ref >60)
GFR calc non Af Amer: 49 mL/min — ABNORMAL LOW (ref >60)
Glucose, Bld: 95 mg/dL (ref 65–99)
POTASSIUM: 3.6 mmol/L (ref 3.5–5.1)
SODIUM: 130 mmol/L — AB (ref 135–145)

## 2015-08-20 LAB — CBC
HEMATOCRIT: 34.9 % — AB (ref 36.0–46.0)
Hemoglobin: 12.1 g/dL (ref 12.0–15.0)
MCH: 30 pg (ref 26.0–34.0)
MCHC: 34.7 g/dL (ref 30.0–36.0)
MCV: 86.6 fL (ref 78.0–100.0)
Platelets: 257 10*3/uL (ref 150–400)
RBC: 4.03 MIL/uL (ref 3.87–5.11)
RDW: 13.2 % (ref 11.5–15.5)
WBC: 14.3 10*3/uL — AB (ref 4.0–10.5)

## 2015-08-20 LAB — URINALYSIS, ROUTINE W REFLEX MICROSCOPIC
BILIRUBIN URINE: NEGATIVE
Glucose, UA: NEGATIVE mg/dL
Ketones, ur: NEGATIVE mg/dL
LEUKOCYTES UA: NEGATIVE
Nitrite: NEGATIVE
PH: 8 (ref 5.0–8.0)
Protein, ur: 100 mg/dL — AB
SPECIFIC GRAVITY, URINE: 1.018 (ref 1.005–1.030)

## 2015-08-20 LAB — LACTIC ACID, PLASMA
Lactic Acid, Venous: 0.9 mmol/L (ref 0.5–2.0)
Lactic Acid, Venous: 0.9 mmol/L (ref 0.5–2.0)

## 2015-08-20 LAB — PROCALCITONIN: PROCALCITONIN: 0.75 ng/mL

## 2015-08-20 LAB — I-STAT TROPONIN, ED: TROPONIN I, POC: 0.01 ng/mL (ref 0.00–0.08)

## 2015-08-20 LAB — URINE MICROSCOPIC-ADD ON

## 2015-08-20 LAB — PROTIME-INR
INR: 1.24 (ref 0.00–1.49)
PROTHROMBIN TIME: 15.7 s — AB (ref 11.6–15.2)

## 2015-08-20 LAB — APTT: APTT: 38 s — AB (ref 24–37)

## 2015-08-20 MED ORDER — VANCOMYCIN HCL IN DEXTROSE 1-5 GM/200ML-% IV SOLN
1000.0000 mg | INTRAVENOUS | Status: DC
  Administered 2015-08-21 – 2015-08-23 (×3): 1000 mg via INTRAVENOUS
  Filled 2015-08-20 (×3): qty 200

## 2015-08-20 MED ORDER — HYDROMORPHONE HCL 1 MG/ML IJ SOLN
0.5000 mg | INTRAMUSCULAR | Status: DC | PRN
  Administered 2015-08-31: 1 mg via INTRAVENOUS
  Filled 2015-08-20: qty 1

## 2015-08-20 MED ORDER — ONDANSETRON HCL 4 MG/2ML IJ SOLN
4.0000 mg | Freq: Four times a day (QID) | INTRAMUSCULAR | Status: DC | PRN
  Administered 2015-08-27 – 2015-08-31 (×4): 4 mg via INTRAVENOUS
  Filled 2015-08-20 (×2): qty 2

## 2015-08-20 MED ORDER — CETYLPYRIDINIUM CHLORIDE 0.05 % MT LIQD
7.0000 mL | Freq: Two times a day (BID) | OROMUCOSAL | Status: DC
  Administered 2015-08-20 – 2015-09-06 (×31): 7 mL via OROMUCOSAL

## 2015-08-20 MED ORDER — IOHEXOL 300 MG/ML  SOLN
25.0000 mL | Freq: Once | INTRAMUSCULAR | Status: AC | PRN
  Administered 2015-08-23: 25 mL via ORAL

## 2015-08-20 MED ORDER — SODIUM CHLORIDE 0.9 % IV SOLN
INTRAVENOUS | Status: AC
  Administered 2015-08-20: 05:00:00 via INTRAVENOUS

## 2015-08-20 MED ORDER — ONDANSETRON HCL 4 MG PO TABS: 4.0000 mg | ORAL_TABLET | Freq: Four times a day (QID) | ORAL | Status: DC | PRN

## 2015-08-20 MED ORDER — PIPERACILLIN-TAZOBACTAM 3.375 G IVPB
3.3750 g | Freq: Three times a day (TID) | INTRAVENOUS | Status: DC
  Administered 2015-08-20 – 2015-08-24 (×13): 3.375 g via INTRAVENOUS
  Filled 2015-08-20 (×13): qty 50

## 2015-08-20 MED ORDER — PIPERACILLIN-TAZOBACTAM 3.375 G IVPB 30 MIN
3.3750 g | Freq: Once | INTRAVENOUS | Status: AC
  Administered 2015-08-20: 3.375 g via INTRAVENOUS
  Filled 2015-08-20: qty 50

## 2015-08-20 MED ORDER — IOHEXOL 300 MG/ML  SOLN
75.0000 mL | Freq: Once | INTRAMUSCULAR | Status: AC | PRN
  Administered 2015-08-20: 75 mL via INTRAVENOUS

## 2015-08-20 MED ORDER — ACETAMINOPHEN 325 MG PO TABS
650.0000 mg | ORAL_TABLET | Freq: Four times a day (QID) | ORAL | Status: DC | PRN
  Administered 2015-09-02 – 2015-09-05 (×6): 650 mg via ORAL
  Filled 2015-08-20 (×6): qty 2

## 2015-08-20 MED ORDER — SODIUM CHLORIDE 0.9 % IV BOLUS (SEPSIS)
1000.0000 mL | Freq: Once | INTRAVENOUS | Status: AC
  Administered 2015-08-20: 1000 mL via INTRAVENOUS

## 2015-08-20 MED ORDER — FUROSEMIDE 10 MG/ML IJ SOLN
40.0000 mg | Freq: Once | INTRAMUSCULAR | Status: AC
  Administered 2015-08-20: 40 mg via INTRAVENOUS
  Filled 2015-08-20: qty 4

## 2015-08-20 MED ORDER — ONDANSETRON HCL 4 MG/2ML IJ SOLN
4.0000 mg | Freq: Four times a day (QID) | INTRAMUSCULAR | Status: DC
  Administered 2015-08-20 – 2015-08-29 (×35): 4 mg via INTRAVENOUS
  Filled 2015-08-20 (×38): qty 2

## 2015-08-20 MED ORDER — SODIUM CHLORIDE 0.9 % IJ SOLN
3.0000 mL | Freq: Two times a day (BID) | INTRAMUSCULAR | Status: DC
  Administered 2015-08-20 – 2015-09-06 (×20): 3 mL via INTRAVENOUS

## 2015-08-20 MED ORDER — HYDRALAZINE HCL 20 MG/ML IJ SOLN: 2.0000 mg | Freq: Four times a day (QID) | INTRAMUSCULAR | Status: DC | PRN

## 2015-08-20 MED ORDER — LORAZEPAM 2 MG/ML IJ SOLN
0.5000 mg | Freq: Once | INTRAMUSCULAR | Status: AC
  Administered 2015-08-20: 0.5 mg via INTRAVENOUS
  Filled 2015-08-20: qty 1

## 2015-08-20 MED ORDER — SODIUM CHLORIDE 0.9 % IV BOLUS (SEPSIS): 1000.0000 mL | Freq: Once | INTRAVENOUS | Status: DC

## 2015-08-20 MED ORDER — ACETAMINOPHEN 650 MG RE SUPP
650.0000 mg | Freq: Four times a day (QID) | RECTAL | Status: DC | PRN
  Administered 2015-08-27 – 2015-09-01 (×6): 650 mg via RECTAL
  Filled 2015-08-20 (×6): qty 1

## 2015-08-20 MED ORDER — PROMETHAZINE HCL 25 MG/ML IJ SOLN
12.5000 mg | Freq: Four times a day (QID) | INTRAMUSCULAR | Status: DC | PRN
  Administered 2015-08-21 – 2015-09-03 (×12): 12.5 mg via INTRAVENOUS
  Filled 2015-08-20 (×13): qty 1

## 2015-08-20 MED ORDER — PANTOPRAZOLE SODIUM 40 MG IV SOLR
40.0000 mg | INTRAVENOUS | Status: DC
  Administered 2015-08-20 – 2015-08-24 (×5): 40 mg via INTRAVENOUS
  Filled 2015-08-20 (×5): qty 40

## 2015-08-20 MED ORDER — VANCOMYCIN HCL IN DEXTROSE 1-5 GM/200ML-% IV SOLN
1000.0000 mg | Freq: Once | INTRAVENOUS | Status: AC
  Administered 2015-08-20: 1000 mg via INTRAVENOUS
  Filled 2015-08-20 (×2): qty 200

## 2015-08-20 NOTE — Progress Notes (Signed)
ANTIBIOTIC CONSULT NOTE - INITIAL  Pharmacy Consult for Vancomycin & Zosyn Indication: Sepsis  Allergies  Allergen Reactions  . Tramadol Rash    After surgery pt  broke out into a rash.Pt stop taking.    Patient Measurements: Height: 5\' 6"  (167.6 cm) Weight: 136 lb 0.4 oz (61.7 kg) IBW/kg (Calculated) : 59.3  Vital Signs: Temp: 99.4 F (37.4 C) (01/02 0431) Temp Source: Oral (01/02 0431) BP: 125/77 mmHg (01/02 0431) Pulse Rate: 101 (01/02 0431) Intake/Output from previous day: 01/01 0701 - 01/02 0700 In: -  Out: 600 [Urine:100; Emesis/NG output:500] Intake/Output from this shift: Total I/O In: -  Out: 600 [Urine:100; Emesis/NG output:500]  Labs:  Recent Labs  08/19/15 2210 08/20/15 0222  WBC 14.4* 10.7*  HGB 12.4 13.5  PLT 342 302  CREATININE 1.19*  --    Estimated Creatinine Clearance: 32.9 mL/min (by C-G formula based on Cr of 1.19). No results for input(s): VANCOTROUGH, VANCOPEAK, VANCORANDOM, GENTTROUGH, GENTPEAK, GENTRANDOM, TOBRATROUGH, TOBRAPEAK, TOBRARND, AMIKACINPEAK, AMIKACINTROU, AMIKACIN in the last 72 hours.   Microbiology: No results found for this or any previous visit (from the past 720 hour(s)).  Medical History: Past Medical History  Diagnosis Date  . Hyperlipidemia   . Osteoarthritis   . Osteoporosis/osteopenia increased risk   . Glaucoma   . Aortic stenosis     myoview neg ischemia 11/2006  ---- echo 11/10: mean AVA gradient~47   ---- s/p bioprosthetic AVR 12/2009  . Polymyalgia rheumatica ([facility]) 11/05/2010  . PVD (peripheral vascular disease) ([facility])     left leg swells  . Hypertension     pt. states does not have HTN, no meds    Medications:  Scheduled:  . LORazepam  0.5 mg Intravenous Once  . ondansetron (ZOFRAN) IV  4 mg Intravenous 4 times per day  . pantoprazole (PROTONIX) IV  40 mg Intravenous Q24H  . piperacillin-tazobactam (ZOSYN)  IV  3.375 g Intravenous Q8H  . sodium chloride  1,000 mL Intravenous Once  . sodium chloride   3 mL Intravenous Q12H  . vancomycin  1,000 mg Intravenous Once  . [START ON 08/21/2015] vancomycin  1,000 mg Intravenous Q24H   Infusions:  . sodium chloride     Assessment:  82 yr female with new diagnosis of renal mass and hepatic lesion presents with vomiting & abdominal pain.  Started Votrient on 12/30.    CTAngio shows fluid collection that is concerning for evolving abscess  Vancomycin 1gm and Zosyn 3.375gm IV x 1 dose each ordered in ED  Upon admission, pharmacy consulted to dose Vancomycin and Zosyn for sepsis  CrCl ~ 32 ml/min   1/2 >>Vanc >> 1/2 >>Zosyn >>    1/2 blood:  Trough/Dose change info:  Goal of Therapy:  Vancomycin trough level 15-20 mcg/ml  Plan:  Measure antibiotic drug levels at steady state Follow up culture results   Zosyn 3.375gm IV q8h (each dose infused over 4 hrs)  Vancomycin 1gm IV q24h  Malayah Demuro, Toribio Harbour, PharmD 08/20/2015,4:44 AM

## 2015-08-20 NOTE — Consult Note (Signed)
Reason for Consult:abd fluid collection Referring Physician: Dr Ezequiel Essex is an 82 y.o. female.  HPI: Pt with recent biopsy of a Large Right Renal Mass and a Hepatic lesion on 08/10/2015 who presents to the ED with complaints of Nausea and Vomiting and Fever and Chills for the past 2 days. Her temperature was 101 at home. She has had decreased appetite and poor intake of foods and liquids. She has had some ABD pain. A CT scan of the ABD/Pelvis was performed and revealed a fluid collection extending from the antrum of the stomach to the Hepatic Flexure measuring 7.0 x 5.7 x 5.2 cm.  Pt placed on IV Vancomycin and Zosyn.   Past Medical History  Diagnosis Date  . Hyperlipidemia   . Osteoarthritis   . Osteoporosis/osteopenia increased risk   . Glaucoma   . Aortic stenosis     myoview neg ischemia 11/2006  ---- echo 11/10: mean AVA gradient~47   ---- s/p bioprosthetic AVR 12/2009  . Polymyalgia rheumatica ([place]) 11/05/2010  . PVD (peripheral vascular disease) (H[facility])     left leg swells  . Hypertension     pt. states does not have HTN, no meds    Past Surgical History  Procedure Laterality Date  . Appendectomy    . Tonsillectomy    . Cholecystectomy  1958  . Cardiac valve replacement  May 2011    aortic valve  Porcine  . Carpal tunnel release  12/2010    left hand  . Cataract/glaucoma surgery  11/2010    [facility]  . Breast surgery  1985    [facility]  . Abdominal hysterectomy      for fibroids  . Mastectomy      was only for removal of cysts, benign  . Eye surgery  01/2011    for glaucoma  . Carpal tunnel release  08/29/2011    Procedure: CARPAL TUNNEL RELEASE;  Surgeon: Cammie Sickle., MD;  Location: [place];  Service: Orthopedics;  Laterality: Right;    Family History  Problem Relation Age of Onset  . Deep vein thrombosis Mother   . Heart attack Father   . Stroke Father   . Osteoporosis      aunt    Social History:  reports that she  quit smoking about 37 years ago. Her smoking use included Cigarettes. She smoked 0.50 packs per day. She does not have any smokeless tobacco history on file. She reports that she does not drink alcohol. Her drug history is not on file.  Allergies:  Allergies  Allergen Reactions  . Tramadol Rash    After surgery pt  broke out into a rash.Pt stop taking.    Medications: I have reviewed the patient's current medications.  Results for orders placed or performed during the hospital encounter of 08/19/15 (from the past 48 hour(s))  Lipase, blood     Status: None   Collection Time: 08/19/15 10:10 PM  Result Value Ref Range   Lipase 50 11 - 51 U/L  Comprehensive metabolic panel     Status: Abnormal   Collection Time: 08/19/15 10:10 PM  Result Value Ref Range   Sodium 130 (L) 135 - 145 mmol/L   Potassium 4.1 3.5 - 5.1 mmol/L   Chloride 91 (L) 101 - 111 mmol/L   CO2 27 22 - 32 mmol/L   Glucose, Bld 107 (H) 65 - 99 mg/dL   BUN 24 (H) 6 - 20 mg/dL  Creatinine, Ser 1.19 (H) 0.44 - 1.00 mg/dL   Calcium 9.6 8.9 - 10.3 mg/dL   Total Protein 7.8 6.5 - 8.1 g/dL   Albumin 3.5 3.5 - 5.0 g/dL   AST 59 (H) 15 - 41 U/L   ALT 59 (H) 14 - 54 U/L   Alkaline Phosphatase 181 (H) 38 - 126 U/L   Total Bilirubin 0.9 0.3 - 1.2 mg/dL   GFR calc non Af Amer 41 (L) >60 mL/min   GFR calc Af Amer 47 (L) >60 mL/min    Comment: (NOTE) The eGFR has been calculated using the CKD EPI equation. This calculation has not been validated in all clinical situations. eGFR's persistently <60 mL/min signify possible Chronic Kidney Disease.    Anion gap 12 5 - 15  CBC     Status: Abnormal   Collection Time: 08/19/15 10:10 PM  Result Value Ref Range   WBC 14.4 (H) 4.0 - 10.5 K/uL   RBC 4.12 3.87 - 5.11 MIL/uL   Hemoglobin 12.4 12.0 - 15.0 g/dL   HCT 36.3 36.0 - 46.0 %   MCV 88.1 78.0 - 100.0 fL   MCH 30.1 26.0 - 34.0 pg   MCHC 34.2 30.0 - 36.0 g/dL   RDW 13.3 11.5 - 15.5 %   Platelets 342 150 - 400 K/uL  I-stat  troponin, ED     Status: None   Collection Time: 08/20/15 12:15 AM  Result Value Ref Range   Troponin i, poc 0.01 0.00 - 0.08 ng/mL   Comment 3            Comment: Due to the release kinetics of cTnI, a negative result within the first hours of the onset of symptoms does not rule out myocardial infarction with certainty. If myocardial infarction is still suspected, repeat the test at appropriate intervals.   Urinalysis, Routine w reflex microscopic (not at [facility])     Status: Abnormal   Collection Time: 08/20/15 12:58 AM  Result Value Ref Range   Color, Urine YELLOW YELLOW   APPearance TURBID (A) CLEAR   Specific Gravity, Urine 1.018 1.005 - 1.030   pH 8.0 5.0 - 8.0   Glucose, UA NEGATIVE NEGATIVE mg/dL   Hgb urine dipstick LARGE (A) NEGATIVE   Bilirubin Urine NEGATIVE NEGATIVE   Ketones, ur NEGATIVE NEGATIVE mg/dL   Protein, ur 100 (A) NEGATIVE mg/dL   Nitrite NEGATIVE NEGATIVE   Leukocytes, UA NEGATIVE NEGATIVE  Urine microscopic-add on     Status: Abnormal   Collection Time: 08/20/15 12:58 AM  Result Value Ref Range   Squamous Epithelial / LPF 0-5 (A) NONE SEEN   WBC, UA 0-5 0 - 5 WBC/hpf   RBC / HPF 6-30 0 - 5 RBC/hpf   Bacteria, UA FEW (A) NONE SEEN  CBC with Differential/Platelet     Status: Abnormal   Collection Time: 08/20/15  2:22 AM  Result Value Ref Range   WBC 10.7 (H) 4.0 - 10.5 K/uL   RBC 4.42 3.87 - 5.11 MIL/uL   Hemoglobin 13.5 12.0 - 15.0 g/dL   HCT 38.8 36.0 - 46.0 %   MCV 87.8 78.0 - 100.0 fL   MCH 30.5 26.0 - 34.0 pg   MCHC 34.8 30.0 - 36.0 g/dL   RDW 13.2 11.5 - 15.5 %   Platelets 302 150 - 400 K/uL   Neutrophils Relative % 82 %   Neutro Abs 8.7 (H) 1.7 - 7.7 K/uL   Lymphocytes Relative 12 %   Lymphs Abs  1.3 0.7 - 4.0 K/uL   Monocytes Relative 6 %   Monocytes Absolute 0.7 0.1 - 1.0 K/uL   Eosinophils Relative 0 %   Eosinophils Absolute 0.0 0.0 - 0.7 K/uL   Basophils Relative 0 %   Basophils Absolute 0.0 0.0 - 0.1 K/uL  Basic metabolic panel      Status: Abnormal   Collection Time: 08/20/15  5:10 AM  Result Value Ref Range   Sodium 130 (L) 135 - 145 mmol/L   Potassium 3.6 3.5 - 5.1 mmol/L   Chloride 95 (L) 101 - 111 mmol/L   CO2 25 22 - 32 mmol/L   Glucose, Bld 95 65 - 99 mg/dL   BUN 21 (H) 6 - 20 mg/dL   Creatinine, Ser 1.02 (H) 0.44 - 1.00 mg/dL   Calcium 8.6 (L) 8.9 - 10.3 mg/dL   GFR calc non Af Amer 49 (L) >60 mL/min   GFR calc Af Amer 57 (L) >60 mL/min    Comment: (NOTE) The eGFR has been calculated using the CKD EPI equation. This calculation has not been validated in all clinical situations. eGFR's persistently <60 mL/min signify possible Chronic Kidney Disease.    Anion gap 10 5 - 15  CBC     Status: Abnormal   Collection Time: 08/20/15  5:10 AM  Result Value Ref Range   WBC 14.3 (H) 4.0 - 10.5 K/uL   RBC 4.03 3.87 - 5.11 MIL/uL   Hemoglobin 12.1 12.0 - 15.0 g/dL   HCT 34.9 (L) 36.0 - 46.0 %   MCV 86.6 78.0 - 100.0 fL   MCH 30.0 26.0 - 34.0 pg   MCHC 34.7 30.0 - 36.0 g/dL   RDW 13.2 11.5 - 15.5 %   Platelets 257 150 - 400 K/uL  Lactic acid, plasma     Status: None   Collection Time: 08/20/15  5:10 AM  Result Value Ref Range   Lactic Acid, Venous 0.9 0.5 - 2.0 mmol/L  Procalcitonin     Status: None   Collection Time: 08/20/15  5:10 AM  Result Value Ref Range   Procalcitonin 0.75 ng/mL    Comment:        Interpretation: PCT > 0.5 ng/mL and <= 2 ng/mL: Systemic infection (sepsis) is possible, but other conditions are known to elevate PCT as well. (NOTE)         ICU PCT Algorithm               Non ICU PCT Algorithm    ----------------------------     ------------------------------         PCT < 0.25 ng/mL                 PCT < 0.1 ng/mL     Stopping of antibiotics            Stopping of antibiotics       strongly encouraged.               strongly encouraged.    ----------------------------     ------------------------------       PCT level decrease by               PCT < 0.25 ng/mL       >= 80%  from peak PCT       OR PCT 0.25 - 0.5 ng/mL          Stopping of antibiotics  encouraged.     Stopping of antibiotics           encouraged.    ----------------------------     ------------------------------       PCT level decrease by              PCT >= 0.25 ng/mL       < 80% from peak PCT        AND PCT >= 0.5 ng/mL             Continuing antibiotics                                              encouraged.       Continuing antibiotics            encouraged.    ----------------------------     ------------------------------     PCT level increase compared          PCT > 0.5 ng/mL         with peak PCT AND          PCT >= 0.5 ng/mL             Escalation of antibiotics                                          strongly encouraged.      Escalation of antibiotics        strongly encouraged.   Protime-INR     Status: Abnormal   Collection Time: 08/20/15  5:10 AM  Result Value Ref Range   Prothrombin Time 15.7 (H) 11.6 - 15.2 seconds   INR 1.24 0.00 - 1.49  APTT     Status: Abnormal   Collection Time: 08/20/15  5:10 AM  Result Value Ref Range   aPTT 38 (H) 24 - 37 seconds    Comment:        IF BASELINE aPTT IS ELEVATED, SUGGEST PATIENT RISK ASSESSMENT BE USED TO DETERMINE APPROPRIATE ANTICOAGULANT THERAPY.   Lactic acid, plasma     Status: None   Collection Time: 08/20/15  8:22 AM  Result Value Ref Range   Lactic Acid, Venous 0.9 0.5 - 2.0 mmol/L    Ct Abdomen Pelvis W Contrast  08/20/2015  CLINICAL DATA:  Acute onset of nausea, vomiting and fever. Recent liver biopsy on August 10, 2015. Initial encounter. EXAM: CT ABDOMEN AND PELVIS WITH CONTRAST TECHNIQUE: Multidetector CT imaging of the abdomen and pelvis was performed using the standard protocol following bolus administration of intravenous contrast. CONTRAST:  8m OMNIPAQUE IOHEXOL 300 MG/ML  SOLN COMPARISON:  CT of the chest, abdomen and pelvis performed 08/08/2015, and MRI of  the abdomen performed 08/03/2015 FINDINGS: A trace right-sided pleural fluid is noted, new from the prior study, possibly reactive in nature. Scattered coronary artery calcifications are noted. The known multiple metastatic lesions within the liver are better characterized on prior CT; associated perfusion anomalies are not well assessed given the phase of contrast enhancement. These appear to be relatively stable in size. A large mass is again noted occupying much of the right kidney, with mild sparing of the lower pole of the right kidney. The degree of infiltration of the retroperitoneum anterior to the lumbar spine appears mildly worsened,  and the mass thus measures approximately 10.5 x 6.4 x 9.2 cm. Per correlation with recent biopsy results, this most likely reflects urothelial carcinoma. Surrounding soft tissue inflammation and trace fluid is seen. There is new vague fluid attenuation tracking adjacent to the antrum of the stomach and hepatic flexure of the colon, measuring approximately 7.0 x 5.7 x 5.2 cm, which still contains several foci of fat. Given the patient's leukocytosis, mild apparent peripheral enhancement, and the relatively low attenuation of this collection, this is concerning for evolving abscess infiltrating the colonic mesentery. It is likely not sufficiently organized to be fully drainable at this time. There is adjacent wall thickening involving the hepatic flexure of the colon, and surrounding soft tissue inflammation is noted about the omentum. The patient is status post cholecystectomy. Prominence of the common bile duct and intrahepatic biliary ducts is relatively stable from the prior study. The spleen is unremarkable in appearance. The pancreas and adrenal glands are grossly unremarkable, though mass infiltrates about portions of the right adrenal gland. The left kidney is unremarkable in appearance. On the left side, there is no evidence of hydronephrosis. No renal or ureteral  stones are seen. The small bowel is grossly unremarkable. The duodenum is decompressed and passes near the collection of fluid. The stomach is largely distended with contrast and air, and is grossly unremarkable. No acute vascular abnormalities are seen. There is scattered calcification along the abdominal aorta and its branches. The patient is status post appendectomy. Scattered diverticulosis is noted along the sigmoid colon, without evidence of diverticulitis. There is unusual prominence of varices within the retroperitoneum and pelvis. The bladder is mildly distended and grossly unremarkable. The patient is status post hysterectomy. No suspicious adnexal masses are seen. No inguinal lymphadenopathy is seen. No acute osseous abnormalities are identified. Multilevel vacuum phenomenon is noted along the lumbar spine, with mild grade 1 anterolisthesis of L4 on L5, reflecting underlying facet disease. IMPRESSION: 1. New vague fluid collection tracking adjacent to the antrum of the stomach and hepatic flexure of the colon, measuring 7.0 x 5.7 x 5.2 cm, which still contains several foci of fat. Given the patient's leukocytosis, mild apparent peripheral enhancement, and the relatively low attenuation of this collection, this is concerning for evolving abscess infiltrating the colonic mesentery. It is likely not sufficiently organized to be fully drainable at this time. 2. Adjacent associated wall thickening involving the hepatic flexure of the colon, and surrounding soft tissue inflammation about the omentum. 3. Trace right-sided pleural fluid, new from the prior study, possibly reactive in nature. 4. Large mass again noted occupying much of the right kidney, with mild sparing of the lower pole of the right kidney. There appears to be mildly worsened infiltration of the retroperitoneum anterior to the lumbar spine, and partial infiltration about the right adrenal gland; the mass thus measures approximately 10.5 x 6.4 x  9.2 cm. Per correlation with recent biopsy results, this most likely reflects urothelial carcinoma. 5. Known multiple metastatic lesions within the liver are better characterized on prior CT, with associated perfusion anomalies, not well assessed on this study due to the phase of contrast enhancement. Metastatic lesions appear relatively stable from the recent prior CT. 6. Scattered coronary artery calcifications seen. 7. Scattered calcification along the abdominal aorta and its branches. 8. Scattered diverticulosis along the sigmoid colon, without evidence of diverticulitis. 9. Unusual prominence of varices within the retroperitoneum and pelvis. 10. Mild degenerative change noted along the lumbar spine. These results were called by telephone at  the time of interpretation on 08/20/2015 at 2:32 am to Dr. Veatrice Kells, who verbally acknowledged these results. Electronically Signed   By: Garald Balding M.D.   On: 08/20/2015 02:34   Dg Chest Port 1 View  08/20/2015  CLINICAL DATA:  Acute onset of nausea.  Sepsis.  Initial encounter. EXAM: PORTABLE CHEST 1 VIEW COMPARISON:  Chest radiograph performed 01/22/2010, and CT of the chest performed 08/08/2015 FINDINGS: The lungs are well-aerated. Vascular congestion is noted. Mild bibasilar opacities may reflect mild interstitial edema or possibly pneumonia. There is no evidence of pleural effusion or pneumothorax. The cardiomediastinal silhouette is borderline normal in size. The patient is status post median sternotomy. A valve replacement is noted. No acute osseous abnormalities are seen. IMPRESSION: Vascular congestion noted. Mild bibasilar opacities may reflect mild interstitial edema or possibly pneumonia. Electronically Signed   By: Garald Balding M.D.   On: 08/20/2015 05:38    Review of Systems  Constitutional: Negative for fever and chills.  Eyes: Negative for blurred vision.  Respiratory: Negative for cough and shortness of breath.   Cardiovascular: Negative  for chest pain.  Gastrointestinal: Positive for nausea and vomiting. Negative for abdominal pain, diarrhea and constipation.  Genitourinary: Negative for dysuria, urgency and frequency.  Musculoskeletal: Negative for myalgias.  Skin: Negative for rash.  Neurological: Negative for dizziness and headaches.   Blood pressure 125/77, pulse 101, temperature 99.4 F (37.4 C), temperature source Oral, resp. rate 22, height '5\' 6"'  (1.676 m), weight 61.7 kg (136 lb 0.4 oz), SpO2 96 %. Physical Exam  Constitutional: She is oriented to person, place, and time. She appears well-developed and well-nourished. No distress.  HENT:  Head: Normocephalic and atraumatic.  Eyes: Conjunctivae and EOM are normal. Pupils are equal, round, and reactive to light.  Neck: Normal range of motion. Neck supple.  Cardiovascular: Normal rate and regular rhythm.   Respiratory: Effort normal and breath sounds normal.  GI: Soft. She exhibits no distension. There is no tenderness.  Musculoskeletal: Normal range of motion.  Neurological: She is alert and oriented to person, place, and time.  Skin: She is not diaphoretic.    Assessment/Plan: 82 y.o. F with metastatic RCC and s/p recent liver biopsy.  It seems most likely that she had some bleeding from her biopsy that has accumulated in the lesser sac and causing irritation and inflammation of her stomach.  Agree with antibiotics for now.  WBC trending down.  May need repeat imaging in the future, but no indications for surgery at the moment.  Will try some clear liquids today and see how she does with this.       Lakia Gritton C. 03/26/8915, 94:50 AM

## 2015-08-20 NOTE — H&P (Signed)
[facility] Admission History and Physical       Kim Donovan [id number] DOB: 1929/04/08 DOA: 08/19/2015  Referring physician: EDP PCP: Mathews Argyle, MD  Specialists:   Chief Complaint: ABD Pain Nausea and Vomiting and Fever  HPI: Kim Donovan is a 82 y.o. female with a history of HTN. PMR, Hyperlipidemia, and Glaucoma, with recent biopsy of a Large Right Renal Mass and a Hepatic lesion on 08/10/2015 who presents to the ED with complaints of Nausea and Vomiting and Fever and Chills for the past 2 days.    Her temperature was 101 at home.   She has had decreased appetite and poor intake of foods and liquids.   She has had some ABD pain.  A CT scan of the ABD/Pelvis was performed and revealed a Large Right Kidney Mass  And Multiple Metastatic Lesions in the Liver which were known, and a fluid collection extending from the antrum of the stomach to the Hepatic Flexure measuring 7.0 x 5.7 x 5.2 cm In the ED, a Sepsis workup was initiated and she was placed on IV Vancomycin and Zosyn.   The case was discussed with General Surgery On Call Dr Redmond Pulling who reveiwed the Ct findings as well, and General Surgery will see her this AM.     The Medical history was given by the patient with assistance from her niece Dr. [name] (Cardiologist) who is at the bedside.      Review of Systems:  Constitutional: No Weight Loss, No Weight Gain, Night Sweats, +Fevers, Chills, Dizziness, Light Headedness, Fatigue, or Generalized Weakness HEENT: No Headaches, Difficulty Swallowing,Tooth/Dental Problems,Sore Throat,  No Sneezing, Rhinitis, Ear Ache, Nasal Congestion, or Post Nasal Drip,  Cardio-vascular:  No Chest pain, Orthopnea, PND, Edema in Lower Extremities, Anasarca, Dizziness, Palpitations  Resp: No Dyspnea, No DOE, No Productive Cough, No Non-Productive Cough, No Hemoptysis, No Wheezing.    GI: No Heartburn, Indigestion, +Abdominal Pain, Nausea, Vomiting, Diarrhea, Constipation,  Hematemesis, Hematochezia, Melena, Change in Bowel Habits,  +Loss of Appetite  GU: No Dysuria, No Change in Color of Urine, No Urgency or Urinary Frequency, No Flank pain.  Musculoskeletal: No Joint Pain or Swelling, No Decreased Range of Motion, No Back Pain.  Neurologic: No Syncope, No Seizures, Muscle Weakness, Paresthesia, Vision Disturbance or Loss, No Diplopia, No Vertigo, No Difficulty Walking,  Skin: No Rash or Lesions. Psych: No Change in Mood or Affect, No Depression or Anxiety, No Memory loss, No Confusion, or Hallucinations   Past Medical History  Diagnosis Date  . Hyperlipidemia   . Osteoarthritis   . Osteoporosis/osteopenia increased risk   . Glaucoma   . Aortic stenosis     myoview neg ischemia 11/2006  ---- echo 11/10: mean AVA gradient~47   ---- s/p bioprosthetic AVR 12/2009  . Polymyalgia rheumatica ([place]) 11/05/2010  . PVD (peripheral vascular disease) (HCC)     left leg swells  . Hypertension     pt. states does not have HTN, no meds     Past Surgical History  Procedure Laterality Date  . Appendectomy    . Tonsillectomy    . Cholecystectomy  1958  . Cardiac valve replacement  May 2011    aortic valve  . Carpal tunnel release  12/2010    left hand  . Cataract/glaucoma surgery  11/2010    [facility]  . Breast surgery  1985    bx  . Abdominal hysterectomy      for fibroids  . Mastectomy  was only for removal of cysts, benign  . Eye surgery  01/2011    for glaucoma  . Carpal tunnel release  08/29/2011    Procedure: CARPAL TUNNEL RELEASE;  Surgeon: Cammie Sickle., MD;  Location: [facility];  Service: Orthopedics;  Laterality: Right;      Prior to Admission medications   Medication Sig Start Date End Date Taking? Authorizing Provider  ALPRAZolam (XANAX) 0.25 MG tablet Take 1 tablet (0.25 mg total) by mouth at bedtime as needed for anxiety. 08/06/15  Yes Ladell Pier, MD  aspirin 81 MG tablet Take 81 mg by mouth daily.     Yes  Historical Provider, MD  Calcium Citrate-Vitamin D 250-200 MG-UNIT TABS Take 2 tablets by mouth daily.    Yes Historical Provider, MD  Diphenhydramine-APAP, sleep, (TYLENOL PM EXTRA STRENGTH PO) Take 1 tablet by mouth at bedtime as needed (pain/sleep).    Yes Historical Provider, MD  dorzolamide-timolol (COSOPT) 22.3-6.8 MG/ML ophthalmic solution Place 1 drop into the left eye 2 (two) times daily.    Yes Historical Provider, MD  furosemide (LASIX) 20 MG tablet Take 1 tablet (20 mg total) by mouth daily. 05/15/15  Yes Larey Dresser, MD  Multiple Vitamins-Minerals (CENTRUM SILVER ULTRA WOMENS PO) Take 1 tablet by mouth daily.     Yes Historical Provider, MD  Omega-3 Fatty Acids (FISH OIL) 1000 MG CAPS Take 1 capsule by mouth daily. Reported on 08/06/2015   Yes Historical Provider, MD  pazopanib (VOTRIENT) 200 MG tablet Take 4 tablets (800 mg total) by mouth daily. Take on an empty stomach. 08/14/15  Yes Ladell Pier, MD  PREDNISONE, PAK, PO Take 2.5 mg by mouth every other day.    Yes Historical Provider, MD  ezetimibe (ZETIA) 10 MG tablet Take 1 tablet (10 mg total) by mouth daily. 05/12/14   Jolaine Artist, MD  potassium chloride (K-DUR) 10 MEQ tablet Take 1 tablet (10 mEq total) by mouth daily. Patient not taking: Reported on 08/14/2015 05/15/15   Larey Dresser, MD     Allergies  Allergen Reactions  . Tramadol Rash    After surgery pt  broke out into a rash.Pt stop taking.    Social History:  reports that she quit smoking about 37 years ago. Her smoking use included Cigarettes. She smoked 0.50 packs per day. She does not have any smokeless tobacco history on file. She reports that she does not drink alcohol. Her drug history is not on file.    Family History  Problem Relation Age of Onset  . Deep vein thrombosis Mother   . Heart attack Father   . Stroke Father   . Osteoporosis      aunt       Physical Exam:  GEN:  Pleasant Well Nourished and Well Developed Elderly  82 y.o.  Caucasian female examined and in no acute distress; cooperative with exam Filed Vitals:   08/20/15 0130 08/20/15 0140 08/20/15 0238 08/20/15 0321  BP: 169/95  180/110 166/101  Pulse: 92   100  Temp:  97.9 F (36.6 C)    TempSrc:  Oral    Resp: 26   30  SpO2: 95%   95%   Blood pressure 166/101, pulse 100, temperature 97.9 F (36.6 C), temperature source Oral, resp. rate 30, SpO2 95 %. PSYCH: She is alert and oriented x4; does not appear anxious does not appear depressed; affect is normal HEENT: Normocephalic and Atraumatic, Mucous membranes pink; PERRLA; EOM  intact; Fundi:  Benign;  No scleral icterus, Nares: Patent, Oropharynx: Clear,  Fair Dentition,    Neck:  FROM, No Cervical Lymphadenopathy nor Thyromegaly or Carotid Bruit; No JVD; Breasts:: Not examined CHEST WALL: No tenderness CHEST: Normal respiration, clear to auscultation bilaterally HEART: Regular rate and rhythm; no murmurs rubs or gallops BACK: No kyphosis or scoliosis; No CVA tenderness ABDOMEN: Positive Bowel Sounds, Soft Non-Tender, No Rebound or Guarding; No Masses, No Organomegaly.    Rectal Exam: Not done EXTREMITIES: No Cyanosis, Clubbing, or Edema; No Ulcerations. Genitalia: not examined PULSES: 2+ and symmetric SKIN: Normal hydration no rash or ulceration CNS:  Alert and Oriented x 4, No Focal Deficits Vascular: pulses palpable throughout    Labs on Admission:  Basic Metabolic Panel:  Recent Labs Lab 08/19/15 2210  NA 130*  K 4.1  CL 91*  CO2 27  GLUCOSE 107*  BUN 24*  CREATININE 1.19*  CALCIUM 9.6   Liver Function Tests:  Recent Labs Lab 08/19/15 2210  AST 59*  ALT 59*  ALKPHOS 181*  BILITOT 0.9  PROT 7.8  ALBUMIN 3.5    Recent Labs Lab 08/19/15 2210  LIPASE 50   No results for input(s): AMMONIA in the last 168 hours. CBC:  Recent Labs Lab 08/19/15 2210 08/20/15 0222  WBC 14.4* 10.7*  NEUTROABS  --  8.7*  HGB 12.4 13.5  HCT 36.3 38.8  MCV 88.1 87.8  PLT 342 302    Cardiac Enzymes: No results for input(s): CKTOTAL, CKMB, CKMBINDEX, TROPONINI in the last 168 hours.  BNP (last 3 results)  Recent Labs  05/15/15 1115 06/15/15 0955  BNP 111.4* 100.1*    ProBNP (last 3 results) No results for input(s): PROBNP in the last 8760 hours.  CBG: No results for input(s): GLUCAP in the last 168 hours.  Radiological Exams on Admission: Ct Abdomen Pelvis W Contrast  08/20/2015  CLINICAL DATA:  Acute onset of nausea, vomiting and fever. Recent liver biopsy on August 10, 2015. Initial encounter. EXAM: CT ABDOMEN AND PELVIS WITH CONTRAST TECHNIQUE: Multidetector CT imaging of the abdomen and pelvis was performed using the standard protocol following bolus administration of intravenous contrast. CONTRAST:  53mL OMNIPAQUE IOHEXOL 300 MG/ML  SOLN COMPARISON:  CT of the chest, abdomen and pelvis performed 08/08/2015, and MRI of the abdomen performed 08/03/2015 FINDINGS: A trace right-sided pleural fluid is noted, new from the prior study, possibly reactive in nature. Scattered coronary artery calcifications are noted. The known multiple metastatic lesions within the liver are better characterized on prior CT; associated perfusion anomalies are not well assessed given the phase of contrast enhancement. These appear to be relatively stable in size. A large mass is again noted occupying much of the right kidney, with mild sparing of the lower pole of the right kidney. The degree of infiltration of the retroperitoneum anterior to the lumbar spine appears mildly worsened, and the mass thus measures approximately 10.5 x 6.4 x 9.2 cm. Per correlation with recent biopsy results, this most likely reflects urothelial carcinoma. Surrounding soft tissue inflammation and trace fluid is seen. There is new vague fluid attenuation tracking adjacent to the antrum of the stomach and hepatic flexure of the colon, measuring approximately 7.0 x 5.7 x 5.2 cm, which still contains several foci of  fat. Given the patient's leukocytosis, mild apparent peripheral enhancement, and the relatively low attenuation of this collection, this is concerning for evolving abscess infiltrating the colonic mesentery. It is likely not sufficiently organized to be fully drainable at  this time. There is adjacent wall thickening involving the hepatic flexure of the colon, and surrounding soft tissue inflammation is noted about the omentum. The patient is status post cholecystectomy. Prominence of the common bile duct and intrahepatic biliary ducts is relatively stable from the prior study. The spleen is unremarkable in appearance. The pancreas and adrenal glands are grossly unremarkable, though mass infiltrates about portions of the right adrenal gland. The left kidney is unremarkable in appearance. On the left side, there is no evidence of hydronephrosis. No renal or ureteral stones are seen. The small bowel is grossly unremarkable. The duodenum is decompressed and passes near the collection of fluid. The stomach is largely distended with contrast and air, and is grossly unremarkable. No acute vascular abnormalities are seen. There is scattered calcification along the abdominal aorta and its branches. The patient is status post appendectomy. Scattered diverticulosis is noted along the sigmoid colon, without evidence of diverticulitis. There is unusual prominence of varices within the retroperitoneum and pelvis. The bladder is mildly distended and grossly unremarkable. The patient is status post hysterectomy. No suspicious adnexal masses are seen. No inguinal lymphadenopathy is seen. No acute osseous abnormalities are identified. Multilevel vacuum phenomenon is noted along the lumbar spine, with mild grade 1 anterolisthesis of L4 on L5, reflecting underlying facet disease. IMPRESSION: 1. New vague fluid collection tracking adjacent to the antrum of the stomach and hepatic flexure of the colon, measuring 7.0 x 5.7 x 5.2 cm, which  still contains several foci of fat. Given the patient's leukocytosis, mild apparent peripheral enhancement, and the relatively low attenuation of this collection, this is concerning for evolving abscess infiltrating the colonic mesentery. It is likely not sufficiently organized to be fully drainable at this time. 2. Adjacent associated wall thickening involving the hepatic flexure of the colon, and surrounding soft tissue inflammation about the omentum. 3. Trace right-sided pleural fluid, new from the prior study, possibly reactive in nature. 4. Large mass again noted occupying much of the right kidney, with mild sparing of the lower pole of the right kidney. There appears to be mildly worsened infiltration of the retroperitoneum anterior to the lumbar spine, and partial infiltration about the right adrenal gland; the mass thus measures approximately 10.5 x 6.4 x 9.2 cm. Per correlation with recent biopsy results, this most likely reflects urothelial carcinoma. 5. Known multiple metastatic lesions within the liver are better characterized on prior CT, with associated perfusion anomalies, not well assessed on this study due to the phase of contrast enhancement. Metastatic lesions appear relatively stable from the recent prior CT. 6. Scattered coronary artery calcifications seen. 7. Scattered calcification along the abdominal aorta and its branches. 8. Scattered diverticulosis along the sigmoid colon, without evidence of diverticulitis. 9. Unusual prominence of varices within the retroperitoneum and pelvis. 10. Mild degenerative change noted along the lumbar spine. These results were called by telephone at the time of interpretation on 08/20/2015 at 2:32 am to Dr. Veatrice Kells, who verbally acknowledged these results. Electronically Signed   By: Garald Balding M.D.   On: 08/20/2015 02:34     EKG: Independently reviewed. Normal Sinus Rhythm Rate = 82 with Diffuse Articfact     Assessment/Plan:      82 y.o.  female with  Principal Problem:   1.    Sepsis ([place])   Sepsis Protocol   IV Vancomycin and Zosyn   IVFs   Active Problems:   2.   Intraabdominal fluid collection   IV Abx   General  Surgery consulted     3.   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 Benay Spice  3 days ago    Currently on Hold      4.   Liver masses- S/P Biopsy 08/10/2015   On CT scan     Placed on Trial of Votrient ( Pazopanib) by Dr. Benay Spice    Currently on Hold        5.   AKI (acute kidney injury) ([place])   Gentle IVFs   Lasix on Hold   Monitor BUN./Cr     6.   Nausea and vomiting   PRN IV Zofran     7.   Hyponatremia- due to Dehydration   Gentle IVFs with NSS   Monitor Na+ Trend     8.   Polymyalgia rheumatica (HCC)    Chronic     9.   S/P AVR (aortic valve replacement)- Porcine Valve on ASA Rx   ASA on Hold   10.   Chronic diastolic CHF (congestive heart failure) ([facility])   Monitor I/Os   11.   Essential hypertension-  Only on Lasix Rx, BPs observed to be further increased while on Votrient    PRN IV Hydralazine while NPO   12.  GLAUCOMA   Continue CoSopt Ophthalmic Driops   13.   DVT Prophylaxis   SCDs     Code Status:     DO NOT RESUSCITATE (DNR)      Family Communication:  Sister and Brother In [place] and Dr.Paula [name] ( Niece) Present  at Bedside    Disposition Plan:    Inpatient Status        Time spent:  [facility] Pager [phone]   If [place] Please Contact the Day Rounding Team MD for [facility]  If 7PM-7AM, Please Contact Night-Floor Coverage  [email or web address] Password [email or web address] 08/20/2015, 3:32 AM     ADDENDUM:   Patient was seen and examined on 08/20/2015

## 2015-08-20 NOTE — Progress Notes (Signed)
[facility] PROGRESS NOTE  MILDERD OZANICH [id number] DOB: 27-Nov-1928 DOA: 08/19/2015 PCP: Mathews Argyle, MD  Brief narrative 82 year old female with history of recently diagnosed metastatic renal cell carcinoma with liver metastases and underwent right renal biopsy (following with Dr. Learta Codding). He started her on a trial of systemic chemotherapy with pazopanib. Other medical history includes hypertension, polymyalgia rheumatica, glaucoma and hyperlipidemia. Patient presented to the ED on 1/1 with several episodes of nausea with vomiting and fever and chills at home 2 days duration. She had a fever 101F long with poor appetite. A CT scan of the abdomen and pelvis done in the ED showed a large right kidney mass and multiple metastatic disease in the liver. Also showed fluid collection extending from the antrum of the stomach to the hepatic flexure of colon measuring 75.7 cm. Patient was found to be septic in the ED with fever off 100.19F, tachycardia and tachypnea and subsequent WBC of 14.3. He was hyponatremic with sodium of 130 and mild transaminitis. Lactic acid was unremarkable. [facility] consulted and patient admitted to hospitalist service.  Assessment/Plan: Sepsis with intra-abdominal fluid collection Continue empiric vancomycin and Zosyn. Seen by [place] [facility] with concern for intra-abdominal abscess on abdominal CT. Recommends that this is more likely bleeding from her biopsy that accumulated in the lesser sac causing inflammation to her stomach. Patient does not have any abdominal pain and her nausea and vomiting have resolved. Recommends empiric antibiotic and no surgical intervention at this time. -Started on clear liquid. -Supportive care with antiemetics.   Right renal carcinoma with liver metastases Biopsy shows poorly differentiated carcinoma favoring urothelial origin. Patient started on a trial of pazopanib by her oncologist Dr. Learta Codding 3 days back. It has  several GI side effects and has been held on admission. Dr. Learta Codding should be back in his  office tomorrow.   Acute kidney injury and hyponatremia Mild and likely associated with dehydration. Lasix discontinued and patient received IV normal saline bolus as per sepsis protocol in the ED. Patient has some congestion on exam and sooner congestion on chest x-ray. Discontinue Further IV fluids. i will order a dose of IV Lasix and monitor.. Follow renal function.  DVT prophylaxis: SCDs       Code Status: DO NOT RESUSCITATE Family Communication: None at bedside Disposition Plan: Currently inpatient   Consultants:  [place] [facility]  Procedures:  CT abdomen pelvis  Antibiotics:  IV vancomycin and Zosyn  HPI/Subjective: Admission H&P reviewed. Denies further nausea or vomiting. Denies abdominal pain.  Objective: Filed Vitals:   08/20/15 1000 08/20/15 1350  BP: 114/64 131/78  Pulse: 87 82  Temp: 98.4 F (36.9 C) 98.7 F (37.1 C)  Resp: 20 18    Intake/Output Summary (Last 24 hours) at 08/20/15 1441 Last data filed at 08/20/15 0700  Gross per 24 hour  Intake 315.67 ml  Output    600 ml  Net -284.33 ml   Filed Weights   08/20/15 0431  Weight: 61.7 kg (136 lb 0.4 oz)    Exam:   General:  Elderly thin built female not in distress  HEENT: No pallor, dry mucosa, supple neck  Chest: Bibasilar crackles  CVS: Normal S1 and S2, no murmurs  GI: Soft, nondistended, nontender, bowel sounds present  Moscoso: Warm, no edema  CNS: Alert and oriented    Data Reviewed: Basic Metabolic Panel:  Recent Labs Lab 08/19/15 2210 08/20/15 0510  NA 130* 130*  K 4.1 3.6  CL 91* 95*  CO2 27 25  GLUCOSE 107* 95  BUN 24* 21*  CREATININE 1.19* 1.02*  CALCIUM 9.6 8.6*   Liver Function Tests:  Recent Labs Lab 08/19/15 2210  AST 59*  ALT 59*  ALKPHOS 181*  BILITOT 0.9  PROT 7.8  ALBUMIN 3.5    Recent Labs Lab 08/19/15 2210  LIPASE 50   No results for  input(s): AMMONIA in the last 168 hours. CBC:  Recent Labs Lab 08/19/15 2210 08/20/15 0222 08/20/15 0510  WBC 14.4* 10.7* 14.3*  NEUTROABS  --  8.7*  --   HGB 12.4 13.5 12.1  HCT 36.3 38.8 34.9*  MCV 88.1 87.8 86.6  PLT 342 302 257   Cardiac Enzymes: No results for input(s): CKTOTAL, CKMB, CKMBINDEX, TROPONINI in the last 168 hours. BNP (last 3 results)  Recent Labs  05/15/15 1115 06/15/15 0955  BNP 111.4* 100.1*    ProBNP (last 3 results) No results for input(s): PROBNP in the last 8760 hours.  CBG: No results for input(s): GLUCAP in the last 168 hours.  No results found for this or any previous visit (from the past 240 hour(s)).   Studies: Ct Abdomen Pelvis W Contrast  08/20/2015  CLINICAL DATA:  Acute onset of nausea, vomiting and fever. Recent liver biopsy on August 10, 2015. Initial encounter. EXAM: CT ABDOMEN AND PELVIS WITH CONTRAST TECHNIQUE: Multidetector CT imaging of the abdomen and pelvis was performed using the standard protocol following bolus administration of intravenous contrast. CONTRAST:  68mL OMNIPAQUE IOHEXOL 300 MG/ML  SOLN COMPARISON:  CT of the chest, abdomen and pelvis performed 08/08/2015, and MRI of the abdomen performed 08/03/2015 FINDINGS: A trace right-sided pleural fluid is noted, new from the prior study, possibly reactive in nature. Scattered coronary artery calcifications are noted. The known multiple metastatic lesions within the liver are better characterized on prior CT; associated perfusion anomalies are not well assessed given the phase of contrast enhancement. These appear to be relatively stable in size. A large mass is again noted occupying much of the right kidney, with mild sparing of the lower pole of the right kidney. The degree of infiltration of the retroperitoneum anterior to the lumbar spine appears mildly worsened, and the mass thus measures approximately 10.5 x 6.4 x 9.2 cm. Per correlation with recent biopsy results, this most  likely reflects urothelial carcinoma. Surrounding soft tissue inflammation and trace fluid is seen. There is new vague fluid attenuation tracking adjacent to the antrum of the stomach and hepatic flexure of the colon, measuring approximately 7.0 x 5.7 x 5.2 cm, which still contains several foci of fat. Given the patient's leukocytosis, mild apparent peripheral enhancement, and the relatively low attenuation of this collection, this is concerning for evolving abscess infiltrating the colonic mesentery. It is likely not sufficiently organized to be fully drainable at this time. There is adjacent wall thickening involving the hepatic flexure of the colon, and surrounding soft tissue inflammation is noted about the omentum. The patient is status post cholecystectomy. Prominence of the common bile duct and intrahepatic biliary ducts is relatively stable from the prior study. The spleen is unremarkable in appearance. The pancreas and adrenal glands are grossly unremarkable, though mass infiltrates about portions of the right adrenal gland. The left kidney is unremarkable in appearance. On the left side, there is no evidence of hydronephrosis. No renal or ureteral stones are seen. The small bowel is grossly unremarkable. The duodenum is decompressed and passes near the collection of fluid. The stomach is largely distended with contrast and air, and is  grossly unremarkable. No acute vascular abnormalities are seen. There is scattered calcification along the abdominal aorta and its branches. The patient is status post appendectomy. Scattered diverticulosis is noted along the sigmoid colon, without evidence of diverticulitis. There is unusual prominence of varices within the retroperitoneum and pelvis. The bladder is mildly distended and grossly unremarkable. The patient is status post hysterectomy. No suspicious adnexal masses are seen. No inguinal lymphadenopathy is seen. No acute osseous abnormalities are identified.  Multilevel vacuum phenomenon is noted along the lumbar spine, with mild grade 1 anterolisthesis of L4 on L5, reflecting underlying facet disease. IMPRESSION: 1. New vague fluid collection tracking adjacent to the antrum of the stomach and hepatic flexure of the colon, measuring 7.0 x 5.7 x 5.2 cm, which still contains several foci of fat. Given the patient's leukocytosis, mild apparent peripheral enhancement, and the relatively low attenuation of this collection, this is concerning for evolving abscess infiltrating the colonic mesentery. It is likely not sufficiently organized to be fully drainable at this time. 2. Adjacent associated wall thickening involving the hepatic flexure of the colon, and surrounding soft tissue inflammation about the omentum. 3. Trace right-sided pleural fluid, new from the prior study, possibly reactive in nature. 4. Large mass again noted occupying much of the right kidney, with mild sparing of the lower pole of the right kidney. There appears to be mildly worsened infiltration of the retroperitoneum anterior to the lumbar spine, and partial infiltration about the right adrenal gland; the mass thus measures approximately 10.5 x 6.4 x 9.2 cm. Per correlation with recent biopsy results, this most likely reflects urothelial carcinoma. 5. Known multiple metastatic lesions within the liver are better characterized on prior CT, with associated perfusion anomalies, not well assessed on this study due to the phase of contrast enhancement. Metastatic lesions appear relatively stable from the recent prior CT. 6. Scattered coronary artery calcifications seen. 7. Scattered calcification along the abdominal aorta and its branches. 8. Scattered diverticulosis along the sigmoid colon, without evidence of diverticulitis. 9. Unusual prominence of varices within the retroperitoneum and pelvis. 10. Mild degenerative change noted along the lumbar spine. These results were called by telephone at the time of  interpretation on 08/20/2015 at 2:32 am to Dr. Veatrice Kells, who verbally acknowledged these results. Electronically Signed   By: Garald Balding M.D.   On: 08/20/2015 02:34   Dg Chest Port 1 View  08/20/2015  CLINICAL DATA:  Acute onset of nausea.  Sepsis.  Initial encounter. EXAM: PORTABLE CHEST 1 VIEW COMPARISON:  Chest radiograph performed 01/22/2010, and CT of the chest performed 08/08/2015 FINDINGS: The lungs are well-aerated. Vascular congestion is noted. Mild bibasilar opacities may reflect mild interstitial edema or possibly pneumonia. There is no evidence of pleural effusion or pneumothorax. The cardiomediastinal silhouette is borderline normal in size. The patient is status post median sternotomy. A valve replacement is noted. No acute osseous abnormalities are seen. IMPRESSION: Vascular congestion noted. Mild bibasilar opacities may reflect mild interstitial edema or possibly pneumonia. Electronically Signed   By: Garald Balding M.D.   On: 08/20/2015 05:38    Scheduled Meds: . antiseptic oral rinse  7 mL Mouth Rinse BID  . ondansetron (ZOFRAN) IV  4 mg Intravenous 4 times per day  . pantoprazole (PROTONIX) IV  40 mg Intravenous Q24H  . piperacillin-tazobactam (ZOSYN)  IV  3.375 g Intravenous Q8H  . sodium chloride  1,000 mL Intravenous Once  . sodium chloride  3 mL Intravenous Q12H  . [START ON 08/21/2015]  vancomycin  1,000 mg Intravenous Q24H   Continuous Infusions: . sodium chloride 10 mL/hr at 08/20/15 0611      Time spent: 20 minutes    Allye Hoyos  [facility] Pager [phone]. If 7PM-7AM, please contact night-coverage at [email or web address], password [facility] 08/20/2015, 2:41 PM  LOS: 0 days

## 2015-08-21 DIAGNOSIS — R509 Fever, unspecified: Secondary | ICD-10-CM

## 2015-08-21 DIAGNOSIS — N289 Disorder of kidney and ureter, unspecified: Secondary | ICD-10-CM

## 2015-08-21 DIAGNOSIS — C787 Secondary malignant neoplasm of liver and intrahepatic bile duct: Secondary | ICD-10-CM

## 2015-08-21 DIAGNOSIS — R112 Nausea with vomiting, unspecified: Secondary | ICD-10-CM

## 2015-08-21 LAB — BASIC METABOLIC PANEL
ANION GAP: 9 (ref 5–15)
BUN: 26 mg/dL — AB (ref 6–20)
CALCIUM: 8.4 mg/dL — AB (ref 8.9–10.3)
CO2: 26 mmol/L (ref 22–32)
Chloride: 95 mmol/L — ABNORMAL LOW (ref 101–111)
Creatinine, Ser: 1.41 mg/dL — ABNORMAL HIGH (ref 0.44–1.00)
GFR calc Af Amer: 38 mL/min — ABNORMAL LOW (ref >60)
GFR, EST NON AFRICAN AMERICAN: 33 mL/min — AB (ref >60)
GLUCOSE: 107 mg/dL — AB (ref 65–99)
POTASSIUM: 3.3 mmol/L — AB (ref 3.5–5.1)
SODIUM: 130 mmol/L — AB (ref 135–145)

## 2015-08-21 LAB — CBC
HCT: 36 % (ref 36.0–46.0)
Hemoglobin: 12.1 g/dL (ref 12.0–15.0)
MCH: 30 pg (ref 26.0–34.0)
MCHC: 33.6 g/dL (ref 30.0–36.0)
MCV: 89.1 fL (ref 78.0–100.0)
PLATELETS: 259 10*3/uL (ref 150–400)
RBC: 4.04 MIL/uL (ref 3.87–5.11)
RDW: 13.6 % (ref 11.5–15.5)
WBC: 13 10*3/uL — AB (ref 4.0–10.5)

## 2015-08-21 MED ORDER — SODIUM CHLORIDE 0.9 % IV SOLN
INTRAVENOUS | Status: DC
  Administered 2015-08-21 (×3): via INTRAVENOUS

## 2015-08-21 MED ORDER — POTASSIUM CHLORIDE CRYS ER 20 MEQ PO TBCR
40.0000 meq | EXTENDED_RELEASE_TABLET | Freq: Once | ORAL | Status: AC
  Administered 2015-08-21: 40 meq via ORAL
  Filled 2015-08-21: qty 2

## 2015-08-21 MED ORDER — BOOST PLUS PO LIQD
237.0000 mL | Freq: Three times a day (TID) | ORAL | Status: DC
  Administered 2015-08-23 – 2015-09-04 (×9): 237 mL via ORAL
  Filled 2015-08-21 (×42): qty 237

## 2015-08-21 NOTE — Progress Notes (Addendum)
[facility] PROGRESS NOTE  Kim Donovan [id number] DOB: [birth year] DOA: 08/19/2015 PCP: Mathews Argyle, MD  Brief narrative 82 year old female with history of recently diagnosed metastatic renal cell carcinoma with liver metastases and underwent right renal biopsy (following with Dr. Learta Codding). He started her on a trial of systemic chemotherapy with pazopanib. Other medical history includes hypertension, polymyalgia rheumatica, glaucoma and hyperlipidemia. Patient presented to the ED on 1/1 with several episodes of nausea with vomiting and fever and chills at home 2 days duration. She had a fever 101F long with poor appetite. A CT scan of the abdomen and pelvis done in the ED showed a large right kidney mass and multiple metastatic disease in the liver. Also showed fluid collection extending from the antrum of the stomach to the hepatic flexure of colon measuring 75.7 cm. Patient was found to be septic in the ED with fever off 100.62F, tachycardia and tachypnea and subsequent WBC of 14.3. He was hyponatremic with sodium of 130 and mild transaminitis. Lactic acid was unremarkable. [place] surgery consulted and patient admitted to hospitalist service.  Assessment/Plan: Sepsis with intra-abdominal fluid collection -empiric vancomycin and Zosyn. Seen by [place] surgery with concern for intra-abdominal abscess on abdominal CT. Recommends that this is more likely bleeding from her biopsy that accumulated in the lesser sac causing inflammation to her stomach. Patient does not have any abdominal pain and her nausea and vomiting improving. Recommends empiric antibiotic and no surgical intervention at this time. -Advance to full liquid.  -Supportive care with antiemetics.   Right renal carcinoma with liver metastases Biopsy shows poorly differentiated carcinoma favoring urothelial origin. Patient started on a trial of pazopanib by her oncologist Dr. Learta Codding 3 days back. Discussed with Dr.  Learta Codding. Holding her pazopanib for now.  Acute kidney injury and hyponatremia Mild and likely associated with dehydration. Holding Lasix and gentle hydration.  hypokalemia Replenished  DVT prophylaxis: SCDs       Code Status: DO NOT RESUSCITATE Family Communication: Sister at bedside Disposition Plan: Currently inpatient   Consultants:  [place] [facility]  Procedures:  CT abdomen pelvis  Antibiotics:  IV vancomycin and Zosyn since 1/1  HPI/Subjective: Seen and examined. Complains of some nausea and weakness  Objective: Filed Vitals:   08/21/15 0037 08/21/15 0536  BP: 129/79 133/79  Pulse: 93 89  Temp: 98.3 F (36.8 C) 98.3 F (36.8 C)  Resp: 22 20    Intake/Output Summary (Last 24 hours) at 08/21/15 1355 Last data filed at 08/21/15 1316  Gross per 24 hour  Intake    410 ml  Output   1650 ml  Net  -1240 ml   Filed Weights   08/20/15 0431  Weight: 61.7 kg (136 lb 0.4 oz)    Exam:   General:  Elderly thin built female not in distress  HEENT: No pallor, dry mucosa, supple neck  Chest: Clear bilaterally  CVS: Normal S1 and S2, no murmurs  GI: Soft, mild distention,, nontender, bowel sounds present  Moscoso: Warm, no edema  CNS: Alert and oriented    Data Reviewed: Basic Metabolic Panel:  Recent Labs Lab 08/19/15 2210 08/20/15 0510 08/21/15 0547  NA 130* 130* 130*  K 4.1 3.6 3.3*  CL 91* 95* 95*  CO2 27 25 26   GLUCOSE 107* 95 107*  BUN 24* 21* 26*  CREATININE 1.19* 1.02* 1.41*  CALCIUM 9.6 8.6* 8.4*   Liver Function Tests:  Recent Labs Lab 08/19/15 2210  AST 59*  ALT 59*  ALKPHOS 181*  BILITOT  0.9  PROT 7.8  ALBUMIN 3.5    Recent Labs Lab 08/19/15 2210  LIPASE 50   No results for input(s): AMMONIA in the last 168 hours. CBC:  Recent Labs Lab 08/19/15 2210 08/20/15 0222 08/20/15 0510 08/21/15 0547  WBC 14.4* 10.7* 14.3* 13.0*  NEUTROABS  --  8.7*  --   --   HGB 12.4 13.5 12.1 12.1  HCT 36.3 38.8 34.9*  36.0  MCV 88.1 87.8 86.6 89.1  PLT 342 302 257 259   Cardiac Enzymes: No results for input(s): CKTOTAL, CKMB, CKMBINDEX, TROPONINI in the last 168 hours. BNP (last 3 results)  Recent Labs  05/15/15 1115 06/15/15 0955  BNP 111.4* 100.1*    ProBNP (last 3 results) No results for input(s): PROBNP in the last 8760 hours.  CBG: No results for input(s): GLUCAP in the last 168 hours.  No results found for this or any previous visit (from the past 240 hour(s)).   Studies: Ct Abdomen Pelvis W Contrast  08/20/2015  CLINICAL DATA:  Acute onset of nausea, vomiting and fever. Recent liver biopsy on August 10, 2015. Initial encounter. EXAM: CT ABDOMEN AND PELVIS WITH CONTRAST TECHNIQUE: Multidetector CT imaging of the abdomen and pelvis was performed using the standard protocol following bolus administration of intravenous contrast. CONTRAST:  36mL OMNIPAQUE IOHEXOL 300 MG/ML  SOLN COMPARISON:  CT of the chest, abdomen and pelvis performed 08/08/2015, and MRI of the abdomen performed 08/03/2015 FINDINGS: A trace right-sided pleural fluid is noted, new from the prior study, possibly reactive in nature. Scattered coronary artery calcifications are noted. The known multiple metastatic lesions within the liver are better characterized on prior CT; associated perfusion anomalies are not well assessed given the phase of contrast enhancement. These appear to be relatively stable in size. A large mass is again noted occupying much of the right kidney, with mild sparing of the lower pole of the right kidney. The degree of infiltration of the retroperitoneum anterior to the lumbar spine appears mildly worsened, and the mass thus measures approximately 10.5 x 6.4 x 9.2 cm. Per correlation with recent biopsy results, this most likely reflects urothelial carcinoma. Surrounding soft tissue inflammation and trace fluid is seen. There is new vague fluid attenuation tracking adjacent to the antrum of the stomach and  hepatic flexure of the colon, measuring approximately 7.0 x 5.7 x 5.2 cm, which still contains several foci of fat. Given the patient's leukocytosis, mild apparent peripheral enhancement, and the relatively low attenuation of this collection, this is concerning for evolving abscess infiltrating the colonic mesentery. It is likely not sufficiently organized to be fully drainable at this time. There is adjacent wall thickening involving the hepatic flexure of the colon, and surrounding soft tissue inflammation is noted about the omentum. The patient is status post cholecystectomy. Prominence of the common bile duct and intrahepatic biliary ducts is relatively stable from the prior study. The spleen is unremarkable in appearance. The pancreas and adrenal glands are grossly unremarkable, though mass infiltrates about portions of the right adrenal gland. The left kidney is unremarkable in appearance. On the left side, there is no evidence of hydronephrosis. No renal or ureteral stones are seen. The small bowel is grossly unremarkable. The duodenum is decompressed and passes near the collection of fluid. The stomach is largely distended with contrast and air, and is grossly unremarkable. No acute vascular abnormalities are seen. There is scattered calcification along the abdominal aorta and its branches. The patient is status post appendectomy. Scattered diverticulosis  is noted along the sigmoid colon, without evidence of diverticulitis. There is unusual prominence of varices within the retroperitoneum and pelvis. The bladder is mildly distended and grossly unremarkable. The patient is status post hysterectomy. No suspicious adnexal masses are seen. No inguinal lymphadenopathy is seen. No acute osseous abnormalities are identified. Multilevel vacuum phenomenon is noted along the lumbar spine, with mild grade 1 anterolisthesis of L4 on L5, reflecting underlying facet disease. IMPRESSION: 1. New vague fluid collection  tracking adjacent to the antrum of the stomach and hepatic flexure of the colon, measuring 7.0 x 5.7 x 5.2 cm, which still contains several foci of fat. Given the patient's leukocytosis, mild apparent peripheral enhancement, and the relatively low attenuation of this collection, this is concerning for evolving abscess infiltrating the colonic mesentery. It is likely not sufficiently organized to be fully drainable at this time. 2. Adjacent associated wall thickening involving the hepatic flexure of the colon, and surrounding soft tissue inflammation about the omentum. 3. Trace right-sided pleural fluid, new from the prior study, possibly reactive in nature. 4. Large mass again noted occupying much of the right kidney, with mild sparing of the lower pole of the right kidney. There appears to be mildly worsened infiltration of the retroperitoneum anterior to the lumbar spine, and partial infiltration about the right adrenal gland; the mass thus measures approximately 10.5 x 6.4 x 9.2 cm. Per correlation with recent biopsy results, this most likely reflects urothelial carcinoma. 5. Known multiple metastatic lesions within the liver are better characterized on prior CT, with associated perfusion anomalies, not well assessed on this study due to the phase of contrast enhancement. Metastatic lesions appear relatively stable from the recent prior CT. 6. Scattered coronary artery calcifications seen. 7. Scattered calcification along the abdominal aorta and its branches. 8. Scattered diverticulosis along the sigmoid colon, without evidence of diverticulitis. 9. Unusual prominence of varices within the retroperitoneum and pelvis. 10. Mild degenerative change noted along the lumbar spine. These results were called by telephone at the time of interpretation on 08/20/2015 at 2:32 am to Dr. Veatrice Kells, who verbally acknowledged these results. Electronically Signed   By: Garald Balding M.D.   On: 08/20/2015 02:34   Dg Chest  Port 1 View  08/20/2015  CLINICAL DATA:  Acute onset of nausea.  Sepsis.  Initial encounter. EXAM: PORTABLE CHEST 1 VIEW COMPARISON:  Chest radiograph performed 01/22/2010, and CT of the chest performed 08/08/2015 FINDINGS: The lungs are well-aerated. Vascular congestion is noted. Mild bibasilar opacities may reflect mild interstitial edema or possibly pneumonia. There is no evidence of pleural effusion or pneumothorax. The cardiomediastinal silhouette is borderline normal in size. The patient is status post median sternotomy. A valve replacement is noted. No acute osseous abnormalities are seen. IMPRESSION: Vascular congestion noted. Mild bibasilar opacities may reflect mild interstitial edema or possibly pneumonia. Electronically Signed   By: Garald Balding M.D.   On: 08/20/2015 05:38    Scheduled Meds: . antiseptic oral rinse  7 mL Mouth Rinse BID  . ondansetron (ZOFRAN) IV  4 mg Intravenous 4 times per day  . pantoprazole (PROTONIX) IV  40 mg Intravenous Q24H  . piperacillin-tazobactam (ZOSYN)  IV  3.375 g Intravenous Q8H  . sodium chloride  1,000 mL Intravenous Once  . sodium chloride  3 mL Intravenous Q12H  . vancomycin  1,000 mg Intravenous Q24H   Continuous Infusions: . sodium chloride 75 mL/hr at 08/21/15 0945      Time spent: 25 minutes  Louellen Molder  [facility] Pager [phone]. If 7PM-7AM, please contact night-coverage at [email or web address], password [facility] 08/21/2015, 1:55 PM  LOS: 1 day

## 2015-08-21 NOTE — Progress Notes (Signed)
IP PROGRESS NOTE  Subjective:    she was admitted 08/20/2015 with nausea/vomiting and fever. She started pazopanib on 08/17/2015. Kim Donovan reports feeling better today.  Objective: Vital signs in last 24 hours: Blood pressure 133/79, pulse 89, temperature 98.3 F (36.8 C), temperature source Oral, resp. rate 20, height 5\' 6"  (1.676 m), weight 136 lb 0.4 oz (61.7 kg), SpO2 98 %.  Intake/Output from previous day: 01/02 0701 - 01/03 0700 In: 91.5 [I.V.:41.5; IV Piggyback:50] Out: 1250 [Urine:1250]  Physical Exam:  Lungs:  Clear bilaterally Cardiac:  Regular rate and rhythm Abdomen:  No hepatomegaly, no mass, nontender Extremities:  No leg edema     Lab Results:  Recent Labs  08/20/15 0510 08/21/15 0547  WBC 14.3* 13.0*  HGB 12.1 12.1  HCT 34.9* 36.0  PLT 257 259    BMET  Recent Labs  08/20/15 0510 08/21/15 0547  NA 130* 130*  K 3.6 3.3*  CL 95* 95*  CO2 25 26  GLUCOSE 95 107*  BUN 21* 26*  CREATININE 1.02* 1.41*  CALCIUM 8.6* 8.4*    Studies/Results: Ct Abdomen Pelvis W Contrast  08/20/2015  CLINICAL DATA:  Acute onset of nausea, vomiting and fever. Recent liver biopsy on August 10, 2015. Initial encounter. EXAM: CT ABDOMEN AND PELVIS WITH CONTRAST TECHNIQUE: Multidetector CT imaging of the abdomen and pelvis was performed using the standard protocol following bolus administration of intravenous contrast. CONTRAST:  62mL OMNIPAQUE IOHEXOL 300 MG/ML  SOLN COMPARISON:  CT of the chest, abdomen and pelvis performed 08/08/2015, and MRI of the abdomen performed 08/03/2015 FINDINGS: A trace right-sided pleural fluid is noted, new from the prior study, possibly reactive in nature. Scattered coronary artery calcifications are noted. The known multiple metastatic lesions within the liver are better characterized on prior CT; associated perfusion anomalies are not well assessed given the phase of contrast enhancement. These appear to be relatively stable in size. A large  mass is again noted occupying much of the right kidney, with mild sparing of the lower pole of the right kidney. The degree of infiltration of the retroperitoneum anterior to the lumbar spine appears mildly worsened, and the mass thus measures approximately 10.5 x 6.4 x 9.2 cm. Per correlation with recent biopsy results, this most likely reflects urothelial carcinoma. Surrounding soft tissue inflammation and trace fluid is seen. There is new vague fluid attenuation tracking adjacent to the antrum of the stomach and hepatic flexure of the colon, measuring approximately 7.0 x 5.7 x 5.2 cm, which still contains several foci of fat. Given the patient's leukocytosis, mild apparent peripheral enhancement, and the relatively low attenuation of this collection, this is concerning for evolving abscess infiltrating the colonic mesentery. It is likely not sufficiently organized to be fully drainable at this time. There is adjacent wall thickening involving the hepatic flexure of the colon, and surrounding soft tissue inflammation is noted about the omentum. The patient is status post cholecystectomy. Prominence of the common bile duct and intrahepatic biliary ducts is relatively stable from the prior study. The spleen is unremarkable in appearance. The pancreas and adrenal glands are grossly unremarkable, though mass infiltrates about portions of the right adrenal gland. The left kidney is unremarkable in appearance. On the left side, there is no evidence of hydronephrosis. No renal or ureteral stones are seen. The small bowel is grossly unremarkable. The duodenum is decompressed and passes near the collection of fluid. The stomach is largely distended with contrast and air, and is grossly unremarkable. No acute vascular  abnormalities are seen. There is scattered calcification along the abdominal aorta and its branches. The patient is status post appendectomy. Scattered diverticulosis is noted along the sigmoid colon, without  evidence of diverticulitis. There is unusual prominence of varices within the retroperitoneum and pelvis. The bladder is mildly distended and grossly unremarkable. The patient is status post hysterectomy. No suspicious adnexal masses are seen. No inguinal lymphadenopathy is seen. No acute osseous abnormalities are identified. Multilevel vacuum phenomenon is noted along the lumbar spine, with mild grade 1 anterolisthesis of L4 on L5, reflecting underlying facet disease. IMPRESSION: 1. New vague fluid collection tracking adjacent to the antrum of the stomach and hepatic flexure of the colon, measuring 7.0 x 5.7 x 5.2 cm, which still contains several foci of fat. Given the patient's leukocytosis, mild apparent peripheral enhancement, and the relatively low attenuation of this collection, this is concerning for evolving abscess infiltrating the colonic mesentery. It is likely not sufficiently organized to be fully drainable at this time. 2. Adjacent associated wall thickening involving the hepatic flexure of the colon, and surrounding soft tissue inflammation about the omentum. 3. Trace right-sided pleural fluid, new from the prior study, possibly reactive in nature. 4. Large mass again noted occupying much of the right kidney, with mild sparing of the lower pole of the right kidney. There appears to be mildly worsened infiltration of the retroperitoneum anterior to the lumbar spine, and partial infiltration about the right adrenal gland; the mass thus measures approximately 10.5 x 6.4 x 9.2 cm. Per correlation with recent biopsy results, this most likely reflects urothelial carcinoma. 5. Known multiple metastatic lesions within the liver are better characterized on prior CT, with associated perfusion anomalies, not well assessed on this study due to the phase of contrast enhancement. Metastatic lesions appear relatively stable from the recent prior CT. 6. Scattered coronary artery calcifications seen. 7. Scattered  calcification along the abdominal aorta and its branches. 8. Scattered diverticulosis along the sigmoid colon, without evidence of diverticulitis. 9. Unusual prominence of varices within the retroperitoneum and pelvis. 10. Mild degenerative change noted along the lumbar spine. These results were called by telephone at the time of interpretation on 08/20/2015 at 2:32 am to Dr. Veatrice Kells, who verbally acknowledged these results. Electronically Signed   By: Garald Balding M.D.   On: 08/20/2015 02:34   Dg Chest Port 1 View  08/20/2015  CLINICAL DATA:  Acute onset of nausea.  Sepsis.  Initial encounter. EXAM: PORTABLE CHEST 1 VIEW COMPARISON:  Chest radiograph performed 01/22/2010, and CT of the chest performed 08/08/2015 FINDINGS: The lungs are well-aerated. Vascular congestion is noted. Mild bibasilar opacities may reflect mild interstitial edema or possibly pneumonia. There is no evidence of pleural effusion or pneumothorax. The cardiomediastinal silhouette is borderline normal in size. The patient is status post median sternotomy. A valve replacement is noted. No acute osseous abnormalities are seen. IMPRESSION: Vascular congestion noted. Mild bibasilar opacities may reflect mild interstitial edema or possibly pneumonia. Electronically Signed   By: Garald Balding M.D.   On: 08/20/2015 05:38    Medications: I have reviewed the patient's current medications.  Assessment/Plan: 1. Right renal mass  MRI 08/03/2015 with multiple suspicious liver lesions, replacement of the right kidney consistent with an infiltrative neoplasm, probable rectoperineal adenopathy, extension of tumor into the IVC, small right pleural effusion  Staging CTs 08/08/2015 with no lung metastases, infiltrative right renal mass extending into the right renal vein and pararenal space, no lymphadenopathy, liver metastases  Ultrasound-guided biopsy of  a right liver lesion 08/10/2015 with the preliminary pathology consistent with  metastatic renal cell carcinoma   2. Right abdomen/flank discomfort secondary to #1  3. Family history of renal cell cancer  4. Anorexia/weight loss secondary to #1  5. Status post aortic valve replacement  6.Polymyalgia rheumatica- Embrel on hold  7.    Admission 08/20/2015 with nausea/vomiting and fever - CT abdomen/pelvis revealed new fluid attenuation surrounding the gastric antrum and hepatic flexure   Kim Donovan was admitted with nausea/vomiting and a fever. Her symptoms may be related to the primary right renal tumor or an abdominal infection. She  reported episodes of nausea/vomiting over the few weeks prior to being diagnosed with renal cell carcinoma.   recommendations: 1.  Continue antibiotics and follow-up cultures 2.   Consider discharge on  a course of oral antibiotics if the cultures remain negative over the next one to 2 days 3.    Resume pazopanib at discharge 4.    Follow-up as scheduled at the [facility]    LOS: 1 day   Speed  08/21/2015, 2:17 PM

## 2015-08-21 NOTE — Progress Notes (Signed)
Pharmacy Antibiotic Follow-up Note  Kim Donovan is a 82 y.o. year-old female admitted on 08/19/2015.  The patient is currently on day #2 of vancomycin and zosyn for suspected intra-abdominal abscess.  - afeb, WBC elevated, SCr up 1.41 (crcl~27)  Assessment/Plan: - continue vancomycin 1gm IV q24h and zosyn 3.375 gm IV q8h (infuse over 4 hours) - with changing renal function, will check vancomycin trough level on 08/22/15 to r/o accumulation.  Temp (24hrs), Avg:98.4 F (36.9 C), Min:98.1 F (36.7 C), Max:98.7 F (37.1 C)   Recent Labs Lab 08/19/15 2210 08/20/15 0222 08/20/15 0510 08/21/15 0547  WBC 14.4* 10.7* 14.3* 13.0*    Recent Labs Lab 08/19/15 2210 08/20/15 0510 08/21/15 0547  CREATININE 1.19* 1.02* 1.41*   Estimated Creatinine Clearance: 27.8 mL/min (by C-G formula based on Cr of 1.41).    Allergies  Allergen Reactions  . Tramadol Rash    After surgery pt  broke out into a rash.Pt stop taking.    Antimicrobials this admission: 1/2 >>vanc >> 1/2 >>zosyn >>  Microbiology results: 1/2 bcx x2: pending  Thank you for allowing pharmacy to be a part of this patient's care.  Lynelle Doctor PharmD 08/21/2015 1:37 PM

## 2015-08-21 NOTE — Progress Notes (Signed)
Initial Nutrition Assessment  DOCUMENTATION CODES:   Severe malnutrition in context of chronic illness  INTERVENTION:  -Diet advancement per MD -RD to continue to monitor for needs -Boost Plus TID, each supplement provides 360 calories and 14 grams of protein   NUTRITION DIAGNOSIS:   Malnutrition related to catabolic illness, cancer and cancer related treatments as evidenced by percent weight loss, severe depletion of muscle mass, severe depletion of body fat.  GOAL:   Patient will meet greater than or equal to 90% of their needs  MONITOR:   PO intake, I & O's, Labs, Diet advancement, Supplement acceptance  REASON FOR ASSESSMENT:   Malnutrition Screening Tool    ASSESSMENT:   Kim Donovan is a 82 y.o. female with a history of HTN. PMR, Hyperlipidemia, and Glaucoma, with recent biopsy of a Large Right Renal Mass and a Hepatic lesion on 08/10/2015 who presents to the ED with complaints of Nausea and Vomiting and Fever and Chills for the past 2 days. Her temperature was 101 at home. She has had decreased appetite and poor intake of foods and liquids. She has had some ABD pain. A CT scan of the ABD/Pelvis was performed and revealed a Large Right Kidney Mass And Multiple Metastatic Lesions in the Liver which were known, and a fluid collection extending from the antrum of the stomach to the Hepatic Flexure measuring 7.0 x 5.7 x 5.2 cm In the ED, a Sepsis workup was initiated and she was placed on IV Vancomycin and Zosyn  Spoke with pt at bedside. Pt reports poor appetite during recent illness, prior to which she stated she was eating "fairly well." Admits that recently she was "afraid to eat" because of vomiting.  Pt reports weight loss in the past couple weeks though she is unsure of how much. Per chart, pt has had a 16#/10.5% severe wt loss in 3 months. Nutrition-Focused physical exam completed. Findings are severe fat depletion, severe muscle depletion, and moderate edema.    She reports some nausea still, no longer vomiting, but wants to slowly progress to solid food. Current diet order is full liquids.  Labs: CBGs 113-135, Na 130, K 3.3, Cl 95, BUN 26, Cr 1.41, Mg 2.6 Medications reviewed.  Diet Order:  Diet full liquid Room service appropriate?: Yes; Fluid consistency:: Thin  Skin:  Reviewed, no issues  Last BM:  1/3  Height:   Ht Readings from Last 1 Encounters:  08/20/15 5\' 6"  (1.676 m)    Weight:   Wt Readings from Last 1 Encounters:  08/20/15 136 lb 0.4 oz (61.7 kg)    Ideal Body Weight:  59.09 kg  BMI:  Body mass index is 21.97 kg/(m^2).  Estimated Nutritional Needs:   Kcal:  1800-2100 calories  Protein:  60-75 grams  Fluid:  >/= 1.8L  EDUCATION NEEDS:   Education needs addressed  Satira Anis. Marv Alfrey, MS, RD LDN After Hours/Weekend Pager [phone]

## 2015-08-21 NOTE — Progress Notes (Signed)
Patient ID: Kim Donovan, female   DOB: [birth year], 82 y.o.   MRN: [id number] [facility] Surgery Progress Note:   * No surgery found *  Subjective: Mental status is clear. Objective: Vital signs in last 24 hours: Temp:  [98.1 F (36.7 C)-98.7 F (37.1 C)] 98.3 F (36.8 C) (01/03 0536) Pulse Rate:  [82-93] 89 (01/03 0536) Resp:  [18-22] 20 (01/03 0536) BP: (124-144)/(72-85) 133/79 mmHg (01/03 0536) SpO2:  [98 %-100 %] 98 % (01/03 0536)  Intake/Output from previous day: 01/02 0701 - 01/03 0700 In: 91.5 [I.V.:41.5; IV Piggyback:50] Out: 1250 [Urine:1250] Intake/Output this shift: Total I/O In: 360 [P.O.:360] Out: 200 [Urine:200]  Physical Exam: Work of breathing is not labored.  Has begun clear liquids and will see how these are tolerated.    Lab Results:  Results for orders placed or performed during the hospital encounter of 08/19/15 (from the past 48 hour(s))  Lipase, blood     Status: None   Collection Time: 08/19/15 10:10 PM  Result Value Ref Range   Lipase 50 11 - 51 U/L  Comprehensive metabolic panel     Status: Abnormal   Collection Time: 08/19/15 10:10 PM  Result Value Ref Range   Sodium 130 (L) 135 - 145 mmol/L   Potassium 4.1 3.5 - 5.1 mmol/L   Chloride 91 (L) 101 - 111 mmol/L   CO2 27 22 - 32 mmol/L   Glucose, Bld 107 (H) 65 - 99 mg/dL   BUN 24 (H) 6 - 20 mg/dL   Creatinine, Ser 1.19 (H) 0.44 - 1.00 mg/dL   Calcium 9.6 8.9 - 10.3 mg/dL   Total Protein 7.8 6.5 - 8.1 g/dL   Albumin 3.5 3.5 - 5.0 g/dL   AST 59 (H) 15 - 41 U/L   ALT 59 (H) 14 - 54 U/L   Alkaline Phosphatase 181 (H) 38 - 126 U/L   Total Bilirubin 0.9 0.3 - 1.2 mg/dL   GFR calc non Af Amer 41 (L) >60 mL/min   GFR calc Af Amer 47 (L) >60 mL/min    Comment: (NOTE) The eGFR has been calculated using the CKD EPI equation. This calculation has not been validated in all clinical situations. eGFR's persistently <60 mL/min signify possible Chronic Kidney Disease.    Anion gap 12 5 - 15  CBC      Status: Abnormal   Collection Time: 08/19/15 10:10 PM  Result Value Ref Range   WBC 14.4 (H) 4.0 - 10.5 K/uL   RBC 4.12 3.87 - 5.11 MIL/uL   Hemoglobin 12.4 12.0 - 15.0 g/dL   HCT 36.3 36.0 - 46.0 %   MCV 88.1 78.0 - 100.0 fL   MCH 30.1 26.0 - 34.0 pg   MCHC 34.2 30.0 - 36.0 g/dL   RDW 13.3 11.5 - 15.5 %   Platelets 342 150 - 400 K/uL  I-stat troponin, ED     Status: None   Collection Time: 08/20/15 12:15 AM  Result Value Ref Range   Troponin i, poc 0.01 0.00 - 0.08 ng/mL   Comment 3            Comment: Due to the release kinetics of cTnI, a negative result within the first hours of the onset of symptoms does not rule out myocardial infarction with certainty. If myocardial infarction is still suspected, repeat the test at appropriate intervals.   Urinalysis, Routine w reflex microscopic (not at [facility])     Status: Abnormal   Collection Time: 08/20/15 12:58  AM  Result Value Ref Range   Color, Urine YELLOW YELLOW   APPearance TURBID (A) CLEAR   Specific Gravity, Urine 1.018 1.005 - 1.030   pH 8.0 5.0 - 8.0   Glucose, UA NEGATIVE NEGATIVE mg/dL   Hgb urine dipstick LARGE (A) NEGATIVE   Bilirubin Urine NEGATIVE NEGATIVE   Ketones, ur NEGATIVE NEGATIVE mg/dL   Protein, ur 100 (A) NEGATIVE mg/dL   Nitrite NEGATIVE NEGATIVE   Leukocytes, UA NEGATIVE NEGATIVE  Urine microscopic-add on     Status: Abnormal   Collection Time: 08/20/15 12:58 AM  Result Value Ref Range   Squamous Epithelial / LPF 0-5 (A) NONE SEEN   WBC, UA 0-5 0 - 5 WBC/hpf   RBC / HPF 6-30 0 - 5 RBC/hpf   Bacteria, UA FEW (A) NONE SEEN  CBC with Differential/Platelet     Status: Abnormal   Collection Time: 08/20/15  2:22 AM  Result Value Ref Range   WBC 10.7 (H) 4.0 - 10.5 K/uL   RBC 4.42 3.87 - 5.11 MIL/uL   Hemoglobin 13.5 12.0 - 15.0 g/dL   HCT 38.8 36.0 - 46.0 %   MCV 87.8 78.0 - 100.0 fL   MCH 30.5 26.0 - 34.0 pg   MCHC 34.8 30.0 - 36.0 g/dL   RDW 13.2 11.5 - 15.5 %   Platelets 302 150 - 400 K/uL    Neutrophils Relative % 82 %   Neutro Abs 8.7 (H) 1.7 - 7.7 K/uL   Lymphocytes Relative 12 %   Lymphs Abs 1.3 0.7 - 4.0 K/uL   Monocytes Relative 6 %   Monocytes Absolute 0.7 0.1 - 1.0 K/uL   Eosinophils Relative 0 %   Eosinophils Absolute 0.0 0.0 - 0.7 K/uL   Basophils Relative 0 %   Basophils Absolute 0.0 0.0 - 0.1 K/uL  Basic metabolic panel     Status: Abnormal   Collection Time: 08/20/15  5:10 AM  Result Value Ref Range   Sodium 130 (L) 135 - 145 mmol/L   Potassium 3.6 3.5 - 5.1 mmol/L   Chloride 95 (L) 101 - 111 mmol/L   CO2 25 22 - 32 mmol/L   Glucose, Bld 95 65 - 99 mg/dL   BUN 21 (H) 6 - 20 mg/dL   Creatinine, Ser 1.02 (H) 0.44 - 1.00 mg/dL   Calcium 8.6 (L) 8.9 - 10.3 mg/dL   GFR calc non Af Amer 49 (L) >60 mL/min   GFR calc Af Amer 57 (L) >60 mL/min    Comment: (NOTE) The eGFR has been calculated using the CKD EPI equation. This calculation has not been validated in all clinical situations. eGFR's persistently <60 mL/min signify possible Chronic Kidney Disease.    Anion gap 10 5 - 15  CBC     Status: Abnormal   Collection Time: 08/20/15  5:10 AM  Result Value Ref Range   WBC 14.3 (H) 4.0 - 10.5 K/uL   RBC 4.03 3.87 - 5.11 MIL/uL   Hemoglobin 12.1 12.0 - 15.0 g/dL   HCT 34.9 (L) 36.0 - 46.0 %   MCV 86.6 78.0 - 100.0 fL   MCH 30.0 26.0 - 34.0 pg   MCHC 34.7 30.0 - 36.0 g/dL   RDW 13.2 11.5 - 15.5 %   Platelets 257 150 - 400 K/uL  Lactic acid, plasma     Status: None   Collection Time: 08/20/15  5:10 AM  Result Value Ref Range   Lactic Acid, Venous 0.9 0.5 - 2.0 mmol/L  Procalcitonin     Status: None   Collection Time: 08/20/15  5:10 AM  Result Value Ref Range   Procalcitonin 0.75 ng/mL    Comment:        Interpretation: PCT > 0.5 ng/mL and <= 2 ng/mL: Systemic infection (sepsis) is possible, but other conditions are known to elevate PCT as well. (NOTE)         ICU PCT Algorithm               Non ICU PCT Algorithm    ----------------------------      ------------------------------         PCT < 0.25 ng/mL                 PCT < 0.1 ng/mL     Stopping of antibiotics            Stopping of antibiotics       strongly encouraged.               strongly encouraged.    ----------------------------     ------------------------------       PCT level decrease by               PCT < 0.25 ng/mL       >= 80% from peak PCT       OR PCT 0.25 - 0.5 ng/mL          Stopping of antibiotics                                             encouraged.     Stopping of antibiotics           encouraged.    ----------------------------     ------------------------------       PCT level decrease by              PCT >= 0.25 ng/mL       < 80% from peak PCT        AND PCT >= 0.5 ng/mL             Continuing antibiotics                                              encouraged.       Continuing antibiotics            encouraged.    ----------------------------     ------------------------------     PCT level increase compared          PCT > 0.5 ng/mL         with peak PCT AND          PCT >= 0.5 ng/mL             Escalation of antibiotics                                          strongly encouraged.      Escalation of antibiotics        strongly encouraged.   Protime-INR     Status: Abnormal   Collection Time: 08/20/15  5:10 AM  Result  Value Ref Range   Prothrombin Time 15.7 (H) 11.6 - 15.2 seconds   INR 1.24 0.00 - 1.49  APTT     Status: Abnormal   Collection Time: 08/20/15  5:10 AM  Result Value Ref Range   aPTT 38 (H) 24 - 37 seconds    Comment:        IF BASELINE aPTT IS ELEVATED, SUGGEST PATIENT RISK ASSESSMENT BE USED TO DETERMINE APPROPRIATE ANTICOAGULANT THERAPY.   Lactic acid, plasma     Status: None   Collection Time: 08/20/15  8:22 AM  Result Value Ref Range   Lactic Acid, Venous 0.9 0.5 - 2.0 mmol/L  CBC     Status: Abnormal   Collection Time: 08/21/15  5:47 AM  Result Value Ref Range   WBC 13.0 (H) 4.0 - 10.5 K/uL   RBC 4.04 3.87 - 5.11  MIL/uL   Hemoglobin 12.1 12.0 - 15.0 g/dL   HCT 36.0 36.0 - 46.0 %   MCV 89.1 78.0 - 100.0 fL   MCH 30.0 26.0 - 34.0 pg   MCHC 33.6 30.0 - 36.0 g/dL   RDW 13.6 11.5 - 15.5 %   Platelets 259 150 - 400 K/uL  Basic metabolic panel     Status: Abnormal   Collection Time: 08/21/15  5:47 AM  Result Value Ref Range   Sodium 130 (L) 135 - 145 mmol/L   Potassium 3.3 (L) 3.5 - 5.1 mmol/L   Chloride 95 (L) 101 - 111 mmol/L   CO2 26 22 - 32 mmol/L   Glucose, Bld 107 (H) 65 - 99 mg/dL   BUN 26 (H) 6 - 20 mg/dL   Creatinine, Ser 1.41 (H) 0.44 - 1.00 mg/dL   Calcium 8.4 (L) 8.9 - 10.3 mg/dL   GFR calc non Af Amer 33 (L) >60 mL/min   GFR calc Af Amer 38 (L) >60 mL/min    Comment: (NOTE) The eGFR has been calculated using the CKD EPI equation. This calculation has not been validated in all clinical situations. eGFR's persistently <60 mL/min signify possible Chronic Kidney Disease.    Anion gap 9 5 - 15    Radiology/Results: Ct Abdomen Pelvis W Contrast  08/20/2015  CLINICAL DATA:  Acute onset of nausea, vomiting and fever. Recent liver biopsy on August 10, 2015. Initial encounter. EXAM: CT ABDOMEN AND PELVIS WITH CONTRAST TECHNIQUE: Multidetector CT imaging of the abdomen and pelvis was performed using the standard protocol following bolus administration of intravenous contrast. CONTRAST:  68m OMNIPAQUE IOHEXOL 300 MG/ML  SOLN COMPARISON:  CT of the chest, abdomen and pelvis performed 08/08/2015, and MRI of the abdomen performed 08/03/2015 FINDINGS: A trace right-sided pleural fluid is noted, new from the prior study, possibly reactive in nature. Scattered coronary artery calcifications are noted. The known multiple metastatic lesions within the liver are better characterized on prior CT; associated perfusion anomalies are not well assessed given the phase of contrast enhancement. These appear to be relatively stable in size. A large mass is again noted occupying much of the right kidney, with mild  sparing of the lower pole of the right kidney. The degree of infiltration of the retroperitoneum anterior to the lumbar spine appears mildly worsened, and the mass thus measures approximately 10.5 x 6.4 x 9.2 cm. Per correlation with recent biopsy results, this most likely reflects urothelial carcinoma. Surrounding soft tissue inflammation and trace fluid is seen. There is new vague fluid attenuation tracking adjacent to the antrum of the stomach and hepatic flexure of  the colon, measuring approximately 7.0 x 5.7 x 5.2 cm, which still contains several foci of fat. Given the patient's leukocytosis, mild apparent peripheral enhancement, and the relatively low attenuation of this collection, this is concerning for evolving abscess infiltrating the colonic mesentery. It is likely not sufficiently organized to be fully drainable at this time. There is adjacent wall thickening involving the hepatic flexure of the colon, and surrounding soft tissue inflammation is noted about the omentum. The patient is status post cholecystectomy. Prominence of the common bile duct and intrahepatic biliary ducts is relatively stable from the prior study. The spleen is unremarkable in appearance. The pancreas and adrenal glands are grossly unremarkable, though mass infiltrates about portions of the right adrenal gland. The left kidney is unremarkable in appearance. On the left side, there is no evidence of hydronephrosis. No renal or ureteral stones are seen. The small bowel is grossly unremarkable. The duodenum is decompressed and passes near the collection of fluid. The stomach is largely distended with contrast and air, and is grossly unremarkable. No acute vascular abnormalities are seen. There is scattered calcification along the abdominal aorta and its branches. The patient is status post appendectomy. Scattered diverticulosis is noted along the sigmoid colon, without evidence of diverticulitis. There is unusual prominence of varices  within the retroperitoneum and pelvis. The bladder is mildly distended and grossly unremarkable. The patient is status post hysterectomy. No suspicious adnexal masses are seen. No inguinal lymphadenopathy is seen. No acute osseous abnormalities are identified. Multilevel vacuum phenomenon is noted along the lumbar spine, with mild grade 1 anterolisthesis of L4 on L5, reflecting underlying facet disease. IMPRESSION: 1. New vague fluid collection tracking adjacent to the antrum of the stomach and hepatic flexure of the colon, measuring 7.0 x 5.7 x 5.2 cm, which still contains several foci of fat. Given the patient's leukocytosis, mild apparent peripheral enhancement, and the relatively low attenuation of this collection, this is concerning for evolving abscess infiltrating the colonic mesentery. It is likely not sufficiently organized to be fully drainable at this time. 2. Adjacent associated wall thickening involving the hepatic flexure of the colon, and surrounding soft tissue inflammation about the omentum. 3. Trace right-sided pleural fluid, new from the prior study, possibly reactive in nature. 4. Large mass again noted occupying much of the right kidney, with mild sparing of the lower pole of the right kidney. There appears to be mildly worsened infiltration of the retroperitoneum anterior to the lumbar spine, and partial infiltration about the right adrenal gland; the mass thus measures approximately 10.5 x 6.4 x 9.2 cm. Per correlation with recent biopsy results, this most likely reflects urothelial carcinoma. 5. Known multiple metastatic lesions within the liver are better characterized on prior CT, with associated perfusion anomalies, not well assessed on this study due to the phase of contrast enhancement. Metastatic lesions appear relatively stable from the recent prior CT. 6. Scattered coronary artery calcifications seen. 7. Scattered calcification along the abdominal aorta and its branches. 8. Scattered  diverticulosis along the sigmoid colon, without evidence of diverticulitis. 9. Unusual prominence of varices within the retroperitoneum and pelvis. 10. Mild degenerative change noted along the lumbar spine. These results were called by telephone at the time of interpretation on 08/20/2015 at 2:32 am to Dr. Veatrice Kells, who verbally acknowledged these results. Electronically Signed   By: Garald Balding M.D.   On: 08/20/2015 02:34   Dg Chest Port 1 View  08/20/2015  CLINICAL DATA:  Acute onset of nausea.  Sepsis.  Initial encounter. EXAM: PORTABLE CHEST 1 VIEW COMPARISON:  Chest radiograph performed 01/22/2010, and CT of the chest performed 08/08/2015 FINDINGS: The lungs are well-aerated. Vascular congestion is noted. Mild bibasilar opacities may reflect mild interstitial edema or possibly pneumonia. There is no evidence of pleural effusion or pneumothorax. The cardiomediastinal silhouette is borderline normal in size. The patient is status post median sternotomy. A valve replacement is noted. No acute osseous abnormalities are seen. IMPRESSION: Vascular congestion noted. Mild bibasilar opacities may reflect mild interstitial edema or possibly pneumonia. Electronically Signed   By: Garald Balding M.D.   On: 08/20/2015 05:38    Anti-infectives: Anti-infectives    Start     Dose/Rate Route Frequency Ordered Stop   08/21/15 0500  vancomycin (VANCOCIN) IVPB 1000 mg/200 mL premix     1,000 mg 200 mL/hr over 60 Minutes Intravenous Every 24 hours 08/20/15 0442     08/20/15 1200  piperacillin-tazobactam (ZOSYN) IVPB 3.375 g     3.375 g 12.5 mL/hr over 240 Minutes Intravenous Every 8 hours 08/20/15 0442     08/20/15 0245  vancomycin (VANCOCIN) IVPB 1000 mg/200 mL premix     1,000 mg 200 mL/hr over 60 Minutes Intravenous  Once 08/20/15 0244 08/20/15 0616   08/20/15 0245  piperacillin-tazobactam (ZOSYN) IVPB 3.375 g     3.375 g 100 mL/hr over 30 Minutes Intravenous  Once 08/20/15 0244 08/20/15 0416       Assessment/Plan: Problem List: Patient Active Problem List   Diagnosis Date Noted  . Sepsis ([place]) 08/20/2015  . AKI (acute kidney injury) ([place]) 08/20/2015  . Nausea and vomiting 08/20/2015  . Hyponatremia 08/20/2015  . Essential hypertension 08/20/2015  . Intraabdominal fluid collection 08/20/2015  . Hepatic metastases ([place])   . Renal mass   . Liver masses   . Renal mass, right 08/06/2015  . Chronic diastolic CHF (congestive heart failure) ([place]) 06/15/2015  . S/P AVR (aortic valve replacement) 05/04/2014  . Increased urinary frequency 06/09/2011  . Fatigue 06/09/2011  . Polymyalgia rheumatica ([place]) 11/05/2010  . Reactive depression (situational) 11/05/2010  . Aortic stenosis   . PAIN IN JOINT, SITE UNSPECIFIED 07/02/2010  . GLAUCOMA 05/29/2009  . HYPERLIPIDEMIA 05/06/2007  . HYPERTENSION 05/06/2007  . VENOUS INSUFFICIENCY 05/06/2007  . OSTEOARTHRITIS 05/06/2007  . Osteoporosis, unspecified 04/30/2007    Bleed after biopsy;  Begun on clears and will assess progress.   * No surgery found *    LOS: 1 day   Matt B. Hassell Done, MD, [facility] [phone] beeper [phone]  08/21/2015 12:37 PM

## 2015-08-21 NOTE — Progress Notes (Signed)
Subjective: Nausea is better, hurts to cough.  Has not been OOB so far except to bedside commode.    Objective: Vital signs in last 24 hours: Temp:  [98.1 F (36.7 C)-98.7 F (37.1 C)] 98.3 F (36.8 C) (01/03 0536) Pulse Rate:  [82-93] 89 (01/03 0536) Resp:  [18-22] 20 (01/03 0536) BP: (124-144)/(72-85) 133/79 mmHg (01/03 0536) SpO2:  [98 %-100 %] 98 % (01/03 0536) Last BM Date: 08/19/15 360 PO recorded today, none recorded yesterday 1250 urine yesterday.  Afebrile, VSS Na 130/K+ 3.3/creatinine is up to 1.41 WBC 13K   CXR yesterday shows:Vascular congestion noted. Mild bibasilar opacities may reflect mild interstitial edema or possibly pneumonia. CT scan 08/20/15: 1. New vague fluid collection tracking adjacent to the antrum of the stomach and hepatic flexure of the colon, measuring 7.0 x 5.7 x 5.2 cm, which still contains several foci of fat. Given the patient's leukocytosis, mild apparent peripheral enhancement, and the relatively low attenuation of this collection, this is concerning for evolving abscess infiltrating the colonic mesentery. It is likely not sufficiently organized to be fully drainable at this time. 2. Adjacent associated wall thickening involving the hepatic flexure of the colon, and surrounding soft tissue inflammation about the omentum. 3. Trace right-sided pleural fluid, new from the prior study, possibly reactive in nature. 4. Large mass again noted occupying much of the right kidney, with mild sparing of the lower pole of the right kidney. There appears to be mildly worsened infiltration of the retroperitoneum anterior to the lumbar spine, and partial infiltration about the right adrenal gland; the mass thus measures approximately 10.5 x 6.4 x 9.2 cm. Per correlation with recent biopsy results, this most likely reflects urothelial carcinoma. 5. Known multiple metastatic lesions within the liver are better characterized on prior CT, with associated  perfusion anomalies, not well assessed on this study due to the phase of contrast enhancement. Metastatic lesions appear relatively stable from the recent prior CT. 6. Scattered coronary artery calcifications seen. 7. Scattered calcification along the abdominal aorta and its branches. 8. Scattered diverticulosis along the sigmoid colon, without evidence of diverticulitis. 9. Unusual prominence of varices within the retroperitoneum and pelvis. 10. Mild degenerative change noted along the lumbar spine.  Intake/Output from previous day: 01/02 0701 - 01/03 0700 In: 91.5 [I.V.:41.5; IV Piggyback:50] Out: 1250 [Urine:1250] Intake/Output this shift: Total I/O In: 360 [P.O.:360] Out: 200 [Urine:200]  General appearance: alert, cooperative, no distress and frail and tired, looks like she feels bad. GI: soft, still sore on palpation, no BS, no BM, no flatus  Lab Results:   Recent Labs  08/20/15 0510 08/21/15 0547  WBC 14.3* 13.0*  HGB 12.1 12.1  HCT 34.9* 36.0  PLT 257 259    BMET  Recent Labs  08/20/15 0510 08/21/15 0547  NA 130* 130*  K 3.6 3.3*  CL 95* 95*  CO2 25 26  GLUCOSE 95 107*  BUN 21* 26*  CREATININE 1.02* 1.41*  CALCIUM 8.6* 8.4*   PT/INR  Recent Labs  08/20/15 0510  LABPROT 15.7*  INR 1.24     Recent Labs Lab 08/19/15 2210  AST 59*  ALT 59*  ALKPHOS 181*  BILITOT 0.9  PROT 7.8  ALBUMIN 3.5     Lipase     Component Value Date/Time   LIPASE 50 08/19/2015 2210     Studies/Results: Ct Abdomen Pelvis W Contrast  08/20/2015  CLINICAL DATA:  Acute onset of nausea, vomiting and fever. Recent liver biopsy on August 10, 2015. Initial  encounter. EXAM: CT ABDOMEN AND PELVIS WITH CONTRAST TECHNIQUE: Multidetector CT imaging of the abdomen and pelvis was performed using the standard protocol following bolus administration of intravenous contrast. CONTRAST:  24mL OMNIPAQUE IOHEXOL 300 MG/ML  SOLN COMPARISON:  CT of the chest, abdomen and pelvis  performed 08/08/2015, and MRI of the abdomen performed 08/03/2015 FINDINGS: A trace right-sided pleural fluid is noted, new from the prior study, possibly reactive in nature. Scattered coronary artery calcifications are noted. The known multiple metastatic lesions within the liver are better characterized on prior CT; associated perfusion anomalies are not well assessed given the phase of contrast enhancement. These appear to be relatively stable in size. A large mass is again noted occupying much of the right kidney, with mild sparing of the lower pole of the right kidney. The degree of infiltration of the retroperitoneum anterior to the lumbar spine appears mildly worsened, and the mass thus measures approximately 10.5 x 6.4 x 9.2 cm. Per correlation with recent biopsy results, this most likely reflects urothelial carcinoma. Surrounding soft tissue inflammation and trace fluid is seen. There is new vague fluid attenuation tracking adjacent to the antrum of the stomach and hepatic flexure of the colon, measuring approximately 7.0 x 5.7 x 5.2 cm, which still contains several foci of fat. Given the patient's leukocytosis, mild apparent peripheral enhancement, and the relatively low attenuation of this collection, this is concerning for evolving abscess infiltrating the colonic mesentery. It is likely not sufficiently organized to be fully drainable at this time. There is adjacent wall thickening involving the hepatic flexure of the colon, and surrounding soft tissue inflammation is noted about the omentum. The patient is status post cholecystectomy. Prominence of the common bile duct and intrahepatic biliary ducts is relatively stable from the prior study. The spleen is unremarkable in appearance. The pancreas and adrenal glands are grossly unremarkable, though mass infiltrates about portions of the right adrenal gland. The left kidney is unremarkable in appearance. On the left side, there is no evidence of  hydronephrosis. No renal or ureteral stones are seen. The small bowel is grossly unremarkable. The duodenum is decompressed and passes near the collection of fluid. The stomach is largely distended with contrast and air, and is grossly unremarkable. No acute vascular abnormalities are seen. There is scattered calcification along the abdominal aorta and its branches. The patient is status post appendectomy. Scattered diverticulosis is noted along the sigmoid colon, without evidence of diverticulitis. There is unusual prominence of varices within the retroperitoneum and pelvis. The bladder is mildly distended and grossly unremarkable. The patient is status post hysterectomy. No suspicious adnexal masses are seen. No inguinal lymphadenopathy is seen. No acute osseous abnormalities are identified. Multilevel vacuum phenomenon is noted along the lumbar spine, with mild grade 1 anterolisthesis of L4 on L5, reflecting underlying facet disease. IMPRESSION: 1. New vague fluid collection tracking adjacent to the antrum of the stomach and hepatic flexure of the colon, measuring 7.0 x 5.7 x 5.2 cm, which still contains several foci of fat. Given the patient's leukocytosis, mild apparent peripheral enhancement, and the relatively low attenuation of this collection, this is concerning for evolving abscess infiltrating the colonic mesentery. It is likely not sufficiently organized to be fully drainable at this time. 2. Adjacent associated wall thickening involving the hepatic flexure of the colon, and surrounding soft tissue inflammation about the omentum. 3. Trace right-sided pleural fluid, new from the prior study, possibly reactive in nature. 4. Large mass again noted occupying much of the right  kidney, with mild sparing of the lower pole of the right kidney. There appears to be mildly worsened infiltration of the retroperitoneum anterior to the lumbar spine, and partial infiltration about the right adrenal gland; the mass thus  measures approximately 10.5 x 6.4 x 9.2 cm. Per correlation with recent biopsy results, this most likely reflects urothelial carcinoma. 5. Known multiple metastatic lesions within the liver are better characterized on prior CT, with associated perfusion anomalies, not well assessed on this study due to the phase of contrast enhancement. Metastatic lesions appear relatively stable from the recent prior CT. 6. Scattered coronary artery calcifications seen. 7. Scattered calcification along the abdominal aorta and its branches. 8. Scattered diverticulosis along the sigmoid colon, without evidence of diverticulitis. 9. Unusual prominence of varices within the retroperitoneum and pelvis. 10. Mild degenerative change noted along the lumbar spine. These results were called by telephone at the time of interpretation on 08/20/2015 at 2:32 am to Dr. Veatrice Kells, who verbally acknowledged these results. Electronically Signed   By: Garald Balding M.D.   On: 08/20/2015 02:34   Dg Chest Port 1 View  08/20/2015  CLINICAL DATA:  Acute onset of nausea.  Sepsis.  Initial encounter. EXAM: PORTABLE CHEST 1 VIEW COMPARISON:  Chest radiograph performed 01/22/2010, and CT of the chest performed 08/08/2015 FINDINGS: The lungs are well-aerated. Vascular congestion is noted. Mild bibasilar opacities may reflect mild interstitial edema or possibly pneumonia. There is no evidence of pleural effusion or pneumothorax. The cardiomediastinal silhouette is borderline normal in size. The patient is status post median sternotomy. A valve replacement is noted. No acute osseous abnormalities are seen. IMPRESSION: Vascular congestion noted. Mild bibasilar opacities may reflect mild interstitial edema or possibly pneumonia. Electronically Signed   By: Garald Balding M.D.   On: 08/20/2015 05:38    Medications: . antiseptic oral rinse  7 mL Mouth Rinse BID  . ondansetron (ZOFRAN) IV  4 mg Intravenous 4 times per day  . pantoprazole (PROTONIX) IV  40  mg Intravenous Q24H  . piperacillin-tazobactam (ZOSYN)  IV  3.375 g Intravenous Q8H  . sodium chloride  1,000 mL Intravenous Once  . sodium chloride  3 mL Intravenous Q12H  . vancomycin  1,000 mg Intravenous Q24H   . sodium chloride 75 mL/hr at 08/21/15 0945   Prior to Admission medications   Medication Sig Start Date End Date Taking? Authorizing Provider  ALPRAZolam (XANAX) 0.25 MG tablet Take 1 tablet (0.25 mg total) by mouth at bedtime as needed for anxiety. 08/06/15  Yes Ladell Pier, MD  aspirin 81 MG tablet Take 81 mg by mouth daily.     Yes Historical Provider, MD  Calcium Citrate-Vitamin D 250-200 MG-UNIT TABS Take 2 tablets by mouth daily.    Yes Historical Provider, MD  Diphenhydramine-APAP, sleep, (TYLENOL PM EXTRA STRENGTH PO) Take 1 tablet by mouth at bedtime as needed (pain/sleep).    Yes Historical Provider, MD  dorzolamide-timolol (COSOPT) 22.3-6.8 MG/ML ophthalmic solution Place 1 drop into the left eye 2 (two) times daily.    Yes Historical Provider, MD  furosemide (LASIX) 20 MG tablet Take 1 tablet (20 mg total) by mouth daily. 05/15/15  Yes Larey Dresser, MD  Multiple Vitamins-Minerals (CENTRUM SILVER ULTRA WOMENS PO) Take 1 tablet by mouth daily.     Yes Historical Provider, MD  Omega-3 Fatty Acids (FISH OIL) 1000 MG CAPS Take 1 capsule by mouth daily. Reported on 08/06/2015   Yes Historical Provider, MD  pazopanib (VOTRIENT) 200 MG  tablet Take 4 tablets (800 mg total) by mouth daily. Take on an empty stomach. 08/14/15  Yes Ladell Pier, MD  PREDNISONE, PAK, PO Take 2.5 mg by mouth every other day.    Yes Historical Provider, MD  ezetimibe (ZETIA) 10 MG tablet Take 1 tablet (10 mg total) by mouth daily. 05/12/14   Jolaine Artist, MD  potassium chloride (K-DUR) 10 MEQ tablet Take 1 tablet (10 mEq total) by mouth daily. Patient not taking: Reported on 08/14/2015 05/15/15   Larey Dresser, MD     Assessment/Plan 7.0 x 5.7 x 5.2 cm fluid collection with nausea,  vomiting, fever and chills; sepsis S/p right renal and hepatic mass 08/10/15 by IR, for metastatic renal cell carcinoma.   Acute kidney injury Hypokalemia Hx of polymyalgia rheumatica Hx of hypertension Hx of PVD/aortic stenosis Dyslipidemia   Antibiotics:  Day 3 Zosyn/Vancomycin DVT:  SCD  Plan:  I would start getting her up and see how she does.  CT scan on 08/20/15. Her K+ is down and her creatinine is rising, will defer to Medicine on this, but we would like to see her K+ up to 4.0.   I will ask PT to see and help with ambulation, we will follow with you.      LOS: 1 day    JENNINGS,WILLARD 08/21/2015  I have seen and examined this patient and agree with the assessment and plan.   Matt B. Hassell Done, MD, [facility] [phone] beeper [phone]  08/21/2015 12:50 PM

## 2015-08-22 ENCOUNTER — Inpatient Hospital Stay (HOSPITAL_COMMUNITY): Payer: Medicare Other

## 2015-08-22 DIAGNOSIS — J189 Pneumonia, unspecified organism: Secondary | ICD-10-CM | POA: Diagnosis present

## 2015-08-22 LAB — BASIC METABOLIC PANEL
ANION GAP: 11 (ref 5–15)
BUN: 25 mg/dL — AB (ref 6–20)
CO2: 26 mmol/L (ref 22–32)
Calcium: 8.1 mg/dL — ABNORMAL LOW (ref 8.9–10.3)
Chloride: 98 mmol/L — ABNORMAL LOW (ref 101–111)
Creatinine, Ser: 1.46 mg/dL — ABNORMAL HIGH (ref 0.44–1.00)
GFR calc Af Amer: 37 mL/min — ABNORMAL LOW (ref >60)
GFR, EST NON AFRICAN AMERICAN: 32 mL/min — AB (ref >60)
GLUCOSE: 91 mg/dL (ref 65–99)
POTASSIUM: 3.9 mmol/L (ref 3.5–5.1)
Sodium: 135 mmol/L (ref 135–145)

## 2015-08-22 LAB — CBC
HEMATOCRIT: 37 % (ref 36.0–46.0)
HEMOGLOBIN: 12.3 g/dL (ref 12.0–15.0)
MCH: 29.9 pg (ref 26.0–34.0)
MCHC: 33.2 g/dL (ref 30.0–36.0)
MCV: 89.8 fL (ref 78.0–100.0)
Platelets: 283 10*3/uL (ref 150–400)
RBC: 4.12 MIL/uL (ref 3.87–5.11)
RDW: 13.6 % (ref 11.5–15.5)
WBC: 9 10*3/uL (ref 4.0–10.5)

## 2015-08-22 LAB — HEPATIC FUNCTION PANEL
ALK PHOS: 133 U/L — AB (ref 38–126)
ALT: 38 U/L (ref 14–54)
AST: 49 U/L — ABNORMAL HIGH (ref 15–41)
Albumin: 2.6 g/dL — ABNORMAL LOW (ref 3.5–5.0)
BILIRUBIN DIRECT: 0.2 mg/dL (ref 0.1–0.5)
BILIRUBIN INDIRECT: 0.9 mg/dL (ref 0.3–0.9)
BILIRUBIN TOTAL: 1.1 mg/dL (ref 0.3–1.2)
Total Protein: 6.6 g/dL (ref 6.5–8.1)

## 2015-08-22 LAB — VANCOMYCIN, TROUGH: VANCOMYCIN TR: 10 ug/mL (ref 10.0–20.0)

## 2015-08-22 MED ORDER — DEXTROSE 5 % IV SOLN
500.0000 mg | INTRAVENOUS | Status: DC
  Administered 2015-08-22 – 2015-08-23 (×2): 500 mg via INTRAVENOUS
  Filled 2015-08-22 (×3): qty 500

## 2015-08-22 MED ORDER — KCL IN DEXTROSE-NACL 20-5-0.9 MEQ/L-%-% IV SOLN
INTRAVENOUS | Status: DC
  Administered 2015-08-22 – 2015-08-23 (×3): via INTRAVENOUS
  Administered 2015-08-24: 75 mL/h via INTRAVENOUS
  Filled 2015-08-22 (×4): qty 1000

## 2015-08-22 MED ORDER — METOCLOPRAMIDE HCL 5 MG/ML IJ SOLN
5.0000 mg | Freq: Once | INTRAMUSCULAR | Status: AC
  Administered 2015-08-22: 5 mg via INTRAVENOUS
  Filled 2015-08-22: qty 2

## 2015-08-22 MED ORDER — SODIUM CHLORIDE 0.9 % IV SOLN
INTRAVENOUS | Status: DC
  Administered 2015-08-22: 10:00:00 via INTRAVENOUS

## 2015-08-22 MED ORDER — METOCLOPRAMIDE HCL 5 MG/ML IJ SOLN
10.0000 mg | Freq: Once | INTRAMUSCULAR | Status: AC
  Administered 2015-08-22: 10 mg via INTRAVENOUS
  Filled 2015-08-22: qty 2

## 2015-08-22 NOTE — Progress Notes (Signed)
  Subjective: Bad night vomited x 6, she says her urine is very dark.  She did have a BM this AM.  No flatus.  Her abdomen is soft and not really tender on exam this AM.    Objective: Vital signs in last 24 hours: Temp:  [97.2 F (36.2 C)-98.2 F (36.8 C)] 97.2 F (36.2 C) (01/04 0511) Pulse Rate:  [88-100] 100 (01/04 0511) Resp:  [20] 20 (01/04 0511) BP: (121-139)/(80-82) 133/80 mmHg (01/04 0511) SpO2:  [93 %-98 %] 98 % (01/04 0511) Last BM Date: 08/22/15 PO 720, clears Only 400 urine recorded. Emesis x 6 recorded + BM x 1 this Am Afebrile, VSS Creatinine is still going up, WBC is normal   CT on 08/19/14 Intake/Output from previous day: 01/03 0701 - 01/04 0700 In: 2363.8 [P.O.:720; I.V.:993.8; IV Piggyback:650] Out: 600 [Urine:400; Emesis/NG output:200] Intake/Output this shift:    General appearance: alert, cooperative and no distress GI: soft, some high pitched BS, not tender or distended.  Lab Results:   Recent Labs  08/21/15 0547 08/22/15 0429  WBC 13.0* 9.0  HGB 12.1 12.3  HCT 36.0 37.0  PLT 259 283    BMET  Recent Labs  08/21/15 0547 08/22/15 0429  NA 130* 135  K 3.3* 3.9  CL 95* 98*  CO2 26 26  GLUCOSE 107* 91  BUN 26* 25*  CREATININE 1.41* 1.46*  CALCIUM 8.4* 8.1*   PT/INR  Recent Labs  08/20/15 0510  LABPROT 15.7*  INR 1.24     Recent Labs Lab 08/19/15 2210  AST 59*  ALT 59*  ALKPHOS 181*  BILITOT 0.9  PROT 7.8  ALBUMIN 3.5     Lipase     Component Value Date/Time   LIPASE 50 08/19/2015 2210     Studies/Results: No results found.  Medications: . antiseptic oral rinse  7 mL Mouth Rinse BID  . lactose free nutrition  237 mL Oral TID WC  . ondansetron (ZOFRAN) IV  4 mg Intravenous 4 times per day  . pantoprazole (PROTONIX) IV  40 mg Intravenous Q24H  . piperacillin-tazobactam (ZOSYN)  IV  3.375 g Intravenous Q8H  . sodium chloride  1,000 mL Intravenous Once  . sodium chloride  3 mL Intravenous Q12H  . vancomycin   1,000 mg Intravenous Q24H     Assessment/Plan 7.0 x 5.7 x 5.2 cm fluid collection with nausea, vomiting, fever and chills; sepsis S/p right renal and hepatic mass 08/10/15 by IR, for metastatic renal cell carcinoma.  Acute kidney injury creatinine is up to 1.46 Hypokalemia Hx of polymyalgia rheumatica Hx of hypertension Hx of PVD/aortic stenosis Dyslipidemia  Antibiotics: Day 4 Zosyn/Vancomycin DVT: SCD   Plan:  I will get a KUB just to be sure she isn't developing an ileus.  Right now I think she is a bit dry, and I am going to start some fluids for her.  Will follow but I don't see any acute surgical need.    LOS: 2 days    Kaulder Zahner 08/22/2015

## 2015-08-22 NOTE — Progress Notes (Signed)
[facility] PROGRESS NOTE  Kim Donovan [id number] DOB: [birth year] DOA: 08/19/2015 PCP: Mathews Argyle, MD  Summary 08/22/15: I have seen, examined Kim Donovan at bedside and reviewed her chart including off service note by Dr. Clementeen Graham which is incorporated verbatim in my note today. Appreciate oncology/general surgery. Murdis Donovan is a pleasant 82 year old female with history of recently diagnosed metastatic renal cell carcinoma with liver metastases and underwent right renal biopsy (following with Dr. Learta Codding). He started her on a trial of systemic chemotherapy with pazopanib. Other medical history includes hypertension, polymyalgia rheumatica, glaucoma and hyperlipidemia. Patient presented to the ED on 1/1 with several episodes of nausea with vomiting and fever and chills at home 2 days duration. She had a fever 101F long with poor appetite. A CT scan of the abdomen and pelvis done in the ED showed a large right kidney mass and multiple metastatic disease in the liver. Also showed fluid collection extending from the antrum of the stomach to the hepatic flexure of colon measuring 75.7 cm. Patient was found to be septic in the ED with fever off 100.96F, tachycardia and tachypnea and subsequent WBC of 14.3. He was hyponatremic with sodium of 130 and mild transaminitis. Lactic acid was unremarkable. [place] [facility] consulted and patient admitted to hospitalist service.... Patient also mentions cough and her chest x-ray suggested pneumonia "Vascular congestion noted. Mild bibasilar opacities may reflect mild  interstitial edema or possibly pneumonia". She is still nauseated and is afraid to eat. Her white count has improved. Noted plans to repeat CT abdomen and pelvis on 08/23/2015 or 08/24/2015. Will change fluids to D5WNS with KCl as oral intake poor, add Zithromax, and follow oncology/general surgery recommendations. Plan Sepsis (HCC)/CAP (community acquired pneumonia)  Blood cultures show  no growth  Continue Vancomycin/Zosyn  Zithromax Renal mass, right/Liver masses/AKI (acute kidney injury) (HCC)/Nausea and vomiting/Hyponatremia/Intraabdominal fluid collection/Hepatic metastases (HCC)  Change fluids to D5WNS with kcl. Give dose of Reglan  Continue Protonix  Agree with repeat CT abdomen and pelvis  Follow general surgery/oncology recommendations Essential hypertension/GLAUCOMA/Polymyalgia rheumatica (HCC)/S/P AVR (aortic valve replacement)/Chronic diastolic CHF (congestive heart failure) (HCC)  No acute changes  Continue current management Code Status: Full Code Family Communication: None at bedside Disposition Plan: Home eventually   Consultants:  General Surgery  Oncology  Procedures:  None  Antibiotics:  Vancomycin 08/20/15  Zosyn 08/20/15>  Zithromax 08/22/15>  HPI/Subjective: Patient says nausea is better. Cough is better  Objective: Filed Vitals:   08/21/15 2150 08/22/15 0511  BP: 139/82 133/80  Pulse: 100 100  Temp: 97.8 F (36.6 C) 97.2 F (36.2 C)  Resp: 20 20    Intake/Output Summary (Last 24 hours) at 08/22/15 1333 Last data filed at 08/22/15 0500  Gross per 24 hour  Intake 1643.75 ml  Output    200 ml  Net 1443.75 ml   Filed Weights   08/20/15 0431  Weight: 61.7 kg (136 lb 0.4 oz)    Exam:   General:  Comfortable at rest.  Cardiovascular: S1-S2 normal. No murmurs. Pulse regular.  Respiratory: Good air entry bilaterally. No rhonchi or rales.  Abdomen: Soft and nontender. Normal bowel sounds. No organomegaly.  Musculoskeletal: No pedal edema   Neurological: Intact  Data Reviewed: Basic Metabolic Panel:  Recent Labs Lab 08/19/15 2210 08/20/15 0510 08/21/15 0547 08/22/15 0429  NA 130* 130* 130* 135  K 4.1 3.6 3.3* 3.9  CL 91* 95* 95* 98*  CO2 27 25 26 26   GLUCOSE 107* 95 107* 91  BUN 24*  21* 26* 25*  CREATININE 1.19* 1.02* 1.41* 1.46*  CALCIUM 9.6 8.6* 8.4* 8.1*   Liver Function Tests:  Recent  Labs Lab 08/19/15 2210 08/22/15 0429  AST 59* 49*  ALT 59* 38  ALKPHOS 181* 133*  BILITOT 0.9 1.1  PROT 7.8 6.6  ALBUMIN 3.5 2.6*    Recent Labs Lab 08/19/15 2210  LIPASE 50   No results for input(s): AMMONIA in the last 168 hours. CBC:  Recent Labs Lab 08/19/15 2210 08/20/15 0222 08/20/15 0510 08/21/15 0547 08/22/15 0429  WBC 14.4* 10.7* 14.3* 13.0* 9.0  NEUTROABS  --  8.7*  --   --   --   HGB 12.4 13.5 12.1 12.1 12.3  HCT 36.3 38.8 34.9* 36.0 37.0  MCV 88.1 87.8 86.6 89.1 89.8  PLT 342 302 257 259 283   Cardiac Enzymes: No results for input(s): CKTOTAL, CKMB, CKMBINDEX, TROPONINI in the last 168 hours. BNP (last 3 results)  Recent Labs  05/15/15 1115 06/15/15 0955  BNP 111.4* 100.1*    ProBNP (last 3 results) No results for input(s): PROBNP in the last 8760 hours.  CBG: No results for input(s): GLUCAP in the last 168 hours.  Recent Results (from the past 240 hour(s))  Blood culture (routine x 2)     Status: None (Preliminary result)   Collection Time: 08/20/15 12:01 AM  Result Value Ref Range Status   Specimen Description BLOOD RIGHT ANTECUBITAL  Final   Special Requests BOTTLES DRAWN AEROBIC AND ANAEROBIC 6ML  Final   Culture   Final    NO GROWTH 1 DAY Performed at [facility]    Report Status PENDING  Incomplete  Blood culture (routine x 2)     Status: None (Preliminary result)   Collection Time: 08/20/15  3:05 AM  Result Value Ref Range Status   Specimen Description BLOOD BLOOD RIGHT FOREARM  Final   Special Requests BOTTLES DRAWN AEROBIC AND ANAEROBIC 6ML  Final   Culture   Final    NO GROWTH 1 DAY Performed at [facility]    Report Status PENDING  Incomplete     Studies: Dg Abd 1 View  08/22/2015  CLINICAL DATA:  Nausea and vomiting multiple episodes last night EXAM: ABDOMEN - 1 VIEW COMPARISON:  08/20/2015 and 08/08/2015 FINDINGS: There is normal small bowel gas pattern. Levoscoliosis and degenerative changes are noted  lumbar spine. There is moderate gastric distension with gas. Findings suspicious for gastroparesis or gastric outlet obstruction. IMPRESSION: Normal small bowel gas pattern. Moderate gaseous distension of the stomach. Findings suspicious for gastroparesis or gastric outlet obstruction. Electronically Signed   By: Lahoma Crocker M.D.   On: 08/22/2015 11:26    Scheduled Meds: . antiseptic oral rinse  7 mL Mouth Rinse BID  . azithromycin  500 mg Intravenous Q24H  . lactose free nutrition  237 mL Oral TID WC  . ondansetron (ZOFRAN) IV  4 mg Intravenous 4 times per day  . pantoprazole (PROTONIX) IV  40 mg Intravenous Q24H  . piperacillin-tazobactam (ZOSYN)  IV  3.375 g Intravenous Q8H  . sodium chloride  1,000 mL Intravenous Once  . sodium chloride  3 mL Intravenous Q12H  . vancomycin  1,000 mg Intravenous Q24H   Continuous Infusions: . dextrose 5 % and 0.9 % NaCl with KCl 20 mEq/L       Time spent: 25 minutes    Samanthan Dugo  [facility] Pager [phone]. If 7PM-7AM, please contact night-coverage at [email or web address], password [facility] 08/22/2015, 1:33 PM  LOS: 2 days

## 2015-08-22 NOTE — Progress Notes (Signed)
Pharmacy: Re- vancomycin  Patient's an 82 y.o F currently on vancomycin and zosyn for suspected intra-abdominal abscess.  Vancomycin trough level came back this morning therapeutic at 10 (goal 10-15).  - afeb, WBC down wnl, SCr  trending up 1.46 (crcl~27)-- hydrating with NS at 75 ml/hr  1/2 >>vanc >> 1/2 >>zosyn >>    1/2 bcx x2: ngtd  Trough/Dose change info: 1/4 VT at 0430= 10 (after 2 doses)   Plan: - continue vancomycin 1gm IV q24h and zosyn 3.375 gm IV q8h (infuse over 4 hours) - monitor renal function closely - f/u cultures  Dia Sitter, PharmD, BCPS 08/22/2015 8:54 AM

## 2015-08-22 NOTE — Progress Notes (Signed)
IP PROGRESS NOTE  Subjective:   She reports multiple episodes of nausea and vomiting last night.  Objective: Vital signs in last 24 hours: Blood pressure 133/80, pulse 100, temperature 97.2 F (36.2 C), temperature source Oral, resp. rate 20, height 5\' 6"  (1.676 m), weight 136 lb 0.4 oz (61.7 kg), SpO2 98 %.  Intake/Output from previous day: 01/03 0701 - 01/04 0700 In: 2363.8 [P.O.:720; I.V.:993.8; IV Piggyback:650] Out: 600 [Urine:400; Emesis/NG output:200]  Physical Exam:   Abdomen:  No hepatomegaly, no mass, nontender      Lab Results:  Recent Labs  08/21/15 0547 08/22/15 0429  WBC 13.0* 9.0  HGB 12.1 12.3  HCT 36.0 37.0  PLT 259 283    BMET  Recent Labs  08/21/15 0547 08/22/15 0429  NA 130* 135  K 3.3* 3.9  CL 95* 98*  CO2 26 26  GLUCOSE 107* 91  BUN 26* 25*  CREATININE 1.41* 1.46*  CALCIUM 8.4* 8.1*    Studies/Results: No results found.  Medications: I have reviewed the patient's current medications.  Assessment/Plan: 1. Right renal mass  MRI 08/03/2015 with multiple suspicious liver lesions, replacement of the right kidney consistent with an infiltrative neoplasm, probable rectoperineal adenopathy, extension of tumor into the IVC, small right pleural effusion  Staging CTs 08/08/2015 with no lung metastases, infiltrative right renal mass extending into the right renal vein and pararenal space, no lymphadenopathy, liver metastases  Ultrasound-guided biopsy of a right liver lesion 08/10/2015 with the preliminary pathology consistent with metastatic renal cell carcinoma  Initiation of pazopanib 08/17/2015 2. Right abdomen/flank discomfort secondary to #1  3. Family history of renal cell cancer  4. Anorexia/weight loss secondary to #1  5. Status post aortic valve replacement  6.Polymyalgia rheumatica- Embrel on hold  7.    Admission 08/20/2015 with nausea/vomiting and fever - CT abdomen/pelvis revealed new fluid attenuation  surrounding the gastric antrum and hepatic flexure   Ms. Lantrip continues to have nausea and vomiting. I reviewed the CT images in radiology. The etiology of the new fluid collection in the right abdomen is unclear. This may be related to a biliary leak as she appears to have biliary obstruction. Radiology recommends a repeat CT 08/24/2015 if she continues to have symptoms.   recommendations: 1.  Continue antibiotics and follow-up cultures 2.   Check liver panel today 3.   Repeat CT 08/23/2015 or 08/24/2015 to look for evidence of an expanding fluid collection and progressive biliary dilatation, with follow-up by interventional radiology     LOS: 2 days   [facility], Haily Caley  08/22/2015, 9:08 AM

## 2015-08-22 NOTE — Evaluation (Signed)
Physical Therapy Evaluation Patient Details Name: Kim Donovan MRN: [id number] DOB: August 20, 1928 Today's Date: 08/22/2015   History of Present Illness  Kim Donovan is a 82 y.o. female with a history of HTN. PMR, Hyperlipidemia, and Glaucoma, with recent biopsy of a Large Right Renal Mass and a Hepatic lesion on 08/10/2015 who presents to the ED with complaints of Nausea and Vomiting and Fever   Clinical Impression  Pt admitted with above diagnosis. Pt currently with functional limitations due to the deficits listed below (see PT Problem List).  Pt will benefit from skilled PT to increase their independence and safety with mobility to allow discharge to the venue listed below.  Pt doing well although more unsteady and  weaker than her baseline; encouraged pt to amb with nursing staff as well; should be able to return to [facility] with HHPT     Follow Up Recommendations Home health PT;No PT follow up (vs depending on progress)    Equipment Recommendations  Rolling walker with 5" wheels (TBA)    Recommendations for Other Services       Precautions / Restrictions Precautions Precautions: Fall Restrictions Weight Bearing Restrictions: No      Mobility  Bed Mobility Overal bed mobility: Needs Assistance Bed Mobility: Supine to Sit     Supine to sit: Supervision     General bed mobility comments: for safety  Transfers Overall transfer level: Needs assistance Equipment used: None Transfers: Sit to/from Stand Sit to Stand: Min guard;Min assist         General transfer comment: cues for safety and hand placement  Ambulation/Gait Ambulation/Gait assistance: Min assist;Min guard Ambulation Distance (Feet): 380 Feet Assistive device: Rolling walker (2 wheeled) Gait Pattern/deviations: Drifts right/left     General Gait Details: 2 standing rests d/t fatigue, cues and occasional assist to maneuver RW; pt reports being much weaker than her baseline; intermittent significant  decr in gait velocity  Stairs            Wheelchair Mobility    Modified Rankin (Stroke Patients Only)       Balance Overall balance assessment: Needs assistance           Standing balance-Leahy Scale: Fair Standing balance comment: unsteady without support when amb 10' (no RW)                             Pertinent Vitals/Pain Pain Assessment: 0-10 Pain Score: 4  Pain Location: abd pain d/t  N/V Pain Descriptors / Indicators: Sore Pain Intervention(s): Limited activity within patient's tolerance;Monitored during session;Repositioned    Home Living Family/patient expects to be discharged to:: Private residence (I living at [facility]) Living Arrangements: Spouse/significant other   Type of Home: Apartment       Home Layout: One level Home Equipment: None      Prior Function Level of Independence: Independent               Hand Dominance        Extremity/Trunk Assessment   Upper Extremity Assessment: Defer to OT evaluation           Lower Extremity Assessment: Generalized weakness         Communication   Communication: No difficulties  Cognition Arousal/Alertness: Awake/alert Behavior During Therapy: WFL for tasks assessed/performed Overall Cognitive Status: Within Functional Limits for tasks assessed  General Comments      Exercises        Assessment/Plan    PT Assessment Patient needs continued PT services  PT Diagnosis Difficulty walking   PT Problem List Decreased strength;Decreased range of motion;Decreased activity tolerance;Decreased balance;Decreased mobility  PT Treatment Interventions DME instruction;Gait training;Functional mobility training;Therapeutic activities;Patient/family education;Therapeutic exercise   PT Goals (Current goals can be found in the Care Plan section) Acute Rehab PT Goals Patient Stated Goal: return to independent  PT Goal Formulation: With  patient Time For Goal Achievement: 08/29/15 Potential to Achieve Goals: Good    Frequency Min 3X/week   Barriers to discharge        Co-evaluation               End of Session Equipment Utilized During Treatment: Gait belt Activity Tolerance: Patient tolerated treatment well;No increased pain Patient left: in chair;with call bell/phone within reach;with chair alarm set Nurse Communication: Mobility status         Time: [id number] PT Time Calculation (min) (ACUTE ONLY): 20 min   Charges:   PT Evaluation $PT Eval Low Complexity: 1 Procedure     PT G Codes:        Jahan Friedlander 2015/08/26, 11:51 AM

## 2015-08-23 ENCOUNTER — Inpatient Hospital Stay (HOSPITAL_COMMUNITY): Payer: Medicare Other

## 2015-08-23 LAB — COMPREHENSIVE METABOLIC PANEL
ALT: 37 U/L (ref 14–54)
ANION GAP: 7 (ref 5–15)
AST: 46 U/L — ABNORMAL HIGH (ref 15–41)
Albumin: 2.5 g/dL — ABNORMAL LOW (ref 3.5–5.0)
Alkaline Phosphatase: 110 U/L (ref 38–126)
BUN: 18 mg/dL (ref 6–20)
CHLORIDE: 103 mmol/L (ref 101–111)
CO2: 26 mmol/L (ref 22–32)
Calcium: 8 mg/dL — ABNORMAL LOW (ref 8.9–10.3)
Creatinine, Ser: 1.17 mg/dL — ABNORMAL HIGH (ref 0.44–1.00)
GFR, EST AFRICAN AMERICAN: 48 mL/min — AB (ref >60)
GFR, EST NON AFRICAN AMERICAN: 42 mL/min — AB (ref >60)
Glucose, Bld: 115 mg/dL — ABNORMAL HIGH (ref 65–99)
POTASSIUM: 4.3 mmol/L (ref 3.5–5.1)
SODIUM: 136 mmol/L (ref 135–145)
Total Bilirubin: 0.7 mg/dL (ref 0.3–1.2)
Total Protein: 6.5 g/dL (ref 6.5–8.1)

## 2015-08-23 LAB — CBC
HCT: 34.8 % — ABNORMAL LOW (ref 36.0–46.0)
Hemoglobin: 11.8 g/dL — ABNORMAL LOW (ref 12.0–15.0)
MCH: 29.8 pg (ref 26.0–34.0)
MCHC: 33.9 g/dL (ref 30.0–36.0)
MCV: 87.9 fL (ref 78.0–100.0)
PLATELETS: 279 10*3/uL (ref 150–400)
RBC: 3.96 MIL/uL (ref 3.87–5.11)
RDW: 13.3 % (ref 11.5–15.5)
WBC: 7 10*3/uL (ref 4.0–10.5)

## 2015-08-23 MED ORDER — IOHEXOL 300 MG/ML  SOLN
100.0000 mL | Freq: Once | INTRAMUSCULAR | Status: AC | PRN
  Administered 2015-08-23: 100 mL via INTRAVENOUS

## 2015-08-23 MED ORDER — IOHEXOL 300 MG/ML  SOLN: 50.0000 mL | Freq: Once | INTRAMUSCULAR | Status: DC | PRN

## 2015-08-23 MED ORDER — ALPRAZOLAM 0.25 MG PO TABS
0.2500 mg | ORAL_TABLET | Freq: Every evening | ORAL | Status: DC | PRN
  Administered 2015-08-23 – 2015-08-25 (×3): 0.25 mg via ORAL
  Filled 2015-08-23 (×4): qty 1

## 2015-08-23 NOTE — Progress Notes (Signed)
  Subjective: She is feeling much better, tolerated full liquids this AM, no nausea or vomiting yesterday.  Objective: Vital signs in last 24 hours: Temp:  [97.9 F (36.6 C)-98.1 F (36.7 C)] 98 F (36.7 C) (01/05 0504) Pulse Rate:  [71-90] 71 (01/05 0504) Resp:  [20] 20 (01/05 0504) BP: (121-146)/(66-77) 146/77 mmHg (01/05 0504) SpO2:  [98 %-99 %] 98 % (01/05 0504) Last BM Date: 08/22/15 860 PO yesterday, no emesis recorded. She is up to full liquids yesterday BM  X 1, Urine not recorded Afebrile, VSS Creatinine is better., K+ up to 4.3 WBC remains normal CT scan was on 08/20/15 Intake/Output from previous day: 01/04 0701 - 01/05 0700 In: 2972.5 [P.O.:860; I.V.:1762.5; IV Piggyback:350] Out: -  Intake/Output this shift: Total I/O In: 320 [P.O.:320] Out: -   General appearance: alert, cooperative and no distress GI: soft, still distended some, few BS, no pain on exam.  Lab Results:   Recent Labs  08/22/15 0429 08/23/15 0408  WBC 9.0 7.0  HGB 12.3 11.8*  HCT 37.0 34.8*  PLT 283 279    BMET  Recent Labs  08/22/15 0429 08/23/15 0408  NA 135 136  K 3.9 4.3  CL 98* 103  CO2 26 26  GLUCOSE 91 115*  BUN 25* 18  CREATININE 1.46* 1.17*  CALCIUM 8.1* 8.0*   PT/INR No results for input(s): LABPROT, INR in the last 72 hours.   Recent Labs Lab 08/19/15 2210 08/22/15 0429 08/23/15 0408  AST 59* 49* 46*  ALT 59* 38 37  ALKPHOS 181* 133* 110  BILITOT 0.9 1.1 0.7  PROT 7.8 6.6 6.5  ALBUMIN 3.5 2.6* 2.5*     Lipase     Component Value Date/Time   LIPASE 50 08/19/2015 2210     Studies/Results: Dg Abd 1 View  08/22/2015  CLINICAL DATA:  Nausea and vomiting multiple episodes last night EXAM: ABDOMEN - 1 VIEW COMPARISON:  08/20/2015 and 08/08/2015 FINDINGS: There is normal small bowel gas pattern. Levoscoliosis and degenerative changes are noted lumbar spine. There is moderate gastric distension with gas. Findings suspicious for gastroparesis or gastric  outlet obstruction. IMPRESSION: Normal small bowel gas pattern. Moderate gaseous distension of the stomach. Findings suspicious for gastroparesis or gastric outlet obstruction. Electronically Signed   By: Lahoma Crocker M.D.   On: 08/22/2015 11:26    Medications: . antiseptic oral rinse  7 mL Mouth Rinse BID  . azithromycin  500 mg Intravenous Q24H  . lactose free nutrition  237 mL Oral TID WC  . ondansetron (ZOFRAN) IV  4 mg Intravenous 4 times per day  . pantoprazole (PROTONIX) IV  40 mg Intravenous Q24H  . piperacillin-tazobactam (ZOSYN)  IV  3.375 g Intravenous Q8H  . sodium chloride  1,000 mL Intravenous Once  . sodium chloride  3 mL Intravenous Q12H  . vancomycin  1,000 mg Intravenous Q24H    Assessment/Plan 7.0 x 5.7 x 5.2 cm fluid collection with nausea, vomiting, fever and chills; sepsis S/p right renal and hepatic mass 08/10/15 by IR, for metastatic renal cell carcinoma.  Acute kidney injury creatinine is up to 1.46 Hypokalemia Hx of polymyalgia rheumatica Hx of hypertension Hx of PVD/aortic stenosis Dyslipidemia  Antibiotics: Day 5 Zosyn/Vancomycin DVT: SCD    Plan:  On going medical management.  No indication for surgery currently.   LOS: 3 days    Elbie Statzer 08/23/2015

## 2015-08-23 NOTE — Progress Notes (Signed)
IP PROGRESS NOTE  Subjective:   She did not vomit yesterday. She continues to have anorexia and nausea. The chest congestion is improved.  Objective: Vital signs in last 24 hours: Blood pressure 146/77, pulse 71, temperature 98 F (36.7 C), temperature source Oral, resp. rate 20, height 5' 6" (1.676 m), weight 136 lb 0.4 oz (61.7 kg), SpO2 98 %.  Intake/Output from previous day: 01/04 0701 - 01/05 0700 In: 2972.5 [P.O.:860; I.V.:1762.5; IV Piggyback:350] Out: -   Physical Exam: Lungs: Scattered bilateral inspiratory/expiratory rhonchi and wheezes, no respiratory distress Cardiac: Regular rate and rhythm  Abdomen:  No hepatomegaly, no mass, nontender Vascular: No leg edema     Lab Results:  Recent Labs  08/22/15 0429 08/23/15 0408  WBC 9.0 7.0  HGB 12.3 11.8*  HCT 37.0 34.8*  PLT 283 279    BMET  Recent Labs  08/22/15 0429 08/23/15 0408  NA 135 136  K 3.9 4.3  CL 98* 103  CO2 26 26  GLUCOSE 91 115*  BUN 25* 18  CREATININE 1.46* 1.17*  CALCIUM 8.1* 8.0*   Alk phosphatase 110, bilirubin 0.7  Studies/Results: Dg Abd 1 View  08/22/2015  CLINICAL DATA:  Nausea and vomiting multiple episodes last night EXAM: ABDOMEN - 1 VIEW COMPARISON:  08/20/2015 and 08/08/2015 FINDINGS: There is normal small bowel gas pattern. Levoscoliosis and degenerative changes are noted lumbar spine. There is moderate gastric distension with gas. Findings suspicious for gastroparesis or gastric outlet obstruction. IMPRESSION: Normal small bowel gas pattern. Moderate gaseous distension of the stomach. Findings suspicious for gastroparesis or gastric outlet obstruction. Electronically Signed   By: Lahoma Crocker M.D.   On: 08/22/2015 11:26    Medications: I have reviewed the patient's current medications.  Assessment/Plan: 1. Right renal mass  MRI 08/03/2015 with multiple suspicious liver lesions, replacement of the right kidney consistent with an infiltrative neoplasm, probable  rectoperineal adenopathy, extension of tumor into the IVC, small right pleural effusion  Staging CTs 08/08/2015 with no lung metastases, infiltrative right renal mass extending into the right renal vein and pararenal space, no lymphadenopathy, liver metastases  Ultrasound-guided biopsy of a right liver lesion 08/10/2015 with the preliminary pathology consistent with metastatic renal cell carcinoma  Initiation of pazopanib 08/17/2015 2. Right abdomen/flank discomfort secondary to #1  3. Family history of renal cell cancer  4. Anorexia/weight loss secondary to #1  5. Status post aortic valve replacement  6.Polymyalgia rheumatica- Embrel on hold  7.    Admission 08/20/2015 with nausea/vomiting and fever - CT abdomen/pelvis revealed new fluid attenuation surrounding the gastric antrum and hepatic flexure   Ms. Linam appears partially improved today. She will try advancing her diet. She remains afebrile and blood cultures are negative to date.   recommendations: 1.  Advanced diet and ambulate as tolerated 2.  Repeat CT abdomen for recurrent nausea/vomiting or fever     LOS: 3 days   Bernarda Erck  08/23/2015, 7:56 AM

## 2015-08-23 NOTE — Care Management Important Message (Signed)
Important Message  Patient Details  Name: Kim Donovan MRN: [id number] Date of Birth: 22-Apr-1929   Medicare Important Message Given:  Yes    [name] 08/23/2015, 1:27 [place] Message  Patient Details  Name: Kim Donovan MRN: [id number] Date of Birth: 06-06-1929   Medicare Important Message Given:  Yes    [name] 08/23/2015, 1:27 PM

## 2015-08-23 NOTE — Progress Notes (Signed)
[facility] PROGRESS NOTE  Kim Donovan [id number] DOB: 1929/07/23 DOA: 08/19/2015 PCP: Mathews Argyle, MD  Summary 08/22/15: I have seen, examined Kim Donovan at bedside and reviewed her chart including off service note by Dr. Clementeen Graham which is incorporated verbatim in my note today. Appreciate oncology/general surgery. Kim Donovan is a pleasant 82 year old female with history of recently diagnosed metastatic renal cell carcinoma with liver metastases and underwent right renal biopsy (following with Dr. Learta Codding). He started her on a trial of systemic chemotherapy with pazopanib. Other medical history includes hypertension, polymyalgia rheumatica, glaucoma and hyperlipidemia. Patient presented to the ED on 1/1 with several episodes of nausea with vomiting and fever and chills at home 2 days duration. She had a fever 101F long with poor appetite. A CT scan of the abdomen and pelvis done in the ED showed a large right kidney mass and multiple metastatic disease in the liver. Also showed fluid collection extending from the antrum of the stomach to the hepatic flexure of colon measuring 75.7 cm. Patient was found to be septic in the ED with fever off 100.69F, tachycardia and tachypnea and subsequent WBC of 14.3. He was hyponatremic with sodium of 130 and mild transaminitis. Lactic acid was unremarkable. [place] surgery consulted and patient admitted to hospitalist service.... Patient also mentions cough and her chest x-ray suggested pneumonia "Vascular congestion noted. Mild bibasilar opacities may reflect mild  interstitial edema or possibly pneumonia". She is still nauseated and is afraid to eat. Her white count has improved. Noted plans to repeat CT abdomen and pelvis on 08/23/2015 or 08/24/2015. Will change fluids to D5WNS with KCl as oral intake poor, add Zithromax, and follow oncology/general surgery recommendations. 08/23/15: Appreciate general surgery/oncology. White count remains normal at  7000. Started feeding today. Blood cultures remain negative. Will obtain repeat CT abdomen and pelvis with IV and oral contrast to follow extent of fluid collection and check for bowel obstruction. Meanwhile, will discontinue vancomycin as blood cultures negative but continue Zosyn/Zithromax to complete 7 day course of antibiotics. Will follow general surgery/oncology recommendations. Plan Sepsis (HCC)/CAP (community acquired pneumonia)  Blood cultures show no growth  Discontinue Vancomycin  Continue Zosyn/Zithromax Renal mass, right/Liver masses/AKI (acute kidney injury) (HCC)/Nausea and vomiting/Hyponatremia/Intraabdominal fluid collection/Hepatic metastases (HCC)  CT abdomen and pelvis with IV and oral contrast  Continue diet as tolerated and gentle fluids  Follow general surgery/oncology recommendations Essential hypertension/GLAUCOMA/Polymyalgia rheumatica (HCC)/S/P AVR (aortic valve replacement)/Chronic diastolic CHF (congestive heart failure) (HCC)  No acute changes  Continue current management Code Status: Full Code Family Communication: None at bedside Disposition Plan: Home eventually   Consultants:  General Surgery  Oncology  Procedures:  None  Antibiotics:  Vancomycin 08/20/15>  Zosyn 08/20/15>  Zithromax 08/22/15>  HPI/Subjective: Less nauseated and tolerating clear liquids. No abdominal pain. Less cough.  Objective: Filed Vitals:   08/22/15 2045 08/23/15 0504  BP: 121/66 146/77  Pulse: 73 71  Temp: 97.9 F (36.6 C) 98 F (36.7 C)  Resp: 20 20    Intake/Output Summary (Last 24 hours) at 08/23/15 1347 Last data filed at 08/23/15 0852  Gross per 24 hour  Intake 3007.5 ml  Output      0 ml  Net 3007.5 ml   Filed Weights   08/20/15 0431  Weight: 61.7 kg (136 lb 0.4 oz)    Exam:   General:  Comfortable at rest.  Cardiovascular: S1-S2 normal. No murmurs. Pulse regular.  Respiratory: Good air entry bilaterally. No rhonchi or  rales.  Abdomen: Soft and  nontender. Normal bowel sounds. No organomegaly.  Musculoskeletal: No pedal edema   Neurological: Intact  Data Reviewed: Basic Metabolic Panel:  Recent Labs Lab 08/19/15 2210 08/20/15 0510 08/21/15 0547 08/22/15 0429 08/23/15 0408  NA 130* 130* 130* 135 136  K 4.1 3.6 3.3* 3.9 4.3  CL 91* 95* 95* 98* 103  CO2 27 25 26 26 26   GLUCOSE 107* 95 107* 91 115*  BUN 24* 21* 26* 25* 18  CREATININE 1.19* 1.02* 1.41* 1.46* 1.17*  CALCIUM 9.6 8.6* 8.4* 8.1* 8.0*   Liver Function Tests:  Recent Labs Lab 08/19/15 2210 08/22/15 0429 08/23/15 0408  AST 59* 49* 46*  ALT 59* 38 37  ALKPHOS 181* 133* 110  BILITOT 0.9 1.1 0.7  PROT 7.8 6.6 6.5  ALBUMIN 3.5 2.6* 2.5*    Recent Labs Lab 08/19/15 2210  LIPASE 50   No results for input(s): AMMONIA in the last 168 hours. CBC:  Recent Labs Lab 08/20/15 0222 08/20/15 0510 08/21/15 0547 08/22/15 0429 08/23/15 0408  WBC 10.7* 14.3* 13.0* 9.0 7.0  NEUTROABS 8.7*  --   --   --   --   HGB 13.5 12.1 12.1 12.3 11.8*  HCT 38.8 34.9* 36.0 37.0 34.8*  MCV 87.8 86.6 89.1 89.8 87.9  PLT 302 257 259 283 279   Cardiac Enzymes: No results for input(s): CKTOTAL, CKMB, CKMBINDEX, TROPONINI in the last 168 hours. BNP (last 3 results)  Recent Labs  05/15/15 1115 06/15/15 0955  BNP 111.4* 100.1*    ProBNP (last 3 results) No results for input(s): PROBNP in the last 8760 hours.  CBG: No results for input(s): GLUCAP in the last 168 hours.  Recent Results (from the past 240 hour(s))  Blood culture (routine x 2)     Status: None (Preliminary result)   Collection Time: 08/20/15 12:01 AM  Result Value Ref Range Status   Specimen Description BLOOD RIGHT ANTECUBITAL  Final   Special Requests BOTTLES DRAWN AEROBIC AND ANAEROBIC 6ML  Final   Culture   Final    NO GROWTH 3 DAYS Performed at [facility]    Report Status PENDING  Incomplete  Blood culture (routine x 2)     Status: None (Preliminary  result)   Collection Time: 08/20/15  3:05 AM  Result Value Ref Range Status   Specimen Description BLOOD BLOOD RIGHT FOREARM  Final   Special Requests BOTTLES DRAWN AEROBIC AND ANAEROBIC 6ML  Final   Culture   Final    NO GROWTH 3 DAYS Performed at [facility]    Report Status PENDING  Incomplete     Studies: Dg Abd 1 View  08/22/2015  CLINICAL DATA:  Nausea and vomiting multiple episodes last night EXAM: ABDOMEN - 1 VIEW COMPARISON:  08/20/2015 and 08/08/2015 FINDINGS: There is normal small bowel gas pattern. Levoscoliosis and degenerative changes are noted lumbar spine. There is moderate gastric distension with gas. Findings suspicious for gastroparesis or gastric outlet obstruction. IMPRESSION: Normal small bowel gas pattern. Moderate gaseous distension of the stomach. Findings suspicious for gastroparesis or gastric outlet obstruction. Electronically Signed   By: Lahoma Crocker M.D.   On: 08/22/2015 11:26    Scheduled Meds: . antiseptic oral rinse  7 mL Mouth Rinse BID  . azithromycin  500 mg Intravenous Q24H  . lactose free nutrition  237 mL Oral TID WC  . ondansetron (ZOFRAN) IV  4 mg Intravenous 4 times per day  . pantoprazole (PROTONIX) IV  40 mg Intravenous Q24H  .  piperacillin-tazobactam (ZOSYN)  IV  3.375 g Intravenous Q8H  . sodium chloride  1,000 mL Intravenous Once  . sodium chloride  3 mL Intravenous Q12H  . vancomycin  1,000 mg Intravenous Q24H   Continuous Infusions: . dextrose 5 % and 0.9 % NaCl with KCl 20 mEq/L 75 mL/hr at 08/23/15 0459     Time spent: 20 minutes    Aaniyah Strohm  [facility] Pager [phone]. If 7PM-7AM, please contact night-coverage at [email or web address], password [facility] 08/23/2015, 1:47 PM  LOS: 3 days

## 2015-08-24 ENCOUNTER — Telehealth: Payer: Self-pay | Admitting: Nurse Practitioner

## 2015-08-24 ENCOUNTER — Other Ambulatory Visit: Payer: Self-pay | Admitting: *Deleted

## 2015-08-24 DIAGNOSIS — C641 Malignant neoplasm of right kidney, except renal pelvis: Secondary | ICD-10-CM

## 2015-08-24 DIAGNOSIS — R5081 Fever presenting with conditions classified elsewhere: Secondary | ICD-10-CM

## 2015-08-24 DIAGNOSIS — R110 Nausea: Secondary | ICD-10-CM

## 2015-08-24 LAB — CBC WITH DIFFERENTIAL/PLATELET
BASOS ABS: 0 10*3/uL (ref 0.0–0.1)
Basophils Relative: 1 %
EOS PCT: 3 %
Eosinophils Absolute: 0.2 10*3/uL (ref 0.0–0.7)
HEMATOCRIT: 35.5 % — AB (ref 36.0–46.0)
Hemoglobin: 11.8 g/dL — ABNORMAL LOW (ref 12.0–15.0)
LYMPHS ABS: 1.5 10*3/uL (ref 0.7–4.0)
LYMPHS PCT: 24 %
MCH: 29.8 pg (ref 26.0–34.0)
MCHC: 33.2 g/dL (ref 30.0–36.0)
MCV: 89.6 fL (ref 78.0–100.0)
MONO ABS: 0.6 10*3/uL (ref 0.1–1.0)
MONOS PCT: 10 %
NEUTROS ABS: 3.9 10*3/uL (ref 1.7–7.7)
Neutrophils Relative %: 62 %
PLATELETS: 302 10*3/uL (ref 150–400)
RBC: 3.96 MIL/uL (ref 3.87–5.11)
RDW: 13.4 % (ref 11.5–15.5)
WBC: 6.2 10*3/uL (ref 4.0–10.5)

## 2015-08-24 LAB — COMPREHENSIVE METABOLIC PANEL
ALT: 32 U/L (ref 14–54)
AST: 37 U/L (ref 15–41)
Albumin: 2.4 g/dL — ABNORMAL LOW (ref 3.5–5.0)
Alkaline Phosphatase: 111 U/L (ref 38–126)
Anion gap: 8 (ref 5–15)
BILIRUBIN TOTAL: 0.6 mg/dL (ref 0.3–1.2)
BUN: 11 mg/dL (ref 6–20)
CO2: 24 mmol/L (ref 22–32)
CREATININE: 1 mg/dL (ref 0.44–1.00)
Calcium: 8.1 mg/dL — ABNORMAL LOW (ref 8.9–10.3)
Chloride: 105 mmol/L (ref 101–111)
GFR, EST AFRICAN AMERICAN: 58 mL/min — AB (ref >60)
GFR, EST NON AFRICAN AMERICAN: 50 mL/min — AB (ref >60)
Glucose, Bld: 112 mg/dL — ABNORMAL HIGH (ref 65–99)
POTASSIUM: 4.2 mmol/L (ref 3.5–5.1)
Sodium: 137 mmol/L (ref 135–145)
TOTAL PROTEIN: 6.3 g/dL — AB (ref 6.5–8.1)

## 2015-08-24 MED ORDER — LEVOFLOXACIN 750 MG PO TABS
750.0000 mg | ORAL_TABLET | ORAL | Status: DC
  Administered 2015-08-24: 750 mg via ORAL
  Filled 2015-08-24: qty 1

## 2015-08-24 MED ORDER — PANTOPRAZOLE SODIUM 40 MG PO TBEC
40.0000 mg | DELAYED_RELEASE_TABLET | Freq: Every day | ORAL | Status: DC
  Administered 2015-08-25 – 2015-08-28 (×4): 40 mg via ORAL
  Filled 2015-08-24 (×4): qty 1

## 2015-08-24 MED ORDER — GUAIFENESIN-DM 100-10 MG/5ML PO SYRP
5.0000 mL | ORAL_SOLUTION | ORAL | Status: DC | PRN
  Administered 2015-08-24 – 2015-08-25 (×2): 5 mL via ORAL
  Filled 2015-08-24 (×3): qty 10

## 2015-08-24 NOTE — Progress Notes (Signed)
Pharmacy Antibiotic Follow-up Note  Kim Donovan is a 82 y.o. year-old female admitted on 08/19/2015.  The patient is currently on day 5 of Zosyn & day 3 Zithromax for PNA.  Assessment/Plan: Discussed with Dr Sanjuana Letters, will change current antibiotics to Levaquin 750mg  PO q48h to complete course Continue to monitor renal function  Temp (24hrs), Avg:97.8 F (36.6 C), Min:97.5 F (36.4 C), Max:98.1 F (36.7 C)   Recent Labs Lab 08/20/15 0510 08/21/15 0547 08/22/15 0429 08/23/15 0408 08/24/15 0440  WBC 14.3* 13.0* 9.0 7.0 6.2    Recent Labs Lab 08/20/15 0510 08/21/15 0547 08/22/15 0429 08/23/15 0408 08/24/15 0440  CREATININE 1.02* 1.41* 1.46* 1.17* 1.00   Estimated Creatinine Clearance: 39.2 mL/min (by C-G formula based on Cr of 1).    Allergies  Allergen Reactions  . Tramadol Rash    After surgery pt  broke out into a rash.Pt stop taking.    Antimicrobials this admission: 1/2 >>vanc >> 1/5  1/2 >>zosyn >> 1/6 1/4 >>azithro >>1/6 1/6>>Levaquin>>  Levels/dose changes this admission: 1/4 VT at 0430= 10 (after 2 doses)   Microbiology results: 1/2 bcx x2: ngtd  Thank you for allowing pharmacy to be a part of this patient's care.  Biagio Borg PharmD 08/24/2015 3:26 PM

## 2015-08-24 NOTE — Progress Notes (Signed)
Spoke to pt and sister at bedside concerning home health PT.  Pt and sister want to wait until she return to [facility] to talk with CM there. Pt states that [employer] will provide HHPT there.

## 2015-08-24 NOTE — Telephone Encounter (Signed)
per pof to sch pt appt-cld & left pt a message of time & date of appt °

## 2015-08-24 NOTE — Progress Notes (Signed)
Physical Therapy Treatment Patient Details Name: Kim Donovan MRN: [id number] DOB: [birth year] Today's Date: 09/02/2015    History of Present Illness Kim Donovan is a 82 y.o. female with a history of HTN. PMR, Hyperlipidemia, and Glaucoma, with recent biopsy of a Large Right Renal Mass and a Hepatic lesion on 08/10/2015 who presents to the ED with complaints of Nausea and Vomiting and Fever     PT Comments    Pt ambulated in hallway and required steadying assist from one UE however declined using RW today.  Pt agreeable to HHPT as she feels this will help her return to baseline.  Follow Up Recommendations  Home health PT     Equipment Recommendations  Cane    Recommendations for Other Services       Precautions / Restrictions Precautions Precautions: Fall Restrictions Weight Bearing Restrictions: No    Mobility  Bed Mobility Overal bed mobility: Needs Assistance Bed Mobility: Supine to Sit     Supine to sit: Supervision     General bed mobility comments: for safety  Transfers Overall transfer level: Needs assistance Equipment used: None Transfers: Sit to/from Stand Sit to Stand: Min guard         General transfer comment: cues for safety and hand placement  Ambulation/Gait Ambulation/Gait assistance: Min guard Ambulation Distance (Feet): 400 Feet Assistive device: 1 person hand held assist Gait Pattern/deviations: Step-through pattern;Decreased stride length;Drifts right/left     General Gait Details: significant decr in gait velocity, unsteady without UE support so provided HHA as pt did not wish to use RW today   Stairs            Wheelchair Mobility    Modified Rankin (Stroke Patients Only)       Balance           Standing balance support: No upper extremity supported Standing balance-Leahy Scale: Fair Standing balance comment: standing static has fair balance, with mobility uses UE support to steady                     Cognition Arousal/Alertness: Awake/alert Behavior During Therapy: WFL for tasks assessed/performed Overall Cognitive Status: Within Functional Limits for tasks assessed                      Exercises      General Comments        Pertinent Vitals/Pain Pain Assessment: No/denies pain    Home Living                      Prior Function            PT Goals (current goals can now be found in the care plan section) Progress towards PT goals: Progressing toward goals    Frequency  Min 3X/week    PT Plan Current plan remains appropriate    Co-evaluation             End of Session Equipment Utilized During Treatment: Gait belt Activity Tolerance: Patient tolerated treatment well;No increased pain Patient left: with call bell/phone within reach;in bed (sitting EOB with family/visitors, breakfast arrived)     Time: 0922-0932 PT Time Calculation (min) (ACUTE ONLY): 10 min  Charges:  $Gait Training: 8-22 mins                    G Codes:      Mason Burleigh,KATHrine E 2015/09/02, 9:44 AM Carmelia Bake, PT, DPT 09/02/15 Pager:  [phone]   

## 2015-08-24 NOTE — Progress Notes (Signed)
The patient is receiving Protonix by the intravenous route.  Based on criteria approved by the Pharmacy and [place], the medication is being converted to the equivalent oral dose form.  These criteria include: -No Active GI bleeding -Able to tolerate diet of full liquids (or better) or tube feeding OR able to tolerate other medications by the oral or enteral route  If you have any questions about this conversion, please contact the Pharmacy Department (ext [phone]).  Thank you.  Biagio Borg, [facility] 08/24/2015 3:31 PM

## 2015-08-24 NOTE — Progress Notes (Signed)
[facility] PROGRESS NOTE  Kim Donovan [id number] DOB: Sep 16, 1928 DOA: 08/19/2015 PCP: Mathews Argyle, MD  Summary 08/22/15: I have seen, examined Kim Donovan at bedside and reviewed her chart including off service note by Dr. Clementeen Graham which is incorporated verbatim in my note today. Appreciate oncology/general surgery. Kim Donovan is a pleasant 82 year old female with history of recently diagnosed metastatic renal cell carcinoma with liver metastases and underwent right renal biopsy (following with Dr. Learta Codding). He started her on a trial of systemic chemotherapy with pazopanib. Other medical history includes hypertension, polymyalgia rheumatica, glaucoma and hyperlipidemia. Patient presented to the ED on 1/1 with several episodes of nausea with vomiting and fever and chills at home 2 days duration. She had a fever 101F long with poor appetite. A CT scan of the abdomen and pelvis done in the ED showed a large right kidney mass and multiple metastatic disease in the liver. Also showed fluid collection extending from the antrum of the stomach to the hepatic flexure of colon measuring 75.7 cm. Patient was found to be septic in the ED with fever off 100.98F, tachycardia and tachypnea and subsequent WBC of 14.3. He was hyponatremic with sodium of 130 and mild transaminitis. Lactic acid was unremarkable. [place] surgery consulted and patient admitted to hospitalist service.... Patient also mentions cough and her chest x-ray suggested pneumonia "Vascular congestion noted. Mild bibasilar opacities may reflect mild  interstitial edema or possibly pneumonia". She is still nauseated and is afraid to eat. Her white count has improved. Noted plans to repeat CT abdomen and pelvis on 08/23/2015 or 08/24/2015. Will change fluids to D5WNS with KCl as oral intake poor, add Zithromax, and follow oncology/general surgery recommendations. 08/23/15: Appreciate general surgery/oncology. White count remains normal at  7000. Started feeding today. Blood cultures remain negative. Will obtain repeat CT abdomen and pelvis with IV and oral contrast to follow extent of fluid collection and check for bowel obstruction. Meanwhile, will discontinue vancomycin as blood cultures negative but continue Zosyn/Zithromax to complete 7 day course of antibiotics. Will follow general surgery/oncology recommendations. 08/24/15: Not yet strong enough to feel comfortable going home. On full liquids. Blood cultures remain negative. CT does not show fluid collection expansion but suggests pneumonia. White count continues to improve. Will deescalate antibiotics to Levaquin, advance diet as tolerated and plan d/c home in am if better. Plan Sepsis (HCC)/CAP (community acquired pneumonia)  Blood cultures show no growth  Discontinue Zosyn/Zithromax  Levaquin oral Renal mass, right/Liver masses/AKI (acute kidney injury) (HCC)/Nausea and vomiting/Hyponatremia/Intraabdominal fluid collection/Hepatic metastases (HCC)  Continue diet as tolerated and gentle fluids  Follow general surgery/oncology recommendations Essential hypertension/GLAUCOMA/Polymyalgia rheumatica (HCC)/S/P AVR (aortic valve replacement)/Chronic diastolic CHF (congestive heart failure) ([place])  No acute changes  Continue current management Code Status: Full Code Family Communication: None at bedside Disposition Plan: Home eventually   Consultants:  General Surgery  Oncology  Procedures:  None  Antibiotics:  Vancomycin 08/20/15>08/23/15  Zosyn 08/20/15>08/24/15  Zithromax 08/22/15>08/24/15  Levaquin 08/24/15>  HPI/Subjective: Better but still feels weak. Tolerating liquid diet.  Objective: Filed Vitals:   08/24/15 1354 08/24/15 2152  BP: 160/79 141/84  Pulse: 73 91  Temp: 97.7 F (36.5 C) 97.6 F (36.4 C)  Resp: 19 20    Intake/Output Summary (Last 24 hours) at 08/24/15 2208 Last data filed at 08/24/15 1355  Gross per 24 hour  Intake 1077.5 ml  Output       4 ml  Net 1073.5 ml   Filed Weights   08/20/15 0431  Weight:  61.7 kg (136 lb 0.4 oz)    Exam:   General:  Comfortable at rest.  Cardiovascular: S1-S2 normal. No murmurs. Pulse regular.  Respiratory: Good air entry bilaterally. No rhonchi or rales.  Abdomen: Soft and nontender. Normal bowel sounds. No organomegaly.  Musculoskeletal: No pedal edema   Neurological: Intact  Data Reviewed: Basic Metabolic Panel:  Recent Labs Lab 08/20/15 0510 08/21/15 0547 08/22/15 0429 08/23/15 0408 08/24/15 0440  NA 130* 130* 135 136 137  K 3.6 3.3* 3.9 4.3 4.2  CL 95* 95* 98* 103 105  CO2 25 26 26 26 24   GLUCOSE 95 107* 91 115* 112*  BUN 21* 26* 25* 18 11  CREATININE 1.02* 1.41* 1.46* 1.17* 1.00  CALCIUM 8.6* 8.4* 8.1* 8.0* 8.1*   Liver Function Tests:  Recent Labs Lab 08/19/15 2210 08/22/15 0429 08/23/15 0408 08/24/15 0440  AST 59* 49* 46* 37  ALT 59* 38 37 32  ALKPHOS 181* 133* 110 111  BILITOT 0.9 1.1 0.7 0.6  PROT 7.8 6.6 6.5 6.3*  ALBUMIN 3.5 2.6* 2.5* 2.4*    Recent Labs Lab 08/19/15 2210  LIPASE 50   No results for input(s): AMMONIA in the last 168 hours. CBC:  Recent Labs Lab 08/20/15 0222 08/20/15 0510 08/21/15 0547 08/22/15 0429 08/23/15 0408 08/24/15 0440  WBC 10.7* 14.3* 13.0* 9.0 7.0 6.2  NEUTROABS 8.7*  --   --   --   --  3.9  HGB 13.5 12.1 12.1 12.3 11.8* 11.8*  HCT 38.8 34.9* 36.0 37.0 34.8* 35.5*  MCV 87.8 86.6 89.1 89.8 87.9 89.6  PLT 302 257 259 283 279 302   Cardiac Enzymes: No results for input(s): CKTOTAL, CKMB, CKMBINDEX, TROPONINI in the last 168 hours. BNP (last 3 results)  Recent Labs  05/15/15 1115 06/15/15 0955  BNP 111.4* 100.1*    ProBNP (last 3 results) No results for input(s): PROBNP in the last 8760 hours.  CBG: No results for input(s): GLUCAP in the last 168 hours.  Recent Results (from the past 240 hour(s))  Blood culture (routine x 2)     Status: None (Preliminary result)   Collection Time:  08/20/15 12:01 AM  Result Value Ref Range Status   Specimen Description BLOOD RIGHT ANTECUBITAL  Final   Special Requests BOTTLES DRAWN AEROBIC AND ANAEROBIC 6ML  Final   Culture   Final    NO GROWTH 4 DAYS Performed at [facility]    Report Status PENDING  Incomplete  Blood culture (routine x 2)     Status: None (Preliminary result)   Collection Time: 08/20/15  3:05 AM  Result Value Ref Range Status   Specimen Description BLOOD BLOOD RIGHT FOREARM  Final   Special Requests BOTTLES DRAWN AEROBIC AND ANAEROBIC 6ML  Final   Culture   Final    NO GROWTH 4 DAYS Performed at [facility]    Report Status PENDING  Incomplete     Studies: Ct Abdomen Pelvis W Contrast  08/24/2015  ADDENDUM REPORT: 08/24/2015 11:26 ADDENDUM: I reviewed the patient's prior CT scan from 2008. This study demonstrated mild intrahepatic biliary dilatation and moderate extra hepatic biliary dilatation with the common bile duct measuring approximately 11 mm. The findings on the recent CT scans are most likely due to progressive intrahepatic biliary dilatation due to the patient's age and prior cholecystectomy. There may be some extrinsic compression of the intrapancreatic portion of the common bile duct due to the right renal mass and perinephric fluid. No obvious intraductal  mass, pancreatic mass or adenopathy. Patient's liver function studies are normal. Attention on future scans is suggested. Electronically Signed   By: Marijo Sanes M.D.   On: 08/24/2015 11:26  08/24/2015  CLINICAL DATA:  Followup diverticulosis.  Improving abdominal pain. EXAM: CT ABDOMEN AND PELVIS WITH CONTRAST TECHNIQUE: Multidetector CT imaging of the abdomen and pelvis was performed using the standard protocol following bolus administration of intravenous contrast. CONTRAST:  11mL OMNIPAQUE IOHEXOL 300 MG/ML  SOLN COMPARISON:  Prior CT scans 08/20/2015 and 08/08/2015. Prior MRI examination 08/03/2015 FINDINGS: Lower chest: A small to  moderate-sized right pleural effusion is noted. This has enlarged since the prior CT scan. There is overlying atelectasis. Patchy left lower lobe infiltrate and small left effusion. The heart is mildly enlarged. Prosthetic aortic valve is noted. Coronary artery calcifications are again demonstrated. Hepatobiliary: Stable significant intrahepatic biliary dilatation without abrupt cut off afford enters the pancreatic head. Findings could be due to a tight stricture or biliary tumor. The common bile duct in the pancreatic head is normal in caliber in the main pancreatic duct is normal in caliber. Again demonstrated are stable hepatic metastatic lesions. The portal and hepatic veins are patent. Pancreas: No mass or inflammation. Spleen: Normal size.  No focal lesions. Adrenals/Urinary Tract: Stable diffusely infiltrated right kidney, most likely by tumor. Chronic infection is also possible. Tumor/clot noted in a dilated right renal vein. Stomach/Bowel: The stomach, duodenum, small bowel and colon are grossly normal. Vascular/Lymphatic: Tortuosity, ectasia and calcification of the thoracic aorta. The branch vessels are patent. Stable mesenteric and retroperitoneal lymph nodes. Other: Stable complex fluid collection in the mesentery surrounded by small bowel loops and vessels. There is also moderate free pelvic fluid. The bladder appears normal. No pelvic mass or adenopathy. No inguinal mass or adenopathy. Musculoskeletal: No significant bony findings. Scoliosis and degenerative changes noted in the spine. IMPRESSION: 1. Overall stable CT findings in the abdomen/pelvis. 2. Enlarging right pleural effusion and worsening left lower lobe infiltrate. 3. Stable hepatic metastatic lesions. 4. Stable intrahepatic biliary dilatation with an abrupt cut off of the common bile duct most likely due to a tight stricture or cholangiocarcinoma. 5. Diffusely infiltrated right kidney. This could be an infiltrating renal cell carcinoma or  transitional cell carcinoma. 6. Stable complex fluid collection in the small bowel mesenteric. Slight increase in pelvic fluid. Electronically Signed: By: Marijo Sanes M.D. On: 08/23/2015 17:41    Scheduled Meds: . antiseptic oral rinse  7 mL Mouth Rinse BID  . lactose free nutrition  237 mL Oral TID WC  . levofloxacin  750 mg Oral Q48H  . ondansetron (ZOFRAN) IV  4 mg Intravenous 4 times per day  . [START ON 08/25/2015] pantoprazole  40 mg Oral QAC breakfast  . sodium chloride  1,000 mL Intravenous Once  . sodium chloride  3 mL Intravenous Q12H   Continuous Infusions:    Time spent: 15 minutes    Omauri Boeve  [facility] Pager [phone]. If 7PM-7AM, please contact night-coverage at [email or web address], password [facility] 08/24/2015, 10:08 PM  LOS: 4 days

## 2015-08-24 NOTE — Progress Notes (Signed)
IP PROGRESS NOTE  Subjective:   She had a large bowel movement this morning. No further nausea and vomiting.  Objective: Vital signs in last 24 hours: Blood pressure 160/79, pulse 73, temperature 97.7 F (36.5 C), temperature source Oral, resp. rate 19, height _0  (1.676 m), weight 136 lb 0.4 oz (61.7 kg), SpO2 100 %.  Intake/Output from previous day: 01/05 0701 - 01/06 0700 In: 3246.3 [P.O.:760; I.V.:1836.3; IV Piggyback:650] Out: 4 [Urine:3; Stool:1]  Physical Exam: Lungs: Scattered bilateral inspiratory/expiratory rhonchi and wheezes, no respiratory distress Cardiac: Regular rate and rhythm  Abdomen:  No hepatomegaly, no mass, nontender Vascular: No leg edema     Lab Results:  Recent Labs  08/23/15 0408 08/24/15 0440  WBC 7.0 6.2  HGB 11.8* 11.8*  HCT 34.8* 35.5*  PLT 279 302    BMET  Recent Labs  08/23/15 0408 08/24/15 0440  NA 136 137  K 4.3 4.2  CL 103 105  CO2 26 24  GLUCOSE 115* 112*  BUN 18 11  CREATININE 1.17* 1.00  CALCIUM 8.0* 8.1*   Alk phosphatase 110, bilirubin 0.7  Studies/Results: Ct Abdomen Pelvis W Contrast  08/24/2015  ADDENDUM REPORT: 08/24/2015 11:26 ADDENDUM: I reviewed the patient's prior CT scan from 2008. This study demonstrated mild intrahepatic biliary dilatation and moderate extra hepatic biliary dilatation with the common bile duct measuring approximately 11 mm. The findings on the recent CT scans are most likely due to progressive intrahepatic biliary dilatation due to the patient's age and prior cholecystectomy. There may be some extrinsic compression of the intrapancreatic portion of the common bile duct due to the right renal mass and perinephric fluid. No obvious intraductal mass, pancreatic mass or adenopathy. Patient's liver function studies are normal. Attention on future scans is suggested. Electronically Signed   By: Marijo Sanes M.D.   On: 08/24/2015 11:26  08/24/2015  CLINICAL DATA:  Followup diverticulosis.   Improving abdominal pain. EXAM: CT ABDOMEN AND PELVIS WITH CONTRAST TECHNIQUE: Multidetector CT imaging of the abdomen and pelvis was performed using the standard protocol following bolus administration of intravenous contrast. CONTRAST:  139m OMNIPAQUE IOHEXOL 300 MG/ML  SOLN COMPARISON:  Prior CT scans 08/20/2015 and 08/08/2015. Prior MRI examination 08/03/2015 FINDINGS: Lower chest: A small to moderate-sized right pleural effusion is noted. This has enlarged since the prior CT scan. There is overlying atelectasis. Patchy left lower lobe infiltrate and small left effusion. The heart is mildly enlarged. Prosthetic aortic valve is noted. Coronary artery calcifications are again demonstrated. Hepatobiliary: Stable significant intrahepatic biliary dilatation without abrupt cut off afford enters the pancreatic head. Findings could be due to a tight stricture or biliary tumor. The common bile duct in the pancreatic head is normal in caliber in the main pancreatic duct is normal in caliber. Again demonstrated are stable hepatic metastatic lesions. The portal and hepatic veins are patent. Pancreas: No mass or inflammation. Spleen: Normal size.  No focal lesions. Adrenals/Urinary Tract: Stable diffusely infiltrated right kidney, most likely by tumor. Chronic infection is also possible. Tumor/clot noted in a dilated right renal vein. Stomach/Bowel: The stomach, duodenum, small bowel and colon are grossly normal. Vascular/Lymphatic: Tortuosity, ectasia and calcification of the thoracic aorta. The branch vessels are patent. Stable mesenteric and retroperitoneal lymph nodes. Other: Stable complex fluid collection in the mesentery surrounded by small bowel loops and vessels. There is also moderate free pelvic fluid. The bladder appears normal. No pelvic mass or adenopathy. No inguinal mass or adenopathy. Musculoskeletal: No significant bony findings. Scoliosis and  degenerative changes noted in the spine. IMPRESSION: 1. Overall  stable CT findings in the abdomen/pelvis. 2. Enlarging right pleural effusion and worsening left lower lobe infiltrate. 3. Stable hepatic metastatic lesions. 4. Stable intrahepatic biliary dilatation with an abrupt cut off of the common bile duct most likely due to a tight stricture or cholangiocarcinoma. 5. Diffusely infiltrated right kidney. This could be an infiltrating renal cell carcinoma or transitional cell carcinoma. 6. Stable complex fluid collection in the small bowel mesenteric. Slight increase in pelvic fluid. Electronically Signed: By: Marijo Sanes M.D. On: 08/23/2015 17:41    Medications: I have reviewed the patient's current medications.  Assessment/Plan: 1. Right renal mass  MRI 08/03/2015 with multiple suspicious liver lesions, replacement of the right kidney consistent with an infiltrative neoplasm, probable rectoperineal adenopathy, extension of tumor into the IVC, small right pleural effusion  Staging CTs 08/08/2015 with no lung metastases, infiltrative right renal mass extending into the right renal vein and pararenal space, no lymphadenopathy, liver metastases  Ultrasound-guided biopsy of a right liver lesion 08/10/2015 with the preliminary pathology consistent with metastatic renal cell carcinoma, additional immunohistochemical histochemical stains most consistent with a poorly differentiated urothelial carcinoma  Initiation of pazopanib 08/17/2015 2. Right abdomen/flank discomfort secondary to #1  3. Family history of renal cell cancer  4. Anorexia/weight loss secondary to #1  5. Status post aortic valve replacement  6.Polymyalgia rheumatica- [place] on hold  7.    Admission 08/20/2015 with nausea/vomiting and fever - CT abdomen/pelvis revealed new fluid attenuation surrounding the gastric antrum and hepatic flexure, stable fluid collection on a repeat CT 08/23/2015  8.    Probable aspiration pneumonia January 2017   Kim Donovan appears improved. The  etiology of her fever and nausea remains unclear. She may have aspiration pneumonia.  recommendations: 1.  She is stable for discharge from an oncologic standpoint if she is ambulatory and tolerating a diet 2.  I will discuss the final pathology report further with Kim Donovan, pazopanib will remain on hold until she returns for an office visit next week. I will request additional review of the liver biopsy.     LOS: 4 days   Kim Donovan  08/24/2015, 3:22 PM

## 2015-08-24 NOTE — Progress Notes (Signed)
A call was made to [employer] to [place] to inform her of PT recommendation for HHPT.  Also shared that pt wanted to wait until she goes back to [facility], to talk with Dr. Felipa Eth about PT.

## 2015-08-25 LAB — CULTURE, BLOOD (ROUTINE X 2)
CULTURE: NO GROWTH
Culture: NO GROWTH

## 2015-08-25 LAB — COMPREHENSIVE METABOLIC PANEL
ALBUMIN: 2.7 g/dL — AB (ref 3.5–5.0)
ALK PHOS: 196 U/L — AB (ref 38–126)
ALT: 42 U/L (ref 14–54)
ANION GAP: 10 (ref 5–15)
AST: 56 U/L — ABNORMAL HIGH (ref 15–41)
BILIRUBIN TOTAL: 1.2 mg/dL (ref 0.3–1.2)
BUN: 12 mg/dL (ref 6–20)
CALCIUM: 8.6 mg/dL — AB (ref 8.9–10.3)
CO2: 26 mmol/L (ref 22–32)
CREATININE: 1.03 mg/dL — AB (ref 0.44–1.00)
Chloride: 101 mmol/L (ref 101–111)
GFR calc Af Amer: 56 mL/min — ABNORMAL LOW (ref >60)
GFR calc non Af Amer: 49 mL/min — ABNORMAL LOW (ref >60)
GLUCOSE: 110 mg/dL — AB (ref 65–99)
Potassium: 4 mmol/L (ref 3.5–5.1)
Sodium: 137 mmol/L (ref 135–145)
TOTAL PROTEIN: 6.8 g/dL (ref 6.5–8.1)

## 2015-08-25 LAB — CBC
HEMATOCRIT: 38.1 % (ref 36.0–46.0)
HEMOGLOBIN: 12.5 g/dL (ref 12.0–15.0)
MCH: 29.1 pg (ref 26.0–34.0)
MCHC: 32.8 g/dL (ref 30.0–36.0)
MCV: 88.8 fL (ref 78.0–100.0)
Platelets: 336 10*3/uL (ref 150–400)
RBC: 4.29 MIL/uL (ref 3.87–5.11)
RDW: 13.4 % (ref 11.5–15.5)
WBC: 10.8 10*3/uL — ABNORMAL HIGH (ref 4.0–10.5)

## 2015-08-25 NOTE — Progress Notes (Signed)
[facility] PROGRESS NOTE  Kim Donovan [id number] DOB: 28-Jan-1929 DOA: 08/19/2015 PCP: Mathews Argyle, MD  Summary 08/22/15: I have seen, examined Kim Donovan at bedside and reviewed her chart including off service note by Dr. Clementeen Graham which is incorporated verbatim in my note today. Appreciate oncology/general surgery. Kim Donovan is a pleasant 82 year old female with history of recently diagnosed metastatic renal cell carcinoma with liver metastases and underwent right renal biopsy (following with Dr. Learta Codding). He started her on a trial of systemic chemotherapy with pazopanib. Other medical history includes hypertension, polymyalgia rheumatica, glaucoma and hyperlipidemia. Patient presented to the ED on 1/1 with several episodes of nausea with vomiting and fever and chills at home 2 days duration. She had a fever 101F long with poor appetite. A CT scan of the abdomen and pelvis done in the ED showed a large right kidney mass and multiple metastatic disease in the liver. Also showed fluid collection extending from the antrum of the stomach to the hepatic flexure of colon measuring 75.7 cm. Patient was found to be septic in the ED with fever off 100.54F, tachycardia and tachypnea and subsequent WBC of 14.3. He was hyponatremic with sodium of 130 and mild transaminitis. Lactic acid was unremarkable. [place] surgery consulted and patient admitted to hospitalist service.... Patient also mentions cough and her chest x-ray suggested pneumonia "Vascular congestion noted. Mild bibasilar opacities may reflect mild  interstitial edema or possibly pneumonia". She is still nauseated and is afraid to eat. Her white count has improved. Noted plans to repeat CT abdomen and pelvis on 08/23/2015 or 08/24/2015. Will change fluids to D5WNS with KCl as oral intake poor, add Zithromax, and follow oncology/general surgery recommendations. 08/23/15: Appreciate general surgery/oncology. White count remains normal at  7000. Started feeding today. Blood cultures remain negative. Will obtain repeat CT abdomen and pelvis with IV and oral contrast to follow extent of fluid collection and check for bowel obstruction. Meanwhile, will discontinue vancomycin as blood cultures negative but continue Zosyn/Zithromax to complete 7 day course of antibiotics. Will follow general surgery/oncology recommendations. 08/24/15: Not yet strong enough to feel comfortable going home. On full liquids. Blood cultures remain negative. CT does not show fluid collection expansion but suggests pneumonia. White count continues to improve. Will deescalate antibiotics to Levaquin, advance diet as tolerated and plan d/c home in am if better. 08/25/15: Vomited last night but seems to have settled down. Would need rethinking whether abdominal fluid collection should be removed if vomiting persisted. Continue current management. Plan Sepsis (HCC)/CAP (community acquired pneumonia)  Blood cultures show no growth  Continue Levaquin oral Renal mass, right/Liver masses/AKI (acute kidney injury) (HCC)/Nausea and vomiting/Hyponatremia/Intraabdominal fluid collection/Hepatic metastases (HCC)  Continue diet as tolerated and gentle fluids  Consider drainage of fluid if abdominal upset continuous  Follow general surgery/oncology recommendations Essential hypertension/GLAUCOMA/Polymyalgia rheumatica (HCC)/S/P AVR (aortic valve replacement)/Chronic diastolic CHF (congestive heart failure) (HCC)  No acute changes  Continue current management Code Status: Full Code Family Communication: None at bedside Disposition Plan: Home tomorrow if tolerates feeding today   Consultants:  General Surgery  Oncology  Procedures:  None  Antibiotics:  Vancomycin 08/20/15>08/23/15  Zosyn 08/20/15>08/24/15  Zithromax 08/22/15>08/24/15  Levaquin 08/24/15>    HPI/Subjective: Less nausea today. Didn't like the food last night.  Objective: Filed Vitals:   08/25/15  0436 08/25/15 1406  BP: 143/95 140/90  Pulse: 92 96  Temp: 98.6 F (37 C) 98 F (36.7 C)  Resp: 22 20    Intake/Output Summary (Last 24 hours) at  08/25/15 1702 Last data filed at 08/25/15 1406  Gross per 24 hour  Intake    240 ml  Output      0 ml  Net    240 ml   Filed Weights   08/20/15 0431  Weight: 61.7 kg (136 lb 0.4 oz)    Exam:   General:  Comfortable at rest.  Cardiovascular: S1-S2 normal. No murmurs. Pulse regular.  Respiratory: Good air entry bilaterally. No rhonchi or rales.  Abdomen: Soft and nontender. Normal bowel sounds. No organomegaly.  Musculoskeletal: No pedal edema   Neurological: Intact  Data Reviewed: Basic Metabolic Panel:  Recent Labs Lab 08/21/15 0547 08/22/15 0429 08/23/15 0408 08/24/15 0440 08/25/15 0430  NA 130* 135 136 137 137  K 3.3* 3.9 4.3 4.2 4.0  CL 95* 98* 103 105 101  CO2 26 26 26 24 26   GLUCOSE 107* 91 115* 112* 110*  BUN 26* 25* 18 11 12   CREATININE 1.41* 1.46* 1.17* 1.00 1.03*  CALCIUM 8.4* 8.1* 8.0* 8.1* 8.6*   Liver Function Tests:  Recent Labs Lab 08/19/15 2210 08/22/15 0429 08/23/15 0408 08/24/15 0440 08/25/15 0430  AST 59* 49* 46* 37 56*  ALT 59* 38 37 32 42  ALKPHOS 181* 133* 110 111 196*  BILITOT 0.9 1.1 0.7 0.6 1.2  PROT 7.8 6.6 6.5 6.3* 6.8  ALBUMIN 3.5 2.6* 2.5* 2.4* 2.7*    Recent Labs Lab 08/19/15 2210  LIPASE 50   No results for input(s): AMMONIA in the last 168 hours. CBC:  Recent Labs Lab 08/20/15 0222  08/21/15 0547 08/22/15 0429 08/23/15 0408 08/24/15 0440 08/25/15 0430  WBC 10.7*  < > 13.0* 9.0 7.0 6.2 10.8*  NEUTROABS 8.7*  --   --   --   --  3.9  --   HGB 13.5  < > 12.1 12.3 11.8* 11.8* 12.5  HCT 38.8  < > 36.0 37.0 34.8* 35.5* 38.1  MCV 87.8  < > 89.1 89.8 87.9 89.6 88.8  PLT 302  < > 259 283 279 302 336  < > = values in this interval not displayed. Cardiac Enzymes: No results for input(s): CKTOTAL, CKMB, CKMBINDEX, TROPONINI in the last 168 hours. BNP (last 3  results)  Recent Labs  05/15/15 1115 06/15/15 0955  BNP 111.4* 100.1*    ProBNP (last 3 results) No results for input(s): PROBNP in the last 8760 hours.  CBG: No results for input(s): GLUCAP in the last 168 hours.  Recent Results (from the past 240 hour(s))  Blood culture (routine x 2)     Status: None   Collection Time: 08/20/15 12:01 AM  Result Value Ref Range Status   Specimen Description BLOOD RIGHT ANTECUBITAL  Final   Special Requests BOTTLES DRAWN AEROBIC AND ANAEROBIC 6ML  Final   Culture   Final    NO GROWTH 5 DAYS Performed at [facility]    Report Status 08/25/2015 FINAL  Final  Blood culture (routine x 2)     Status: None   Collection Time: 08/20/15  3:05 AM  Result Value Ref Range Status   Specimen Description BLOOD BLOOD RIGHT FOREARM  Final   Special Requests BOTTLES DRAWN AEROBIC AND ANAEROBIC 6ML  Final   Culture   Final    NO GROWTH 5 DAYS Performed at [facility]    Report Status 08/25/2015 FINAL  Final     Studies: Ct Abdomen Pelvis W Contrast  08/24/2015  ADDENDUM REPORT: 08/24/2015 11:26 ADDENDUM: I reviewed the  patient's prior CT scan from 2008. This study demonstrated mild intrahepatic biliary dilatation and moderate extra hepatic biliary dilatation with the common bile duct measuring approximately 11 mm. The findings on the recent CT scans are most likely due to progressive intrahepatic biliary dilatation due to the patient's age and prior cholecystectomy. There may be some extrinsic compression of the intrapancreatic portion of the common bile duct due to the right renal mass and perinephric fluid. No obvious intraductal mass, pancreatic mass or adenopathy. Patient's liver function studies are normal. Attention on future scans is suggested. Electronically Signed   By: Marijo Sanes M.D.   On: 08/24/2015 11:26  08/24/2015  CLINICAL DATA:  Followup diverticulosis.  Improving abdominal pain. EXAM: CT ABDOMEN AND PELVIS WITH CONTRAST  TECHNIQUE: Multidetector CT imaging of the abdomen and pelvis was performed using the standard protocol following bolus administration of intravenous contrast. CONTRAST:  178mL OMNIPAQUE IOHEXOL 300 MG/ML  SOLN COMPARISON:  Prior CT scans 08/20/2015 and 08/08/2015. Prior MRI examination 08/03/2015 FINDINGS: Lower chest: A small to moderate-sized right pleural effusion is noted. This has enlarged since the prior CT scan. There is overlying atelectasis. Patchy left lower lobe infiltrate and small left effusion. The heart is mildly enlarged. Prosthetic aortic valve is noted. Coronary artery calcifications are again demonstrated. Hepatobiliary: Stable significant intrahepatic biliary dilatation without abrupt cut off afford enters the pancreatic head. Findings could be due to a tight stricture or biliary tumor. The common bile duct in the pancreatic head is normal in caliber in the main pancreatic duct is normal in caliber. Again demonstrated are stable hepatic metastatic lesions. The portal and hepatic veins are patent. Pancreas: No mass or inflammation. Spleen: Normal size.  No focal lesions. Adrenals/Urinary Tract: Stable diffusely infiltrated right kidney, most likely by tumor. Chronic infection is also possible. Tumor/clot noted in a dilated right renal vein. Stomach/Bowel: The stomach, duodenum, small bowel and colon are grossly normal. Vascular/Lymphatic: Tortuosity, ectasia and calcification of the thoracic aorta. The branch vessels are patent. Stable mesenteric and retroperitoneal lymph nodes. Other: Stable complex fluid collection in the mesentery surrounded by small bowel loops and vessels. There is also moderate free pelvic fluid. The bladder appears normal. No pelvic mass or adenopathy. No inguinal mass or adenopathy. Musculoskeletal: No significant bony findings. Scoliosis and degenerative changes noted in the spine. IMPRESSION: 1. Overall stable CT findings in the abdomen/pelvis. 2. Enlarging right  pleural effusion and worsening left lower lobe infiltrate. 3. Stable hepatic metastatic lesions. 4. Stable intrahepatic biliary dilatation with an abrupt cut off of the common bile duct most likely due to a tight stricture or cholangiocarcinoma. 5. Diffusely infiltrated right kidney. This could be an infiltrating renal cell carcinoma or transitional cell carcinoma. 6. Stable complex fluid collection in the small bowel mesenteric. Slight increase in pelvic fluid. Electronically Signed: By: Marijo Sanes M.D. On: 08/23/2015 17:41    Scheduled Meds: . antiseptic oral rinse  7 mL Mouth Rinse BID  . lactose free nutrition  237 mL Oral TID WC  . levofloxacin  750 mg Oral Q48H  . ondansetron (ZOFRAN) IV  4 mg Intravenous 4 times per day  . pantoprazole  40 mg Oral QAC breakfast  . sodium chloride  1,000 mL Intravenous Once  . sodium chloride  3 mL Intravenous Q12H   Continuous Infusions:    Time spent: 25 minutes    Kim Donovan  [facility] Pager [phone]. If 7PM-7AM, please contact night-coverage at [email or web address], password [email or web address] 08/25/2015, 5:02 PM  LOS: 5 days

## 2015-08-26 DIAGNOSIS — R748 Abnormal levels of other serum enzymes: Secondary | ICD-10-CM | POA: Diagnosis not present

## 2015-08-26 LAB — CBC
HCT: 36.6 % (ref 36.0–46.0)
HEMOGLOBIN: 12.1 g/dL (ref 12.0–15.0)
MCH: 29.7 pg (ref 26.0–34.0)
MCHC: 33.1 g/dL (ref 30.0–36.0)
MCV: 89.9 fL (ref 78.0–100.0)
Platelets: 331 10*3/uL (ref 150–400)
RBC: 4.07 MIL/uL (ref 3.87–5.11)
RDW: 13.6 % (ref 11.5–15.5)
WBC: 7.7 10*3/uL (ref 4.0–10.5)

## 2015-08-26 LAB — BASIC METABOLIC PANEL
Anion gap: 9 (ref 5–15)
BUN: 17 mg/dL (ref 6–20)
CALCIUM: 8.8 mg/dL — AB (ref 8.9–10.3)
CHLORIDE: 100 mmol/L — AB (ref 101–111)
CO2: 29 mmol/L (ref 22–32)
CREATININE: 1.09 mg/dL — AB (ref 0.44–1.00)
GFR calc non Af Amer: 45 mL/min — ABNORMAL LOW (ref >60)
GFR, EST AFRICAN AMERICAN: 52 mL/min — AB (ref >60)
Glucose, Bld: 101 mg/dL — ABNORMAL HIGH (ref 65–99)
Potassium: 4 mmol/L (ref 3.5–5.1)
SODIUM: 138 mmol/L (ref 135–145)

## 2015-08-26 LAB — AMYLASE: AMYLASE: 113 U/L — AB (ref 28–100)

## 2015-08-26 LAB — INFLUENZA PANEL BY PCR (TYPE A & B)
H1N1 flu by pcr: NOT DETECTED
INFLAPCR: NEGATIVE
Influenza B By PCR: NEGATIVE

## 2015-08-26 LAB — LIPASE, BLOOD: Lipase: 61 U/L — ABNORMAL HIGH (ref 11–51)

## 2015-08-26 MED ORDER — KCL IN DEXTROSE-NACL 20-5-0.9 MEQ/L-%-% IV SOLN
INTRAVENOUS | Status: AC
  Administered 2015-08-26 – 2015-08-27 (×4): via INTRAVENOUS
  Administered 2015-08-27: 50 mL via INTRAVENOUS
  Filled 2015-08-26 (×4): qty 1000

## 2015-08-26 MED ORDER — METOCLOPRAMIDE HCL 5 MG/ML IJ SOLN
10.0000 mg | Freq: Once | INTRAMUSCULAR | Status: AC
  Administered 2015-08-26: 10 mg via INTRAVENOUS
  Filled 2015-08-26: qty 2

## 2015-08-26 NOTE — Progress Notes (Addendum)
[facility] PROGRESS NOTE  Kim Donovan [id number] DOB: 02/25/1929 DOA: 08/19/2015 PCP: Mathews Argyle, MD  Summary 08/22/15: I have seen, examined Kim Donovan at bedside and reviewed her chart including off service note by Dr. Clementeen Graham which is incorporated verbatim in my note today. Appreciate oncology/general surgery. Kim Donovan is a pleasant 82 year old female with history of recently diagnosed metastatic renal cell carcinoma with liver metastases and underwent right renal biopsy (following with Dr. Learta Codding). He started her on a trial of systemic chemotherapy with pazopanib. Other medical history includes hypertension, polymyalgia rheumatica, glaucoma and hyperlipidemia. Patient presented to the ED on 1/1 with several episodes of nausea with vomiting and fever and chills at home 2 days duration. She had a fever 101F long with poor appetite. A CT scan of the abdomen and pelvis done in the ED showed a large right kidney mass and multiple metastatic disease in the liver. Also showed fluid collection extending from the antrum of the stomach to the hepatic flexure of colon measuring 75.7 cm. Patient was found to be septic in the ED with fever off 100.4F, tachycardia and tachypnea and subsequent WBC of 14.3. He was hyponatremic with sodium of 130 and mild transaminitis. Lactic acid was unremarkable. [place] surgery consulted and patient admitted to hospitalist service.... Patient also mentions cough and her chest x-ray suggested pneumonia "Vascular congestion noted. Mild bibasilar opacities may reflect mild  interstitial edema or possibly pneumonia". She is still nauseated and is afraid to eat. Her white count has improved. Noted plans to repeat CT abdomen and pelvis on 08/23/2015 or 08/24/2015. Will change fluids to D5WNS with KCl as oral intake poor, add Zithromax, and follow oncology/general surgery recommendations. 08/23/15: Appreciate general surgery/oncology. White count remains normal at  7000. Started feeding today. Blood cultures remain negative. Will obtain repeat CT abdomen and pelvis with IV and oral contrast to follow extent of fluid collection and check for bowel obstruction. Meanwhile, will discontinue vancomycin as blood cultures negative but continue Zosyn/Zithromax to complete 7 day course of antibiotics. Will follow general surgery/oncology recommendations. 08/24/15: Not yet strong enough to feel comfortable going home. On full liquids. Blood cultures remain negative. CT does not show fluid collection expansion but suggests pneumonia. White count continues to improve. Will deescalate antibiotics to Levaquin, advance diet as tolerated and plan d/c home in am if better. 08/25/15: Vomited last night but seems to have settled down. Would need rethinking whether abdominal fluid collection should be removed if vomiting persisted. Continue current management. 08/26/15: Vomited again this afternoon. Her lipase is slightly elevated, wonder if she has acute pancreatitis, will obtain influenza swab as patient had cold-like symptoms prior to admission, will keep patient nothing by mouth, give IV fluids, discontinue antibiotics to monitor for evidence of infection in which case would seriously consider taping abdominal fluid collection. Will consult GI in a.m. Discussed plan of care with the patient and relatives including Dr. [name], patient's niece. Plan Sepsis (HCC)/CAP (community acquired pneumonia)  Blood cultures show no growth  Discontinue Levaquin and monitor for evidence of infection, request drainage of abdominal fluid collection if resurfacing of infection as this would suggest abscess formation Renal mass, right/Liver masses/AKI (acute kidney injury) (HCC)/Nausea and vomiting/Hyponatremia/Intraabdominal fluid collection/Hepatic metastases (HCC)/elevated lipase  Nothing by mouth, gentle fluids and follow lipase in a.m. check influenza swab  Consider drainage of fluid if abdominal  upset continuous  Follow general surgery/oncology recommendations  Consult to GI in a.m. Essential hypertension/GLAUCOMA/Polymyalgia rheumatica (HCC)/S/P AVR (aortic valve replacement)/Chronic diastolic CHF (congestive  heart failure) ([place])  No acute changes  Continue current management Code Status: Full Code Family Communication: None at bedside Disposition Plan: Considering short-term rehabilitation   Consultants:  General Surgery  Oncology  Procedures:  None  Antibiotics:  Vancomycin 08/20/15>08/23/15  Zosyn 08/20/15>08/24/15  Zithromax 08/22/15>08/24/15  Levaquin 08/24/15> 08/26/2015  HPI/Subjective: Nauseated.  Objective: Filed Vitals:   08/26/15 0551 08/26/15 1500  BP: 139/97 151/101  Pulse: 93 106  Temp: 98.2 F (36.8 C) 97.4 F (36.3 C)  Resp: 18 17    Intake/Output Summary (Last 24 hours) at 08/26/15 2011 Last data filed at 08/26/15 1752  Gross per 24 hour  Intake 659.17 ml  Output      0 ml  Net 659.17 ml   Filed Weights   08/20/15 0431  Weight: 61.7 kg (136 lb 0.4 oz)    Exam:   General:  Comfortable at rest.  Cardiovascular: S1-S2 normal. No murmurs. Pulse regular.  Respiratory: Good air entry bilaterally. No rhonchi or rales.  Abdomen: Soft and nontender. Normal bowel sounds. No organomegaly.  Musculoskeletal: No pedal edema   Neurological: Intact  Data Reviewed: Basic Metabolic Panel:  Recent Labs Lab 08/22/15 0429 08/23/15 0408 08/24/15 0440 08/25/15 0430 08/26/15 0405  NA 135 136 137 137 138  K 3.9 4.3 4.2 4.0 4.0  CL 98* 103 105 101 100*  CO2 26 26 24 26 29   GLUCOSE 91 115* 112* 110* 101*  BUN 25* 18 11 12 17   CREATININE 1.46* 1.17* 1.00 1.03* 1.09*  CALCIUM 8.1* 8.0* 8.1* 8.6* 8.8*   Liver Function Tests:  Recent Labs Lab 08/19/15 2210 08/22/15 0429 08/23/15 0408 08/24/15 0440 08/25/15 0430  AST 59* 49* 46* 37 56*  ALT 59* 38 37 32 42  ALKPHOS 181* 133* 110 111 196*  BILITOT 0.9 1.1 0.7 0.6 1.2  PROT 7.8  6.6 6.5 6.3* 6.8  ALBUMIN 3.5 2.6* 2.5* 2.4* 2.7*    Recent Labs Lab 08/19/15 2210 08/26/15 0405  LIPASE 50 61*  AMYLASE  --  113*   No results for input(s): AMMONIA in the last 168 hours. CBC:  Recent Labs Lab 08/20/15 0222  08/22/15 0429 08/23/15 0408 08/24/15 0440 08/25/15 0430 08/26/15 0405  WBC 10.7*  < > 9.0 7.0 6.2 10.8* 7.7  NEUTROABS 8.7*  --   --   --  3.9  --   --   HGB 13.5  < > 12.3 11.8* 11.8* 12.5 12.1  HCT 38.8  < > 37.0 34.8* 35.5* 38.1 36.6  MCV 87.8  < > 89.8 87.9 89.6 88.8 89.9  PLT 302  < > 283 279 302 336 331  < > = values in this interval not displayed. Cardiac Enzymes: No results for input(s): CKTOTAL, CKMB, CKMBINDEX, TROPONINI in the last 168 hours. BNP (last 3 results)  Recent Labs  05/15/15 1115 06/15/15 0955  BNP 111.4* 100.1*    ProBNP (last 3 results) No results for input(s): PROBNP in the last 8760 hours.  CBG: No results for input(s): GLUCAP in the last 168 hours.  Recent Results (from the past 240 hour(s))  Blood culture (routine x 2)     Status: None   Collection Time: 08/20/15 12:01 AM  Result Value Ref Range Status   Specimen Description BLOOD RIGHT ANTECUBITAL  Final   Special Requests BOTTLES DRAWN AEROBIC AND ANAEROBIC 6ML  Final   Culture   Final    NO GROWTH 5 DAYS Performed at [facility]    Report Status 08/25/2015  FINAL  Final  Blood culture (routine x 2)     Status: None   Collection Time: 08/20/15  3:05 AM  Result Value Ref Range Status   Specimen Description BLOOD BLOOD RIGHT FOREARM  Final   Special Requests BOTTLES DRAWN AEROBIC AND ANAEROBIC 6ML  Final   Culture   Final    NO GROWTH 5 DAYS Performed at [facility]    Report Status 08/25/2015 FINAL  Final     Studies: No results found.  Scheduled Meds: . antiseptic oral rinse  7 mL Mouth Rinse BID  . lactose free nutrition  237 mL Oral TID WC  . ondansetron (ZOFRAN) IV  4 mg Intravenous 4 times per day  . pantoprazole  40 mg  Oral QAC breakfast  . sodium chloride  1,000 mL Intravenous Once  . sodium chloride  3 mL Intravenous Q12H   Continuous Infusions: . dextrose 5 % and 0.9 % NaCl with KCl 20 mEq/L 50 mL/hr at 08/26/15 1441     Time spent: 25 minutes    Kim Donovan  [facility] Pager [phone]. If 7PM-7AM, please contact night-coverage at [email or web address], password [facility] 08/26/2015, 8:11 PM  LOS: 6 days

## 2015-08-27 ENCOUNTER — Inpatient Hospital Stay (HOSPITAL_COMMUNITY): Payer: Medicare Other

## 2015-08-27 DIAGNOSIS — R740 Nonspecific elevation of levels of transaminase and lactic acid dehydrogenase [LDH]: Secondary | ICD-10-CM

## 2015-08-27 DIAGNOSIS — R509 Fever, unspecified: Secondary | ICD-10-CM | POA: Diagnosis present

## 2015-08-27 DIAGNOSIS — K838 Other specified diseases of biliary tract: Secondary | ICD-10-CM | POA: Insufficient documentation

## 2015-08-27 DIAGNOSIS — R945 Abnormal results of liver function studies: Secondary | ICD-10-CM

## 2015-08-27 DIAGNOSIS — R7401 Elevation of levels of liver transaminase levels: Secondary | ICD-10-CM | POA: Diagnosis not present

## 2015-08-27 DIAGNOSIS — C799 Secondary malignant neoplasm of unspecified site: Secondary | ICD-10-CM

## 2015-08-27 DIAGNOSIS — R188 Other ascites: Secondary | ICD-10-CM

## 2015-08-27 DIAGNOSIS — R7989 Other specified abnormal findings of blood chemistry: Secondary | ICD-10-CM

## 2015-08-27 LAB — CBC WITH DIFFERENTIAL/PLATELET
BASOS ABS: 0 10*3/uL (ref 0.0–0.1)
Basophils Relative: 0 %
EOS PCT: 1 %
Eosinophils Absolute: 0.1 10*3/uL (ref 0.0–0.7)
HEMATOCRIT: 38.7 % (ref 36.0–46.0)
HEMOGLOBIN: 12.7 g/dL (ref 12.0–15.0)
LYMPHS PCT: 15 %
Lymphs Abs: 1.6 10*3/uL (ref 0.7–4.0)
MCH: 29.6 pg (ref 26.0–34.0)
MCHC: 32.8 g/dL (ref 30.0–36.0)
MCV: 90.2 fL (ref 78.0–100.0)
Monocytes Absolute: 0.9 10*3/uL (ref 0.1–1.0)
Monocytes Relative: 9 %
NEUTROS ABS: 7.5 10*3/uL (ref 1.7–7.7)
NEUTROS PCT: 75 %
PLATELETS: 342 10*3/uL (ref 150–400)
RBC: 4.29 MIL/uL (ref 3.87–5.11)
RDW: 13.8 % (ref 11.5–15.5)
WBC: 10.1 10*3/uL (ref 4.0–10.5)

## 2015-08-27 LAB — COMPREHENSIVE METABOLIC PANEL
ALK PHOS: 352 U/L — AB (ref 38–126)
ALT: 98 U/L — AB (ref 14–54)
AST: 149 U/L — AB (ref 15–41)
Albumin: 2.7 g/dL — ABNORMAL LOW (ref 3.5–5.0)
Anion gap: 9 (ref 5–15)
BUN: 19 mg/dL (ref 6–20)
CHLORIDE: 98 mmol/L — AB (ref 101–111)
CO2: 31 mmol/L (ref 22–32)
CREATININE: 1.16 mg/dL — AB (ref 0.44–1.00)
Calcium: 9 mg/dL (ref 8.9–10.3)
GFR calc Af Amer: 49 mL/min — ABNORMAL LOW (ref >60)
GFR, EST NON AFRICAN AMERICAN: 42 mL/min — AB (ref >60)
Glucose, Bld: 122 mg/dL — ABNORMAL HIGH (ref 65–99)
Potassium: 4 mmol/L (ref 3.5–5.1)
Sodium: 138 mmol/L (ref 135–145)
Total Bilirubin: 1.7 mg/dL — ABNORMAL HIGH (ref 0.3–1.2)
Total Protein: 6.9 g/dL (ref 6.5–8.1)

## 2015-08-27 LAB — AMYLASE: Amylase: 112 U/L — ABNORMAL HIGH (ref 28–100)

## 2015-08-27 LAB — LIPASE, BLOOD: LIPASE: 49 U/L (ref 11–51)

## 2015-08-27 MED ORDER — PHENOL 1.4 % MT LIQD
1.0000 | OROMUCOSAL | Status: DC | PRN
  Administered 2015-08-27: 1 via OROMUCOSAL
  Filled 2015-08-27: qty 177

## 2015-08-27 MED ORDER — PIPERACILLIN-TAZOBACTAM 3.375 G IVPB
3.3750 g | Freq: Three times a day (TID) | INTRAVENOUS | Status: DC
  Administered 2015-08-27 – 2015-09-02 (×19): 3.375 g via INTRAVENOUS
  Filled 2015-08-27 (×19): qty 50

## 2015-08-27 MED ORDER — MIDAZOLAM HCL 2 MG/2ML IJ SOLN
INTRAMUSCULAR | Status: AC
  Filled 2015-08-27: qty 4

## 2015-08-27 MED ORDER — FENTANYL CITRATE (PF) 100 MCG/2ML IJ SOLN
INTRAMUSCULAR | Status: DC
Start: 2015-08-27 — End: 2015-08-27
  Filled 2015-08-27: qty 2

## 2015-08-27 MED ORDER — METOCLOPRAMIDE HCL 5 MG/ML IJ SOLN
10.0000 mg | Freq: Once | INTRAMUSCULAR | Status: AC
  Administered 2015-08-27: 10 mg via INTRAVENOUS
  Filled 2015-08-27: qty 2

## 2015-08-27 NOTE — Progress Notes (Addendum)
Physical Therapy Treatment Patient Details Name: Kim Donovan MRN: [id number] DOB: Oct 08, 1928 Today's Date: 08/27/2015    History of Present Illness ZAIYAH TANK is a 82 y.o. female with a history of HTN. PMR, Hyperlipidemia, and Glaucoma, with recent biopsy of a Large Right Renal Mass and a Hepatic lesion on 08/10/2015 who presents to the ED with complaints of Nausea and Vomiting and Fever     PT Comments    Pt requiring more UE support today and PT limited mobility due to tachycardia with gait (up to 146 bpm). Would benefit from ST -SNF upon d/c.  Follow Up Recommendations  Supervision for mobility/OOB;SNF     Equipment Recommendations  Rolling walker with 5" wheels    Recommendations for Other Services       Precautions / Restrictions Precautions Precautions: Fall Precaution Comments: monitor HR    Mobility  Bed Mobility Overal bed mobility: Needs Assistance Bed Mobility: Supine to Sit     Supine to sit: Supervision     General bed mobility comments: for safety  Transfers Overall transfer level: Needs assistance Equipment used: 1 person hand held assist Transfers: Sit to/from Stand Sit to Stand: Min assist         General transfer comment: assist to rise and steady, requiring UE support today  Ambulation/Gait Ambulation/Gait assistance: Min assist Ambulation Distance (Feet): 200 Feet Assistive device: Rolling walker (2 wheeled) Gait Pattern/deviations: Step-through pattern;Decreased stride length     General Gait Details: requiring UE support today so used RW, HR up to 146 per telemetry so had pt take short standing rest break (after 100 ft) then ambulated back to room (MD in hallway with pt and came into room so aware)   Stairs            Wheelchair Mobility    Modified Rankin (Stroke Patients Only)       Balance           Standing balance support: Bilateral upper extremity supported Standing balance-Leahy Scale: Poor                       Cognition Arousal/Alertness: Awake/alert Behavior During Therapy: WFL for tasks assessed/performed Overall Cognitive Status: Within Functional Limits for tasks assessed                      Exercises      General Comments        Pertinent Vitals/Pain Pain Assessment: No/denies pain Pain Intervention(s): Limited activity within patient's tolerance;Monitored during session;Repositioned    Home Living                      Prior Function            PT Goals (current goals can now be found in the care plan section) Progress towards PT goals: Progressing toward goals    Frequency  Min 3X/week    PT Plan Discharge plan needs to be updated    Co-evaluation             End of Session Equipment Utilized During Treatment: Gait belt Activity Tolerance: Patient tolerated treatment well (limited by tachycardia) Patient left: in chair;with call bell/phone within reach;with family/visitor present     Time: 1143-1200 PT Time Calculation (min) (ACUTE ONLY): 17 min  Charges:  $Gait Training: 8-22 mins                    G  Codes:      Trena Platt 2015-08-30, 1:18 PM Carmelia Bake, PT, DPT August 30, 2015 Pager: [phone]

## 2015-08-27 NOTE — Progress Notes (Signed)
Patient ID: Kim Donovan, female   DOB: May 03, 1929, 82 y.o.   MRN: [id number]    Referring Physician(s): Sherrill,B  Chief Complaint:  Abdominal fluid collection  Subjective: Patient familiar to IR service from prior liver lesion biopsy on 08/10/15. Pathology revealed findings suggestive of metastatic renal cell carcinoma. Her past medical history is significant for hyperlipidemia, osteoarthritis, aortic stenosis with prior aortic valve replacement, polymyalgia rheumatica, peripheral vascular disease, hypertension and positive family history of renal cell carcinoma. Patient had an approximately 2 month history of right-sided abdominal/flank discomfort with subsequent imaging revealing markedly abnormal right kidney with extension of abnormal process medially into the IVC and with adjacent retro-peritoneal adenopathy. In addition there were right-sided liver lesions , small right pleural effusion and biliary duct dilatation. She was recently admitted to the hospital again on 1/2 with multiple episodes of nausea and vomiting. CT of the abdomen at that time revealed a new vague fluid collection tracking adjacent to the antrum of the stomach and hepatic flexure of the colon concerning for evolving abscess. Follow-up CT on 1/5 revealed persistent fluid collection as mentioned above as well as an enlarging right pleural effusion and worsening left lower lobe infiltrate. There was stable intrahepatic biliary dilatation with an abrupt cut off of the common bile duct .Request is now received from oncology for aspiration and possible drainage of the midabdominal fluid collection. Patient currently denies fever chills, headache, chest pain, back pain for abnormal bleeding. She does endorse fatigue, malnutrition, dyspnea with exertion, occasional cough, right upper quadrant abdominal discomfort as well as above-mentioned nausea and vomiting.     Allergies: Tramadol  Medications: Prior to Admission medications    Medication Sig Start Date End Date Taking? Authorizing Provider  ALPRAZolam (XANAX) 0.25 MG tablet Take 1 tablet (0.25 mg total) by mouth at bedtime as needed for anxiety. 08/06/15  Yes Ladell Pier, MD  aspirin 81 MG tablet Take 81 mg by mouth daily.     Yes Historical Provider, MD  Calcium Citrate-Vitamin D 250-200 MG-UNIT TABS Take 2 tablets by mouth daily.    Yes Historical Provider, MD  Diphenhydramine-APAP, sleep, (TYLENOL PM EXTRA STRENGTH PO) Take 1 tablet by mouth at bedtime as needed (pain/sleep).    Yes Historical Provider, MD  dorzolamide-timolol (COSOPT) 22.3-6.8 MG/ML ophthalmic solution Place 1 drop into the left eye 2 (two) times daily.    Yes Historical Provider, MD  furosemide (LASIX) 20 MG tablet Take 1 tablet (20 mg total) by mouth daily. 05/15/15  Yes Larey Dresser, MD  Multiple Vitamins-Minerals (CENTRUM SILVER ULTRA WOMENS PO) Take 1 tablet by mouth daily.     Yes Historical Provider, MD  Omega-3 Fatty Acids (FISH OIL) 1000 MG CAPS Take 1 capsule by mouth daily. Reported on 08/06/2015   Yes Historical Provider, MD  pazopanib (VOTRIENT) 200 MG tablet Take 4 tablets (800 mg total) by mouth daily. Take on an empty stomach. 08/14/15  Yes Ladell Pier, MD  PREDNISONE, PAK, PO Take 2.5 mg by mouth every other day.    Yes Historical Provider, MD  ezetimibe (ZETIA) 10 MG tablet Take 1 tablet (10 mg total) by mouth daily. 05/12/14   Jolaine Artist, MD  potassium chloride (K-DUR) 10 MEQ tablet Take 1 tablet (10 mEq total) by mouth daily. Patient not taking: Reported on 08/14/2015 05/15/15   Larey Dresser, MD     Vital Signs: BP 138/89 mmHg  Pulse 107  Temp(Src) 97.7 F (36.5 C) (Oral)  Resp 20  Ht  5\' 6"  (1.676 m)  Wt 136 lb 0.4 oz (61.7 kg)  BMI 21.97 kg/m2  SpO2 98%  Physical Exam patient awake/ alert.  chest with scattered rhonchi with diminished breath sounds at bases. Heart slightly tachycardic but regular. Abdomen soft, positive bowel sounds, mild  tenderness to palpation right upper quadrant. Extremities with full range of motion and no significant edema.   Imaging: Ct Abdomen Pelvis W Contrast  08/24/2015  ADDENDUM REPORT: 08/24/2015 11:26 ADDENDUM: I reviewed the patient's prior CT scan from 2008. This study demonstrated mild intrahepatic biliary dilatation and moderate extra hepatic biliary dilatation with the common bile duct measuring approximately 11 mm. The findings on the recent CT scans are most likely due to progressive intrahepatic biliary dilatation due to the patient's age and prior cholecystectomy. There may be some extrinsic compression of the intrapancreatic portion of the common bile duct due to the right renal mass and perinephric fluid. No obvious intraductal mass, pancreatic mass or adenopathy. Patient's liver function studies are normal. Attention on future scans is suggested. Electronically Signed   By: Marijo Sanes M.D.   On: 08/24/2015 11:26  08/24/2015  CLINICAL DATA:  Followup diverticulosis.  Improving abdominal pain. EXAM: CT ABDOMEN AND PELVIS WITH CONTRAST TECHNIQUE: Multidetector CT imaging of the abdomen and pelvis was performed using the standard protocol following bolus administration of intravenous contrast. CONTRAST:  151mL OMNIPAQUE IOHEXOL 300 MG/ML  SOLN COMPARISON:  Prior CT scans 08/20/2015 and 08/08/2015. Prior MRI examination 08/03/2015 FINDINGS: Lower chest: A small to moderate-sized right pleural effusion is noted. This has enlarged since the prior CT scan. There is overlying atelectasis. Patchy left lower lobe infiltrate and small left effusion. The heart is mildly enlarged. Prosthetic aortic valve is noted. Coronary artery calcifications are again demonstrated. Hepatobiliary: Stable significant intrahepatic biliary dilatation without abrupt cut off afford enters the pancreatic head. Findings could be due to a tight stricture or biliary tumor. The common bile duct in the pancreatic head is normal in caliber in  the main pancreatic duct is normal in caliber. Again demonstrated are stable hepatic metastatic lesions. The portal and hepatic veins are patent. Pancreas: No mass or inflammation. Spleen: Normal size.  No focal lesions. Adrenals/Urinary Tract: Stable diffusely infiltrated right kidney, most likely by tumor. Chronic infection is also possible. Tumor/clot noted in a dilated right renal vein. Stomach/Bowel: The stomach, duodenum, small bowel and colon are grossly normal. Vascular/Lymphatic: Tortuosity, ectasia and calcification of the thoracic aorta. The branch vessels are patent. Stable mesenteric and retroperitoneal lymph nodes. Other: Stable complex fluid collection in the mesentery surrounded by small bowel loops and vessels. There is also moderate free pelvic fluid. The bladder appears normal. No pelvic mass or adenopathy. No inguinal mass or adenopathy. Musculoskeletal: No significant bony findings. Scoliosis and degenerative changes noted in the spine. IMPRESSION: 1. Overall stable CT findings in the abdomen/pelvis. 2. Enlarging right pleural effusion and worsening left lower lobe infiltrate. 3. Stable hepatic metastatic lesions. 4. Stable intrahepatic biliary dilatation with an abrupt cut off of the common bile duct most likely due to a tight stricture or cholangiocarcinoma. 5. Diffusely infiltrated right kidney. This could be an infiltrating renal cell carcinoma or transitional cell carcinoma. 6. Stable complex fluid collection in the small bowel mesenteric. Slight increase in pelvic fluid. Electronically Signed: By: Marijo Sanes M.D. On: 08/23/2015 17:41    Labs:  CBC:  Recent Labs  08/24/15 0440 08/25/15 0430 08/26/15 0405 08/27/15 0540  WBC 6.2 10.8* 7.7 10.1  HGB 11.8* 12.5  12.1 12.7  HCT 35.5* 38.1 36.6 38.7  PLT 302 336 331 342    COAGS:  Recent Labs  08/10/15 0715 08/20/15 0510  INR 0.99 1.24  APTT  --  38*    BMP:  Recent Labs  08/24/15 0440 08/25/15 0430  08/26/15 0405 08/27/15 0540  NA 137 137 138 138  K 4.2 4.0 4.0 4.0  CL 105 101 100* 98*  CO2 24 26 29 31   GLUCOSE 112* 110* 101* 122*  BUN 11 12 17 19   CALCIUM 8.1* 8.6* 8.8* 9.0  CREATININE 1.00 1.03* 1.09* 1.16*  GFRNONAA 50* 49* 45* 42*  GFRAA 58* 56* 52* 49*    LIVER FUNCTION TESTS:  Recent Labs  08/23/15 0408 08/24/15 0440 08/25/15 0430 08/27/15 0540  BILITOT 0.7 0.6 1.2 1.7*  AST 46* 37 56* 149*  ALT 37 32 42 98*  ALKPHOS 110 111 196* 352*  PROT 6.5 6.3* 6.8 6.9  ALBUMIN 2.5* 2.4* 2.7* 2.7*    Assessment and Plan:  Patient with recently diagnosed metastatic renal cell carcinoma; status post liver lesion biopsy on 08/10/15 ;now with persistent nausea and vomiting and CT finding of new vague fluid collection tracking along antrum of the stomach and hepatic flexure of colon suspicious for abscess. Request received for aspiration/possible drainage of collection.  Imaging studies were reviewed by Dr. Barbie Banner. At this time plan is for follow-up CT of abdomen today to reassess size of fluid collection, and possible NG tube placement due to persistent vomiting. Once vomiting is under control then may proceed with conscious sedation/aspiration/drainage of fluid collection if necessary. Above plans as well as details/risks of aspiration/drainage of abdominal fluid collection including but not limited to internal bleeding, infection, organ injury, risk of aspiration pneumonia discussed with patient and family with their understanding and consent.   Signed: D. Rowe Robert 08/27/2015, 2:43 PM   I spent a total of 20 minutes at the the patient's bedside AND on the patient's hospital floor or unit, greater than 50% of which was counseling/coordinating care for CT guided aspiration/possible drainage of abdominal fluid collection

## 2015-08-27 NOTE — Clinical Social Work Placement (Signed)
   CLINICAL SOCIAL WORK PLACEMENT  NOTE  Date:  08/27/2015  Patient Details  Name: DENISE GRBIC MRN: [id number] Date of Birth: October 11, 1928  Clinical Social Work is seeking post-discharge placement for this patient at the [facility] level of care (*CSW will initial, date and re-position this form in  chart as items are completed):  Yes   Patient/family provided with [place] Work Department's list of facilities offering this level of care within the geographic area requested by the patient (or if unable, by the patient's family).  Yes   Patient/family informed of their freedom to choose among providers that offer the needed level of care, that participate in Medicare, Medicaid or managed care program needed by the patient, have an available bed and are willing to accept the patient.  Yes   Patient/family informed of [facility]'s ownership interest in [facility] and [facility], as well as of the fact that they are under no obligation to receive care at these facilities.  PASRR submitted to EDS on 08/27/15     PASRR number received on 08/27/15     Existing PASRR number confirmed on       FL2 transmitted to all facilities in geographic area requested by pt/family on 08/27/15     FL2 transmitted to all facilities within larger geographic area on       Patient informed that his/her managed care company has contracts with or will negotiate with certain facilities, including the following:        Yes   Patient/family informed of bed offers received.  Patient chooses bed at [facility]     Physician recommends and patient chooses bed at      Patient to be transferred to [facility] on  .  Patient to be transferred to facility by       Patient family notified on   of transfer.  Name of family member notified:        PHYSICIAN       Additional Comment:    _______________________________________________ Standley Brooking,  LCSW 08/27/2015, 2:16 PM

## 2015-08-27 NOTE — Progress Notes (Signed)
[facility] PROGRESS NOTE  Kim Donovan [id number] DOB: 05/03/1929 DOA: 08/19/2015 PCP: Mathews Argyle, MD  Summary 08/22/15: I have seen, examined Kim Donovan at bedside and reviewed her chart including off service note by Dr. Clementeen Graham which is incorporated verbatim in my note today. Appreciate oncology/general surgery. Kim Donovan is a pleasant 82 year old female with history of recently diagnosed metastatic renal cell carcinoma with liver metastases and underwent right renal biopsy (following with Dr. Learta Codding). He started her on a trial of systemic chemotherapy with pazopanib. Other medical history includes hypertension, polymyalgia rheumatica, glaucoma and hyperlipidemia. Patient presented to the ED on 1/1 with several episodes of nausea with vomiting and fever and chills at home 2 days duration. She had a fever 101F long with poor appetite. A CT scan of the abdomen and pelvis done in the ED showed a large right kidney mass and multiple metastatic disease in the liver. Also showed fluid collection extending from the antrum of the stomach to the hepatic flexure of colon measuring 75.7 cm. Patient was found to be septic in the ED with fever off 100.51F, tachycardia and tachypnea and subsequent WBC of 14.3. He was hyponatremic with sodium of 130 and mild transaminitis. Lactic acid was unremarkable. [facility] consulted and patient admitted to hospitalist service.... Patient also mentions cough and her chest x-ray suggested pneumonia "Vascular congestion noted. Mild bibasilar opacities may reflect mild  interstitial edema or possibly pneumonia". She is still nauseated and is afraid to eat. Her white count has improved. Noted plans to repeat CT abdomen and pelvis on 08/23/2015 or 08/24/2015. Will change fluids to D5WNS with KCl as oral intake poor, add Zithromax, and follow oncology/general surgery recommendations. 08/23/15: Appreciate general surgery/oncology. White count remains normal at  7000. Started feeding today. Blood cultures remain negative. Will obtain repeat CT abdomen and pelvis with IV and oral contrast to follow extent of fluid collection and check for bowel obstruction. Meanwhile, will discontinue vancomycin as blood cultures negative but continue Zosyn/Zithromax to complete 7 day course of antibiotics. Will follow general surgery/oncology recommendations. 08/24/15: Not yet strong enough to feel comfortable going home. On full liquids. Blood cultures remain negative. CT does not show fluid collection expansion but suggests pneumonia. White count continues to improve. Will deescalate antibiotics to Levaquin, advance diet as tolerated and plan d/c home in am if better. 08/25/15: Vomited last night but seems to have settled down. Would need rethinking whether abdominal fluid collection should be removed if vomiting persisted. Continue current management. 08/26/15: Vomited again this afternoon. Her lipase is slightly elevated, wonder if she has acute pancreatitis, will obtain influenza swab as patient had cold-like symptoms prior to admission, will keep patient nothing by mouth, give IV fluids, discontinue antibiotics to monitor for evidence of infection in which case would seriously consider taping abdominal fluid collection. Will consult GI in a.m. Discussed plan of care with the patient and relatives including Dr. [name], patient's niece. 08/27/15: Appreciate oncology. Continued to vomit last night, has increasing transaminitis, fever and tachycardia. Amylase is also elevated. Patient came off antibiotics yesterday suggesting that she may have localized source of infection including abscess collection being suppressed by antibiotics. Influenza swab negative fortunately. Discussed with Dr. Sherrill/Dr [name]. Will consult GI, keep patient nothing by mouth, give IV fluids and panculture including UA/urine culture/blood culture, will have low threshold to resume antibiotics if fever  persists. Plan Sepsis (HCC)/CAP (community acquired pneumonia)/fever  Repeat UA/urine culture/blood culture  Resume antibiotics if fever persists and consider draining fluid collection  if sufficient for interventional radiology guided drainage. Renal mass, right/Liver masses/AKI (acute kidney injury) (HCC)/Nausea and vomiting/Hyponatremia/Intraabdominal fluid collection/Hepatic metastases (HCC)/elevated lipase/transaminitis  Nothing by mouth, gentle fluids and follow pancreatic/liver enzymes  Consider drainage of fluid if abdominal upset continuous  Follow general surgery/oncology recommendations  Consult GI-for persistent nausea/transaminitis. Spoke with [facility] who will graciously see patient in consult. Essential hypertension/GLAUCOMA/Polymyalgia rheumatica (HCC)/S/P AVR (aortic valve replacement)/Chronic diastolic CHF (congestive heart failure) (HCC)  No acute changes  Continue current management Code Status: Full Code Family Communication: None at bedside Disposition Plan: Considering short-term rehabilitation   Consultants:  General Surgery  Oncology  Procedures:  None  Antibiotics:  Vancomycin 08/20/15>08/23/15  Zosyn 08/20/15>08/24/15  Zithromax 08/22/15>08/24/15  Levaquin 08/24/15> 08/26/2015  HPI/Subjective: Had a rough night. Says she feels worse than when she came in.  Objective: Filed Vitals:   08/27/15 0203 08/27/15 0544  BP: 129/95 136/91  Pulse: 117 100  Temp: 98.6 F (37 C) 97.7 F (36.5 C)  Resp: 18 18    Intake/Output Summary (Last 24 hours) at 08/27/15 1315 Last data filed at 08/27/15 0700  Gross per 24 hour  Intake 935.84 ml  Output      1 ml  Net 934.84 ml   Filed Weights   08/20/15 0431  Weight: 61.7 kg (136 lb 0.4 oz)    Exam:   General:  Ill looking.  Cardiovascular: S1-S2 normal. No murmurs. Regular tachycardia.  Respiratory: Good air entry bilaterally. No rhonchi or rales.  Abdomen: Soft and nontender. Normal bowel  sounds. No organomegaly.  Musculoskeletal: No pedal edema   Neurological: Intact  Data Reviewed: Basic Metabolic Panel:  Recent Labs Lab 08/23/15 0408 08/24/15 0440 08/25/15 0430 08/26/15 0405 08/27/15 0540  NA 136 137 137 138 138  K 4.3 4.2 4.0 4.0 4.0  CL 103 105 101 100* 98*  CO2 26 24 26 29 31   GLUCOSE 115* 112* 110* 101* 122*  BUN 18 11 12 17 19   CREATININE 1.17* 1.00 1.03* 1.09* 1.16*  CALCIUM 8.0* 8.1* 8.6* 8.8* 9.0   Liver Function Tests:  Recent Labs Lab 08/22/15 0429 08/23/15 0408 08/24/15 0440 08/25/15 0430 08/27/15 0540  AST 49* 46* 37 56* 149*  ALT 38 37 32 42 98*  ALKPHOS 133* 110 111 196* 352*  BILITOT 1.1 0.7 0.6 1.2 1.7*  PROT 6.6 6.5 6.3* 6.8 6.9  ALBUMIN 2.6* 2.5* 2.4* 2.7* 2.7*    Recent Labs Lab 08/26/15 0405 08/27/15 0540  LIPASE 61* 49  AMYLASE 113* 112*   No results for input(s): AMMONIA in the last 168 hours. CBC:  Recent Labs Lab 08/23/15 0408 08/24/15 0440 08/25/15 0430 08/26/15 0405 08/27/15 0540  WBC 7.0 6.2 10.8* 7.7 10.1  NEUTROABS  --  3.9  --   --  7.5  HGB 11.8* 11.8* 12.5 12.1 12.7  HCT 34.8* 35.5* 38.1 36.6 38.7  MCV 87.9 89.6 88.8 89.9 90.2  PLT 279 302 336 331 342   Cardiac Enzymes: No results for input(s): CKTOTAL, CKMB, CKMBINDEX, TROPONINI in the last 168 hours. BNP (last 3 results)  Recent Labs  05/15/15 1115 06/15/15 0955  BNP 111.4* 100.1*    ProBNP (last 3 results) No results for input(s): PROBNP in the last 8760 hours.  CBG: No results for input(s): GLUCAP in the last 168 hours.  Recent Results (from the past 240 hour(s))  Blood culture (routine x 2)     Status: None   Collection Time: 08/20/15 12:01 AM  Result Value Ref Range  Status   Specimen Description BLOOD RIGHT ANTECUBITAL  Final   Special Requests BOTTLES DRAWN AEROBIC AND ANAEROBIC 6ML  Final   Culture   Final    NO GROWTH 5 DAYS Performed at [facility]    Report Status 08/25/2015 FINAL  Final  Blood culture  (routine x 2)     Status: None   Collection Time: 08/20/15  3:05 AM  Result Value Ref Range Status   Specimen Description BLOOD BLOOD RIGHT FOREARM  Final   Special Requests BOTTLES DRAWN AEROBIC AND ANAEROBIC 6ML  Final   Culture   Final    NO GROWTH 5 DAYS Performed at [facility]    Report Status 08/25/2015 FINAL  Final     Studies: No results found.  Scheduled Meds: . antiseptic oral rinse  7 mL Mouth Rinse BID  . lactose free nutrition  237 mL Oral TID WC  . ondansetron (ZOFRAN) IV  4 mg Intravenous 4 times per day  . pantoprazole  40 mg Oral QAC breakfast  . sodium chloride  1,000 mL Intravenous Once  . sodium chloride  3 mL Intravenous Q12H   Continuous Infusions: . dextrose 5 % and 0.9 % NaCl with KCl 20 mEq/L 50 mL/hr at 08/27/15 0346     Time spent: 25 minutes    Marquavius Scaife  [facility] Pager [phone]. If 7PM-7AM, please contact night-coverage at [email or web address], password [email or web address] 08/27/2015, 1:15 PM  LOS: 7 days

## 2015-08-27 NOTE — Consult Note (Signed)
Referring Provider: [facility] Primary Care Physician:  Mathews Argyle, MD Primary Gastroenterologist:  unassigned  Reason for Consultation:  Nausea, abnormal LFTs    HPI: Kim Donovan is a 82 y.o. female is a pleasant 82 year old female who recently was diagnosed with metastatic renal cell carcinoma with liver metastasis.patient reports she had 2-3 months of right-sided abdominal flank pain and had testing that showed an abnormal right kidney with retroperitoneal adenopathy. She had a renal biopsywith IR on December 23. and is following with Dr. Benay Spice  of oncology. She was started on chemotherapy with  pazopanib. She also has a past medical history polymyalgia rheumatica, glaucoma, hypertension, status post aVR, chronic diastolic CHF, and hyperlipidemia. Jasdeep presented to the emergency room in January 1 with complaints of fever, chills, nausea, vomiting, and anorexia 2 days duration. A CT of the abdomen and pelvis performed in the emergency room showed a large right kidney mass and multiple metastatic disease in the liver as well as a fluid collection extending from the antrum and stomach to the hepatic flexure of the colon measuring 7 x 5.7 cm. She was febrile in the emergency room, tachycardic, and tachypnea can have an elevated white blood count of 14.3. Sodium noted to have a mild transaminitis and hyponatremia. She was evaluated by [facility] and felt to not be a surgical candidate. She had a repeat CT on January 5 and was noted to have an enlarging right pleural effusion and worsening left lower lobe infiltrate. Stable hepatic metastatic lesions. Stable intrahepatic biliary dilatation with an abrupt cutoff of the common bile duct most likely due to a tight stricture or cholangiocarcinoma. Diffusely infiltrated right kidney. Stable complex fluid collection in the small bowel mesentery. Slight increase in pelvic fluid. Patient has also been noted over the past several days to have an  elevation of her LFTs. 3 days ago her total bili was 0.6, alkaline phosphatase 111, ALT 32, AST 37. This morning her total bili is 1.7, alkaline phosphatase 352, ALT 98, AST 149. She has continued to experience significant nausea and vomiting. She had a fever of 100.3 earlier this morning, and 100.9 last evening.she has also been evaluated by interventional radiology today for consideration of aspiration and possible drainage of the mid abdominal fluid collection.   Past Medical History  Diagnosis Date  . Hyperlipidemia   . Osteoarthritis   . Osteoporosis/osteopenia increased risk   . Glaucoma   . Aortic stenosis     myoview neg ischemia 11/2006  ---- echo 11/10: mean AVA gradient~47   ---- s/p bioprosthetic AVR 12/2009  . Polymyalgia rheumatica ([place]) 11/05/2010  . PVD (peripheral vascular disease) ([facility])     left leg swells  . Hypertension     pt. states does not have HTN, no meds    Past Surgical History  Procedure Laterality Date  . Appendectomy    . Tonsillectomy    . Cholecystectomy  1958  . Cardiac valve replacement  May 2011    aortic valve  Por[facility]  . Carpal tunnel release  12/2010    left hand  . Cataract/glaucoma surgery  11/2010    [facility]  . Breast surgery  1985    [facility]  . Abdominal hysterectomy      for fibroids  . Mastectomy      was only for removal of cysts, benign  . Eye surgery  01/2011    for glaucoma  . Carpal tunnel release  08/29/2011    Procedure: CARPAL TUNNEL  RELEASE;  Surgeon: Cammie Sickle., MD;  Location: [place];  Service: Orthopedics;  Laterality: Right;    Prior to Admission medications   Medication Sig Start Date End Date Taking? Authorizing Provider  ALPRAZolam (XANAX) 0.25 MG tablet Take 1 tablet (0.25 mg total) by mouth at bedtime as needed for anxiety. 08/06/15  Yes Ladell Pier, MD  aspirin 81 MG tablet Take 81 mg by mouth daily.     Yes Historical Provider, MD  Calcium Citrate-Vitamin D 250-200 MG-UNIT TABS  Take 2 tablets by mouth daily.    Yes Historical Provider, MD  Diphenhydramine-APAP, sleep, (TYLENOL PM EXTRA STRENGTH PO) Take 1 tablet by mouth at bedtime as needed (pain/sleep).    Yes Historical Provider, MD  dorzolamide-timolol (COSOPT) 22.3-6.8 MG/ML ophthalmic solution Place 1 drop into the left eye 2 (two) times daily.    Yes Historical Provider, MD  furosemide (LASIX) 20 MG tablet Take 1 tablet (20 mg total) by mouth daily. 05/15/15  Yes Larey Dresser, MD  Multiple Vitamins-Minerals (CENTRUM SILVER ULTRA WOMENS PO) Take 1 tablet by mouth daily.     Yes Historical Provider, MD  Omega-3 Fatty Acids (FISH OIL) 1000 MG CAPS Take 1 capsule by mouth daily. Reported on 08/06/2015   Yes Historical Provider, MD  pazopanib (VOTRIENT) 200 MG tablet Take 4 tablets (800 mg total) by mouth daily. Take on an empty stomach. 08/14/15  Yes Ladell Pier, MD  PREDNISONE, PAK, PO Take 2.5 mg by mouth every other day.    Yes Historical Provider, MD  ezetimibe (ZETIA) 10 MG tablet Take 1 tablet (10 mg total) by mouth daily. 05/12/14   Jolaine Artist, MD  potassium chloride (K-DUR) 10 MEQ tablet Take 1 tablet (10 mEq total) by mouth daily. Patient not taking: Reported on 08/14/2015 05/15/15   Larey Dresser, MD    Current Facility-Administered Medications  Medication Dose Route Frequency Provider Last Rate Last Dose  . acetaminophen (TYLENOL) tablet 650 mg  650 mg Oral Q6H PRN Theressa Millard, MD       Or  . acetaminophen (TYLENOL) suppository 650 mg  650 mg Rectal Q6H PRN Theressa Millard, MD   650 mg at 08/27/15 0014  . ALPRAZolam Duanne Moron) tablet 0.25 mg  0.25 mg Oral QHS PRN Simbiso Ranga, MD   0.25 mg at 08/25/15 2233  . antiseptic oral rinse (CPC / CETYLPYRIDINIUM CHLORIDE 0.05%) solution 7 mL  7 mL Mouth Rinse BID Theressa Millard, MD   7 mL at 08/27/15 1100  . dextrose 5 % and 0.9 % NaCl with KCl 20 mEq/L infusion   Intravenous Continuous Simbiso Ranga, MD 50 mL/hr at 08/27/15 1410 50 mL at  08/27/15 1410  . guaiFENesin-dextromethorphan (ROBITUSSIN DM) 100-10 MG/5ML syrup 5 mL  5 mL Oral Q4H PRN Ritta Slot, NP   5 mL at 08/25/15 0131  . hydrALAZINE (APRESOLINE) injection 2 mg  2 mg Intravenous Q6H PRN Theressa Millard, MD      . HYDROmorphone (DILAUDID) injection 0.5-1 mg  0.5-1 mg Intravenous Q3H PRN Theressa Millard, MD      . iohexol (OMNIPAQUE) 300 MG/ML solution 50 mL  50 mL Oral Once PRN Simbiso Ranga, MD      . lactose free nutrition (BOOST PLUS) liquid 237 mL  237 mL Oral TID WC Satira Anis Ward, RD   Stopped at 08/27/15 0800  . ondansetron (ZOFRAN) injection 4 mg  4 mg Intravenous 4 times per  day April Palumbo, MD   4 mg at 08/27/15 1300  . ondansetron (ZOFRAN) tablet 4 mg  4 mg Oral Q6H PRN Theressa Millard, MD       Or  . ondansetron (ZOFRAN) injection 4 mg  4 mg Intravenous Q6H PRN Theressa Millard, MD      . pantoprazole (PROTONIX) EC tablet 40 mg  40 mg Oral QAC breakfast Thomes Lolling, RPH   40 mg at 08/27/15 [id number]  . promethazine (PHENERGAN) injection 12.5 mg  12.5 mg Intravenous Q6H PRN April Palumbo, MD   12.5 mg at 08/27/15 0807  . sodium chloride 0.9 % bolus 1,000 mL  1,000 mL Intravenous Once April Palumbo, MD   1,000 mL at 08/20/15 0609  . sodium chloride 0.9 % injection 3 mL  3 mL Intravenous Q12H Theressa Millard, MD   3 mL at 08/27/15 1000    Allergies as of 08/19/2015 - Review Complete 08/19/2015  Allergen Reaction Noted  . Tramadol Rash     Family History  Problem Relation Age of Onset  . Deep vein thrombosis Mother   . Heart attack Father   . Stroke Father   . Osteoporosis      aunt    Social History   Social History  . Marital Status: Married    Spouse Name: N/A  . Number of Children: N/A  . Years of Education: N/A   Occupational History  . Not on file.   Social History Main Topics  . Smoking status: Former Smoker -- 0.50 packs/day    Types: Cigarettes    Quit date: 08/18/1978  . Smokeless tobacco: Not on file  .  Alcohol Use: No  . Drug Use: Not on file  . Sexual Activity: Not on file   Other Topics Concern  . Not on file   Social History Narrative   Married   Walks   HH of 2   No pets   Social ocass etoh             Review of Systems: Per history of present illness, otherwise negative.  Physical Exam: Vital signs in last 24 hours: Temp:  [97.4 F (36.3 C)-100.9 F (38.3 C)] 97.7 F (36.5 C) (01/09 1357) Pulse Rate:  [100-117] 107 (01/09 1357) Resp:  [17-20] 20 (01/09 1357) BP: (129-151)/(86-101) 138/89 mmHg (01/09 1357) SpO2:  [95 %-98 %] 98 % (01/09 1357) Last BM Date: 08/25/15 General:   Alert,  Well-developed, well-nourished, pleasant and cooperative in NAD Head:  Normocephalic and atraumatic. Eyes:  Sclera clear, no icterus. Conjunctiva pink. Ears:  Normal auditory acuity. Nose:  No deformity, discharge,  or lesions. Mouth:  No deformity or lesions.   Neck:  Supple; no masses or thyromegaly. Lungs: diminished breath sounds at bases with scattered rhonchi throughout.  Heart:  Regular rate and rhythm; ttachycardic Abdomen:  Soft,mild right upper quadrant tenderness to palpation with no rebound or guarding, BS active,nonpalp mass or hsm.   Rectal:  Deferred  Msk:  Symmetrical without gross deformities. . Pulses:  Normal pulses noted. Extremities:  Without clubbing or edema. Neurologic:  Alert and  oriented x4;  grossly normal neurologically. Skin: Intact without significant lesions or rashes.. Psych:  Alert and cooperative. Normal mood and affect.  Intake/Output from previous day: 01/08 0701 - 01/09 0700 In: 1315.8 [P.O.:500; I.V.:815.8] Out: 1 [Urine:1] Intake/Output this shift: Total I/O In: -  Out: 200 [Other:200]  Lab Results:  Recent Labs  08/25/15 0430 08/26/15 0405 08/27/15 0540  WBC 10.8* 7.7 10.1  HGB 12.5 12.1 12.7  HCT 38.1 36.6 38.7  PLT 336 331 342   BMET  Recent Labs  08/25/15 0430 08/26/15 0405 08/27/15 0540  NA 137 138 138  K 4.0  4.0 4.0  CL 101 100* 98*  CO2 26 29 31   GLUCOSE 110* 101* 122*  BUN 12 17 19   CREATININE 1.03* 1.09* 1.16*  CALCIUM 8.6* 8.8* 9.0   LFT  Recent Labs  08/27/15 0540  PROT 6.9  ALBUMIN 2.7*  AST 149*  ALT 98*  ALKPHOS 352*  BILITOT 1.7*   Studies/Results:     Show images for CT Abdomen Pelvis W Contrast     Addendum     Kalman Jewels, MD  Ludwig Clarks Aug 24, 2015 11:29:18 AM EST     ADDENDUM REPORT: 08/24/2015 11:26 ADDENDUM: I reviewed the patient's prior CT scan from 2008. This study demonstrated mild intrahepatic biliary dilatation and moderate extra hepatic biliary dilatation with the common bile duct measuring approximately 11 mm. The findings on the recent CT scans are most likely due to progressive intrahepatic biliary dilatation due to the patient's age and prior cholecystectomy. There may be some extrinsic compression of the intrapancreatic portion of the common bile duct due to the right renal mass and perinephric fluid. No obvious intraductal mass, pancreatic mass or adenopathy. Patient's liver function studies are normal. Attention on future scans is suggested. Electronically Signed  By: Marijo Sanes M.D.  On: 08/24/2015 11:26     Study Result     CLINICAL DATA: Followup diverticulosis. Improving abdominal pain.  EXAM: CT ABDOMEN AND PELVIS WITH CONTRAST  TECHNIQUE: Multidetector CT imaging of the abdomen and pelvis was performed using the standard protocol following bolus administration of intravenous contrast.  CONTRAST: 14mL OMNIPAQUE IOHEXOL 300 MG/ML SOLN  COMPARISON: Prior CT scans 08/20/2015 and 08/08/2015. Prior MRI examination 08/03/2015  FINDINGS: Lower chest: A small to moderate-sized right pleural effusion is noted. This has enlarged since the prior CT scan. There is overlying atelectasis. Patchy left lower lobe infiltrate and small left effusion. The heart is mildly enlarged. Prosthetic aortic valve is noted.  Coronary artery calcifications are again demonstrated.  Hepatobiliary: Stable significant intrahepatic biliary dilatation without abrupt cut off afford enters the pancreatic head. Findings could be due to a tight stricture or biliary tumor. The common bile duct in the pancreatic head is normal in caliber in the main pancreatic duct is normal in caliber.  Again demonstrated are stable hepatic metastatic lesions. The portal and hepatic veins are patent.  Pancreas: No mass or inflammation.  Spleen: Normal size. No focal lesions.  Adrenals/Urinary Tract: Stable diffusely infiltrated right kidney, most likely by tumor. Chronic infection is also possible. Tumor/clot noted in a dilated right renal vein.  Stomach/Bowel: The stomach, duodenum, small bowel and colon are grossly normal.  Vascular/Lymphatic: Tortuosity, ectasia and calcification of the thoracic aorta. The branch vessels are patent. Stable mesenteric and retroperitoneal lymph nodes.  Other: Stable complex fluid collection in the mesentery surrounded by small bowel loops and vessels. There is also moderate free pelvic fluid. The bladder appears normal. No pelvic mass or adenopathy. No inguinal mass or adenopathy.  Musculoskeletal: No significant bony findings. Scoliosis and degenerative changes noted in the spine.  IMPRESSION: 1. Overall stable CT findings in the abdomen/pelvis. 2. Enlarging right pleural effusion and worsening left lower lobe infiltrate. 3. Stable hepatic metastatic lesions. 4. Stable intrahepatic biliary dilatation with an abrupt cut off of the common bile duct most likely  due to a tight stricture or cholangiocarcinoma. 5. Diffusely infiltrated right kidney. This could be an infiltrating renal cell carcinoma or transitional cell carcinoma. 6. Stable complex fluid collection in the small bowel mesenteric. Slight increase in pelvic fluid.  Electronically Signed: By: Marijo Sanes  M.D. On: 08/23/2015 17:41   Pelvis W Contrast   Status: Final result       PACS Images     Show images for CT Abdomen Pelvis W Contrast     Study Result     CLINICAL DATA: Acute onset of nausea, vomiting and fever. Recent liver biopsy on August 10, 2015. Initial encounter.  EXAM: CT ABDOMEN AND PELVIS WITH CONTRAST  TECHNIQUE: Multidetector CT imaging of the abdomen and pelvis was performed using the standard protocol following bolus administration of intravenous contrast.  CONTRAST: 61mL OMNIPAQUE IOHEXOL 300 MG/ML SOLN  COMPARISON: CT of the chest, abdomen and pelvis performed 08/08/2015, and MRI of the abdomen performed 08/03/2015  FINDINGS: A trace right-sided pleural fluid is noted, new from the prior study, possibly reactive in nature. Scattered coronary artery calcifications are noted.  The known multiple metastatic lesions within the liver are better characterized on prior CT; associated perfusion anomalies are not well assessed given the phase of contrast enhancement. These appear to be relatively stable in size.  A large mass is again noted occupying much of the right kidney, with mild sparing of the lower pole of the right kidney. The degree of infiltration of the retroperitoneum anterior to the lumbar spine appears mildly worsened, and the mass thus measures approximately 10.5 x 6.4 x 9.2 cm. Per correlation with recent biopsy results, this most likely reflects urothelial carcinoma.  Surrounding soft tissue inflammation and trace fluid is seen. There is new vague fluid attenuation tracking adjacent to the antrum of the stomach and hepatic flexure of the colon, measuring approximately 7.0 x 5.7 x 5.2 cm, which still contains several foci of fat. Given the patient's leukocytosis, mild apparent peripheral enhancement, and the relatively low attenuation of this collection, this is concerning for evolving abscess infiltrating the  colonic mesentery. It is likely not sufficiently organized to be fully drainable at this time.  There is adjacent wall thickening involving the hepatic flexure of the colon, and surrounding soft tissue inflammation is noted about the omentum.  The patient is status post cholecystectomy. Prominence of the common bile duct and intrahepatic biliary ducts is relatively stable from the prior study. The spleen is unremarkable in appearance. The pancreas and adrenal glands are grossly unremarkable, though mass infiltrates about portions of the right adrenal gland.  The left kidney is unremarkable in appearance. On the left side, there is no evidence of hydronephrosis. No renal or ureteral stones are seen.  The small bowel is grossly unremarkable. The duodenum is decompressed and passes near the collection of fluid. The stomach is largely distended with contrast and air, and is grossly unremarkable. No acute vascular abnormalities are seen. There is scattered calcification along the abdominal aorta and its branches.  The patient is status post appendectomy. Scattered diverticulosis is noted along the sigmoid colon, without evidence of diverticulitis. There is unusual prominence of varices within the retroperitoneum and pelvis.  The bladder is mildly distended and grossly unremarkable. The patient is status post hysterectomy. No suspicious adnexal masses are seen. No inguinal lymphadenopathy is seen.  No acute osseous abnormalities are identified. Multilevel vacuum phenomenon is noted along the lumbar spine, with mild grade 1 anterolisthesis of L4 on L5, reflecting underlying facet  disease.  IMPRESSION: 1. New vague fluid collection tracking adjacent to the antrum of the stomach and hepatic flexure of the colon, measuring 7.0 x 5.7 x 5.2 cm, which still contains several foci of fat. Given the patient's leukocytosis, mild apparent peripheral enhancement, and the relatively low  attenuation of this collection, this is concerning for evolving abscess infiltrating the colonic mesentery. It is likely not sufficiently organized to be fully drainable at this time. 2. Adjacent associated wall thickening involving the hepatic flexure of the colon, and surrounding soft tissue inflammation about the omentum. 3. Trace right-sided pleural fluid, new from the prior study, possibly reactive in nature. 4. Large mass again noted occupying much of the right kidney, with mild sparing of the lower pole of the right kidney. There appears to be mildly worsened infiltration of the retroperitoneum anterior to the lumbar spine, and partial infiltration about the right adrenal gland; the mass thus measures approximately 10.5 x 6.4 x 9.2 cm. Per correlation with recent biopsy results, this most likely reflects urothelial carcinoma. 5. Known multiple metastatic lesions within the liver are better characterized on prior CT, with associated perfusion anomalies, not well assessed on this study due to the phase of contrast enhancement. Metastatic lesions appear relatively stable from the recent prior CT. 6. Scattered coronary artery calcifications seen. 7. Scattered calcification along the abdominal aorta and its branches. 8. Scattered diverticulosis along the sigmoid colon, without evidence of diverticulitis. 9. Unusual prominence of varices within the retroperitoneum and pelvis. 10. Mild degenerative change noted along the lumbar spine.  These results were called by telephone at the time of interpretation on 08/20/2015 at 2:32 am to Dr. Veatrice Kells, who verbally acknowledged these results.   Electronically Signed  By: Garald Balding M.D.  On: 08/20/2015 02:34   Images     Show images for MR Abdomen W Wo Contrast     Study Result     CLINICAL DATA: Ultrasound demonstrating lack of right-sided renal sinus fat. Possible mass. Right upper quadrant  pain.  EXAM: MRI ABDOMEN WITHOUT AND WITH CONTRAST  TECHNIQUE: Multiplanar multisequence MR imaging of the abdomen was performed both before and after the administration of intravenous contrast.  CONTRAST: 22mL MULTIHANCE GADOBENATE DIMEGLUMINE 529 MG/ML IV SOLN  COMPARISON: Ultrasound of 07/30/2015. CT of 01/07/2007.  FINDINGS: Mild motion degradation throughout.  Lower chest: Cardiomegaly. Median sternotomy. Small right pleural effusion.  Hepatobiliary: Multiple right-sided liver lesions. Subcapsular posterior right hepatic lobe 1.3 cm is T2 hyperintense and hypo enhancing, including on image 28/series 4. A subcapsular anterior segment right liver lobe 1.5 cm lesion is identified on image 44/series 903.  Cholecystectomy. The intrahepatic ducts are mildly prominent. The common duct measures up to 1.5 cm in the porta hepatis on image 12/ series 7. Tapers to 8 mm in thepancreatic head. Suspect mass-effect secondary to the right-sided retroperitoneal process.  Pancreas: Normal, without mass or ductal dilatation.  Spleen: Normal in size, without focal abnormality.  Adrenals/Urinary Tract: Normal left adrenal gland. Right adrenal not well evaluated. A too small to characterize left renal lesion.  Markedly abnormal appearance of the right kidney. Mildly enlarged, with dilated renal collecting systems and renal pelvis. Possible renal pelvic stone on image 24/series 7. Areas of precontrast T1 hyperintensity within are suspicious for hemorrhage. Example series 900. Markedly diminished post-contrast enhancement throughout the right kidney. Surrounding perinephric edema and cyst early post-contrast hyperemia in the adjacent right hepatic lobe. Example image 36/ series 901.  Stomach/Bowel: Mild gastric distension. Normal caliber of abdominal bowel  loops.  Vascular/Lymphatic: Normal caliber of the aorta and branch vessels. Suspect extension of the right renal  process (likely tumor) into the IVC, including on image 14 of series 4. The more inferior IVC is also hypo enhancing on image 44/ series 904. Limited evaluation for retroperitoneal adenopathy. Soft tissue fullness in the aortocaval space is suspicious on image 41/ series 904.  Other: No abdominal ascites.  Musculoskeletal: S-shaped thoracolumbar spine curvature.  IMPRESSION: 1. Markedly abnormal appearance of the right kidney. Enlarged with marked hypo enhancement. Extension of this process medially into the IVC with probable adjacent retroperitoneal adenopathy. Findings are suspicious for infiltrative renal neoplasm with venous extension. Given concurrent caliectasis and possible obstructing renal pelvic stone, an infectious process such as xanthogranulomatous pyelonephritis is possible but felt less likely. Given motion on current exam, consider further evaluation with hematuria protocol abdominal pelvic pre and post contrast CT. These results will be called to the ordering clinician or representative by the Radiologist Assistant, and communication documented in the PACS or zVision Dashboard. 2. Right-sided liver lesions which are suspicious for metastatic disease, presuming the renal process is neoplasm. Extension of infection felt less likely. 3. Small right pleural effusion. 4. Cholecystectomy with biliary duct dilatation. Question mass effect by the right-sided retroperitoneal process. Recommend attention on follow-up.   Electronically Signed  By: Abigail Miyamoto M.D.  On: 08/03/2015 13:43     IMPRESSION/PLAN: 82 year old female with new diagnosis metastatic renal cell carcinoma status post liver lesion biopsy on December 23, admitted January 1 with persistent nausea and vomiting. Patient has been evaluated by interventional radiology due to fluid collection along the antrum of the stomach and hepatic flexure colon and is currently being consideration of aspiration.  CT also shows dilation of the CBD with abrupt cutoff and patient has had rising alkaline phosphatase, bili, transaminases in the past 3 days. She continues to have right upper quadrant pain with nausea and vomiting. Patient with her plan is to proceed with ERCP with possible stenting.    Maelle Sheaffer, Deloris Ping 08/27/2015,  Pager [phone]  Mon-Fri 8a-5p [phone] after 5p, weekends, holidays

## 2015-08-27 NOTE — Clinical Social Work Note (Signed)
Clinical Social Work Assessment  Patient Details  Name: Kim Donovan MRN: [id number] Date of Birth: May 10, 1929  Date of referral:  08/27/15               Reason for consult:  Facility Placement                Permission sought to share information with:  Facility Art therapist granted to share information::  Yes, Verbal Permission Granted  Name::        Agency::     Relationship::     Contact Information:     Housing/Transportation Living arrangements for the past 2 months:  [place] of Information:  Patient, Adult Children Patient Interpreter Needed:  None Criminal Activity/Legal Involvement Pertinent to Current Situation/Hospitalization:  No - Comment as needed Significant Relationships:  Adult Children, Spouse Lives with:  Self Do you feel safe going back to the place where you live?  No Need for family participation in patient care:  Yes (Comment)  Care giving concerns:  CSW received consult that patient was admitted from [place] at [place].    Social Worker assessment / plan:  CSW received call from [facility] that patient/family is requesting to go to SNF at [facility] upon discharge before returning to [place].   Employment status:  Retired Nurse, adult PT Recommendations:  [place] / Referral to community resources:  [place]  Patient/Family's Response to care:  CSW confirmed with Kim Donovan at [facility] that they are holding a bed for her at the [facility]" section (SNF).   Patient/Family's Understanding of and Emotional Response to Diagnosis, Current Treatment, and Prognosis:    Emotional Assessment Appearance:    Attitude/Demeanor/Rapport:    Affect (typically observed):    Orientation:    Alcohol / Substance use:    Psych involvement (Current and /or in the community):     Discharge Needs  Concerns to be addressed:    Readmission within  the last 30 days:    Current discharge risk:    Barriers to Discharge:      Kim Brooking, LCSW 08/27/2015, 2:14 PM

## 2015-08-27 NOTE — Progress Notes (Addendum)
IP PROGRESS NOTE  Subjective:   She reports multiple episodes of nausea and vomiting last night. No dyspnea.  Objective: Vital signs in last 24 hours: Blood pressure 136/91, pulse 100, temperature 97.7 F (36.5 C), temperature source Oral, resp. rate 18, height _0  (1.676 m), weight 136 lb 0.4 oz (61.7 kg), SpO2 97 %.  Intake/Output from previous day: 01/08 0701 - 01/09 0700 In: 1315.8 [P.O.:500; I.V.:815.8] Out: 1 [Urine:1]  Physical Exam: Lungs: Scattered rhonchi at the left posterior chest, no respiratory distress Cardiac: Regular rate and rhythm  Abdomen:  No hepatomegaly, no mass, nontender Vascular: No leg edema     Lab Results:  Recent Labs  08/26/15 0405 08/27/15 0540  WBC 7.7 10.1  HGB 12.1 12.7  HCT 36.6 38.7  PLT 331 342    BMET  Recent Labs  08/26/15 0405 08/27/15 0540  NA 138 138  K 4.0 4.0  CL 100* 98*  CO2 29 31  GLUCOSE 101* 122*  BUN 17 19  CREATININE 1.09* 1.16*  CALCIUM 8.8* 9.0   alk phosphatase 2052, AST 149, ALT 98, bilirubin 1.7  Studies/Results: No results found.  Medications: I have reviewed the patient's current medications.  Assessment/Plan: 1. Right renal mass  MRI 08/03/2015 with multiple suspicious liver lesions, replacement of the right kidney consistent with an infiltrative neoplasm, probable rectoperineal adenopathy, extension of tumor into the IVC, small right pleural effusion  Staging CTs 08/08/2015 with no lung metastases, infiltrative right renal mass extending into the right renal vein and pararenal space, no lymphadenopathy, liver metastases  Ultrasound-guided biopsy of a right liver lesion 08/10/2015 with the preliminary pathology consistent with metastatic renal cell carcinoma, additional immunohistochemical histochemical stains most consistent with a poorly differentiated urothelial carcinoma  Initiation of pazopanib 08/17/2015, taken for 3 days 2. Right abdomen/flank discomfort secondary to #1  3.  Family history of renal cell cancer  4. Anorexia/weight loss secondary to #1  5. Status post aortic valve replacement  6.Polymyalgia rheumatica- [place] on hold  7.    Admission 08/20/2015 with nausea/vomiting and fever - CT abdomen/pelvis revealed new fluid attenuation surrounding the gastric antrum and hepatic flexure, stable fluid collection on a repeat CT 08/23/2015  8.    Probable aspiration pneumonia January 2017   She continues to have nausea and vomiting. Her symptoms may be related to the primary renal mass or potentially biliary obstruction. I will review the case further in radiology today.  I discussed the case with pathology again today. They favor the primary tumor representing a urothelial carcinoma. We will consider gemcitabine/carboplatin if her performance status improves.   LOS: 7 days   [place]  08/27/2015, 10:48 AM  I reviewed the CT images again in radiology today. The biliary dilatation has progressed from remote CT images. It is possible the tumor is causing obstruction of the biliary system and this may explain the fever/nausea and rise in the liver enzymes.  The fluid attenuation collection in the right upper abdomen may be related to an abscess.  Recommendations:  1. Consult GI to evaluate the nausea and biliary obstruction 2. Consult interventional radiology to drain the right upper abdomen fluid collection  Julieanne Manson, M.D.

## 2015-08-27 NOTE — Progress Notes (Addendum)
NGT placed successfully, left nare. Immediate output 900 ml. Order for KUB placed.Provider aware of placement and order for kub, no additional orders at this time.

## 2015-08-27 NOTE — NC FL2 (Signed)
[place] MEDICAID FL2 LEVEL OF CARE SCREENING TOOL     IDENTIFICATION  Patient Name: Kim Donovan Birthdate: August 20, 1928 Sex: female Admission Date (Current Location): 08/19/2015  [facility] and [place] Number:  Herbalist and Address:  [facility],  [place] [address] [place]      Provider Number: [id number]  Attending Physician Name and Address:  Nat Math, MD  Relative Name and Phone Number:       Current Level of Care: Hospital Recommended Level of Care: [place] Prior Approval Number:    Date Approved/Denied:   PASRR Number: [id number]  Discharge Plan: SNF    Current Diagnoses: Patient Active Problem List   Diagnosis Date Noted  . Transaminitis 08/27/2015  . Fever, unspecified 08/27/2015  . Elevated lipase 08/26/2015  . CAP (community acquired pneumonia) 08/22/2015  . Sepsis ([place]) 08/20/2015  . AKI (acute kidney injury) ([place]) 08/20/2015  . Nausea and vomiting 08/20/2015  . Hyponatremia 08/20/2015  . Essential hypertension 08/20/2015  . Intraabdominal fluid collection 08/20/2015  . Hepatic metastases ([place])   . Renal mass   . Liver masses   . Renal mass, right 08/06/2015  . Chronic diastolic CHF (congestive heart failure) ([place]) 06/15/2015  . S/P AVR (aortic valve replacement) 05/04/2014  . Increased urinary frequency 06/09/2011  . Fatigue 06/09/2011  . Polymyalgia rheumatica ([place]) 11/05/2010  . Reactive depression (situational) 11/05/2010  . Aortic stenosis   . PAIN IN JOINT, SITE UNSPECIFIED 07/02/2010  . GLAUCOMA 05/29/2009  . HYPERLIPIDEMIA 05/06/2007  . HYPERTENSION 05/06/2007  . VENOUS INSUFFICIENCY 05/06/2007  . OSTEOARTHRITIS 05/06/2007  . Osteoporosis, unspecified 04/30/2007    Orientation RESPIRATION BLADDER Height & Weight    Self, Time, Situation, Place  Normal Continent 5\' 6"  (167.6 cm) 136 lbs.  BEHAVIORAL SYMPTOMS/MOOD NEUROLOGICAL BOWEL NUTRITION STATUS      Continent  (NPO at this  time - see d/c summary for diet at discharge)  [place] Skin   Limited Assist Verbally Normal                       Personal Care Assistance Level of Assistance  Bathing, Feeding, Dressing Bathing Assistance: Limited assistance Feeding assistance: Limited assistance Dressing Assistance: Limited assistance     Functional Limitations [place]  PT (By licensed PT), OT (By licensed OT)     PT Frequency: 5 OT Frequency: 5            Contractures      Additional Factors Info  Code Status, Allergies Code Status Info: DNR Allergies Info: Tramadol           Current Medications (08/27/2015):  This is the current hospital active medication list Current Facility-Administered Medications  Medication Dose Route Frequency Provider Last Rate Last Dose  . acetaminophen (TYLENOL) tablet 650 mg  650 mg Oral Q6H PRN Theressa Millard, MD       Or  . acetaminophen (TYLENOL) suppository 650 mg  650 mg Rectal Q6H PRN Theressa Millard, MD   650 mg at 08/27/15 0014  . ALPRAZolam Duanne Moron) tablet 0.25 mg  0.25 mg Oral QHS PRN Simbiso Ranga, MD   0.25 mg at 08/25/15 2233  . antiseptic oral rinse (CPC / CETYLPYRIDINIUM CHLORIDE 0.05%) solution 7 mL  7 mL Mouth Rinse BID Theressa Millard, MD   7 mL at 08/26/15 1000  .  dextrose 5 % and 0.9 % NaCl with KCl 20 mEq/L infusion   Intravenous Continuous Simbiso Ranga, MD 50 mL/hr at 08/27/15 1410 50 mL at 08/27/15 1410  . guaiFENesin-dextromethorphan (ROBITUSSIN DM) 100-10 MG/5ML syrup 5 mL  5 mL Oral Q4H PRN Ritta Slot, NP   5 mL at 08/25/15 0131  . hydrALAZINE (APRESOLINE) injection 2 mg  2 mg Intravenous Q6H PRN Theressa Millard, MD      . HYDROmorphone (DILAUDID) injection 0.5-1 mg  0.5-1 mg Intravenous Q3H PRN Theressa Millard, MD      . iohexol (OMNIPAQUE) 300 MG/ML solution 50 mL  50 mL Oral Once PRN Simbiso Ranga, MD      . lactose free nutrition (BOOST PLUS)  liquid 237 mL  237 mL Oral TID WC Satira Anis Ward, RD   Stopped at 08/27/15 0800  . ondansetron (ZOFRAN) injection 4 mg  4 mg Intravenous 4 times per day April Palumbo, MD   4 mg at 08/27/15 1300  . ondansetron (ZOFRAN) tablet 4 mg  4 mg Oral Q6H PRN Theressa Millard, MD       Or  . ondansetron (ZOFRAN) injection 4 mg  4 mg Intravenous Q6H PRN Theressa Millard, MD      . pantoprazole (PROTONIX) EC tablet 40 mg  40 mg Oral QAC breakfast Thomes Lolling, RPH   40 mg at 08/27/15 [id number]  . promethazine (PHENERGAN) injection 12.5 mg  12.5 mg Intravenous Q6H PRN April Palumbo, MD   12.5 mg at 08/27/15 0807  . sodium chloride 0.9 % bolus 1,000 mL  1,000 mL Intravenous Once April Palumbo, MD   1,000 mL at 08/20/15 0609  . sodium chloride 0.9 % injection 3 mL  3 mL Intravenous Q12H Theressa Millard, MD   3 mL at 08/26/15 1055     Discharge Medications: Please see discharge summary for a list of discharge medications.  Relevant Imaging Results:  Relevant Lab Results:   Additional Information SSN: [id number]  [place] Brooking, LCSW

## 2015-08-28 ENCOUNTER — Encounter (HOSPITAL_COMMUNITY): Payer: Self-pay | Admitting: Physician Assistant

## 2015-08-28 ENCOUNTER — Inpatient Hospital Stay (HOSPITAL_COMMUNITY): Payer: Medicare Other

## 2015-08-28 ENCOUNTER — Encounter (HOSPITAL_COMMUNITY)
Admission: EM | Disposition: A | Discharge status: Skilled Nursing Facility | Payer: Self-pay | Source: Home / Self Care | Attending: Internal Medicine

## 2015-08-28 ENCOUNTER — Inpatient Hospital Stay (HOSPITAL_COMMUNITY): Payer: Medicare Other | Admitting: Anesthesiology

## 2015-08-28 DIAGNOSIS — R933 Abnormal findings on diagnostic imaging of other parts of digestive tract: Secondary | ICD-10-CM | POA: Insufficient documentation

## 2015-08-28 DIAGNOSIS — K311 Adult hypertrophic pyloric stenosis: Secondary | ICD-10-CM | POA: Insufficient documentation

## 2015-08-28 HISTORY — PX: ESOPHAGOGASTRODUODENOSCOPY (EGD) WITH PROPOFOL: SHX5813

## 2015-08-28 LAB — URINE MICROSCOPIC-ADD ON

## 2015-08-28 LAB — CBC WITH DIFFERENTIAL/PLATELET
BASOS ABS: 0 10*3/uL (ref 0.0–0.1)
Basophils Relative: 0 %
EOS ABS: 0.2 10*3/uL (ref 0.0–0.7)
EOS PCT: 1 %
HCT: 39.9 % (ref 36.0–46.0)
Hemoglobin: 12.9 g/dL (ref 12.0–15.0)
LYMPHS PCT: 18 %
Lymphs Abs: 2 10*3/uL (ref 0.7–4.0)
MCH: 29.5 pg (ref 26.0–34.0)
MCHC: 32.3 g/dL (ref 30.0–36.0)
MCV: 91.3 fL (ref 78.0–100.0)
MONO ABS: 0.8 10*3/uL (ref 0.1–1.0)
Monocytes Relative: 7 %
Neutro Abs: 8.2 10*3/uL — ABNORMAL HIGH (ref 1.7–7.7)
Neutrophils Relative %: 74 %
PLATELETS: 353 10*3/uL (ref 150–400)
RBC: 4.37 MIL/uL (ref 3.87–5.11)
RDW: 14.1 % (ref 11.5–15.5)
WBC: 11.3 10*3/uL — AB (ref 4.0–10.5)

## 2015-08-28 LAB — URINALYSIS, ROUTINE W REFLEX MICROSCOPIC
GLUCOSE, UA: NEGATIVE mg/dL
KETONES UR: NEGATIVE mg/dL
Leukocytes, UA: NEGATIVE
Nitrite: NEGATIVE
PH: 8 (ref 5.0–8.0)
Protein, ur: >300 mg/dL — AB
SPECIFIC GRAVITY, URINE: 1.036 — AB (ref 1.005–1.030)

## 2015-08-28 LAB — COMPREHENSIVE METABOLIC PANEL
ALT: 199 U/L — AB (ref 14–54)
AST: 241 U/L — AB (ref 15–41)
Albumin: 2.8 g/dL — ABNORMAL LOW (ref 3.5–5.0)
Alkaline Phosphatase: 412 U/L — ABNORMAL HIGH (ref 38–126)
Anion gap: 12 (ref 5–15)
BUN: 19 mg/dL (ref 6–20)
CHLORIDE: 99 mmol/L — AB (ref 101–111)
CO2: 29 mmol/L (ref 22–32)
Calcium: 9 mg/dL (ref 8.9–10.3)
Creatinine, Ser: 1.17 mg/dL — ABNORMAL HIGH (ref 0.44–1.00)
GFR, EST AFRICAN AMERICAN: 48 mL/min — AB (ref >60)
GFR, EST NON AFRICAN AMERICAN: 42 mL/min — AB (ref >60)
Glucose, Bld: 120 mg/dL — ABNORMAL HIGH (ref 65–99)
POTASSIUM: 4.2 mmol/L (ref 3.5–5.1)
SODIUM: 140 mmol/L (ref 135–145)
Total Bilirubin: 2.1 mg/dL — ABNORMAL HIGH (ref 0.3–1.2)
Total Protein: 7.3 g/dL (ref 6.5–8.1)

## 2015-08-28 LAB — LIPASE, BLOOD: LIPASE: 46 U/L (ref 11–51)

## 2015-08-28 LAB — AMYLASE: AMYLASE: 99 U/L (ref 28–100)

## 2015-08-28 SURGERY — ESOPHAGOGASTRODUODENOSCOPY (EGD) WITH PROPOFOL
Anesthesia: General

## 2015-08-28 MED ORDER — FENTANYL CITRATE (PF) 100 MCG/2ML IJ SOLN
INTRAMUSCULAR | Status: AC | PRN
  Administered 2015-08-28 (×2): 25 ug via INTRAVENOUS

## 2015-08-28 MED ORDER — PROPOFOL 10 MG/ML IV BOLUS
INTRAVENOUS | Status: AC
  Filled 2015-08-28: qty 20

## 2015-08-28 MED ORDER — SODIUM CHLORIDE 0.9 % IV SOLN
3.0000 g | Freq: Once | INTRAVENOUS | Status: DC
  Filled 2015-08-28: qty 3

## 2015-08-28 MED ORDER — SUCCINYLCHOLINE CHLORIDE 20 MG/ML IJ SOLN
INTRAMUSCULAR | Status: DC | PRN
  Administered 2015-08-28: 100 mg via INTRAVENOUS

## 2015-08-28 MED ORDER — ONDANSETRON HCL 4 MG/2ML IJ SOLN
INTRAMUSCULAR | Status: AC
  Filled 2015-08-28: qty 2

## 2015-08-28 MED ORDER — SODIUM CHLORIDE 0.9 % IV SOLN: INTRAVENOUS | Status: DC

## 2015-08-28 MED ORDER — MIDAZOLAM HCL 2 MG/2ML IJ SOLN
INTRAMUSCULAR | Status: AC | PRN
  Administered 2015-08-28: 0.5 mg via INTRAVENOUS

## 2015-08-28 MED ORDER — GLUCAGON HCL RDNA (DIAGNOSTIC) 1 MG IJ SOLR
INTRAMUSCULAR | Status: AC
  Filled 2015-08-28: qty 1

## 2015-08-28 MED ORDER — ONDANSETRON HCL 4 MG/2ML IJ SOLN
INTRAMUSCULAR | Status: DC | PRN
  Administered 2015-08-28: 4 mg via INTRAVENOUS

## 2015-08-28 MED ORDER — LORAZEPAM 2 MG/ML IJ SOLN
0.5000 mg | Freq: Every evening | INTRAMUSCULAR | Status: DC | PRN
  Administered 2015-08-29 – 2015-09-05 (×8): 0.5 mg via INTRAVENOUS
  Filled 2015-08-28 (×8): qty 1

## 2015-08-28 MED ORDER — LIDOCAINE VISCOUS 2 % MT SOLN
OROMUCOSAL | Status: AC
  Filled 2015-08-28: qty 15

## 2015-08-28 MED ORDER — DEXAMETHASONE SODIUM PHOSPHATE 10 MG/ML IJ SOLN
INTRAMUSCULAR | Status: AC
  Filled 2015-08-28: qty 1

## 2015-08-28 MED ORDER — PROPOFOL 10 MG/ML IV BOLUS
INTRAVENOUS | Status: DC | PRN
  Administered 2015-08-28: 75 mg via INTRAVENOUS

## 2015-08-28 MED ORDER — LIDOCAINE HCL (CARDIAC) 20 MG/ML IV SOLN
INTRAVENOUS | Status: DC | PRN
  Administered 2015-08-28: 100 mg via INTRAVENOUS

## 2015-08-28 MED ORDER — ETOMIDATE 2 MG/ML IV SOLN
INTRAVENOUS | Status: DC | PRN
  Administered 2015-08-28: 15 mg via INTRAVENOUS

## 2015-08-28 MED ORDER — ETOMIDATE 2 MG/ML IV SOLN
INTRAVENOUS | Status: AC
  Filled 2015-08-28: qty 10

## 2015-08-28 MED ORDER — MIDAZOLAM HCL 2 MG/2ML IJ SOLN
INTRAMUSCULAR | Status: AC
  Filled 2015-08-28: qty 4

## 2015-08-28 MED ORDER — ENOXAPARIN SODIUM 40 MG/0.4ML ~~LOC~~ SOLN
40.0000 mg | SUBCUTANEOUS | Status: DC
  Administered 2015-08-28: 40 mg via SUBCUTANEOUS
  Filled 2015-08-28: qty 0.4

## 2015-08-28 MED ORDER — FENTANYL CITRATE (PF) 100 MCG/2ML IJ SOLN
INTRAMUSCULAR | Status: AC
  Filled 2015-08-28: qty 2

## 2015-08-28 MED ORDER — DEXAMETHASONE SODIUM PHOSPHATE 10 MG/ML IJ SOLN
INTRAMUSCULAR | Status: DC | PRN
  Administered 2015-08-28: 10 mg via INTRAVENOUS

## 2015-08-28 MED ORDER — INDOMETHACIN 50 MG RE SUPP
100.0000 mg | Freq: Once | RECTAL | Status: DC
Start: 2015-08-28 — End: 2015-09-06

## 2015-08-28 MED ORDER — INDOMETHACIN 50 MG RE SUPP
RECTAL | Status: AC
  Filled 2015-08-28: qty 2

## 2015-08-28 MED ORDER — LIDOCAINE HCL (CARDIAC) 20 MG/ML IV SOLN
INTRAVENOUS | Status: AC
  Filled 2015-08-28: qty 5

## 2015-08-28 MED ORDER — LACTATED RINGERS IV SOLN
INTRAVENOUS | Status: DC | PRN
  Administered 2015-08-28: 14:00:00 via INTRAVENOUS

## 2015-08-28 NOTE — Progress Notes (Signed)
[facility] PROGRESS NOTE  Kim Donovan [id number] DOB: [birth year] DOA: 08/19/2015 PCP: Mathews Argyle, MD  Summary 08/22/15: I have seen, examined Kim Donovan at bedside and reviewed her chart including off service note by Dr. Clementeen Graham which is incorporated verbatim in my note today. Appreciate oncology/general surgery. Kim Donovan is a pleasant 82 year old female with history of recently diagnosed metastatic renal cell carcinoma with liver metastases and underwent right renal biopsy (following with Dr. Learta Codding). He started her on a trial of systemic chemotherapy with pazopanib. Other medical history includes hypertension, polymyalgia rheumatica, glaucoma and hyperlipidemia. Patient presented to the ED on 1/1 with several episodes of nausea with vomiting and fever and chills at home 2 days duration. She had a fever 101F long with poor appetite. A CT scan of the abdomen and pelvis done in the ED showed a large right kidney mass and multiple metastatic disease in the liver. Also showed fluid collection extending from the antrum of the stomach to the hepatic flexure of colon measuring 75.7 cm. Patient was found to be septic in the ED with fever off 100.36F, tachycardia and tachypnea and subsequent WBC of 14.3. He was hyponatremic with sodium of 130 and mild transaminitis. Lactic acid was unremarkable. [name] surgery consulted and patient admitted to hospitalist service.... Patient also mentions cough and her chest x-ray suggested pneumonia "Vascular congestion noted. Mild bibasilar opacities may reflect mild  interstitial edema or possibly pneumonia". She is still nauseated and is afraid to eat. Her white count has improved. Noted plans to repeat CT abdomen and pelvis on 08/23/2015 or 08/24/2015. Will change fluids to D5WNS with KCl as oral intake poor, add Zithromax, and follow oncology/general surgery recommendations. 08/23/15: Appreciate general surgery/oncology. White count remains normal at  7000. Started feeding today. Blood cultures remain negative. Will obtain repeat CT abdomen and pelvis with IV and oral contrast to follow extent of fluid collection and check for bowel obstruction. Meanwhile, will discontinue vancomycin as blood cultures negative but continue Zosyn/Zithromax to complete 7 day course of antibiotics. Will follow general surgery/oncology recommendations. 08/24/15: Not yet strong enough to feel comfortable going home. On full liquids. Blood cultures remain negative. CT does not show fluid collection expansion but suggests pneumonia. White count continues to improve. Will deescalate antibiotics to Levaquin, advance diet as tolerated and plan d/c home in am if better. 08/25/15: Vomited last night but seems to have settled down. Would need rethinking whether abdominal fluid collection should be removed if vomiting persisted. Continue current management. 08/26/15: Vomited again this afternoon. Her lipase is slightly elevated, wonder if she has acute pancreatitis, will obtain influenza swab as patient had cold-like symptoms prior to admission, will keep patient nothing by mouth, give IV fluids, discontinue antibiotics to monitor for evidence of infection in which case would seriously consider taping abdominal fluid collection. Will consult GI in a.m. Discussed plan of care with the patient and relatives including Dr. [name], patient's niece. 08/27/15: Appreciate oncology. Continued to vomit last night, has increasing transaminitis, fever and tachycardia. Amylase is also elevated. Patient came off antibiotics yesterday suggesting that she may have localized source of infection including abscess collection being suppressed by antibiotics. Influenza swab negative fortunately. Discussed with Dr. Sherrill/Dr [name]. Will consult GI, keep patient nothing by mouth, give IV fluids and panculture including UA/urine culture/blood culture, will have low threshold to resume antibiotics if fever  persists. 08/28/15: Appreciate oncology/GI/interventional radiology. Patient had NG tube placed last night with relief of nausea and vomiting, she was also placed on  Zosyn and she is defervescing. Patient to have EGD/ERCP/drainage of abdominal fluid collection as laid out by Dr. Hilarie Fredrickson. Discussed with Dr. Benay Spice earlier today. She feels better today. We'll continue current management and follow GI/oncology/interventional radiology recommendations. Plan Sepsis (HCC)/CAP (community acquired pneumonia)/fever  Follow UA/urine culture/blood culture  Continue Zosyn. Renal mass, right/Liver masses/AKI (acute kidney injury) (HCC)/Nausea and vomiting/Hyponatremia/Intraabdominal fluid collection/Hepatic metastases (HCC)/elevated lipase/transaminitis  Nothing by mouth, gentle fluids and follow pancreatic/liver enzymes  For IR guided drainage of fluid &EGD/ERCP this afternoon  Follow general surgery/oncology/GI/IR recommendations Essential hypertension/GLAUCOMA/Polymyalgia rheumatica (HCC)/S/P AVR (aortic valve replacement)/Chronic diastolic CHF (congestive heart failure) ([place])  No acute changes  Continue current management Code Status: Full Code Family Communication: None at bedside Disposition Plan: Considering short-term rehabilitation   Consultants:  General Surgery  Oncology  GI  Interventional radiology  Procedures:  EGD/ERCP planned for 08/28/15  IR guided fluid collection drainage planned for 08/28/15  Antibiotics:  Vancomycin 08/20/15>08/23/15  Zosyn 08/20/15>08/24/15, 08/27/15>  Zithromax 08/22/15>08/24/15  Levaquin 08/24/15> 08/26/2015  HPI/Subjective: Feels better. No nausea.  Objective: Filed Vitals:   08/28/15 0441 08/28/15 1225  BP: 126/86 124/75  Pulse: 105 95  Temp: 98.9 F (37.2 C)   Resp: 20 22    Intake/Output Summary (Last 24 hours) at 08/28/15 1240 Last data filed at 08/28/15 1100  Gross per 24 hour  Intake 1627.5 ml  Output   1600 ml  Net   27.5 ml    Filed Weights   08/20/15 0431  Weight: 61.7 kg (136 lb 0.4 oz)    Exam:   General:  Comfortable at rest. NGT to LIS.  Cardiovascular: S1-S2 normal. No murmurs. Pulse regular.  Respiratory: Good air entry bilaterally. No rhonchi or rales.  Abdomen: Soft and nontender. Normal bowel sounds. No organomegaly.  Musculoskeletal: No pedal edema   Neurological: Intact  Data Reviewed: Basic Metabolic Panel:  Recent Labs Lab 08/24/15 0440 08/25/15 0430 08/26/15 0405 08/27/15 0540 08/28/15 0513  NA 137 137 138 138 140  K 4.2 4.0 4.0 4.0 4.2  CL 105 101 100* 98* 99*  CO2 24 26 29 31 29   GLUCOSE 112* 110* 101* 122* 120*  BUN 11 12 17 19 19   CREATININE 1.00 1.03* 1.09* 1.16* 1.17*  CALCIUM 8.1* 8.6* 8.8* 9.0 9.0   Liver Function Tests:  Recent Labs Lab 08/23/15 0408 08/24/15 0440 08/25/15 0430 08/27/15 0540 08/28/15 0513  AST 46* 37 56* 149* 241*  ALT 37 32 42 98* 199*  ALKPHOS 110 111 196* 352* 412*  BILITOT 0.7 0.6 1.2 1.7* 2.1*  PROT 6.5 6.3* 6.8 6.9 7.3  ALBUMIN 2.5* 2.4* 2.7* 2.7* 2.8*    Recent Labs Lab 08/26/15 0405 08/27/15 0540 08/28/15 0513  LIPASE 61* 49 46  AMYLASE 113* 112* 99   No results for input(s): AMMONIA in the last 168 hours. CBC:  Recent Labs Lab 08/24/15 0440 08/25/15 0430 08/26/15 0405 08/27/15 0540 08/28/15 0513  WBC 6.2 10.8* 7.7 10.1 11.3*  NEUTROABS 3.9  --   --  7.5 8.2*  HGB 11.8* 12.5 12.1 12.7 12.9  HCT 35.5* 38.1 36.6 38.7 39.9  MCV 89.6 88.8 89.9 90.2 91.3  PLT 302 336 331 342 353   Cardiac Enzymes: No results for input(s): CKTOTAL, CKMB, CKMBINDEX, TROPONINI in the last 168 hours. BNP (last 3 results)  Recent Labs  05/15/15 1115 06/15/15 0955  BNP 111.4* 100.1*    ProBNP (last 3 results) No results for input(s): PROBNP in the last 8760 hours.  CBG: No results  for input(s): GLUCAP in the last 168 hours.  Recent Results (from the past 240 hour(s))  Blood culture (routine x 2)     Status: None    Collection Time: 08/20/15 12:01 AM  Result Value Ref Range Status   Specimen Description BLOOD RIGHT ANTECUBITAL  Final   Special Requests BOTTLES DRAWN AEROBIC AND ANAEROBIC 6ML  Final   Culture   Final    NO GROWTH 5 DAYS Performed at [facility]    Report Status 08/25/2015 FINAL  Final  Blood culture (routine x 2)     Status: None   Collection Time: 08/20/15  3:05 AM  Result Value Ref Range Status   Specimen Description BLOOD BLOOD RIGHT FOREARM  Final   Special Requests BOTTLES DRAWN AEROBIC AND ANAEROBIC 6ML  Final   Culture   Final    NO GROWTH 5 DAYS Performed at [facility]    Report Status 08/25/2015 FINAL  Final     Studies: Ct Abdomen Wo Contrast  09-17-2015  CLINICAL DATA:  Abscess EXAM: CT ABDOMEN WITHOUT CONTRAST TECHNIQUE: Multidetector CT imaging of the abdomen was performed following the standard protocol without IV contrast. COMPARISON:  08/23/2015 FINDINGS: The stomach is severely distended with fluid. The ill-defined fluid collection in the right lower quadrant measure 6.1 x 5.3 cm. It has slightly enlarged since the prior study. Biliary dilatation persists. Tumor infiltration of the right kidney is unchanged. No left hydronephrosis. Small right pleural effusion is stable. Patchy airspace disease at the left lung base has slightly increased. No free-fluid in the abdomen.  No free intraperitoneal gas. IMPRESSION: The stomach is severely distended. This presents an aspiration risk with moderate sedation. NG tube decompression of the stomach is recommended prior to abscess drainage. The fluid collection in question within the right lower quadrant has slightly enlarged. Stable biliary dilatation. Increasing patchy airspace disease at the left lung base. Consider aspiration pneumonia. Electronically Signed   By: Marybelle Killings M.D.   On: 2015/09/17 16:17   Dg Abd Portable 1v  Sep 17, 2015  CLINICAL DATA:  82 year old female status post nasogastric tube placement.  EXAM: PORTABLE ABDOMEN - 1 VIEW COMPARISON:  Abdominal radiograph 08/22/2015. FINDINGS: Nasogastric tube in position with tip in the body of the stomach. Gas, oral contrast material and stool are seen scattered throughout the colon extending to the level of the distal rectum. No pathologic distension of small bowel is noted. No gross evidence of pneumoperitoneum. Surgical clips are noted in the anatomic pelvis. IMPRESSION: 1. Tip of nasogastric tube is in the body of the stomach. Electronically Signed   By: Vinnie Langton M.D.   On: 2015/09/17 17:33    Scheduled Meds: . antiseptic oral rinse  7 mL Mouth Rinse BID  . fentaNYL      . lactose free nutrition  237 mL Oral TID WC  . midazolam      . ondansetron (ZOFRAN) IV  4 mg Intravenous 4 times per day  . pantoprazole  40 mg Oral QAC breakfast  . piperacillin-tazobactam  3.375 g Intravenous Q8H  . sodium chloride  1,000 mL Intravenous Once  . sodium chloride  3 mL Intravenous Q12H   Continuous Infusions: . dextrose 5 % and 0.9 % NaCl with KCl 20 mEq/L 75 mL/hr at 09/17/15 2020     Time spent: 25 minutes    Fabyan Loughmiller  [facility] Pager [phone]. If 7PM-7AM, please contact night-coverage at [email or web address], password [facility] 08/28/2015, 12:40 PM  LOS: 8 days

## 2015-08-28 NOTE — Progress Notes (Signed)
Nutrition Follow-up  DOCUMENTATION CODES:   Severe malnutrition in context of chronic illness  INTERVENTION:  - RD will continue to monitor for POC and associated needs  NUTRITION DIAGNOSIS:   Malnutrition related to catabolic illness, cancer and cancer related treatments as evidenced by percent weight loss, severe depletion of muscle mass, severe depletion of body fat. -ongoing  GOAL:   Patient will meet greater than or equal to 90% of their needs -unmet  MONITOR:   Diet advancement, Weight trends, Labs, Skin, I & O's  ASSESSMENT:   Kim Donovan is a 82 y.o. female with a history of HTN. PMR, Hyperlipidemia, and Glaucoma, with recent biopsy of a Large Right Renal Mass and a Hepatic lesion on 08/10/2015 who presents to the ED with complaints of Nausea and Vomiting and Fever and Chills for the past 2 days. Her temperature was 101 at home. She has had decreased appetite and poor intake of foods and liquids. She has had some ABD pain. A CT scan of the ABD/Pelvis was performed and revealed a Large Right Kidney Mass And Multiple Metastatic Lesions in the Liver which were known, and a fluid collection extending from the antrum of the stomach to the Hepatic Flexure measuring 7.0 x 5.7 x 5.2 cm In the ED, a Sepsis workup was initiated and she was placed on IV Vancomycin and Zosyn  1/10 Per chart review, pt consumed 100% of lunch and dinner on Regular diet on 1/7 and no intakes recorded for 1/8. Pt has been NPO since 1/8 at 1609 and NGT to suction now in place with ~700cc drainage present at this time.   Per radiology note yesterday (1/9) at 1442: pt recently dx with renal cell carcinoma and she had a liver biopsy 08/10/15 with recent CT findings of "vague fluid collection tracking along the antrum of the stomach and hepatic flexure of colon suspicious for abscess."   Per GI note this AM at 1037, plan for intra-abdominal fluid collection today at noon. GI recommending ERCP for metal  stent placement. Plan to perform EGD before ERCP to rule out gastric outlet obstruction or tumor involvement.   Pt unable to meet needs while NPO. Will continue to monitor POC, ability to advance diet, and associated needs. Medications reviewed. Labs reviewed; Cl: 99 mmol/L, creatinine elevated and trending up, GFR: 42, LFTs elevated and trending up.    1/3 - Spoke with pt at bedside. - Pt reports poor appetite during recent illness, prior to which she stated she was eating "fairly well."  - Admits that recently she was "afraid to eat" because of vomiting. - Pt reports weight loss in the past couple weeks though she is unsure of how much.  - Per chart, pt has had a 16#/10.5% severe wt loss in 3 months. - Nutrition-Focused physical exam completed.  - Findings are severe fat depletion, severe muscle depletion, and moderate edema.  - She reports some nausea still, no longer vomiting, but wants to slowly progress to solid food. Current diet order is full liquids.  Diet Order:  Diet NPO time specified  Skin:  Reviewed, no issues  Last BM:  1/8  Height:   Ht Readings from Last 1 Encounters:  08/20/15 5\' 6"  (1.676 m)    Weight:   Wt Readings from Last 1 Encounters:  08/20/15 136 lb 0.4 oz (61.7 kg)    Ideal Body Weight:  59.09 kg  BMI:  Body mass index is 21.97 kg/(m^2).  Estimated Nutritional Needs:   Kcal:  1800-2100 calories  Protein:  60-75 grams  Fluid:  >/= 1.8L  EDUCATION NEEDS:   Education needs addressed     Jarome Matin, RD, LDN Inpatient Clinical Dietitian Pager # [phone] After hours/weekend pager # [phone]

## 2015-08-28 NOTE — Anesthesia Procedure Notes (Signed)
Procedure Name: Intubation Date/Time: 08/28/2015 1:50 PM Performed by: Lind Covert Pre-anesthesia Checklist: Patient identified, Timeout performed, Emergency Drugs available, Suction available and Patient being monitored Patient Re-evaluated:Patient Re-evaluated prior to inductionOxygen Delivery Method: Circle system utilized Preoxygenation: Pre-oxygenation with 100% oxygen Intubation Type: IV induction Laryngoscope Size: Mac and 3 Grade View: Grade II Tube type: Parker flex tip Tube size: 7.0 mm Number of attempts: 2 Airway Equipment and Method: Stylet Placement Confirmation: ETT inserted through vocal cords under direct vision,  breath sounds checked- equal and bilateral and positive ETCO2 Secured at: 21 cm Tube secured with: Tape Dental Injury: Teeth and Oropharynx as per pre-operative assessment

## 2015-08-28 NOTE — Progress Notes (Signed)
Pt seen this morning.  Sister at bedside. Patient had NG tube placed yesterday with drainage of 1 L of nonbloody fluid Denies abdominal pain today Increasing liver enzymes Plan for IR drainage of intra-abdominal fluid collection at noon today I discussed the case with Dr. Fuller Plan regarding biliary obstruction secondary to malignancy. We have recommended ERCP for metal stent placement to provide biliary decompression. We also will perform EGD prior to ERCP to evaluate upper GI tract and rule out gastric outlet obstruction or tumor involvement I've explained ERCP including the risks, benefits and alternatives to the patient and her family. We discussed the risk for bleeding, infection, pancreatitis, and sedation related complications. We also discussed some complications can be severe and even result in death. After this discussion the patient is agreeable to proceed I've spoken to Dr. Vernard Gambles who will plan drain placement in right lower quadrant fluid collection at noon today followed by ERCP with general anesthesia at 2:30 performed by Dr. Fuller Plan in endoscopy

## 2015-08-28 NOTE — Anesthesia Postprocedure Evaluation (Signed)
Anesthesia Post Note  Patient: RAYSHAWN HAYS  Procedure(s) Performed: Procedure(s) (LRB): ESOPHAGOGASTRODUODENOSCOPY (EGD) WITH PROPOFOL (N/A)  Patient location during evaluation: PACU Anesthesia Type: General Level of consciousness: awake and alert Pain management: pain level controlled Vital Signs Assessment: post-procedure vital signs reviewed and stable Respiratory status: spontaneous breathing, nonlabored ventilation, respiratory function stable and patient connected to nasal cannula oxygen Cardiovascular status: blood pressure returned to baseline and stable Postop Assessment: no signs of nausea or vomiting Anesthetic complications: no    Last Vitals:  Filed Vitals:   08/28/15 1434 08/28/15 1440  BP:  143/75  Pulse: 96   Temp: 36.7 C   Resp: 23 25    Last Pain:  Filed Vitals:   08/28/15 1449  PainSc: 0-No pain                 Catalina Gravel

## 2015-08-28 NOTE — H&P (View-Only) (Signed)
Referring Provider: [facility] Primary Care Physician:  Mathews Argyle, MD Primary Gastroenterologist:  unassigned  Reason for Consultation:  Nausea, abnormal LFTs    HPI: Kim Donovan is a 82 y.o. female is a pleasant 82 year old female who recently was diagnosed with metastatic renal cell carcinoma with liver metastasis.patient reports she had 2-3 months of right-sided abdominal flank pain and had testing that showed an abnormal right kidney with retroperitoneal adenopathy. She had a renal biopsywith IR on December 23. and is following with Dr. Benay Spice  of oncology. She was started on chemotherapy with  pazopanib. She also has a past medical history polymyalgia rheumatica, glaucoma, hypertension, status post aVR, chronic diastolic CHF, and hyperlipidemia. Saree presented to the emergency room in January 1 with complaints of fever, chills, nausea, vomiting, and anorexia 2 days duration. A CT of the abdomen and pelvis performed in the emergency room showed a large right kidney mass and multiple metastatic disease in the liver as well as a fluid collection extending from the antrum and stomach to the hepatic flexure of the colon measuring 7 x 5.7 cm. She was febrile in the emergency room, tachycardic, and tachypnea can have an elevated white blood count of 14.3. Sodium noted to have a mild transaminitis and hyponatremia. She was evaluated by [facility] and felt to not be a surgical candidate. She had a repeat CT on January 5 and was noted to have an enlarging right pleural effusion and worsening left lower lobe infiltrate. Stable hepatic metastatic lesions. Stable intrahepatic biliary dilatation with an abrupt cutoff of the common bile duct most likely due to a tight stricture or cholangiocarcinoma. Diffusely infiltrated right kidney. Stable complex fluid collection in the small bowel mesentery. Slight increase in pelvic fluid. Patient has also been noted over the past several days to have an  elevation of her LFTs. 3 days ago her total bili was 0.6, alkaline phosphatase 111, ALT 32, AST 37. This morning her total bili is 1.7, alkaline phosphatase 352, ALT 98, AST 149. She has continued to experience significant nausea and vomiting. She had a fever of 100.3 earlier this morning, and 100.9 last evening.she has also been evaluated by interventional radiology today for consideration of aspiration and possible drainage of the mid abdominal fluid collection.   Past Medical History  Diagnosis Date  . Hyperlipidemia   . Osteoarthritis   . Osteoporosis/osteopenia increased risk   . Glaucoma   . Aortic stenosis     myoview neg ischemia 11/2006  ---- echo 11/10: mean AVA gradient~47   ---- s/p bioprosthetic AVR 12/2009  . Polymyalgia rheumatica ([place]) 11/05/2010  . PVD (peripheral vascular disease) ([facility])     left leg swells  . Hypertension     pt. states does not have HTN, no meds    Past Surgical History  Procedure Laterality Date  . Appendectomy    . Tonsillectomy    . Cholecystectomy  1958  . Cardiac valve replacement  May 2011    aortic valve  Por[facility]  . Carpal tunnel release  12/2010    left hand  . Cataract/glaucoma surgery  11/2010    [facility]  . Breast surgery  1985    [facility]  . Abdominal hysterectomy      for fibroids  . Mastectomy      was only for removal of cysts, benign  . Eye surgery  01/2011    for glaucoma  . Carpal tunnel release  08/29/2011    Procedure: CARPAL TUNNEL  RELEASE;  Surgeon: Cammie Sickle., MD;  Location: [facility];  Service: Orthopedics;  Laterality: Right;    Prior to Admission medications   Medication Sig Start Date End Date Taking? Authorizing Provider  ALPRAZolam (XANAX) 0.25 MG tablet Take 1 tablet (0.25 mg total) by mouth at bedtime as needed for anxiety. 08/06/15  Yes Ladell Pier, MD  aspirin 81 MG tablet Take 81 mg by mouth daily.     Yes Historical Provider, MD  Calcium Citrate-Vitamin D 250-200 MG-UNIT TABS  Take 2 tablets by mouth daily.    Yes Historical Provider, MD  Diphenhydramine-APAP, sleep, (TYLENOL PM EXTRA STRENGTH PO) Take 1 tablet by mouth at bedtime as needed (pain/sleep).    Yes Historical Provider, MD  dorzolamide-timolol (COSOPT) 22.3-6.8 MG/ML ophthalmic solution Place 1 drop into the left eye 2 (two) times daily.    Yes Historical Provider, MD  furosemide (LASIX) 20 MG tablet Take 1 tablet (20 mg total) by mouth daily. 05/15/15  Yes Larey Dresser, MD  Multiple Vitamins-Minerals (CENTRUM SILVER ULTRA WOMENS PO) Take 1 tablet by mouth daily.     Yes Historical Provider, MD  Omega-3 Fatty Acids (FISH OIL) 1000 MG CAPS Take 1 capsule by mouth daily. Reported on 08/06/2015   Yes Historical Provider, MD  pazopanib (VOTRIENT) 200 MG tablet Take 4 tablets (800 mg total) by mouth daily. Take on an empty stomach. 08/14/15  Yes Ladell Pier, MD  PREDNISONE, PAK, PO Take 2.5 mg by mouth every other day.    Yes Historical Provider, MD  ezetimibe (ZETIA) 10 MG tablet Take 1 tablet (10 mg total) by mouth daily. 05/12/14   Jolaine Artist, MD  potassium chloride (K-DUR) 10 MEQ tablet Take 1 tablet (10 mEq total) by mouth daily. Patient not taking: Reported on 08/14/2015 05/15/15   Larey Dresser, MD    Current Facility-Administered Medications  Medication Dose Route Frequency Provider Last Rate Last Dose  . acetaminophen (TYLENOL) tablet 650 mg  650 mg Oral Q6H PRN Theressa Millard, MD       Or  . acetaminophen (TYLENOL) suppository 650 mg  650 mg Rectal Q6H PRN Theressa Millard, MD   650 mg at 08/27/15 0014  . ALPRAZolam Duanne Moron) tablet 0.25 mg  0.25 mg Oral QHS PRN Simbiso Ranga, MD   0.25 mg at 08/25/15 2233  . antiseptic oral rinse (CPC / CETYLPYRIDINIUM CHLORIDE 0.05%) solution 7 mL  7 mL Mouth Rinse BID Theressa Millard, MD   7 mL at 08/27/15 1100  . dextrose 5 % and 0.9 % NaCl with KCl 20 mEq/L infusion   Intravenous Continuous Simbiso Ranga, MD 50 mL/hr at 08/27/15 1410 50 mL at  08/27/15 1410  . guaiFENesin-dextromethorphan (ROBITUSSIN DM) 100-10 MG/5ML syrup 5 mL  5 mL Oral Q4H PRN Ritta Slot, NP   5 mL at 08/25/15 0131  . hydrALAZINE (APRESOLINE) injection 2 mg  2 mg Intravenous Q6H PRN Theressa Millard, MD      . HYDROmorphone (DILAUDID) injection 0.5-1 mg  0.5-1 mg Intravenous Q3H PRN Theressa Millard, MD      . iohexol (OMNIPAQUE) 300 MG/ML solution 50 mL  50 mL Oral Once PRN Simbiso Ranga, MD      . lactose free nutrition (BOOST PLUS) liquid 237 mL  237 mL Oral TID WC Satira Anis Ward, RD   Stopped at 08/27/15 0800  . ondansetron (ZOFRAN) injection 4 mg  4 mg Intravenous 4 times per  day April Palumbo, MD   4 mg at 08/27/15 1300  . ondansetron (ZOFRAN) tablet 4 mg  4 mg Oral Q6H PRN Theressa Millard, MD       Or  . ondansetron (ZOFRAN) injection 4 mg  4 mg Intravenous Q6H PRN Theressa Millard, MD      . pantoprazole (PROTONIX) EC tablet 40 mg  40 mg Oral QAC breakfast Thomes Lolling, RPH   40 mg at 08/27/15 [id number]  . promethazine (PHENERGAN) injection 12.5 mg  12.5 mg Intravenous Q6H PRN April Palumbo, MD   12.5 mg at 08/27/15 0807  . sodium chloride 0.9 % bolus 1,000 mL  1,000 mL Intravenous Once April Palumbo, MD   1,000 mL at 08/20/15 0609  . sodium chloride 0.9 % injection 3 mL  3 mL Intravenous Q12H Theressa Millard, MD   3 mL at 08/27/15 1000    Allergies as of 08/19/2015 - Review Complete 08/19/2015  Allergen Reaction Noted  . Tramadol Rash     Family History  Problem Relation Age of Onset  . Deep vein thrombosis Mother   . Heart attack Father   . Stroke Father   . Osteoporosis      aunt    Social History   Social History  . Marital Status: Married    Spouse Name: N/A  . Number of Children: N/A  . Years of Education: N/A   Occupational History  . Not on file.   Social History Main Topics  . Smoking status: Former Smoker -- 0.50 packs/day    Types: Cigarettes    Quit date: 08/18/1978  . Smokeless tobacco: Not on file  .  Alcohol Use: No  . Drug Use: Not on file  . Sexual Activity: Not on file   Other Topics Concern  . Not on file   Social History Narrative   Married   Walks   HH of 2   No pets   Social ocass etoh             Review of Systems: Per history of present illness, otherwise negative.  Physical Exam: Vital signs in last 24 hours: Temp:  [97.4 F (36.3 C)-100.9 F (38.3 C)] 97.7 F (36.5 C) (01/09 1357) Pulse Rate:  [100-117] 107 (01/09 1357) Resp:  [17-20] 20 (01/09 1357) BP: (129-151)/(86-101) 138/89 mmHg (01/09 1357) SpO2:  [95 %-98 %] 98 % (01/09 1357) Last BM Date: 08/25/15 General:   Alert,  Well-developed, well-nourished, pleasant and cooperative in NAD Head:  Normocephalic and atraumatic. Eyes:  Sclera clear, no icterus. Conjunctiva pink. Ears:  Normal auditory acuity. Nose:  No deformity, discharge,  or lesions. Mouth:  No deformity or lesions.   Neck:  Supple; no masses or thyromegaly. Lungs: diminished breath sounds at bases with scattered rhonchi throughout.  Heart:  Regular rate and rhythm; ttachycardic Abdomen:  Soft,mild right upper quadrant tenderness to palpation with no rebound or guarding, BS active,nonpalp mass or hsm.   Rectal:  Deferred  Msk:  Symmetrical without gross deformities. . Pulses:  Normal pulses noted. Extremities:  Without clubbing or edema. Neurologic:  Alert and  oriented x4;  grossly normal neurologically. Skin: Intact without significant lesions or rashes.. Psych:  Alert and cooperative. Normal mood and affect.  Intake/Output from previous day: 01/08 0701 - 01/09 0700 In: 1315.8 [P.O.:500; I.V.:815.8] Out: 1 [Urine:1] Intake/Output this shift: Total I/O In: -  Out: 200 [Other:200]  Lab Results:  Recent Labs  08/25/15 0430 08/26/15 0405 08/27/15 0540  WBC 10.8* 7.7 10.1  HGB 12.5 12.1 12.7  HCT 38.1 36.6 38.7  PLT 336 331 342   BMET  Recent Labs  08/25/15 0430 08/26/15 0405 08/27/15 0540  NA 137 138 138  K 4.0  4.0 4.0  CL 101 100* 98*  CO2 26 29 31   GLUCOSE 110* 101* 122*  BUN 12 17 19   CREATININE 1.03* 1.09* 1.16*  CALCIUM 8.6* 8.8* 9.0   LFT  Recent Labs  08/27/15 0540  PROT 6.9  ALBUMIN 2.7*  AST 149*  ALT 98*  ALKPHOS 352*  BILITOT 1.7*   Studies/Results:     Show images for CT Abdomen Pelvis W Contrast     Addendum     Kalman Jewels, MD  Ludwig Clarks Aug 24, 2015 11:29:18 AM EST     ADDENDUM REPORT: 08/24/2015 11:26 ADDENDUM: I reviewed the patient's prior CT scan from 2008. This study demonstrated mild intrahepatic biliary dilatation and moderate extra hepatic biliary dilatation with the common bile duct measuring approximately 11 mm. The findings on the recent CT scans are most likely due to progressive intrahepatic biliary dilatation due to the patient's age and prior cholecystectomy. There may be some extrinsic compression of the intrapancreatic portion of the common bile duct due to the right renal mass and perinephric fluid. No obvious intraductal mass, pancreatic mass or adenopathy. Patient's liver function studies are normal. Attention on future scans is suggested. Electronically Signed  By: Marijo Sanes M.D.  On: 08/24/2015 11:26     Study Result     CLINICAL DATA: Followup diverticulosis. Improving abdominal pain.  EXAM: CT ABDOMEN AND PELVIS WITH CONTRAST  TECHNIQUE: Multidetector CT imaging of the abdomen and pelvis was performed using the standard protocol following bolus administration of intravenous contrast.  CONTRAST: 118mL OMNIPAQUE IOHEXOL 300 MG/ML SOLN  COMPARISON: Prior CT scans 08/20/2015 and 08/08/2015. Prior MRI examination 08/03/2015  FINDINGS: Lower chest: A small to moderate-sized right pleural effusion is noted. This has enlarged since the prior CT scan. There is overlying atelectasis. Patchy left lower lobe infiltrate and small left effusion. The heart is mildly enlarged. Prosthetic aortic valve is noted.  Coronary artery calcifications are again demonstrated.  Hepatobiliary: Stable significant intrahepatic biliary dilatation without abrupt cut off afford enters the pancreatic head. Findings could be due to a tight stricture or biliary tumor. The common bile duct in the pancreatic head is normal in caliber in the main pancreatic duct is normal in caliber.  Again demonstrated are stable hepatic metastatic lesions. The portal and hepatic veins are patent.  Pancreas: No mass or inflammation.  Spleen: Normal size. No focal lesions.  Adrenals/Urinary Tract: Stable diffusely infiltrated right kidney, most likely by tumor. Chronic infection is also possible. Tumor/clot noted in a dilated right renal vein.  Stomach/Bowel: The stomach, duodenum, small bowel and colon are grossly normal.  Vascular/Lymphatic: Tortuosity, ectasia and calcification of the thoracic aorta. The branch vessels are patent. Stable mesenteric and retroperitoneal lymph nodes.  Other: Stable complex fluid collection in the mesentery surrounded by small bowel loops and vessels. There is also moderate free pelvic fluid. The bladder appears normal. No pelvic mass or adenopathy. No inguinal mass or adenopathy.  Musculoskeletal: No significant bony findings. Scoliosis and degenerative changes noted in the spine.  IMPRESSION: 1. Overall stable CT findings in the abdomen/pelvis. 2. Enlarging right pleural effusion and worsening left lower lobe infiltrate. 3. Stable hepatic metastatic lesions. 4. Stable intrahepatic biliary dilatation with an abrupt cut off of the common bile duct most likely  due to a tight stricture or cholangiocarcinoma. 5. Diffusely infiltrated right kidney. This could be an infiltrating renal cell carcinoma or transitional cell carcinoma. 6. Stable complex fluid collection in the small bowel mesenteric. Slight increase in pelvic fluid.  Electronically Signed: By: Marijo Sanes  M.D. On: 08/23/2015 17:41   Pelvis W Contrast   Status: Final result       PACS Images     Show images for CT Abdomen Pelvis W Contrast     Study Result     CLINICAL DATA: Acute onset of nausea, vomiting and fever. Recent liver biopsy on August 10, 2015. Initial encounter.  EXAM: CT ABDOMEN AND PELVIS WITH CONTRAST  TECHNIQUE: Multidetector CT imaging of the abdomen and pelvis was performed using the standard protocol following bolus administration of intravenous contrast.  CONTRAST: 57mL OMNIPAQUE IOHEXOL 300 MG/ML SOLN  COMPARISON: CT of the chest, abdomen and pelvis performed 08/08/2015, and MRI of the abdomen performed 08/03/2015  FINDINGS: A trace right-sided pleural fluid is noted, new from the prior study, possibly reactive in nature. Scattered coronary artery calcifications are noted.  The known multiple metastatic lesions within the liver are better characterized on prior CT; associated perfusion anomalies are not well assessed given the phase of contrast enhancement. These appear to be relatively stable in size.  A large mass is again noted occupying much of the right kidney, with mild sparing of the lower pole of the right kidney. The degree of infiltration of the retroperitoneum anterior to the lumbar spine appears mildly worsened, and the mass thus measures approximately 10.5 x 6.4 x 9.2 cm. Per correlation with recent biopsy results, this most likely reflects urothelial carcinoma.  Surrounding soft tissue inflammation and trace fluid is seen. There is new vague fluid attenuation tracking adjacent to the antrum of the stomach and hepatic flexure of the colon, measuring approximately 7.0 x 5.7 x 5.2 cm, which still contains several foci of fat. Given the patient's leukocytosis, mild apparent peripheral enhancement, and the relatively low attenuation of this collection, this is concerning for evolving abscess infiltrating the  colonic mesentery. It is likely not sufficiently organized to be fully drainable at this time.  There is adjacent wall thickening involving the hepatic flexure of the colon, and surrounding soft tissue inflammation is noted about the omentum.  The patient is status post cholecystectomy. Prominence of the common bile duct and intrahepatic biliary ducts is relatively stable from the prior study. The spleen is unremarkable in appearance. The pancreas and adrenal glands are grossly unremarkable, though mass infiltrates about portions of the right adrenal gland.  The left kidney is unremarkable in appearance. On the left side, there is no evidence of hydronephrosis. No renal or ureteral stones are seen.  The small bowel is grossly unremarkable. The duodenum is decompressed and passes near the collection of fluid. The stomach is largely distended with contrast and air, and is grossly unremarkable. No acute vascular abnormalities are seen. There is scattered calcification along the abdominal aorta and its branches.  The patient is status post appendectomy. Scattered diverticulosis is noted along the sigmoid colon, without evidence of diverticulitis. There is unusual prominence of varices within the retroperitoneum and pelvis.  The bladder is mildly distended and grossly unremarkable. The patient is status post hysterectomy. No suspicious adnexal masses are seen. No inguinal lymphadenopathy is seen.  No acute osseous abnormalities are identified. Multilevel vacuum phenomenon is noted along the lumbar spine, with mild grade 1 anterolisthesis of L4 on L5, reflecting underlying facet  disease.  IMPRESSION: 1. New vague fluid collection tracking adjacent to the antrum of the stomach and hepatic flexure of the colon, measuring 7.0 x 5.7 x 5.2 cm, which still contains several foci of fat. Given the patient's leukocytosis, mild apparent peripheral enhancement, and the relatively low  attenuation of this collection, this is concerning for evolving abscess infiltrating the colonic mesentery. It is likely not sufficiently organized to be fully drainable at this time. 2. Adjacent associated wall thickening involving the hepatic flexure of the colon, and surrounding soft tissue inflammation about the omentum. 3. Trace right-sided pleural fluid, new from the prior study, possibly reactive in nature. 4. Large mass again noted occupying much of the right kidney, with mild sparing of the lower pole of the right kidney. There appears to be mildly worsened infiltration of the retroperitoneum anterior to the lumbar spine, and partial infiltration about the right adrenal gland; the mass thus measures approximately 10.5 x 6.4 x 9.2 cm. Per correlation with recent biopsy results, this most likely reflects urothelial carcinoma. 5. Known multiple metastatic lesions within the liver are better characterized on prior CT, with associated perfusion anomalies, not well assessed on this study due to the phase of contrast enhancement. Metastatic lesions appear relatively stable from the recent prior CT. 6. Scattered coronary artery calcifications seen. 7. Scattered calcification along the abdominal aorta and its branches. 8. Scattered diverticulosis along the sigmoid colon, without evidence of diverticulitis. 9. Unusual prominence of varices within the retroperitoneum and pelvis. 10. Mild degenerative change noted along the lumbar spine.  These results were called by telephone at the time of interpretation on 08/20/2015 at 2:32 am to Dr. Veatrice Kells, who verbally acknowledged these results.   Electronically Signed  By: Garald Balding M.D.  On: 08/20/2015 02:34   Images     Show images for MR Abdomen W Wo Contrast     Study Result     CLINICAL DATA: Ultrasound demonstrating lack of right-sided renal sinus fat. Possible mass. Right upper quadrant  pain.  EXAM: MRI ABDOMEN WITHOUT AND WITH CONTRAST  TECHNIQUE: Multiplanar multisequence MR imaging of the abdomen was performed both before and after the administration of intravenous contrast.  CONTRAST: 15mL MULTIHANCE GADOBENATE DIMEGLUMINE 529 MG/ML IV SOLN  COMPARISON: Ultrasound of 07/30/2015. CT of 01/07/2007.  FINDINGS: Mild motion degradation throughout.  Lower chest: Cardiomegaly. Median sternotomy. Small right pleural effusion.  Hepatobiliary: Multiple right-sided liver lesions. Subcapsular posterior right hepatic lobe 1.3 cm is T2 hyperintense and hypo enhancing, including on image 28/series 4. A subcapsular anterior segment right liver lobe 1.5 cm lesion is identified on image 44/series 903.  Cholecystectomy. The intrahepatic ducts are mildly prominent. The common duct measures up to 1.5 cm in the porta hepatis on image 12/ series 7. Tapers to 8 mm in thepancreatic head. Suspect mass-effect secondary to the right-sided retroperitoneal process.  Pancreas: Normal, without mass or ductal dilatation.  Spleen: Normal in size, without focal abnormality.  Adrenals/Urinary Tract: Normal left adrenal gland. Right adrenal not well evaluated. A too small to characterize left renal lesion.  Markedly abnormal appearance of the right kidney. Mildly enlarged, with dilated renal collecting systems and renal pelvis. Possible renal pelvic stone on image 24/series 7. Areas of precontrast T1 hyperintensity within are suspicious for hemorrhage. Example series 900. Markedly diminished post-contrast enhancement throughout the right kidney. Surrounding perinephric edema and cyst early post-contrast hyperemia in the adjacent right hepatic lobe. Example image 36/ series 901.  Stomach/Bowel: Mild gastric distension. Normal caliber of abdominal bowel  loops.  Vascular/Lymphatic: Normal caliber of the aorta and branch vessels. Suspect extension of the right renal  process (likely tumor) into the IVC, including on image 14 of series 4. The more inferior IVC is also hypo enhancing on image 44/ series 904. Limited evaluation for retroperitoneal adenopathy. Soft tissue fullness in the aortocaval space is suspicious on image 41/ series 904.  Other: No abdominal ascites.  Musculoskeletal: S-shaped thoracolumbar spine curvature.  IMPRESSION: 1. Markedly abnormal appearance of the right kidney. Enlarged with marked hypo enhancement. Extension of this process medially into the IVC with probable adjacent retroperitoneal adenopathy. Findings are suspicious for infiltrative renal neoplasm with venous extension. Given concurrent caliectasis and possible obstructing renal pelvic stone, an infectious process such as xanthogranulomatous pyelonephritis is possible but felt less likely. Given motion on current exam, consider further evaluation with hematuria protocol abdominal pelvic pre and post contrast CT. These results will be called to the ordering clinician or representative by the Radiologist Assistant, and communication documented in the PACS or zVision Dashboard. 2. Right-sided liver lesions which are suspicious for metastatic disease, presuming the renal process is neoplasm. Extension of infection felt less likely. 3. Small right pleural effusion. 4. Cholecystectomy with biliary duct dilatation. Question mass effect by the right-sided retroperitoneal process. Recommend attention on follow-up.   Electronically Signed  By: Abigail Miyamoto M.D.  On: 08/03/2015 13:43     IMPRESSION/PLAN: 82 year old female with new diagnosis metastatic renal cell carcinoma status post liver lesion biopsy on December 23, admitted January 1 with persistent nausea and vomiting. Patient has been evaluated by interventional radiology due to fluid collection along the antrum of the stomach and hepatic flexure colon and is currently being consideration of aspiration.  CT also shows dilation of the CBD with abrupt cutoff and patient has had rising alkaline phosphatase, bili, transaminases in the past 3 days. She continues to have right upper quadrant pain with nausea and vomiting. Patient with her plan is to proceed with ERCP with possible stenting.    Shamus Desantis, Deloris Ping 08/27/2015,  Pager [phone]  Mon-Fri 8a-5p [phone] after 5p, weekends, holidays

## 2015-08-28 NOTE — Op Note (Signed)
[facility] [place], [address]   ENDOSCOPY PROCEDURE REPORT  PATIENT: Kim Donovan, Kim Donovan  MR#: [id number] BIRTHDATE: October 25, 1928 , 86  yrs. old GENDER: female ENDOSCOPIST: Ladene Artist, MD, [facility] REFERRED BY:  [facility], [facility] PROCEDURE DATE:  08/28/2015 PROCEDURE:  EGD, diagnostic ASA CLASS:     Class III INDICATIONS:  biliary dilation on Computed Tomogram Scan, abnormal liver function test , abnormal CT of the GI tract, and nausea, vomiting. MEDICATIONS: Per Anesthesia TOPICAL ANESTHETIC: none DESCRIPTION OF PROCEDURE: After the risks benefits and alternatives of the procedure were thoroughly explained, informed consent was obtained.  The    endoscope was introduced through the mouth and advanced to the second portion of the duodenum , Limited by a large volume of food debris in stomach.  The instrument was slowly withdrawn as the mucosa was fully examined.  DUODENUM: There was a 5cm long, severe, extrinsic appearing stricture, with an inner diameter of 33m, in the 2nd part of the duodenum.  The stricture was traversable with resistance. The major ampulla could not be located and its typical location would have been in the middle of the stricture. I felt it would be risky to attempt to traverse the stricture with the larger diameter and side viewing ERCP scope and, in addition, the ampulla was not located so ERCP was not attmepted.  The duodenal mucosa showed no abnormalities in the 3rd part of the duodenum and duodenal bulb. STOMACH: The visualized mucosa of the stomach appeared normal.  The distal antrum and pylorus were well seen.  There was residual food seen in the gastric fundus and gastric body which obscured most of the visualization. ESOPHAGUS: There was a benign appearing stricture at the gastroesophageal junction.  The stricture was easily traversable. Otherwise the mucosa of the esophagus appeared normal. Retroflexion was not  performed due to the large volume of retained food.    The scope was then withdrawn from the patient and the procedure completed.  COMPLICATIONS: There were no immediate complications.  ENDOSCOPIC IMPRESSION: 1.   Severe stricture in the 2nd part of the duodenum, ERCP not attempted 2.   Large volume of food residue in the gastric fundus and gastric body 3.   Stricture at the gastroesophageal junction  RECOMMENDATIONS: 1.  Continue NG suction 2.  Consider duodenal stent and PTC-will discuss with Dr. PHilarie Fredrickson IR, patient and family [R eSigned:  MLadene Artist MD, [facility]01/05/2016 2:45 PM

## 2015-08-28 NOTE — Transfer of Care (Signed)
Immediate Anesthesia Transfer of Care Note  Patient: Kim Donovan  Procedure(s) Performed: Procedure(s): ESOPHAGOGASTRODUODENOSCOPY (EGD) WITH PROPOFOL (N/A)  Patient Location: PACU  Anesthesia Type:General  Level of Consciousness: sedated  Airway & Oxygen Therapy: Patient Spontanous Breathing and Patient connected to face mask oxygen  Post-op Assessment: Report given to RN and Post -op Vital signs reviewed and stable  Post vital signs: Reviewed and stable  Last Vitals:  Filed Vitals:   08/28/15 1257 08/28/15 1337  BP: 147/82 158/94  Pulse: 98 97  Temp:  36.4 C  Resp: 22 20    Complications: No apparent anesthesia complications

## 2015-08-28 NOTE — Procedures (Signed)
CT guided RUQ 4f drain catheter placement 70ml bilious aspirate sent for GS, C&S No complication No blood loss. See complete dictation in [facility].

## 2015-08-28 NOTE — Interval H&P Note (Signed)
History and Physical Interval Note:  08/28/2015 1:31 PM  Kim Donovan  has presented today for surgery, with the diagnosis of biliary obstruction, elevated LFTS, metastatic renal CA  The various methods of treatment have been discussed with the patient and family. After consideration of risks, benefits and other options for treatment, the patient has consented to  Procedure(s) with comments: ENDOSCOPIC RETROGRADE CHOLANGIOPANCREATOGRAPHY (ERCP) (N/A) - with stenting as a surgical intervention .  The patient's history has been reviewed, patient examined, no change in status, stable for surgery.  I have reviewed the patient's chart and labs.  Questions were answered to the patient's satisfaction.     Pricilla Riffle. Fuller Plan

## 2015-08-28 NOTE — Care Management Important Message (Signed)
Important Message  Patient Details  Name: Kim Donovan MRN: [id number] Date of Birth: 03/15/1929   Medicare Important Message Given:  Yes    [name] 08/28/2015, 10:50 AMImportant Message  Patient Details  Name: Kim Donovan MRN: [id number] Date of Birth: 02/08/1929   Medicare Important Message Given:  Yes    [name] 08/28/2015, 10:50 AM

## 2015-08-28 NOTE — Anesthesia Preprocedure Evaluation (Addendum)
Anesthesia Evaluation  Patient identified by MRN, date of birth, ID band Patient awake    Reviewed: Allergy & Precautions, NPO status , Patient's Chart, lab work & pertinent test results  Airway Mallampati: II  TM Distance: >3 FB Neck ROM: Full    Dental  (+) Teeth Intact, Dental Advisory Given, Caps   Pulmonary pneumonia, former smoker,     + decreased breath sounds      Cardiovascular hypertension, + Peripheral Vascular Disease and +CHF  negative cardio ROS Normal cardiovascular exam+ Valvular Problems/Murmurs (s/p bioprosthetic AVR 2011) AS  Rhythm:Regular Rate:Normal  TTE 05/15/15: Study Conclusions  - Left ventricle: Global LV longitudinal strain is -18% The cavitysize was normal. Systolic function was normal. The estimatedejection fraction was in the range of 55% to 60%. Wall motion wasnormal; there were no regional wall motion abnormalities. Features are consistent with a pseudonormal left ventricularfilling pattern, with concomitant abnormal relaxation andincreased filling pressure (grade 2 diastolic dysfunction). - Aortic valve: Poorly visualized. Severely calcified annulus.Probably trileaflet. Moderate diffuse thickening andcalcification. - Mitral valve: Calcified annulus. There was trivial regurgitation. - Pulmonary arteries: PA peak pressure: 32 mm Hg (S).     Neuro/Psych PSYCHIATRIC DISORDERS Depression negative neurological ROS     GI/Hepatic Neg liver ROS, GERD  Medicated,Nausea/vomiting   Endo/Other  negative endocrine ROS  Renal/GU Renal diseaseRenal cell carcinoma  With liver mets     Musculoskeletal  (+) Arthritis  (polymyalgia rheumatica),   Abdominal   Peds  Hematology negative hematology ROS (+)   Anesthesia Other Findings Day of surgery medications reviewed with the patient.  Reproductive/Obstetrics                           Anesthesia Physical Anesthesia  Plan  ASA: III  Anesthesia Plan: General   Post-op Pain Management:    Induction: Intravenous  Airway Management Planned: Oral ETT  Additional Equipment:   Intra-op Plan:   Post-operative Plan: Extubation in OR  Informed Consent: I have reviewed the patients History and Physical, chart, labs and discussed the procedure including the risks, benefits and alternatives for the proposed anesthesia with the patient or authorized representative who has indicated his/her understanding and acceptance.   Dental advisory given  Plan Discussed with: CRNA  Anesthesia Plan Comments: (Risks/benefits of general anesthesia discussed with patient including risk of damage to teeth, lips, gum, and tongue, nausea/vomiting, allergic reactions to medications, and the possibility of heart attack, stroke and death.  All patient questions answered.  Patient wishes to proceed.)        Anesthesia Quick Evaluation

## 2015-08-29 ENCOUNTER — Inpatient Hospital Stay (HOSPITAL_COMMUNITY): Payer: Medicare Other

## 2015-08-29 ENCOUNTER — Encounter (HOSPITAL_COMMUNITY): Payer: Self-pay | Admitting: Gastroenterology

## 2015-08-29 DIAGNOSIS — K832 Perforation of bile duct: Secondary | ICD-10-CM

## 2015-08-29 DIAGNOSIS — J189 Pneumonia, unspecified organism: Secondary | ICD-10-CM

## 2015-08-29 DIAGNOSIS — N179 Acute kidney failure, unspecified: Secondary | ICD-10-CM

## 2015-08-29 DIAGNOSIS — C801 Malignant (primary) neoplasm, unspecified: Secondary | ICD-10-CM | POA: Insufficient documentation

## 2015-08-29 DIAGNOSIS — K831 Obstruction of bile duct: Secondary | ICD-10-CM

## 2015-08-29 DIAGNOSIS — I5032 Chronic diastolic (congestive) heart failure: Secondary | ICD-10-CM

## 2015-08-29 DIAGNOSIS — I10 Essential (primary) hypertension: Secondary | ICD-10-CM

## 2015-08-29 DIAGNOSIS — K311 Adult hypertrophic pyloric stenosis: Secondary | ICD-10-CM

## 2015-08-29 DIAGNOSIS — K315 Obstruction of duodenum: Secondary | ICD-10-CM

## 2015-08-29 LAB — COMPREHENSIVE METABOLIC PANEL
ALBUMIN: 3.1 g/dL — AB (ref 3.5–5.0)
ALK PHOS: 353 U/L — AB (ref 38–126)
ALT: 171 U/L — ABNORMAL HIGH (ref 14–54)
ANION GAP: 15 (ref 5–15)
AST: 136 U/L — ABNORMAL HIGH (ref 15–41)
BILIRUBIN TOTAL: 0.8 mg/dL (ref 0.3–1.2)
BUN: 29 mg/dL — ABNORMAL HIGH (ref 6–20)
CALCIUM: 9.3 mg/dL (ref 8.9–10.3)
CO2: 27 mmol/L (ref 22–32)
Chloride: 102 mmol/L (ref 101–111)
Creatinine, Ser: 1.59 mg/dL — ABNORMAL HIGH (ref 0.44–1.00)
GFR, EST AFRICAN AMERICAN: 33 mL/min — AB (ref >60)
GFR, EST NON AFRICAN AMERICAN: 29 mL/min — AB (ref >60)
GLUCOSE: 121 mg/dL — AB (ref 65–99)
POTASSIUM: 4.1 mmol/L (ref 3.5–5.1)
Sodium: 144 mmol/L (ref 135–145)
TOTAL PROTEIN: 7.5 g/dL (ref 6.5–8.1)

## 2015-08-29 LAB — CBC WITH DIFFERENTIAL/PLATELET
Basophils Absolute: 0 10*3/uL (ref 0.0–0.1)
Basophils Relative: 0 %
Eosinophils Absolute: 0 10*3/uL (ref 0.0–0.7)
Eosinophils Relative: 0 %
HEMATOCRIT: 39.6 % (ref 36.0–46.0)
HEMOGLOBIN: 13.3 g/dL (ref 12.0–15.0)
LYMPHS ABS: 1.1 10*3/uL (ref 0.7–4.0)
Lymphocytes Relative: 12 %
MCH: 30.2 pg (ref 26.0–34.0)
MCHC: 33.6 g/dL (ref 30.0–36.0)
MCV: 90 fL (ref 78.0–100.0)
MONO ABS: 0.2 10*3/uL (ref 0.1–1.0)
MONOS PCT: 3 %
NEUTROS ABS: 7.8 10*3/uL — AB (ref 1.7–7.7)
NEUTROS PCT: 85 %
Platelets: 340 10*3/uL (ref 150–400)
RBC: 4.4 MIL/uL (ref 3.87–5.11)
RDW: 14.1 % (ref 11.5–15.5)
WBC: 9.2 10*3/uL (ref 4.0–10.5)

## 2015-08-29 LAB — URINE CULTURE: Culture: 20000

## 2015-08-29 LAB — PROTIME-INR
INR: 1.2 (ref 0.00–1.49)
PROTHROMBIN TIME: 15.4 s — AB (ref 11.6–15.2)

## 2015-08-29 MED ORDER — MIDAZOLAM HCL 2 MG/2ML IJ SOLN
INTRAMUSCULAR | Status: AC | PRN
  Administered 2015-08-29: 0.5 mg via INTRAVENOUS
  Administered 2015-08-29 (×3): 1 mg via INTRAVENOUS
  Administered 2015-08-29: 0.5 mg via INTRAVENOUS

## 2015-08-29 MED ORDER — SODIUM CHLORIDE 0.9 % IV SOLN: INTRAVENOUS | Status: DC

## 2015-08-29 MED ORDER — DEXTROSE-NACL 5-0.45 % IV SOLN
INTRAVENOUS | Status: DC
  Administered 2015-08-29 – 2015-09-01 (×5): via INTRAVENOUS

## 2015-08-29 MED ORDER — FENTANYL CITRATE (PF) 100 MCG/2ML IJ SOLN
INTRAMUSCULAR | Status: AC | PRN
  Administered 2015-08-29 (×2): 25 ug via INTRAVENOUS
  Administered 2015-08-29 (×2): 50 ug via INTRAVENOUS

## 2015-08-29 MED ORDER — DEXTROSE-NACL 5-0.45 % IV SOLN
INTRAVENOUS | Status: DC
  Filled 2015-08-29: qty 1000

## 2015-08-29 MED ORDER — DORZOLAMIDE HCL-TIMOLOL MAL 2-0.5 % OP SOLN
1.0000 [drp] | Freq: Two times a day (BID) | OPHTHALMIC | Status: DC
  Administered 2015-08-30 – 2015-09-06 (×15): 1 [drp] via OPHTHALMIC
  Filled 2015-08-29: qty 10

## 2015-08-29 MED ORDER — FENTANYL CITRATE (PF) 100 MCG/2ML IJ SOLN
INTRAMUSCULAR | Status: AC
  Filled 2015-08-29: qty 4

## 2015-08-29 MED ORDER — LIDOCAINE HCL 1 % IJ SOLN
INTRAMUSCULAR | Status: AC
  Filled 2015-08-29: qty 20

## 2015-08-29 MED ORDER — IOHEXOL 300 MG/ML  SOLN
25.0000 mL | Freq: Once | INTRAMUSCULAR | Status: AC | PRN
  Administered 2015-08-29: 25 mL

## 2015-08-29 MED ORDER — ENOXAPARIN SODIUM 30 MG/0.3ML ~~LOC~~ SOLN
30.0000 mg | SUBCUTANEOUS | Status: DC
  Administered 2015-08-29 – 2015-08-30 (×2): 30 mg via SUBCUTANEOUS
  Filled 2015-08-29 (×2): qty 0.3

## 2015-08-29 MED ORDER — MIDAZOLAM HCL 2 MG/2ML IJ SOLN
INTRAMUSCULAR | Status: AC
  Filled 2015-08-29: qty 6

## 2015-08-29 NOTE — Progress Notes (Signed)
PT Cancellation Note  Patient Details Name: Kim Donovan MRN: [id number] DOB: 27-Aug-1928   Cancelled Treatment:    Reason Eval/Treat Not Completed: Patient at procedure or test/unavailable. Will check back another day. Thanks.   Weston Anna, MPT Pager: [phone]

## 2015-08-29 NOTE — Progress Notes (Signed)
IP PROGRESS NOTE  Subjective:   She  Underwent placement of a drain in the right abdomen fluid collection yesterday. Bilious fluid was obtained. A drain remains in place. She underwent an upper endoscopy the confirmed a duodenal obstruction. An ERCP could not be performed.   Ms. Rowinski reports no recurrent nausea with the NG tube in place. No pain.  Objective: Vital signs in last 24 hours: Blood pressure 146/98, pulse 104, temperature 97.6 F (36.4 C), temperature source Oral, resp. rate 20, height 5\' 6"  (1.676 m), weight 136 lb 0.4 oz (61.7 kg), SpO2 95 %.  Intake/Output from previous day: 01/10 0701 - 01/11 0700 In: 1100 [I.V.:800; NG/GT:200; IV Piggyback:100] Out: 800 [Urine:100; Emesis/NG output:700]  Physical Exam:   Abdomen:  No hepatomegaly, no mass, nontender , right upper quadrant drain bag with brown liquid Vascular: No leg edema     Lab Results:  Recent Labs  08/28/15 0513 08/29/15 0530  WBC 11.3* 9.2  HGB 12.9 13.3  HCT 39.9 39.6  PLT 353 340    BMET  Recent Labs  08/28/15 0513 08/29/15 0530  NA 140 144  K 4.2 4.1  CL 99* 102  CO2 29 27  GLUCOSE 120* 121*  BUN 19 29*  CREATININE 1.17* 1.59*  CALCIUM 9.0 9.3   alkaline phosphatase 353, AST 136, ALT 171, bilirubin 0.8  Studies/Results: Ct Abdomen Wo Contrast  08/27/2015  CLINICAL DATA:  Abscess EXAM: CT ABDOMEN WITHOUT CONTRAST TECHNIQUE: Multidetector CT imaging of the abdomen was performed following the standard protocol without IV contrast. COMPARISON:  08/23/2015 FINDINGS: The stomach is severely distended with fluid. The ill-defined fluid collection in the right lower quadrant measure 6.1 x 5.3 cm. It has slightly enlarged since the prior study. Biliary dilatation persists. Tumor infiltration of the right kidney is unchanged. No left hydronephrosis. Small right pleural effusion is stable. Patchy airspace disease at the left lung base has slightly increased. No free-fluid in the abdomen.  No free  intraperitoneal gas. IMPRESSION: The stomach is severely distended. This presents an aspiration risk with moderate sedation. NG tube decompression of the stomach is recommended prior to abscess drainage. The fluid collection in question within the right lower quadrant has slightly enlarged. Stable biliary dilatation. Increasing patchy airspace disease at the left lung base. Consider aspiration pneumonia. Electronically Signed   By: Marybelle Killings M.D.   On: 08/27/2015 16:17   Dg Abd 1 View  08/28/2015  CLINICAL DATA:  Biliary obstruction. EXAM: ABDOMEN - 1 VIEW COMPARISON:  CT 08/28/2015. FINDINGS: Endoscopic tube noted. No contrast noted in the biliary system, by history patient has a duodenum stricture that did not allow cannulation of the biliary ductal system. NG tube noted . Right upper quadrant catheter noted. IMPRESSION: No contrast in the biliary system. By history patient has a duodenum stricture not allowing cannulation of the biliary system. Electronically Signed   By: Marcello Moores  Register   On: 08/28/2015 14:49   Dg Abd Portable 1v  08/27/2015  CLINICAL DATA:  82 year old female status post nasogastric tube placement. EXAM: PORTABLE ABDOMEN - 1 VIEW COMPARISON:  Abdominal radiograph 08/22/2015. FINDINGS: Nasogastric tube in position with tip in the body of the stomach. Gas, oral contrast material and stool are seen scattered throughout the colon extending to the level of the distal rectum. No pathologic distension of small bowel is noted. No gross evidence of pneumoperitoneum. Surgical clips are noted in the anatomic pelvis. IMPRESSION: 1. Tip of nasogastric tube is in the body of the stomach.  Electronically Signed   By: Vinnie Langton M.D.   On: 08/27/2015 17:33   Dg C-arm 1-60 Min-no Report  08/28/2015  CLINICAL DATA: duodenal stricture C-ARM 1-60 MINUTES Fluoroscopy was utilized by the requesting physician.  No radiographic interpretation.   Ct Image Guided Drainage By Percutaneous  Catheter  08/28/2015  CLINICAL DATA:  Right Renal neoplasm. Liver lesion, post biopsy 08/10/2015. Postprocedure development of irregular fluid collection inferior to the caudate lobe of the liver. Abdominal pain. Gastric obstruction. EXAM: CT GUIDED DRAINAGE OF PERITONEAL ABSCESS ANESTHESIA/SEDATION: Clip Intravenous Fentanyl and Versed were administered as conscious sedation during continuous cardiorespiratory monitoring by the radiology RN, with a total moderate sedation time of 16 minutes. PROCEDURE: The procedure, risks, benefits, and alternatives were explained to the patient. Questions regarding the procedure were encouraged and answered. The patient understands and consents to the procedure. Select axial scans through the mid abdomen were obtained. The collection was localized and an appropriate skin entry site determined and marked. The operative field was prepped with Betadinein a sterile fashion, and a sterile drape was applied covering the operative field. A sterile gown and sterile gloves were used for the procedure. Local anesthesia was provided with 1% Lidocaine. Under intermittent CT guidance, an 18 gauge percutaneous entry needle was advanced into the collection. Bilious fluid could be aspirated. A guidewire advanced within the collection, its position confirmed on CT. Tract dilated to facilitate placement of a 12 French pigtail catheter within the collection. A sample of the aspirate was sent for Gram stain, culture and sensitivity. Catheter secured externally with 0 Prolene suture and StatLock and placed to gravity bag. The patient tolerated the procedure well. COMPLICATIONS: None immediate FINDINGS: Limited CT confirms peritoneal fluid collection inferior to the right lobe of the liver and gallbladder fossa. 12French pigtail drain placed under CT guidance. A sample of the Bilious aspirate was sent for Gram stain, culture and sensitivity. IMPRESSION: 1. Technically successful 12 French pigtail  drain catheter into right upper quadrant peritoneal collection under CT guidance. Electronically Signed   By: Lucrezia Europe M.D.   On: 08/28/2015 13:46    Medications: I have reviewed the patient's current medications.  Assessment/Plan: 1. Right renal mass  MRI 08/03/2015 with multiple suspicious liver lesions, replacement of the right kidney consistent with an infiltrative neoplasm, probable rectoperineal adenopathy, extension of tumor into the IVC, small right pleural effusion  Staging CTs 08/08/2015 with no lung metastases, infiltrative right renal mass extending into the right renal vein and pararenal space, no lymphadenopathy, liver metastases  Ultrasound-guided biopsy of a right liver lesion 08/10/2015 with the preliminary pathology consistent with metastatic renal cell carcinoma, additional immunohistochemical histochemical stains most consistent with a poorly differentiated urothelial carcinoma  Initiation of pazopanib 08/17/2015, taken for 3 days 2. Right abdomen/flank discomfort secondary to #1  3. Family history of renal cell cancer  4. Anorexia/weight loss secondary to #1  5. Status post aortic valve replacement  6.Polymyalgia rheumatica- Embrel on hold  7.    Admission 08/20/2015 with nausea/vomiting and fever - CT abdomen/pelvis revealed new fluid attenuation surrounding the gastric antrum and hepatic flexure, stable fluid collection on a repeat CT 08/23/2015   placement of a drainage catheter 08/28/2015 with bilious fluid obtained  8.    Probable aspiration pneumonia January 2017  9.     Duodenal obstruction noted on endoscopy one 10 2017   She has been diagnosed with a duodenal obstruction , likely secondary to extrinsic compression by tumor. She also appears to have  biliary obstruction and a bile leak. The large renal tumor is likely responsible for the obstruction.   I will contact Dr. Hilarie Fredrickson to discuss treatment options. She may be a candidate for a  duodenal stent. We can consider systemic chemotherapy for the urothelial carcinoma, but I do not expect this to work quickly.   I discussed the situation with Ms. Rames. I will contact her family members.   LOS: 9 days   Betsy Coder  08/29/2015, 9:18 AM   Julieanne Manson, M.D.

## 2015-08-29 NOTE — Progress Notes (Signed)
Patient ID: Kim Donovan, female   DOB: Sep 01, 1928, 82 y.o.   MRN: [id number]    Referring Physician(s): Pyrtle,J  Chief Complaint:  Metastatic renal cell carcinoma with biliary obstruction/bile leak  Subjective: Patient doing fairly well today. Currently denies significant abdominal pain nausea or vomiting. NG tube in place.   Allergies: Tramadol  Medications: Prior to Admission medications   Medication Sig Start Date End Date Taking? Authorizing Provider  ALPRAZolam (XANAX) 0.25 MG tablet Take 1 tablet (0.25 mg total) by mouth at bedtime as needed for anxiety. 08/06/15  Yes Ladell Pier, MD  aspirin 81 MG tablet Take 81 mg by mouth daily.     Yes Historical Provider, MD  Calcium Citrate-Vitamin D 250-200 MG-UNIT TABS Take 2 tablets by mouth daily.    Yes Historical Provider, MD  Diphenhydramine-APAP, sleep, (TYLENOL PM EXTRA STRENGTH PO) Take 1 tablet by mouth at bedtime as needed (pain/sleep).    Yes Historical Provider, MD  dorzolamide-timolol (COSOPT) 22.3-6.8 MG/ML ophthalmic solution Place 1 drop into the left eye 2 (two) times daily.    Yes Historical Provider, MD  furosemide (LASIX) 20 MG tablet Take 1 tablet (20 mg total) by mouth daily. 05/15/15  Yes Larey Dresser, MD  Multiple Vitamins-Minerals (CENTRUM SILVER ULTRA WOMENS PO) Take 1 tablet by mouth daily.     Yes Historical Provider, MD  Omega-3 Fatty Acids (FISH OIL) 1000 MG CAPS Take 1 capsule by mouth daily. Reported on 08/06/2015   Yes Historical Provider, MD  pazopanib (VOTRIENT) 200 MG tablet Take 4 tablets (800 mg total) by mouth daily. Take on an empty stomach. 08/14/15  Yes Ladell Pier, MD  PREDNISONE, PAK, PO Take 2.5 mg by mouth every other day.    Yes Historical Provider, MD  ezetimibe (ZETIA) 10 MG tablet Take 1 tablet (10 mg total) by mouth daily. 05/12/14   Jolaine Artist, MD  potassium chloride (K-DUR) 10 MEQ tablet Take 1 tablet (10 mEq total) by mouth daily. Patient not taking: Reported on  08/14/2015 05/15/15   Larey Dresser, MD     Vital Signs: BP 146/98 mmHg  Pulse 104  Temp(Src) 97.6 F (36.4 C) (Oral)  Resp 20  Ht _0  (1.676 m)  Wt 136 lb 0.4 oz (61.7 kg)  BMI 21.97 kg/m2  SpO2 95%  Physical Exam patient awake, alert; chest with few bibasilar crackles; heart - sl tachy with reg rhythm / murmur; abdomen soft, positive bowel sounds, right upper quadrant drain intact, bilious output, insertion site okay, nontender. Extremities with no edema.  Imaging: Ct Abdomen Wo Contrast  08/27/2015  CLINICAL DATA:  Abscess EXAM: CT ABDOMEN WITHOUT CONTRAST TECHNIQUE: Multidetector CT imaging of the abdomen was performed following the standard protocol without IV contrast. COMPARISON:  08/23/2015 FINDINGS: The stomach is severely distended with fluid. The ill-defined fluid collection in the right lower quadrant measure 6.1 x 5.3 cm. It has slightly enlarged since the prior study. Biliary dilatation persists. Tumor infiltration of the right kidney is unchanged. No left hydronephrosis. Small right pleural effusion is stable. Patchy airspace disease at the left lung base has slightly increased. No free-fluid in the abdomen.  No free intraperitoneal gas. IMPRESSION: The stomach is severely distended. This presents an aspiration risk with moderate sedation. NG tube decompression of the stomach is recommended prior to abscess drainage. The fluid collection in question within the right lower quadrant has slightly enlarged. Stable biliary dilatation. Increasing patchy airspace disease at the left lung base. Consider aspiration  pneumonia. Electronically Signed   By: Marybelle Killings M.D.   On: 08/27/2015 16:17   Dg Abd 1 View  08/28/2015  CLINICAL DATA:  Biliary obstruction. EXAM: ABDOMEN - 1 VIEW COMPARISON:  CT 08/28/2015. FINDINGS: Endoscopic tube noted. No contrast noted in the biliary system, by history patient has a duodenum stricture that did not allow cannulation of the biliary ductal system. NG  tube noted . Right upper quadrant catheter noted. IMPRESSION: No contrast in the biliary system. By history patient has a duodenum stricture not allowing cannulation of the biliary system. Electronically Signed   By: Marcello Moores  Register   On: 08/28/2015 14:49   Dg Abd Portable 1v  08/27/2015  CLINICAL DATA:  82 year old female status post nasogastric tube placement. EXAM: PORTABLE ABDOMEN - 1 VIEW COMPARISON:  Abdominal radiograph 08/22/2015. FINDINGS: Nasogastric tube in position with tip in the body of the stomach. Gas, oral contrast material and stool are seen scattered throughout the colon extending to the level of the distal rectum. No pathologic distension of small bowel is noted. No gross evidence of pneumoperitoneum. Surgical clips are noted in the anatomic pelvis. IMPRESSION: 1. Tip of nasogastric tube is in the body of the stomach. Electronically Signed   By: Vinnie Langton M.D.   On: 08/27/2015 17:33   Dg C-arm 1-60 Min-no Report  08/28/2015  CLINICAL DATA: duodenal stricture C-ARM 1-60 MINUTES Fluoroscopy was utilized by the requesting physician.  No radiographic interpretation.   Ct Image Guided Drainage By Percutaneous Catheter  08/28/2015  CLINICAL DATA:  Right Renal neoplasm. Liver lesion, post biopsy 08/10/2015. Postprocedure development of irregular fluid collection inferior to the caudate lobe of the liver. Abdominal pain. Gastric obstruction. EXAM: CT GUIDED DRAINAGE OF PERITONEAL ABSCESS ANESTHESIA/SEDATION: Clip Intravenous Fentanyl and Versed were administered as conscious sedation during continuous cardiorespiratory monitoring by the radiology RN, with a total moderate sedation time of 16 minutes. PROCEDURE: The procedure, risks, benefits, and alternatives were explained to the patient. Questions regarding the procedure were encouraged and answered. The patient understands and consents to the procedure. Select axial scans through the mid abdomen were obtained. The collection was  localized and an appropriate skin entry site determined and marked. The operative field was prepped with Betadinein a sterile fashion, and a sterile drape was applied covering the operative field. A sterile gown and sterile gloves were used for the procedure. Local anesthesia was provided with 1% Lidocaine. Under intermittent CT guidance, an 18 gauge percutaneous entry needle was advanced into the collection. Bilious fluid could be aspirated. A guidewire advanced within the collection, its position confirmed on CT. Tract dilated to facilitate placement of a 12 French pigtail catheter within the collection. A sample of the aspirate was sent for Gram stain, culture and sensitivity. Catheter secured externally with 0 Prolene suture and StatLock and placed to gravity bag. The patient tolerated the procedure well. COMPLICATIONS: None immediate FINDINGS: Limited CT confirms peritoneal fluid collection inferior to the right lobe of the liver and gallbladder fossa. 12French pigtail drain placed under CT guidance. A sample of the Bilious aspirate was sent for Gram stain, culture and sensitivity. IMPRESSION: 1. Technically successful 12 French pigtail drain catheter into right upper quadrant peritoneal collection under CT guidance. Electronically Signed   By: Lucrezia Europe M.D.   On: 08/28/2015 13:46    Labs:  CBC:  Recent Labs  08/26/15 0405 08/27/15 0540 08/28/15 0513 08/29/15 0530  WBC 7.7 10.1 11.3* 9.2  HGB 12.1 12.7 12.9 13.3  HCT 36.6 38.7  39.9 39.6  PLT 331 342 353 340    COAGS:  Recent Labs  08/10/15 0715 08/20/15 0510  INR 0.99 1.24  APTT  --  38*    BMP:  Recent Labs  08/26/15 0405 08/27/15 0540 08/28/15 0513 08/29/15 0530  NA 138 138 140 144  K 4.0 4.0 4.2 4.1  CL 100* 98* 99* 102  CO2 _0 GLUCOSE 101* 122* 120* 121*  BUN _1 29*  CALCIUM 8.8* 9.0 9.0 9.3  CREATININE 1.09* 1.16* 1.17* 1.59*  GFRNONAA 45* 42* 42* 29*  GFRAA 52* 49* 48* 33*    LIVER  FUNCTION TESTS:  Recent Labs  08/25/15 0430 08/27/15 0540 08/28/15 0513 08/29/15 0530  BILITOT 1.2 1.7* 2.1* 0.8  AST 56* 149* 241* 136*  ALT 42 98* 199* 171*  ALKPHOS 196* 352* 412* 353*  PROT 6.8 6.9 7.3 7.5  ALBUMIN 2.7* 2.7* 2.8* 3.1*    Assessment and Plan: Patient with history of metastatic renal cell carcinoma, biliary obstruction and drainage of right upper quadrant fluid collection? biloma 08/27/14. Status post EGD 1/10 which revealed severe stricture in the second portion of the duodenum, stricture at GE junction and large volume of food residue in the gastric fundus and gastric body. ERCP not attempted. NG tube currently in place. Patient afebrile, WBC and hemoglobin normal, creatinine 1.59(1.17), LFTs decreased since right upper quadrant drain placement, drain fluid cultures pending. Case reviewed again with Dr. Laurence Ferrari and Dr. Hilarie Fredrickson. Request received for PTC with biliary drain placement as well as injection of existing right upper quadrant drain today. Details/risks of above procedure, including but not limited to, internal bleeding, infection/sepsis, injury to adjacent organs, need for prolonged catheter drainage discussed with patient/family with their understanding and consent. Case planned for later this afternoon. Duodenal stent planned per GI. IV hydration.    Signed: D. Rowe Robert 08/29/2015, 9:55 AM   I spent a total of 15 minutes at the the patient's bedside AND on the patient's hospital floor or unit, greater than 50% of which was counseling/coordinating care for [facility] with biliary drainage/right upper quadrant abdominal drain

## 2015-08-29 NOTE — Procedures (Signed)
Interventional Radiology Procedure Note  Procedure:  1.) Drainage catheter injection 2.) PTC and placement of a 19F internal/external biliary drain  Complications: None  Estimated Blood Loss: <25 mL  Recommendations: - Biliary drain to bag until Br decreased sufficiently.  Cap biliary drain BEFORE D/C. - RUQ drain observation, once bilious output significantly decreased, this drain can be removed.  If in place at time of D/C, will need out-pt f/u at IR drain clinic.   Signed,  Criselda Peaches, MD

## 2015-08-29 NOTE — Progress Notes (Addendum)
PROGRESS NOTE  Kim Donovan HXT:[id number] DOB: [birth year] DOA: 08/19/2015 PCP: Mathews Argyle, MD   HPI: 82 yo F with recently diagnosed metastatic renal cell carcinoma (preliminary pathology), additional immunohistochemical histochemical stains most consistent with a poorly differentiated urothelial carcinoma, admitted on 1/2 with nausea and vomiting, found to have biliary and duodenal obstruction.   Subjective / 24 H Interval events - she is feeling better this morning, no longer with nausea and vomiting since NG tube was placed  Assessment/Plan: Principal Problem:   Sepsis ([place]) Active Problems:   GLAUCOMA   Polymyalgia rheumatica (HCC)   S/P AVR (aortic valve replacement)   Chronic diastolic CHF (congestive heart failure) (HCC)   Renal mass, right   Liver masses   AKI (acute kidney injury) (HCC)   Nausea and vomiting   Hyponatremia   Essential hypertension   Intraabdominal fluid collection   Hepatic metastases (HCC)   CAP (community acquired pneumonia)   Elevated lipase   Transaminitis   Fever, unspecified   Abdominal fluid collection   Metastatic renal cell carcinoma (HCC)   Elevated LFTs   Acquired dilation of bile duct   Gastric outlet obstruction   Abnormal CT scan, stomach   Biliary obstruction   Bile duct rupture   Biliary obstruction due to malignant neoplasm   Sepsis due to probable aspiration pneumonia - CT scan 1/6 with left lower lobe infiltrate - on Zosyn, started on 1/2, continue until biliary cultures remain negative  Stage IV urothelial carcinoma, with liver mets - Dr. Benay Spice following, appreciate input. Discussed with him over the phone today   Biliary obstruction - CT scan on 1/6 with biliary dilatation, "Stable intrahepatic biliary dilatation with an abrupt cut off of the common bile duct" - elevated Alk phos, LFTs and bilirubin. Interestingly, today's LFTs are improving after drain placed in the RUQ peritoneal fluid collection, I  wonder whether there is communication (leak) vs mechanical compression of fluid collection on the CBD itself.  - GI consulted, appreciate input. ERCP attempted 1/10, unable to pass scope past duodenal obstruction. - IR plans for percutaneous stenting of CBD  Duodenal obstruction  - probably needs duodenal stenting after IR stents biliary system.  - another option would be PEG-J for venting and feeding if stenting unsuccesful  RUQ peritoneal fluid collection - s/p drain placement per IR on 1/10, draining bilious fluid - cultures without growth so far, pending  AKI on CKD III - suspect pre-renal on top of CKD, it appears that her GFR in 2016 was ~40, prior to that in 2012 within normal limits - IVF, repeat renal function in am   Nausea/vomiting - NG tube placement 1/9, continue as long as she has duodenal obstruction - continue supportive treatment  HTN - stable  PMR  S/p bioprosthetic AVR  Chronic diastolic CHF - watch fluid status carefully, 2D echo 04/2014 with normal EF 16-55%, grade 1 diastolic dysfunction   Diet: Diet NPO time specified Fluids: D5 1/2 NS DVT Prophylaxis: Lovenox  Code Status: DNR Family Communication: d/w niece (Dr. [name]) bedside Disposition Plan: home when ready   Barriers to discharge: multiple procedures planned  Consultants:  IR  GI  Oncology   Procedures:  EGD 1/10 ENDOSCOPIC IMPRESSION: 1. Severe stricture in the 2nd part of the duodenum, ERCP not attempted 2. Large volume of food residue in the gastric fundus and gastric body 3. Stricture at the gastroesophageal junction   Antibiotics Vancomycin 08/20/15>08/23/15 Zosyn 08/20/15>08/24/15, 08/27/15>> Zithromax 08/22/15>08/24/15 Levaquin 08/24/15> 08/26/2015  Studies  Ct Abdomen Wo Contrast  16-Sep-2015  CLINICAL DATA:  Abscess EXAM: CT ABDOMEN WITHOUT CONTRAST TECHNIQUE: Multidetector CT imaging of the abdomen was performed following the standard protocol without IV contrast.  COMPARISON:  08/23/2015 FINDINGS: The stomach is severely distended with fluid. The ill-defined fluid collection in the right lower quadrant measure 6.1 x 5.3 cm. It has slightly enlarged since the prior study. Biliary dilatation persists. Tumor infiltration of the right kidney is unchanged. No left hydronephrosis. Small right pleural effusion is stable. Patchy airspace disease at the left lung base has slightly increased. No free-fluid in the abdomen.  No free intraperitoneal gas. IMPRESSION: The stomach is severely distended. This presents an aspiration risk with moderate sedation. NG tube decompression of the stomach is recommended prior to abscess drainage. The fluid collection in question within the right lower quadrant has slightly enlarged. Stable biliary dilatation. Increasing patchy airspace disease at the left lung base. Consider aspiration pneumonia. Electronically Signed   By: Marybelle Killings M.D.   On: 09-16-15 16:17   Dg Abd 1 View  08/28/2015  CLINICAL DATA:  Biliary obstruction. EXAM: ABDOMEN - 1 VIEW COMPARISON:  CT 08/28/2015. FINDINGS: Endoscopic tube noted. No contrast noted in the biliary system, by history patient has a duodenum stricture that did not allow cannulation of the biliary ductal system. NG tube noted . Right upper quadrant catheter noted. IMPRESSION: No contrast in the biliary system. By history patient has a duodenum stricture not allowing cannulation of the biliary system. Electronically Signed   By: Marcello Moores  Register   On: 08/28/2015 14:49   Dg Abd Portable 1v  2015-09-16  CLINICAL DATA:  82 year old female status post nasogastric tube placement. EXAM: PORTABLE ABDOMEN - 1 VIEW COMPARISON:  Abdominal radiograph 08/22/2015. FINDINGS: Nasogastric tube in position with tip in the body of the stomach. Gas, oral contrast material and stool are seen scattered throughout the colon extending to the level of the distal rectum. No pathologic distension of small bowel is noted. No gross  evidence of pneumoperitoneum. Surgical clips are noted in the anatomic pelvis. IMPRESSION: 1. Tip of nasogastric tube is in the body of the stomach. Electronically Signed   By: Vinnie Langton M.D.   On: 09/16/15 17:33   Dg C-arm 1-60 Min-no Report  08/28/2015  CLINICAL DATA: duodenal stricture C-ARM 1-60 MINUTES Fluoroscopy was utilized by the requesting physician.  No radiographic interpretation.   Ct Image Guided Drainage By Percutaneous Catheter  08/28/2015  CLINICAL DATA:  Right Renal neoplasm. Liver lesion, post biopsy 08/10/2015. Postprocedure development of irregular fluid collection inferior to the caudate lobe of the liver. Abdominal pain. Gastric obstruction. EXAM: CT GUIDED DRAINAGE OF PERITONEAL ABSCESS ANESTHESIA/SEDATION: Clip Intravenous Fentanyl and Versed were administered as conscious sedation during continuous cardiorespiratory monitoring by the radiology RN, with a total moderate sedation time of 16 minutes. PROCEDURE: The procedure, risks, benefits, and alternatives were explained to the patient. Questions regarding the procedure were encouraged and answered. The patient understands and consents to the procedure. Select axial scans through the mid abdomen were obtained. The collection was localized and an appropriate skin entry site determined and marked. The operative field was prepped with Betadinein a sterile fashion, and a sterile drape was applied covering the operative field. A sterile gown and sterile gloves were used for the procedure. Local anesthesia was provided with 1% Lidocaine. Under intermittent CT guidance, an 18 gauge percutaneous entry needle was advanced into the collection. Bilious fluid could be aspirated. A guidewire advanced within the  collection, its position confirmed on CT. Tract dilated to facilitate placement of a 12 French pigtail catheter within the collection. A sample of the aspirate was sent for Gram stain, culture and sensitivity. Catheter secured  externally with 0 Prolene suture and StatLock and placed to gravity bag. The patient tolerated the procedure well. COMPLICATIONS: None immediate FINDINGS: Limited CT confirms peritoneal fluid collection inferior to the right lobe of the liver and gallbladder fossa. 12French pigtail drain placed under CT guidance. A sample of the Bilious aspirate was sent for Gram stain, culture and sensitivity. IMPRESSION: 1. Technically successful 12 French pigtail drain catheter into right upper quadrant peritoneal collection under CT guidance. Electronically Signed   By: Lucrezia Europe M.D.   On: 08/28/2015 13:46    Objective  Filed Vitals:   08/29/15 1156 08/29/15 1202 08/29/15 1206 08/29/15 1212  BP: 152/93 176/118 159/99 153/101  Pulse: 106 111 104 104  Temp:      TempSrc:      Resp: _0 Height:      Weight:      SpO2: 98% 99% 96% 98%    Intake/Output Summary (Last 24 hours) at 08/29/15 1259 Last data filed at 08/29/15 0600  Gross per 24 hour  Intake   1100 ml  Output    800 ml  Net    300 ml   Filed Weights   08/20/15 0431  Weight: 61.7 kg (136 lb 0.4 oz)    Exam:  GENERAL: NAD, pleasant, NG tube in place  HEENT: no scleral icterus, PERRL  NECK: supple  LUNGS: CTA biL, no wheezing  HEART: RRR without MRG  ABDOMEN: soft, non tender  EXTREMITIES: no clubbing / cyanosis  NEUROLOGIC: non focal  PSYCHIATRIC: normal mood and affect   Data Reviewed: Basic Metabolic Panel:  Recent Labs Lab 08/25/15 0430 08/26/15 0405 08/27/15 0540 08/28/15 0513 08/29/15 0530  NA 137 138 138 140 144  K 4.0 4.0 4.0 4.2 4.1  CL 101 100* 98* 99* 102  CO2 _1 GLUCOSE 110* 101* 122* 120* 121*  BUN _2 29*  CREATININE 1.03* 1.09* 1.16* 1.17* 1.59*  CALCIUM 8.6* 8.8* 9.0 9.0 9.3   Liver Function Tests:  Recent Labs Lab 08/24/15 0440 08/25/15 0430 08/27/15 0540 08/28/15 0513 08/29/15 0530  AST 37 56* 149* 241* 136*  ALT 32 42 98* 199* 171*  ALKPHOS 111  196* 352* 412* 353*  BILITOT 0.6 1.2 1.7* 2.1* 0.8  PROT 6.3* 6.8 6.9 7.3 7.5  ALBUMIN 2.4* 2.7* 2.7* 2.8* 3.1*    Recent Labs Lab 08/26/15 0405 08/27/15 0540 08/28/15 0513  LIPASE 61* 49 46  AMYLASE 113* 112* 99   CBC:  Recent Labs Lab 08/24/15 0440 08/25/15 0430 08/26/15 0405 08/27/15 0540 08/28/15 0513 08/29/15 0530  WBC 6.2 10.8* 7.7 10.1 11.3* 9.2  NEUTROABS 3.9  --   --  7.5 8.2* 7.8*  HGB 11.8* 12.5 12.1 12.7 12.9 13.3  HCT 35.5* 38.1 36.6 38.7 39.9 39.6  MCV 89.6 88.8 89.9 90.2 91.3 90.0  PLT 302 336 331 342 353 340   BNP (last 3 results)  Recent Labs  05/15/15 1115 06/15/15 0955  BNP 111.4* 100.1*   Recent Results (from the past 240 hour(s))  Blood culture (routine x 2)     Status: None   Collection Time: 08/20/15 12:01 AM  Result Value Ref Range Status   Specimen Description BLOOD RIGHT ANTECUBITAL  Final   Special  Requests BOTTLES DRAWN AEROBIC AND ANAEROBIC 6ML  Final   Culture   Final    NO GROWTH 5 DAYS Performed at [facility]    Report Status 08/25/2015 FINAL  Final  Blood culture (routine x 2)     Status: None   Collection Time: 08/20/15  3:05 AM  Result Value Ref Range Status   Specimen Description BLOOD BLOOD RIGHT FOREARM  Final   Special Requests BOTTLES DRAWN AEROBIC AND ANAEROBIC 6ML  Final   Culture   Final    NO GROWTH 5 DAYS Performed at [facility]    Report Status 08/25/2015 FINAL  Final  Culture, blood (routine x 2)     Status: None (Preliminary result)   Collection Time: 08/27/15 12:55 PM  Result Value Ref Range Status   Specimen Description BLOOD LEFT ARM  Final   Special Requests BOTTLES DRAWN AEROBIC AND ANAEROBIC 10CC  Final   Culture   Final    NO GROWTH 2 DAYS Performed at [facility]    Report Status PENDING  Incomplete  Culture, blood (routine x 2)     Status: None (Preliminary result)   Collection Time: 08/27/15  1:10 PM  Result Value Ref Range Status   Specimen Description BLOOD  RIGHT HAND  Final   Special Requests IN PEDIATRIC BOTTLE [place]  Final   Culture   Final    NO GROWTH 2 DAYS Performed at [facility]    Report Status PENDING  Incomplete  Culture, Urine     Status: None   Collection Time: 08/28/15 12:25 AM  Result Value Ref Range Status   Specimen Description URINE, RANDOM  Final   Special Requests NONE  Final   Culture   Final    20,000 COLONIES/mL YEAST Performed at [facility]    Report Status 08/29/2015 FINAL  Final  Culture, routine-abscess     Status: None (Preliminary result)   Collection Time: 08/28/15 12:58 PM  Result Value Ref Range Status   Specimen Description DRAINAGE RUQ CT DRAIN  Final   Special Requests NONE  Final   Gram Stain PENDING  Incomplete   Culture   Final    NO GROWTH 1 DAY Performed at [facility]    Report Status PENDING  Incomplete     Scheduled Meds: . antiseptic oral rinse  7 mL Mouth Rinse BID  . dorzolamide-timolol  1 drop Left Eye BID  . enoxaparin (LOVENOX) injection  30 mg Subcutaneous Q24H  . fentaNYL      . indomethacin  100 mg Rectal Once  . lactose free nutrition  237 mL Oral TID WC  . lidocaine      . midazolam      . pantoprazole  40 mg Oral QAC breakfast  . piperacillin-tazobactam  3.375 g Intravenous Q8H  . sodium chloride  1,000 mL Intravenous Once  . sodium chloride  3 mL Intravenous Q12H   Continuous Infusions: . dextrose 5 % and 0.45% NaCl 75 mL/hr at 08/29/15 [place], MD [facility] Pager [phone]. If 7 PM - 7 AM, please contact night-coverage at [email or web address], password [facility] 08/29/2015, 12:59 PM  LOS: 9 days

## 2015-08-29 NOTE — Progress Notes (Signed)
Progress Note   Subjective  External drain placed yesterday by IR into abd fluid collection, draining bilious fluid No abd pain NGT remains in place No nausea today EGD yesterday with attempted ERCP (all studies reviewed including with the patient)    Objective  Vital signs in last 24 hours: Temp:  [97.6 F (36.4 C)-98.3 F (36.8 C)] 97.6 F (36.4 C) (01/11 0523) Pulse Rate:  [93-106] 104 (01/11 0523) Resp:  [16-25] 20 (01/11 0523) BP: (117-158)/(68-98) 146/98 mmHg (01/11 0523) SpO2:  [93 %-100 %] 95 % (01/11 0523) Last BM Date: 08/26/15 Gen: awake, alert, NAD HEENT: anicteric, op clear, NG in place CV: RRR, no mrg Pulm: CTA b/l Abd: soft, mildly right abd tenderness without rebound or guarding, ND, +BS throughout Ext: no c/c, trace pretib edema Neuro: nonfocal   Intake/Output from previous day: 01/10 0701 - 01/11 0700 In: 1100 [I.V.:800; NG/GT:200; IV Piggyback:100] Out: 800 [Urine:100; Emesis/NG output:700] Intake/Output this shift:    Lab Results:  Recent Labs  08/27/15 0540 08/28/15 0513 08/29/15 0530  WBC 10.1 11.3* 9.2  HGB 12.7 12.9 13.3  HCT 38.7 39.9 39.6  PLT 342 353 340   BMET  Recent Labs  08/27/15 0540 08/28/15 0513 08/29/15 0530  NA 138 140 144  K 4.0 4.2 4.1  CL 98* 99* 102  CO2 31 29 27   GLUCOSE 122* 120* 121*  BUN 19 19 29*  CREATININE 1.16* 1.17* 1.59*  CALCIUM 9.0 9.0 9.3   LFT  Recent Labs  08/29/15 0530  PROT 7.5  ALBUMIN 3.1*  AST 136*  ALT 171*  ALKPHOS 353*  BILITOT 0.8    Studies/Results: Ct Abdomen Wo Contrast  08/27/2015  CLINICAL DATA:  Abscess EXAM: CT ABDOMEN WITHOUT CONTRAST TECHNIQUE: Multidetector CT imaging of the abdomen was performed following the standard protocol without IV contrast. COMPARISON:  08/23/2015 FINDINGS: The stomach is severely distended with fluid. The ill-defined fluid collection in the right lower quadrant measure 6.1 x 5.3 cm. It has slightly enlarged since the prior study.  Biliary dilatation persists. Tumor infiltration of the right kidney is unchanged. No left hydronephrosis. Small right pleural effusion is stable. Patchy airspace disease at the left lung base has slightly increased. No free-fluid in the abdomen.  No free intraperitoneal gas. IMPRESSION: The stomach is severely distended. This presents an aspiration risk with moderate sedation. NG tube decompression of the stomach is recommended prior to abscess drainage. The fluid collection in question within the right lower quadrant has slightly enlarged. Stable biliary dilatation. Increasing patchy airspace disease at the left lung base. Consider aspiration pneumonia. Electronically Signed   By: Marybelle Killings M.D.   On: 08/27/2015 16:17   Dg Abd 1 View  08/28/2015  CLINICAL DATA:  Biliary obstruction. EXAM: ABDOMEN - 1 VIEW COMPARISON:  CT 08/28/2015. FINDINGS: Endoscopic tube noted. No contrast noted in the biliary system, by history patient has a duodenum stricture that did not allow cannulation of the biliary ductal system. NG tube noted . Right upper quadrant catheter noted. IMPRESSION: No contrast in the biliary system. By history patient has a duodenum stricture not allowing cannulation of the biliary system. Electronically Signed   By: Marcello Moores  Register   On: 08/28/2015 14:49   Dg Abd Portable 1v  08/27/2015  CLINICAL DATA:  82 year old female status post nasogastric tube placement. EXAM: PORTABLE ABDOMEN - 1 VIEW COMPARISON:  Abdominal radiograph 08/22/2015. FINDINGS: Nasogastric tube in position with tip in the body of the stomach. Gas, oral contrast material  and stool are seen scattered throughout the colon extending to the level of the distal rectum. No pathologic distension of small bowel is noted. No gross evidence of pneumoperitoneum. Surgical clips are noted in the anatomic pelvis. IMPRESSION: 1. Tip of nasogastric tube is in the body of the stomach. Electronically Signed   By: Vinnie Langton M.D.   On:  08/27/2015 17:33   Dg C-arm 1-60 Min-no Report  08/28/2015  CLINICAL DATA: duodenal stricture C-ARM 1-60 MINUTES Fluoroscopy was utilized by the requesting physician.  No radiographic interpretation.   Ct Image Guided Drainage By Percutaneous Catheter  08/28/2015  CLINICAL DATA:  Right Renal neoplasm. Liver lesion, post biopsy 08/10/2015. Postprocedure development of irregular fluid collection inferior to the caudate lobe of the liver. Abdominal pain. Gastric obstruction. EXAM: CT GUIDED DRAINAGE OF PERITONEAL ABSCESS ANESTHESIA/SEDATION: Clip Intravenous Fentanyl and Versed were administered as conscious sedation during continuous cardiorespiratory monitoring by the radiology RN, with a total moderate sedation time of 16 minutes. PROCEDURE: The procedure, risks, benefits, and alternatives were explained to the patient. Questions regarding the procedure were encouraged and answered. The patient understands and consents to the procedure. Select axial scans through the mid abdomen were obtained. The collection was localized and an appropriate skin entry site determined and marked. The operative field was prepped with Betadinein a sterile fashion, and a sterile drape was applied covering the operative field. A sterile gown and sterile gloves were used for the procedure. Local anesthesia was provided with 1% Lidocaine. Under intermittent CT guidance, an 18 gauge percutaneous entry needle was advanced into the collection. Bilious fluid could be aspirated. A guidewire advanced within the collection, its position confirmed on CT. Tract dilated to facilitate placement of a 12 French pigtail catheter within the collection. A sample of the aspirate was sent for Gram stain, culture and sensitivity. Catheter secured externally with 0 Prolene suture and StatLock and placed to gravity bag. The patient tolerated the procedure well. COMPLICATIONS: None immediate FINDINGS: Limited CT confirms peritoneal fluid collection  inferior to the right lobe of the liver and gallbladder fossa. 12French pigtail drain placed under CT guidance. A sample of the Bilious aspirate was sent for Gram stain, culture and sensitivity. IMPRESSION: 1. Technically successful 12 French pigtail drain catheter into right upper quadrant peritoneal collection under CT guidance. Electronically Signed   By: Lucrezia Europe M.D.   On: 08/28/2015 13:46      Assessment & Plan  82 yo with metastatic urothelial cancer admitted with nausea, vomiting and fever found to have biliary obstruction and duodenal obstruction  1. Biliary obstruction/intra-abdominal fluid collection -- right middle/lower quadrant fluid collection drained yesterday with external drain by IR. Fluid bilious in nature with improvement in liver enzymes and normalization of bilirubin since external drain placed. This study was reviewed today with Dr. Laurence Ferrari -- very likely that she had rupture of biliary tree secondary to malignancy with formation of bilious intra-abdominal fluid collection. This is now draining externally resulting in improving liver enzymes. --Patient needs durable biliary drain/decompression, unable to be accessed by ERCP due to duodenal stricture. Discussed with Dr. Laurence Ferrari who will plan external/internal biliary access and hopefully stent placement. If successful this would likely resolve bile leak (eventually abdominal fluid collection should resolve allowing removal of intra-abdominal drain). --Once definitive biliary drain/stent/decompression then we can consider duodenal stenting for extrinsic malignancy causing duodenal obstruction --I discussed this at length with the patient and her niece, [name]  2. Duodenal obstruction -- external compression causing significant luminal  narrowing at D1 /D2 junction.  Endoluminal stenting is an option: we discussed risks which would be stent migration, pain, bleeding and perforation.  Duodenal stenting will be deferred  until definitive biliary stent/drain established.  This was also discussed at length with Dr. Laurence Ferrari and patient/family.  Patient and family understand that endoluminal stenting is palliative.   3. Metastatic urothelial carcinoma -- oncology treatment decisions per Dr. Benay Spice   Principal Problem:   Sepsis [facility]) Active Problems:   GLAUCOMA   Polymyalgia rheumatica ([place])   S/P AVR (aortic valve replacement)   Chronic diastolic CHF (congestive heart failure) ([place])   Renal mass, right   Liver masses   AKI (acute kidney injury) ([place])   Nausea and vomiting   Hyponatremia   Essential hypertension   Intraabdominal fluid collection   Hepatic metastases ([place])   CAP (community acquired pneumonia)   Elevated lipase   Transaminitis   Fever, unspecified   Abdominal fluid collection   Metastatic renal cell carcinoma (HCC)   Elevated LFTs   Acquired dilation of bile duct   Gastric outlet obstruction   Abnormal CT scan, stomach     LOS: 9 days   Jakyrah Holladay M  08/29/2015, 9:35 AM [id number] 8a-5p weekdays [phone] after 5p, weekends, holidays

## 2015-08-30 LAB — CBC
HEMATOCRIT: 37.7 % (ref 36.0–46.0)
HEMOGLOBIN: 12.1 g/dL (ref 12.0–15.0)
MCH: 29.6 pg (ref 26.0–34.0)
MCHC: 32.1 g/dL (ref 30.0–36.0)
MCV: 92.2 fL (ref 78.0–100.0)
PLATELETS: 344 10*3/uL (ref 150–400)
RBC: 4.09 MIL/uL (ref 3.87–5.11)
RDW: 14.7 % (ref 11.5–15.5)
WBC: 9.8 10*3/uL (ref 4.0–10.5)

## 2015-08-30 LAB — COMPREHENSIVE METABOLIC PANEL
ALT: 110 U/L — AB (ref 14–54)
AST: 62 U/L — AB (ref 15–41)
Albumin: 2.7 g/dL — ABNORMAL LOW (ref 3.5–5.0)
Alkaline Phosphatase: 254 U/L — ABNORMAL HIGH (ref 38–126)
Anion gap: 14 (ref 5–15)
BUN: 40 mg/dL — ABNORMAL HIGH (ref 6–20)
CHLORIDE: 99 mmol/L — AB (ref 101–111)
CO2: 27 mmol/L (ref 22–32)
CREATININE: 1.58 mg/dL — AB (ref 0.44–1.00)
Calcium: 8.9 mg/dL (ref 8.9–10.3)
GFR calc non Af Amer: 29 mL/min — ABNORMAL LOW (ref >60)
GFR, EST AFRICAN AMERICAN: 34 mL/min — AB (ref >60)
Glucose, Bld: 116 mg/dL — ABNORMAL HIGH (ref 65–99)
Potassium: 3.5 mmol/L (ref 3.5–5.1)
SODIUM: 140 mmol/L (ref 135–145)
Total Bilirubin: 1.2 mg/dL (ref 0.3–1.2)
Total Protein: 6.7 g/dL (ref 6.5–8.1)

## 2015-08-30 MED ORDER — PANTOPRAZOLE SODIUM 40 MG IV SOLR
40.0000 mg | INTRAVENOUS | Status: DC
  Administered 2015-08-30 – 2015-09-04 (×5): 40 mg via INTRAVENOUS
  Filled 2015-08-30 (×5): qty 40

## 2015-08-30 NOTE — Progress Notes (Signed)
Patient ID: Kim Donovan, female   DOB: 12/08/1928, 82 y.o.   MRN: [id number]    Referring Physician(s): Sherrill/Pyrtle  Chief Complaint:  Metastatic renal cell cancer , biliary obstruction, biloma  Subjective:  Pt sitting up in chair; has ambulated in hallway today; feels"ok"; denies N/V; NG tube in place; some soreness at biliary drain site  Allergies: Tramadol  Medications: Prior to Admission medications   Medication Sig Start Date End Date Taking? Authorizing Provider  ALPRAZolam (XANAX) 0.25 MG tablet Take 1 tablet (0.25 mg total) by mouth at bedtime as needed for anxiety. 08/06/15  Yes Ladell Pier, MD  aspirin 81 MG tablet Take 81 mg by mouth daily.     Yes Historical Provider, MD  Calcium Citrate-Vitamin D 250-200 MG-UNIT TABS Take 2 tablets by mouth daily.    Yes Historical Provider, MD  Diphenhydramine-APAP, sleep, (TYLENOL PM EXTRA STRENGTH PO) Take 1 tablet by mouth at bedtime as needed (pain/sleep).    Yes Historical Provider, MD  dorzolamide-timolol (COSOPT) 22.3-6.8 MG/ML ophthalmic solution Place 1 drop into the left eye 2 (two) times daily.    Yes Historical Provider, MD  furosemide (LASIX) 20 MG tablet Take 1 tablet (20 mg total) by mouth daily. 05/15/15  Yes Larey Dresser, MD  Multiple Vitamins-Minerals (CENTRUM SILVER ULTRA WOMENS PO) Take 1 tablet by mouth daily.     Yes Historical Provider, MD  Omega-3 Fatty Acids (FISH OIL) 1000 MG CAPS Take 1 capsule by mouth daily. Reported on 08/06/2015   Yes Historical Provider, MD  pazopanib (VOTRIENT) 200 MG tablet Take 4 tablets (800 mg total) by mouth daily. Take on an empty stomach. 08/14/15  Yes Ladell Pier, MD  PREDNISONE, PAK, PO Take 2.5 mg by mouth every other day.    Yes Historical Provider, MD  ezetimibe (ZETIA) 10 MG tablet Take 1 tablet (10 mg total) by mouth daily. 05/12/14   Jolaine Artist, MD  potassium chloride (K-DUR) 10 MEQ tablet Take 1 tablet (10 mEq total) by mouth daily. Patient not  taking: Reported on 08/14/2015 05/15/15   Larey Dresser, MD     Vital Signs: BP 132/86 mmHg  Pulse 74  Temp(Src) 97.5 F (36.4 C) (Oral)  Resp 20  Ht _0  (1.676 m)  Wt 136 lb 0.4 oz (61.7 kg)  BMI 21.97 kg/m2  SpO2 98%  Physical Exam biliary drain, RUQ drain intact, insertion sites mildly tender, dressings dry, mildly tender to palpation, outputs 300 cc bile; cx's neg to date  Imaging: Ct Abdomen Wo Contrast  08/27/2015  CLINICAL DATA:  Abscess EXAM: CT ABDOMEN WITHOUT CONTRAST TECHNIQUE: Multidetector CT imaging of the abdomen was performed following the standard protocol without IV contrast. COMPARISON:  08/23/2015 FINDINGS: The stomach is severely distended with fluid. The ill-defined fluid collection in the right lower quadrant measure 6.1 x 5.3 cm. It has slightly enlarged since the prior study. Biliary dilatation persists. Tumor infiltration of the right kidney is unchanged. No left hydronephrosis. Small right pleural effusion is stable. Patchy airspace disease at the left lung base has slightly increased. No free-fluid in the abdomen.  No free intraperitoneal gas. IMPRESSION: The stomach is severely distended. This presents an aspiration risk with moderate sedation. NG tube decompression of the stomach is recommended prior to abscess drainage. The fluid collection in question within the right lower quadrant has slightly enlarged. Stable biliary dilatation. Increasing patchy airspace disease at the left lung base. Consider aspiration pneumonia. Electronically Signed   By: Arnell Sieving  Hoss M.D.   On: 08/27/2015 16:17   Dg Abd 1 View  08/28/2015  CLINICAL DATA:  Biliary obstruction. EXAM: ABDOMEN - 1 VIEW COMPARISON:  CT 08/28/2015. FINDINGS: Endoscopic tube noted. No contrast noted in the biliary system, by history patient has a duodenum stricture that did not allow cannulation of the biliary ductal system. NG tube noted . Right upper quadrant catheter noted. IMPRESSION: No contrast in the  biliary system. By history patient has a duodenum stricture not allowing cannulation of the biliary system. 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 Additionally, she has a biliary leak. She requires percutaneous transhepatic cholangiogram with attempted placement of an internal/ external biliary drainage catheter both for biliary decompression and for bile diversion. EXAM: IR INT-EXT BILIARY DRAIN W/ CHOLANGIOGRAM; SINUS TRACT INJECTION/FISTULOGRAM Date: 08/29/2015 PROCEDURE: 1. Contrast injection through existing drainage catheter 2. Ultrasound-guided puncture of the left biliary tree 3. Placement of an internal/external biliary drainage catheter using fluoroscopic guidance Interventional Radiologist:  Criselda Peaches, MD ANESTHESIA/SEDATION: Moderate (conscious) sedation was used. 4 mg Versed, 150 mcg Fentanyl were administered intravenously. The patient's vital signs were monitored continuously by radiology nursing throughout the procedure. Sedation Time: 30 minutes MEDICATIONS: 3.375 g Zosyn administered intravenously FLUOROSCOPY TIME:  11 minutes 42 seconds for a total of 170 mGy CONTRAST:  73m OMNIPAQUE IOHEXOL 300 MG/ML  SOLN TECHNIQUE: Informed consent was obtained from the patient following explanation of the procedure, risks, benefits and alternatives. The patient understands, agrees and consents for the procedure. All questions were addressed. A time out was performed. Maximal barrier sterile technique utilized including caps, mask, sterile gowns, sterile gloves, large sterile drape, hand hygiene, and Betadine skin prep. The existing right upper quadrant drainage catheter was gently hand injected with contrast material under real-time fluoroscopic guidance. The contrast material fills a collapsed cavity and then makes a clear fistulous  connection with the right posterior biliary tree. There is partial opacification of the dilated right biliary tree. Ultrasound was next used to interrogate the mid epigastric region. The left intrahepatic ducts are significantly dilated. A suitable peripheral segment 3 duct was successfully identified. Local anesthesia was attained by infiltration with 1% lidocaine. A small dermatotomy was made. Under real-time sonographic guidance, the bile duct was punctured with a 21 gauge micropuncture needle. There was return of bile to the needle hub. A gentle hand injection of contrast material confirmed needle placement within the biliary tree. A night tracks wire was successfully advanced into the dilated common hepatic duct. The Accustick sheath was then advanced over the wire and positioned in the dilated common hepatic duct. Transhepatic cholangiography was then performed. There is marked dilatation of the common hepatic duct. There is a high-grade stenosis bordering on occlusion at the juncture of the common hepatic and common bile ducts. The mid and distal common bile duct are patent in the ampulla is patent. A hydrophilic roadrunner wire was successfully navigated across the obstruction and into the duodenum. The Accustick sheath was then exchanged over the wire. The tract was dilated to 10 [place] drainage catheter was advanced over the wire and formed with the locking loop in the duodenum. The proximal side holes extend into the accessed segment 3 duct. Contrast injection through the tube confirms the tube location. The tube was secured to the skin with 0  Prolene suture and an adhesive fixation device. The tube was connected to gravity bag drainage. COMPLICATIONS: None Estimated blood loss:  0 IMPRESSION: 1. Contrast injection through the existing right upper quadrant abscess drain confirms a fistulous connection with the right posterior intrahepatic biliary ducts. Findings are consistent  with small biliary leak. 2. Percutaneous transhepatic cholangiography demonstrates severe intra and extrahepatic biliary ductal dilatation. There is high-grade stenosis bordering on occlusion at the juncture of the common hepatic and common bile ducts. The distal common bile duct and ampulla are patent. 3. Successful placement of an internal/external 10 [place] biliary drainage catheter. PLAN: 1. Maintain biliary drain to bag drainage until bilirubin has significantly trended down. Monitor electrolytes and replete as needed while in-house. The tube should be capped prior to discharge to prevent significant dehydration and electrolyte imbalance. 2. The right upper quadrant biloma drainage catheter should be monitored. When output has ceased, this tube can be removed. If the tube remains in place at the time of discharge patient should follow-up in interventional radiology drain clinic at 1 week following discharge. 3. Patient to undergo upper endoscopy and placement of a duodenal stent by gastroenterology at their . 4. Return to interventional radiology in 1 month for probable placement of an internal covered metal (Wall Flex) stent for continued palliative a decompression of the biliary tree. Signed, Criselda Peaches, MD [facility]iology Electronically Signed   By: Jacqulynn Cadet M.D.   On: 08/29/2015 16:07   Dg Abd Portable 1v  08/27/2015  CLINICAL DATA:  82 year old female status post nasogastric tube placement. EXAM: PORTABLE ABDOMEN - 1 VIEW COMPARISON:  Abdominal radiograph 08/22/2015. FINDINGS: Nasogastric tube in position with tip in the body of the stomach. Gas, oral contrast material and stool are seen scattered throughout the colon extending to the level of the distal rectum. No pathologic distension of small bowel is noted. No gross evidence of pneumoperitoneum. Surgical clips are noted in the anatomic pelvis. IMPRESSION: 1. Tip of nasogastric  tube is in the body of the stomach. Electronically Signed   By: Vinnie Langton M.D.   On: 08/27/2015 17:33   Dg C-arm 1-60 Min-no Report  08/28/2015  CLINICAL DATA: duodenal stricture C-ARM 1-60 MINUTES Fluoroscopy was utilized by the requesting physician.  No radiographic interpretation.   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 Additionally, she has a biliary leak. She requires percutaneous transhepatic cholangiogram with attempted placement of an internal/ external biliary drainage catheter both for biliary decompression and for bile diversion. EXAM: IR INT-EXT BILIARY DRAIN W/ CHOLANGIOGRAM; SINUS TRACT INJECTION/FISTULOGRAM Date: 08/29/2015 PROCEDURE: 1. Contrast injection through existing drainage catheter 2. Ultrasound-guided puncture of the left biliary tree 3. Placement of an internal/external biliary drainage catheter using fluoroscopic guidance Interventional Radiologist:  Criselda Peaches, MD ANESTHESIA/SEDATION: Moderate (conscious) sedation was used. 4 mg Versed, 150 mcg Fentanyl were administered intravenously. The patient's vital signs were monitored continuously by radiology nursing throughout the procedure. Sedation Time: 30 minutes MEDICATIONS: 3.375 g Zosyn administered intravenously FLUOROSCOPY TIME:  11 minutes 42 seconds for a total of 170 mGy CONTRAST:  42m OMNIPAQUE IOHEXOL 300 MG/ML  SOLN TECHNIQUE: Informed consent was obtained from the patient following explanation of the procedure, risks, benefits and alternatives. The patient understands, agrees and consents for the procedure. All questions were addressed. A time out was performed. Maximal barrier sterile technique utilized including caps, mask, sterile gowns, sterile  gloves, large sterile drape, hand hygiene, and Betadine skin prep. The existing right upper quadrant drainage catheter was gently hand injected with contrast  material under real-time fluoroscopic guidance. The contrast material fills a collapsed cavity and then makes a clear fistulous connection with the right posterior biliary tree. There is partial opacification of the dilated right biliary tree. Ultrasound was next used to interrogate the mid epigastric region. The left intrahepatic ducts are significantly dilated. A suitable peripheral segment 3 duct was successfully identified. Local anesthesia was attained by infiltration with 1% lidocaine. A small dermatotomy was made. Under real-time sonographic guidance, the bile duct was punctured with a 21 gauge micropuncture needle. There was return of bile to the needle hub. A gentle hand injection of contrast material confirmed needle placement within the biliary tree. A night tracks wire was successfully advanced into the dilated common hepatic duct. The Accustick sheath was then advanced over the wire and positioned in the dilated common hepatic duct. Transhepatic cholangiography was then performed. There is marked dilatation of the common hepatic duct. There is a high-grade stenosis bordering on occlusion at the juncture of the common hepatic and common bile ducts. The mid and distal common bile duct are patent in the ampulla is patent. A hydrophilic roadrunner wire was successfully navigated across the obstruction and into the duodenum. The Accustick sheath was then exchanged over the wire. The tract was dilated to 10 [place] and a [place] biliary drainage catheter was advanced over the wire and formed with the locking loop in the duodenum. The proximal side holes extend into the accessed segment 3 duct. Contrast injection through the tube confirms the tube location. The tube was secured to the skin with 0 Prolene suture and an adhesive fixation device. The tube was connected to gravity bag drainage. COMPLICATIONS: None Estimated blood loss:  0 IMPRESSION: 1. Contrast injection through the existing right upper  quadrant abscess drain confirms a fistulous connection with the right posterior intrahepatic biliary ducts. Findings are consistent with small biliary leak. 2. Percutaneous transhepatic cholangiography demonstrates severe intra and extrahepatic biliary ductal dilatation. There is high-grade stenosis bordering on occlusion at the juncture of the common hepatic and common bile ducts. The distal common bile duct and ampulla are patent. 3. Successful placement of an internal/external 10 [place] biliary drainage catheter. PLAN: 1. Maintain biliary drain to bag drainage until bilirubin has significantly trended down. Monitor electrolytes and replete as needed while in-house. The tube should be capped prior to discharge to prevent significant dehydration and electrolyte imbalance. 2. The right upper quadrant biloma drainage catheter should be monitored. When output has ceased, this tube can be removed. If the tube remains in place at the time of discharge patient should follow-up in interventional radiology drain clinic at 1 week following discharge. 3. Patient to undergo upper endoscopy and placement of a duodenal stent by gastroenterology at their . 4. Return to interventional radiology in 1 month for probable placement of an internal covered metal (Wall Flex) stent for continued palliative a decompression of the biliary tree. Signed, Criselda Peaches, MD [facility] Radiology Electronically Signed   By: Jacqulynn Cadet M.D.   On: 08/29/2015 16:07   Ct Image Guided Drainage By Percutaneous Catheter  08/28/2015  CLINICAL DATA:  Right Renal neoplasm. Liver lesion, post biopsy 08/10/2015. Postprocedure development of irregular fluid collection inferior to the caudate lobe of the liver. Abdominal pain. Gastric obstruction. EXAM: CT GUIDED DRAINAGE OF PERITONEAL ABSCESS ANESTHESIA/SEDATION: Clip  Intravenous Fentanyl and Versed were administered as conscious sedation  during continuous cardiorespiratory monitoring by the radiology RN, with a total moderate sedation time of 16 minutes. PROCEDURE: The procedure, risks, benefits, and alternatives were explained to the patient. Questions regarding the procedure were encouraged and answered. The patient understands and consents to the procedure. Select axial scans through the mid abdomen were obtained. The collection was localized and an appropriate skin entry site determined and marked. The operative field was prepped with Betadinein a sterile fashion, and a sterile drape was applied covering the operative field. A sterile gown and sterile gloves were used for the procedure. Local anesthesia was provided with 1% Lidocaine. Under intermittent CT guidance, an 18 gauge percutaneous entry needle was advanced into the collection. Bilious fluid could be aspirated. A guidewire advanced within the collection, its position confirmed on CT. Tract dilated to facilitate placement of a 12 French pigtail catheter within the collection. A sample of the aspirate was sent for Gram stain, culture and sensitivity. Catheter secured externally with 0 Prolene suture and StatLock and placed to gravity bag. The patient tolerated the procedure well. COMPLICATIONS: None immediate FINDINGS: Limited CT confirms peritoneal fluid collection inferior to the right lobe of the liver and gallbladder fossa. 12French pigtail drain placed under CT guidance. A sample of the Bilious aspirate was sent for Gram stain, culture and sensitivity. IMPRESSION: 1. Technically successful 12 French pigtail drain catheter into right upper quadrant peritoneal collection under CT guidance. Electronically Signed   By: Lucrezia Europe M.D.   On: 08/28/2015 13:46    Labs:  CBC:  Recent Labs  08/27/15 0540 08/28/15 0513 08/29/15 0530 08/30/15 0515  WBC 10.1 11.3* 9.2 9.8  HGB 12.7 12.9 13.3 12.1  HCT 38.7 39.9 39.6 37.7  PLT 342 353 340 344    COAGS:  Recent Labs   08/10/15 0715 08/20/15 0510 08/29/15 1050  INR 0.99 1.24 1.20  APTT  --  38*  --     BMP:  Recent Labs  08/27/15 0540 08/28/15 0513 08/29/15 0530 08/30/15 0515  NA 138 140 144 140  K 4.0 4.2 4.1 3.5  CL 98* 99* 102 99*  CO2 _0 GLUCOSE 122* 120* 121* 116*  BUN 19 19 29* 40*  CALCIUM 9.0 9.0 9.3 8.9  CREATININE 1.16* 1.17* 1.59* 1.58*  GFRNONAA 42* 42* 29* 29*  GFRAA 49* 48* 33* 34*    LIVER FUNCTION TESTS:  Recent Labs  08/27/15 0540 08/28/15 0513 08/29/15 0530 08/30/15 0515  BILITOT 1.7* 2.1* 0.8 1.2  AST 149* 241* 136* 62*  ALT 98* 199* 171* 110*  ALKPHOS 352* 412* 353* 254*  PROT 6.9 7.3 7.5 6.7  ALBUMIN 2.7* 2.8* 3.1* 2.7*    Assessment and Plan: Pt with hx met RCC with biliary obst/biloma, s/p RUQ biloma drainage 1/10, PTC with I/E biliary drain, RUQ drain injection 1/11; injection of RUQ drain shows fistula to rt post intrahepatic biliary ducts c/w bile leak; PTC shows severe intra and extrahepatic biliary ductal dilatation with high-grade stenosis bordering on occlusion at the juncture of the common hepatic and common bile ducts. The distal common bile duct and ampulla were patent; pt afebrile; LFT's trending down with t bili 1.2; WBC, hgb nl, creat 1.58. Bile cx's neg to date. Plans noted for duodenal stent on 1/13 per GI. IR PLANS AS BELOW - Maintain biliary drain to bag drainage until bilirubin has significantly trended down. Monitor electrolytes and replete as needed while in-house. The  tube should be capped prior to discharge to prevent significant dehydration and electrolyte imbalance.2. The right upper quadrant biloma drainage catheter should be monitored. When output has ceased, this tube can be removed. If the tube remains in place at the time of discharge patient should follow-up in interventional radiology drain clinic at 1 week following discharge.4. Return to interventional radiology in 1 month for probable placement of an internal covered  metal (Wall Flex) stent for continued palliative a decompression of the biliary tree.   Electronically Signed: D. Rowe Robert 08/30/2015, 3:37 PM   I spent a total of 15 minutes at the the patient's bedside AND on the patient's hospital floor or unit, greater than 50% of which was counseling/coordinating care for biliary /RUQ drains

## 2015-08-30 NOTE — Progress Notes (Signed)
[facility] Progress Note  Subjective:  External drain placed on 1/10 by IR into abd fluid collection, draining bilious fluid.  Then internal/external biliary drain placed with cholangiogram on 1/11 by IR as well. NGT remains in place. Patient with some right sided soreness.  Says that tylenol suppositories help.  Objective:  Vital signs in last 24 hours: Temp:  [97.4 F (36.3 C)-98.3 F (36.8 C)] 98.1 F (36.7 C) (01/12 0623) Pulse Rate:  [91-100] 91 (01/12 0623) Resp:  [20] 20 (01/12 0623) BP: (109-152)/(68-82) 133/82 mmHg (01/12 0623) SpO2:  [97 %-99 %] 98 % (01/12 0623) Last BM Date: 08/29/15 General:  Alert, Well-developed, in NAD; NGT in place. Heart:  Regular rate and rhythm; no murmurs Pulm:  CTAB.  No W/R/R. Abdomen:  Soft, non-distended.  BS present but quiet.  Right sided and epigastric TTP.  Drains noted with bilious drainage in bags. Extremities:  Without edema. Neurologic:  Alert and  oriented x4;  grossly normal neurologically. Psych:  Alert and cooperative. Normal mood and affect.  Intake/Output from previous day: 01/11 0701 - 01/12 0700 In: 875 [I.V.:825; IV Piggyback:50] Out: [id number] [Urine:245; Emesis/NG output:325; Drains:315]  Lab Results:  Recent Labs  08/28/15 0513 08/29/15 0530 08/30/15 0515  WBC 11.3* 9.2 9.8  HGB 12.9 13.3 12.1  HCT 39.9 39.6 37.7  PLT 353 340 344   BMET  Recent Labs  08/28/15 0513 08/29/15 0530 08/30/15 0515  NA 140 144 140  K 4.2 4.1 3.5  CL 99* 102 99*  CO2 29 27 27   GLUCOSE 120* 121* 116*  BUN 19 29* 40*  CREATININE 1.17* 1.59* 1.58*  CALCIUM 9.0 9.3 8.9   LFT  Recent Labs  08/30/15 0515  PROT 6.7  ALBUMIN 2.7*  AST 62*  ALT 110*  ALKPHOS 254*  BILITOT 1.2   PT/INR  Recent Labs  08/29/15 1050  LABPROT 15.4*  INR 1.20   Dg Abd 1 View  08/28/2015  CLINICAL DATA:  Biliary obstruction. EXAM: ABDOMEN - 1 VIEW COMPARISON:  CT 08/28/2015. FINDINGS: Endoscopic tube noted. No contrast  noted in the biliary system, by history patient has a duodenum stricture that did not allow cannulation of the biliary ductal system. NG tube noted . Right upper quadrant catheter noted. IMPRESSION: No contrast in the biliary system. By history patient has a duodenum stricture not allowing cannulation of the biliary system. 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 Additionally, she has a biliary leak. She requires percutaneous transhepatic cholangiogram with attempted placement of an internal/ external biliary drainage catheter both for biliary decompression and for bile diversion. EXAM: IR INT-EXT BILIARY DRAIN W/ CHOLANGIOGRAM; SINUS TRACT INJECTION/FISTULOGRAM Date: 08/29/2015 PROCEDURE: 1. Contrast injection through existing drainage catheter 2. Ultrasound-guided puncture of the left biliary tree 3. Placement of an internal/external biliary drainage catheter using fluoroscopic guidance Interventional Radiologist:  Criselda Peaches, MD ANESTHESIA/SEDATION: Moderate (conscious) sedation was used. 4 mg Versed, 150 mcg Fentanyl were administered intravenously. The patient's vital signs were monitored continuously by radiology nursing throughout the procedure. Sedation Time: 30 minutes MEDICATIONS: 3.375 g Zosyn administered intravenously FLUOROSCOPY TIME:  11 minutes 42 seconds for a total of 170 mGy CONTRAST:  48mL OMNIPAQUE IOHEXOL 300 MG/ML  SOLN TECHNIQUE: Informed consent was obtained from the patient following explanation of the procedure, risks, benefits and alternatives. The patient  understands, agrees and consents for the procedure. All questions were addressed. A time out was performed. Maximal barrier sterile technique utilized including caps, mask, sterile gowns, sterile gloves, large sterile drape, hand hygiene, and Betadine skin prep. The  existing right upper quadrant drainage catheter was gently hand injected with contrast material under real-time fluoroscopic guidance. The contrast material fills a collapsed cavity and then makes a clear fistulous connection with the right posterior biliary tree. There is partial opacification of the dilated right biliary tree. Ultrasound was next used to interrogate the mid epigastric region. The left intrahepatic ducts are significantly dilated. A suitable peripheral segment 3 duct was successfully identified. Local anesthesia was attained by infiltration with 1% lidocaine. A small dermatotomy was made. Under real-time sonographic guidance, the bile duct was punctured with a 21 gauge micropuncture needle. There was return of bile to the needle hub. A gentle hand injection of contrast material confirmed needle placement within the biliary tree. A night tracks wire was successfully advanced into the dilated common hepatic duct. The Accustick sheath was then advanced over the wire and positioned in the dilated common hepatic duct. Transhepatic cholangiography was then performed. There is marked dilatation of the common hepatic duct. There is a high-grade stenosis bordering on occlusion at the juncture of the common hepatic and common bile ducts. The mid and distal common bile duct are patent in the ampulla is patent. A hydrophilic roadrunner wire was successfully navigated across the obstruction and into the duodenum. The Accustick sheath was then exchanged over the wire. The tract was dilated to 10 [place] and a [place] biliary drainage catheter was advanced over the wire and formed with the locking loop in the duodenum. The proximal side holes extend into the accessed segment 3 duct. Contrast injection through the tube confirms the tube location. The tube was secured to the skin with 0 Prolene suture and an adhesive fixation device. The tube was connected to gravity bag drainage. COMPLICATIONS: None  Estimated blood loss:  0 IMPRESSION: 1. Contrast injection through the existing right upper quadrant abscess drain confirms a fistulous connection with the right posterior intrahepatic biliary ducts. Findings are consistent with small biliary leak. 2. Percutaneous transhepatic cholangiography demonstrates severe intra and extrahepatic biliary ductal dilatation. There is high-grade stenosis bordering on occlusion at the juncture of the common hepatic and common bile ducts. The distal common bile duct and ampulla are patent. 3. Successful placement of an internal/external 10 [place] biliary drainage catheter. PLAN: 1. Maintain biliary drain to bag drainage until bilirubin has significantly trended down. Monitor electrolytes and replete as needed while in-house. The tube should be capped prior to discharge to prevent significant dehydration and electrolyte imbalance. 2. The right upper quadrant biloma drainage catheter should be monitored. When output has ceased, this tube can be removed. If the tube remains in place at the time of discharge patient should follow-up in interventional radiology drain clinic at 1 week following discharge. 3. Patient to undergo upper endoscopy and placement of a duodenal stent by gastroenterology at their . 4. Return to interventional radiology in 1 month for probable placement of an internal covered metal (Wall Flex) stent for continued palliative a decompression of the biliary tree. Signed, Criselda Peaches, MD [facility] Radiology Electronically Signed   By: Jacqulynn Cadet M.D.   On: 08/29/2015 16:07   Dg C-arm 1-60 Min-no Report  08/28/2015  CLINICAL DATA: duodenal stricture C-ARM 1-60 MINUTES Fluoroscopy was utilized by the requesting  physician.  No radiographic interpretation.   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  Additionally, she has a biliary leak. She requires percutaneous transhepatic cholangiogram with attempted placement of an internal/ external biliary drainage catheter both for biliary decompression and for bile diversion. EXAM: IR INT-EXT BILIARY DRAIN W/ CHOLANGIOGRAM; SINUS TRACT INJECTION/FISTULOGRAM Date: 08/29/2015 PROCEDURE: 1. Contrast injection through existing drainage catheter 2. Ultrasound-guided puncture of the left biliary tree 3. Placement of an internal/external biliary drainage catheter using fluoroscopic guidance Interventional Radiologist:  Criselda Peaches, MD ANESTHESIA/SEDATION: Moderate (conscious) sedation was used. 4 mg Versed, 150 mcg Fentanyl were administered intravenously. The patient's vital signs were monitored continuously by radiology nursing throughout the procedure. Sedation Time: 30 minutes MEDICATIONS: 3.375 g Zosyn administered intravenously FLUOROSCOPY TIME:  11 minutes 42 seconds for a total of 170 mGy CONTRAST:  69mL OMNIPAQUE IOHEXOL 300 MG/ML  SOLN TECHNIQUE: Informed consent was obtained from the patient following explanation of the procedure, risks, benefits and alternatives. The patient understands, agrees and consents for the procedure. All questions were addressed. A time out was performed. Maximal barrier sterile technique utilized including caps, mask, sterile gowns, sterile gloves, large sterile drape, hand hygiene, and Betadine skin prep. The existing right upper quadrant drainage catheter was gently hand injected with contrast material under real-time fluoroscopic guidance. The contrast material fills a collapsed cavity and then makes a clear fistulous connection with the right posterior biliary tree. There is partial opacification of the dilated right biliary tree. Ultrasound was next used to interrogate the mid epigastric region. The left intrahepatic ducts are significantly dilated. A suitable peripheral segment 3 duct was successfully identified. Local  anesthesia was attained by infiltration with 1% lidocaine. A small dermatotomy was made. Under real-time sonographic guidance, the bile duct was punctured with a 21 gauge micropuncture needle. There was return of bile to the needle hub. A gentle hand injection of contrast material confirmed needle placement within the biliary tree. A night tracks wire was successfully advanced into the dilated common hepatic duct. The Accustick sheath was then advanced over the wire and positioned in the dilated common hepatic duct. Transhepatic cholangiography was then performed. There is marked dilatation of the common hepatic duct. There is a high-grade stenosis bordering on occlusion at the juncture of the common hepatic and common bile ducts. The mid and distal common bile duct are patent in the ampulla is patent. A hydrophilic roadrunner wire was successfully navigated across the obstruction and into the duodenum. The Accustick sheath was then exchanged over the wire. The tract was dilated to 10 [place] and a [place] biliary drainage catheter was advanced over the wire and formed with the locking loop in the duodenum. The proximal side holes extend into the accessed segment 3 duct. Contrast injection through the tube confirms the tube location. The tube was secured to the skin with 0 Prolene suture and an adhesive fixation device. The tube was connected to gravity bag drainage. COMPLICATIONS: None Estimated blood loss:  0 IMPRESSION: 1. Contrast injection through the existing right upper quadrant abscess drain confirms a fistulous connection with the right posterior intrahepatic biliary ducts. Findings are consistent with small biliary leak. 2. Percutaneous transhepatic cholangiography demonstrates severe intra and extrahepatic biliary ductal dilatation. There is high-grade stenosis bordering on occlusion at the juncture of the common hepatic and common bile ducts. The distal common bile duct and ampulla are patent. 3.  Successful  placement of an internal/external 10 [place] biliary drainage catheter. PLAN: 1. Maintain biliary drain to bag drainage until bilirubin has significantly trended down. Monitor electrolytes and replete as needed while in-house. The tube should be capped prior to discharge to prevent significant dehydration and electrolyte imbalance. 2. The right upper quadrant biloma drainage catheter should be monitored. When output has ceased, this tube can be removed. If the tube remains in place at the time of discharge patient should follow-up in interventional radiology drain clinic at 1 week following discharge. 3. Patient to undergo upper endoscopy and placement of a duodenal stent by gastroenterology at their . 4. Return to interventional radiology in 1 month for probable placement of an internal covered metal (Wall Flex) stent for continued palliative a decompression of the biliary tree. Signed, Criselda Peaches, MD [facility] Radiology Electronically Signed   By: Jacqulynn Cadet M.D.   On: 08/29/2015 16:07   Ct Image Guided Drainage By Percutaneous Catheter  08/28/2015  CLINICAL DATA:  Right Renal neoplasm. Liver lesion, post biopsy 08/10/2015. Postprocedure development of irregular fluid collection inferior to the caudate lobe of the liver. Abdominal pain. Gastric obstruction. EXAM: CT GUIDED DRAINAGE OF PERITONEAL ABSCESS ANESTHESIA/SEDATION: Clip Intravenous Fentanyl and Versed were administered as conscious sedation during continuous cardiorespiratory monitoring by the radiology RN, with a total moderate sedation time of 16 minutes. PROCEDURE: The procedure, risks, benefits, and alternatives were explained to the patient. Questions regarding the procedure were encouraged and answered. The patient understands and consents to the procedure. Select axial scans through the mid abdomen were obtained. The collection was localized and an appropriate skin  entry site determined and marked. The operative field was prepped with Betadinein a sterile fashion, and a sterile drape was applied covering the operative field. A sterile gown and sterile gloves were used for the procedure. Local anesthesia was provided with 1% Lidocaine. Under intermittent CT guidance, an 18 gauge percutaneous entry needle was advanced into the collection. Bilious fluid could be aspirated. A guidewire advanced within the collection, its position confirmed on CT. Tract dilated to facilitate placement of a 12 French pigtail catheter within the collection. A sample of the aspirate was sent for Gram stain, culture and sensitivity. Catheter secured externally with 0 Prolene suture and StatLock and placed to gravity bag. The patient tolerated the procedure well. COMPLICATIONS: None immediate FINDINGS: Limited CT confirms peritoneal fluid collection inferior to the right lobe of the liver and gallbladder fossa. 12French pigtail drain placed under CT guidance. A sample of the Bilious aspirate was sent for Gram stain, culture and sensitivity. IMPRESSION: 1. Technically successful 12 French pigtail drain catheter into right upper quadrant peritoneal collection under CT guidance. Electronically Signed   By: Lucrezia Europe M.D.   On: 08/28/2015 13:46   Assessment / Plan: *82 yo with metastatic urothelial cancer admitted with nausea, vomiting, and fever found to have biliary obstruction and duodenal obstruction  1. Biliary obstruction/intra-abdominal fluid collection -- right middle/lower quadrant fluid collection drained with external drain by IR. Fluid bilious in nature with improvement in liver enzymes and normalization of bilirubin since external drain placed. This study was reviewed with Dr. Laurence Ferrari -- very likely that she had rupture of biliary tree secondary to malignancy with formation of bilious intra-abdominal fluid collection. This is now draining externally resulting in improving liver  enzymes. --Patient underwent internal/external biliary drain placement with cholangiogram by IR on 1/11.  LFT's continuing to trend down.  2. Duodenal obstruction -- external  compression causing significant luminal narrowing at D1 /D2 junction. Endoluminal stenting is an option: we discussed risks which would be stent migration, pain, bleeding and perforation. EGD with duodenal stent placement planned for 1/13 at 11 AM.  Patient and family understand that endoluminal stenting is palliative.   3. Metastatic urothelial carcinoma -- oncology treatment decisions per Dr. Benay Spice    LOS: 10 days   ZEHR, JESSICA D.  08/30/2015, 12:13 PM  Pager number [phone]

## 2015-08-30 NOTE — Progress Notes (Signed)
PROGRESS NOTE  Kim Donovan XHB:[id number] DOB: 11-02-1928 DOA: 08/19/2015 PCP: Mathews Argyle, MD   HPI: 82 yo F with recently diagnosed metastatic renal cell carcinoma (preliminary pathology), additional immunohistochemical histochemical stains most consistent with a poorly differentiated urothelial carcinoma, admitted on 1/2 with nausea and vomiting, found to have biliary and duodenal obstruction.   Subjective / 24 H Interval events - endorses a sore throat, overall feeling well - endorses weakness - wants a break today from any procedures  Assessment/Plan: Principal Problem:   Sepsis ([place]) Active Problems:   GLAUCOMA   Polymyalgia rheumatica (HCC)   S/P AVR (aortic valve replacement)   Chronic diastolic CHF (congestive heart failure) (HCC)   Renal mass, right   Liver masses   AKI (acute kidney injury) (HCC)   Nausea and vomiting   Hyponatremia   Essential hypertension   Intraabdominal fluid collection   Hepatic metastases (HCC)   CAP (community acquired pneumonia)   Elevated lipase   Transaminitis   Fever, unspecified   Abdominal fluid collection   Metastatic renal cell carcinoma (HCC)   Elevated LFTs   Acquired dilation of bile duct   Gastric outlet obstruction   Abnormal CT scan, stomach   Biliary obstruction   Bile duct rupture   Biliary obstruction due to malignant neoplasm   Sepsis due to probable aspiration pneumonia - CT scan 1/6 with left lower lobe infiltrate - on Zosyn, started on 1/2, continue until biliary cultures remain negative  Stage IV urothelial carcinoma, with liver mets - Dr. Benay Spice following, appreciate input. Discussed with him on the floor   Biliary obstruction - CT scan on 1/6 with biliary dilatation, "Stable intrahepatic biliary dilatation with an abrupt cut off of the common bile duct" - elevated Alk phos, LFTs and bilirubin.  - now with 2 drains, one in the RUQ peritoneal fluid collection, also s/p PTC and placement of a  61F internal/external biliary drain on 1/11 by IR. There appears to be a fistulous connection between the fluid collection and the right posterior intrahepatic biliary ducts. - monitor drain outputs, remove RUQ drain once it stops draining and cap PTC drain prior to d/c  Duodenal obstruction  - plan for stent tomorrow, discussed with GI  RUQ peritoneal fluid collection - s/p drain placement per IR on 1/10, draining bilious fluid - cultures without growth so far, pending  AKI on CKD III - suspect pre-renal on top of CKD, it appears that her GFR in 2016 was ~40, prior to that in 2012 within normal limits - IVF, repeat renal function in am   Nausea/vomiting - NG tube placement 1/9, continue as long as she has duodenal obstruction - continue supportive treatment  HTN - stable  PMR  S/p bioprosthetic AVR  Chronic diastolic CHF - watch fluid status carefully, 2D echo 04/2014 with normal EF 81-01%, grade 1 diastolic dysfunction   Diet: Diet NPO time specified Except for: [employer], Sips with Meds Fluids: D5 1/2 NS DVT Prophylaxis: Lovenox  Code Status: DNR Family Communication: d/w husband bedside Disposition Plan: home when ready   Barriers to discharge: duodenal stent plan for tomorrow  Consultants:  IR  GI  Oncology   Procedures:  EGD 1/10 ENDOSCOPIC IMPRESSION: 1. Severe stricture in the 2nd part of the duodenum, ERCP not attempted 2. Large volume of food residue in the gastric fundus and gastric body 3. Stricture at the gastroesophageal junction   Antibiotics Vancomycin 08/20/15>08/23/15 Zosyn 08/20/15>08/24/15, 08/27/15>> Zithromax 08/22/15>08/24/15 Levaquin 08/24/15> 08/26/2015  Studies  Dg Abd 1 View  08/28/2015  CLINICAL DATA:  Biliary obstruction. EXAM: ABDOMEN - 1 VIEW COMPARISON:  CT 08/28/2015. FINDINGS: Endoscopic tube noted. No contrast noted in the biliary system, by history patient has a duodenum stricture that did not allow cannulation of the  biliary ductal system. NG tube noted . Right upper quadrant catheter noted. IMPRESSION: No contrast in the biliary system. By history patient has a duodenum stricture not allowing cannulation of the biliary system. 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 Additionally, she has a biliary leak. She requires percutaneous transhepatic cholangiogram with attempted placement of an internal/ external biliary drainage catheter both for biliary decompression and for bile diversion. EXAM: IR INT-EXT BILIARY DRAIN W/ CHOLANGIOGRAM; SINUS TRACT INJECTION/FISTULOGRAM Date: 08/29/2015 PROCEDURE: 1. Contrast injection through existing drainage catheter 2. Ultrasound-guided puncture of the left biliary tree 3. Placement of an internal/external biliary drainage catheter using fluoroscopic guidance Interventional Radiologist:  Criselda Peaches, MD ANESTHESIA/SEDATION: Moderate (conscious) sedation was used. 4 mg Versed, 150 mcg Fentanyl were administered intravenously. The patient's vital signs were monitored continuously by radiology nursing throughout the procedure. Sedation Time: 30 minutes MEDICATIONS: 3.375 g Zosyn administered intravenously FLUOROSCOPY TIME:  11 minutes 42 seconds for a total of 170 mGy CONTRAST:  103m OMNIPAQUE IOHEXOL 300 MG/ML  SOLN TECHNIQUE: Informed consent was obtained from the patient following explanation of the procedure, risks, benefits and alternatives. The patient understands, agrees and consents for the procedure. All questions were addressed. A time out was performed. Maximal barrier sterile technique utilized including caps, mask, sterile gowns, sterile gloves, large sterile drape, hand hygiene, and Betadine skin prep. The existing right upper quadrant drainage catheter was gently hand injected with contrast material under real-time  fluoroscopic guidance. The contrast material fills a collapsed cavity and then makes a clear fistulous connection with the right posterior biliary tree. There is partial opacification of the dilated right biliary tree. Ultrasound was next used to interrogate the mid epigastric region. The left intrahepatic ducts are significantly dilated. A suitable peripheral segment 3 duct was successfully identified. Local anesthesia was attained by infiltration with 1% lidocaine. A small dermatotomy was made. Under real-time sonographic guidance, the bile duct was punctured with a 21 gauge micropuncture needle. There was return of bile to the needle hub. A gentle hand injection of contrast material confirmed needle placement within the biliary tree. A night tracks wire was successfully advanced into the dilated common hepatic duct. The Accustick sheath was then advanced over the wire and positioned in the dilated common hepatic duct. Transhepatic cholangiography was then performed. There is marked dilatation of the common hepatic duct. There is a high-grade stenosis bordering on occlusion at the juncture of the common hepatic and common bile ducts. The mid and distal common bile duct are patent in the ampulla is patent. A hydrophilic roadrunner wire was successfully navigated across the obstruction and into the duodenum. The Accustick sheath was then exchanged over the wire. The tract was dilated to 10 [place] drainage catheter was advanced over the wire and formed with the locking loop in the duodenum. The proximal side holes extend into the accessed segment 3 duct. Contrast injection through the tube confirms the tube location. The tube was secured to the skin with 0 Prolene suture and an adhesive fixation device.  The tube was connected to gravity bag drainage. COMPLICATIONS: None Estimated blood loss:  0 IMPRESSION: 1. Contrast injection through the existing right upper quadrant abscess drain  confirms a fistulous connection with the right posterior intrahepatic biliary ducts. Findings are consistent with small biliary leak. 2. Percutaneous transhepatic cholangiography demonstrates severe intra and extrahepatic biliary ductal dilatation. There is high-grade stenosis bordering on occlusion at the juncture of the common hepatic and common bile ducts. The distal common bile duct and ampulla are patent. 3. Successful placement of an internal/external 10 [place] biliary drainage catheter. PLAN: 1. Maintain biliary drain to bag drainage until bilirubin has significantly trended down. Monitor electrolytes and replete as needed while in-house. The tube should be capped prior to discharge to prevent significant dehydration and electrolyte imbalance. 2. The right upper quadrant biloma drainage catheter should be monitored. When output has ceased, this tube can be removed. If the tube remains in place at the time of discharge patient should follow-up in interventional radiology drain clinic at 1 week following discharge. 3. Patient to undergo upper endoscopy and placement of a duodenal stent by gastroenterology at their . 4. Return to interventional radiology in 1 month for probable placement of an internal covered metal (Wall Flex) stent for continued palliative a decompression of the biliary tree. Signed, Criselda Peaches, MD [facility] Radiology Electronically Signed   By: Jacqulynn Cadet M.D.   On: 08/29/2015 16:07   Dg C-arm 1-60 Min-no Report  08/28/2015  CLINICAL DATA: duodenal stricture C-ARM 1-60 MINUTES Fluoroscopy was utilized by the requesting physician.  No radiographic interpretation.   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 Additionally, she has a biliary leak. She requires percutaneous transhepatic cholangiogram with attempted placement  of an internal/ external biliary drainage catheter both for biliary decompression and for bile diversion. EXAM: IR INT-EXT BILIARY DRAIN W/ CHOLANGIOGRAM; SINUS TRACT INJECTION/FISTULOGRAM Date: 08/29/2015 PROCEDURE: 1. Contrast injection through existing drainage catheter 2. Ultrasound-guided puncture of the left biliary tree 3. Placement of an internal/external biliary drainage catheter using fluoroscopic guidance Interventional Radiologist:  Criselda Peaches, MD ANESTHESIA/SEDATION: Moderate (conscious) sedation was used. 4 mg Versed, 150 mcg Fentanyl were administered intravenously. The patient's vital signs were monitored continuously by radiology nursing throughout the procedure. Sedation Time: 30 minutes MEDICATIONS: 3.375 g Zosyn administered intravenously FLUOROSCOPY TIME:  11 minutes 42 seconds for a total of 170 mGy CONTRAST:  49m OMNIPAQUE IOHEXOL 300 MG/ML  SOLN TECHNIQUE: Informed consent was obtained from the patient following explanation of the procedure, risks, benefits and alternatives. The patient understands, agrees and consents for the procedure. All questions were addressed. A time out was performed. Maximal barrier sterile technique utilized including caps, mask, sterile gowns, sterile gloves, large sterile drape, hand hygiene, and Betadine skin prep. The existing right upper quadrant drainage catheter was gently hand injected with contrast material under real-time fluoroscopic guidance. The contrast material fills a collapsed cavity and then makes a clear fistulous connection with the right posterior biliary tree. There is partial opacification of the dilated right biliary tree. Ultrasound was next used to interrogate the mid epigastric region. The left intrahepatic ducts are significantly dilated. A suitable peripheral segment 3 duct was successfully identified. Local anesthesia was attained by infiltration with 1% lidocaine. A small dermatotomy was made. Under real-time sonographic  guidance, the bile duct was punctured with a 21 gauge micropuncture needle. There was return of  bile to the needle hub. A gentle hand injection of contrast material confirmed needle placement within the biliary tree. A night tracks wire was successfully advanced into the dilated common hepatic duct. The Accustick sheath was then advanced over the wire and positioned in the dilated common hepatic duct. Transhepatic cholangiography was then performed. There is marked dilatation of the common hepatic duct. There is a high-grade stenosis bordering on occlusion at the juncture of the common hepatic and common bile ducts. The mid and distal common bile duct are patent in the ampulla is patent. A hydrophilic roadrunner wire was successfully navigated across the obstruction and into the duodenum. The Accustick sheath was then exchanged over the wire. The tract was dilated to 10 [place] and a [place] biliary drainage catheter was advanced over the wire and formed with the locking loop in the duodenum. The proximal side holes extend into the accessed segment 3 duct. Contrast injection through the tube confirms the tube location. The tube was secured to the skin with 0 Prolene suture and an adhesive fixation device. The tube was connected to gravity bag drainage. COMPLICATIONS: None Estimated blood loss:  0 IMPRESSION: 1. Contrast injection through the existing right upper quadrant abscess drain confirms a fistulous connection with the right posterior intrahepatic biliary ducts. Findings are consistent with small biliary leak. 2. Percutaneous transhepatic cholangiography demonstrates severe intra and extrahepatic biliary ductal dilatation. There is high-grade stenosis bordering on occlusion at the juncture of the common hepatic and common bile ducts. The distal common bile duct and ampulla are patent. 3. Successful placement of an internal/external 10 [place] biliary drainage catheter. PLAN: 1. Maintain biliary drain to  bag drainage until bilirubin has significantly trended down. Monitor electrolytes and replete as needed while in-house. The tube should be capped prior to discharge to prevent significant dehydration and electrolyte imbalance. 2. The right upper quadrant biloma drainage catheter should be monitored. When output has ceased, this tube can be removed. If the tube remains in place at the time of discharge patient should follow-up in interventional radiology drain clinic at 1 week following discharge. 3. Patient to undergo upper endoscopy and placement of a duodenal stent by gastroenterology at their . 4. Return to interventional radiology in 1 month for probable placement of an internal covered metal (Wall Flex) stent for continued palliative a decompression of the biliary tree. Signed, Criselda Peaches, MD [facility] Radiology Electronically Signed   By: Jacqulynn Cadet M.D.   On: 08/29/2015 16:07   Ct Image Guided Drainage By Percutaneous Catheter  08/28/2015  CLINICAL DATA:  Right Renal neoplasm. Liver lesion, post biopsy 08/10/2015. Postprocedure development of irregular fluid collection inferior to the caudate lobe of the liver. Abdominal pain. Gastric obstruction. EXAM: CT GUIDED DRAINAGE OF PERITONEAL ABSCESS ANESTHESIA/SEDATION: Clip Intravenous Fentanyl and Versed were administered as conscious sedation during continuous cardiorespiratory monitoring by the radiology RN, with a total moderate sedation time of 16 minutes. PROCEDURE: The procedure, risks, benefits, and alternatives were explained to the patient. Questions regarding the procedure were encouraged and answered. The patient understands and consents to the procedure. Select axial scans through the mid abdomen were obtained. The collection was localized and an appropriate skin entry site determined and marked. The operative field was prepped with Betadinein a sterile fashion, and a sterile drape  was applied covering the operative field. A sterile gown and sterile gloves were used for the procedure. Local anesthesia was provided with 1% Lidocaine. Under intermittent CT  guidance, an 18 gauge percutaneous entry needle was advanced into the collection. Bilious fluid could be aspirated. A guidewire advanced within the collection, its position confirmed on CT. Tract dilated to facilitate placement of a 12 French pigtail catheter within the collection. A sample of the aspirate was sent for Gram stain, culture and sensitivity. Catheter secured externally with 0 Prolene suture and StatLock and placed to gravity bag. The patient tolerated the procedure well. COMPLICATIONS: None immediate FINDINGS: Limited CT confirms peritoneal fluid collection inferior to the right lobe of the liver and gallbladder fossa. 12French pigtail drain placed under CT guidance. A sample of the Bilious aspirate was sent for Gram stain, culture and sensitivity. IMPRESSION: 1. Technically successful 12 French pigtail drain catheter into right upper quadrant peritoneal collection under CT guidance. Electronically Signed   By: Lucrezia Europe M.D.   On: 08/28/2015 13:46    Objective  Filed Vitals:   08/29/15 1212 08/29/15 1332 08/29/15 2223 08/30/15 0623  BP: 153/101 152/82 109/68 133/82  Pulse: 104 100 94 91  Temp:  98.3 F (36.8 C) 97.4 F (36.3 C) 98.1 F (36.7 C)  TempSrc:  Oral Oral Oral  Resp: '22 20 20 20  ' Height:      Weight:      SpO2: 98% 99% 97% 98%    Intake/Output Summary (Last 24 hours) at 08/30/15 1254 Last data filed at 08/30/15 0900  Gross per 24 hour  Intake    875 ml  Output    885 ml  Net    -10 ml   Filed Weights   08/20/15 0431  Weight: 61.7 kg (136 lb 0.4 oz)    Exam:  GENERAL: NAD, pleasant, NG tube in place  HEENT: no scleral icterus, PERRL  NECK: supple  LUNGS: CTA biL, no wheezing  HEART: RRR without MRG  ABDOMEN: soft, non tender. 2 drains present, bag full on the PTC  drain  EXTREMITIES: no clubbing / cyanosis  NEUROLOGIC: non focal  PSYCHIATRIC: normal mood and affect   Data Reviewed: Basic Metabolic Panel:  Recent Labs Lab 08/26/15 0405 08/27/15 0540 08/28/15 0513 08/29/15 0530 08/30/15 0515  NA 138 138 140 144 140  K 4.0 4.0 4.2 4.1 3.5  CL 100* 98* 99* 102 99*  CO2 '29 31 29 27 27  ' GLUCOSE 101* 122* 120* 121* 116*  BUN '17 19 19 ' 29* 40*  CREATININE 1.09* 1.16* 1.17* 1.59* 1.58*  CALCIUM 8.8* 9.0 9.0 9.3 8.9   Liver Function Tests:  Recent Labs Lab 08/25/15 0430 08/27/15 0540 08/28/15 0513 08/29/15 0530 08/30/15 0515  AST 56* 149* 241* 136* 62*  ALT 42 98* 199* 171* 110*  ALKPHOS 196* 352* 412* 353* 254*  BILITOT 1.2 1.7* 2.1* 0.8 1.2  PROT 6.8 6.9 7.3 7.5 6.7  ALBUMIN 2.7* 2.7* 2.8* 3.1* 2.7*    Recent Labs Lab 08/26/15 0405 08/27/15 0540 08/28/15 0513  LIPASE 61* 49 46  AMYLASE 113* 112* 99   CBC:  Recent Labs Lab 08/24/15 0440  08/26/15 0405 08/27/15 0540 08/28/15 0513 08/29/15 0530 08/30/15 0515  WBC 6.2  < > 7.7 10.1 11.3* 9.2 9.8  NEUTROABS 3.9  --   --  7.5 8.2* 7.8*  --   HGB 11.8*  < > 12.1 12.7 12.9 13.3 12.1  HCT 35.5*  < > 36.6 38.7 39.9 39.6 37.7  MCV 89.6  < > 89.9 90.2 91.3 90.0 92.2  PLT 302  < > 331 342 353 340 344  < > = values  in this interval not displayed. BNP (last 3 results)  Recent Labs  05/15/15 1115 06/15/15 0955  BNP 111.4* 100.1*   Recent Results (from the past 240 hour(s))  Culture, blood (routine x 2)     Status: None (Preliminary result)   Collection Time: 08/27/15 12:55 PM  Result Value Ref Range Status   Specimen Description BLOOD LEFT ARM  Final   Special Requests BOTTLES DRAWN AEROBIC AND ANAEROBIC 10CC  Final   Culture   Final    NO GROWTH 3 DAYS Performed at [facility]    Report Status PENDING  Incomplete  Culture, blood (routine x 2)     Status: None (Preliminary result)   Collection Time: 08/27/15  1:10 PM  Result Value Ref Range Status    Specimen Description BLOOD RIGHT HAND  Final   Special Requests IN PEDIATRIC BOTTLE [place]  Final   Culture   Final    NO GROWTH 3 DAYS Performed at [facility]    Report Status PENDING  Incomplete  Culture, Urine     Status: None   Collection Time: 08/28/15 12:25 AM  Result Value Ref Range Status   Specimen Description URINE, RANDOM  Final   Special Requests NONE  Final   Culture   Final    20,000 COLONIES/mL YEAST Performed at [facility]    Report Status 08/29/2015 FINAL  Final  Culture, routine-abscess     Status: None (Preliminary result)   Collection Time: 08/28/15 12:58 PM  Result Value Ref Range Status   Specimen Description DRAINAGE RUQ CT DRAIN  Final   Special Requests NONE  Final   Gram Stain   Final    NO WBC SEEN NO SQUAMOUS EPITHELIAL CELLS SEEN NO ORGANISMS SEEN Performed at [facility]    Culture   Final    NO GROWTH 2 DAYS Performed at [facility]    Report Status PENDING  Incomplete     Scheduled Meds: . antiseptic oral rinse  7 mL Mouth Rinse BID  . dorzolamide-timolol  1 drop Left Eye BID  . enoxaparin (LOVENOX) injection  30 mg Subcutaneous Q24H  . indomethacin  100 mg Rectal Once  . lactose free nutrition  237 mL Oral TID WC  . pantoprazole (PROTONIX) IV  40 mg Intravenous Q24H  . piperacillin-tazobactam  3.375 g Intravenous Q8H  . sodium chloride  1,000 mL Intravenous Once  . sodium chloride  3 mL Intravenous Q12H   Continuous Infusions: . dextrose 5 % and 0.45% NaCl 75 mL/hr at 08/30/15 0238    Marzetta Board, MD [facility] Pager [phone]. If 7 PM - 7 AM, please contact night-coverage at [email or web address], password [facility] 08/30/2015, 12:54 PM  LOS: 10 days

## 2015-08-30 NOTE — Progress Notes (Signed)
Physical Therapy Treatment Patient Details Name: Kim Donovan MRN: [id number] DOB: 1929-03-27 Today's Date: 08/30/2015    History of Present Illness Kim Donovan is a 82 y.o. female with a history of HTN. PMR, Hyperlipidemia, and Glaucoma, with recent biopsy of a Large Right Renal Mass and a Hepatic lesion on 08/10/2015 who presents to the ED with complaints of Nausea and Vomiting and Fever     PT Comments    Progressing with mobility.   Follow Up Recommendations  SNF     Equipment Recommendations  Rolling walker with 5" wheels    Recommendations for Other Services       Precautions / Restrictions Precautions Precautions: Fall Restrictions Weight Bearing Restrictions: No    Mobility  Bed Mobility               General bed mobility comments: oob in recliner  Transfers Overall transfer level: Needs assistance Equipment used: Rolling walker (2 wheeled) Transfers: Sit to/from Stand Sit to Stand: Min assist         General transfer comment: assist to rise, stabilize, control descent.   Ambulation/Gait Ambulation/Gait assistance: Min assist Ambulation Distance (Feet): 350 Feet Assistive device: Rolling walker (2 wheeled) Gait Pattern/deviations: Step-through pattern;Decreased stride length     General Gait Details: assist to stabilize intermittently. Pt tolerated activity fairly well. Noted fatigue after ~200 or so fatigue but pt wished to continue.    Stairs            Wheelchair Mobility    Modified Rankin (Stroke Patients Only)       Balance                                    Cognition Arousal/Alertness: Awake/alert Behavior During Therapy: WFL for tasks assessed/performed Overall Cognitive Status: Within Functional Limits for tasks assessed                      Exercises      General Comments        Pertinent Vitals/Pain Pain Assessment: No/denies pain    Home Living                       Prior Function            PT Goals (current goals can now be found in the care plan section) Progress towards PT goals: Progressing toward goals    Frequency  Min 3X/week    PT Plan Current plan remains appropriate    Co-evaluation             End of Session   Activity Tolerance: Patient tolerated treatment well Patient left: in chair;with call bell/phone within reach;with chair alarm set     Time: 1422-1443 PT Time Calculation (min) (ACUTE ONLY): 21 min  Charges:  $Gait Training: 8-22 mins                    G Codes:      Weston Anna, MPT Pager: [phone]

## 2015-08-31 ENCOUNTER — Inpatient Hospital Stay (HOSPITAL_COMMUNITY): Payer: Medicare Other

## 2015-08-31 ENCOUNTER — Inpatient Hospital Stay (HOSPITAL_COMMUNITY): Payer: Medicare Other | Admitting: Anesthesiology

## 2015-08-31 ENCOUNTER — Other Ambulatory Visit: Payer: Medicare Other

## 2015-08-31 ENCOUNTER — Encounter (HOSPITAL_COMMUNITY): Payer: Self-pay | Admitting: Internal Medicine

## 2015-08-31 ENCOUNTER — Encounter (HOSPITAL_COMMUNITY)
Admission: EM | Disposition: A | Discharge status: Skilled Nursing Facility | Payer: Self-pay | Source: Home / Self Care | Attending: Internal Medicine

## 2015-08-31 ENCOUNTER — Ambulatory Visit: Payer: Medicare Other | Admitting: Nurse Practitioner

## 2015-08-31 DIAGNOSIS — K315 Obstruction of duodenum: Secondary | ICD-10-CM | POA: Insufficient documentation

## 2015-08-31 DIAGNOSIS — A419 Sepsis, unspecified organism: Principal | ICD-10-CM

## 2015-08-31 HISTORY — PX: ESOPHAGOGASTRODUODENOSCOPY: SHX5428

## 2015-08-31 LAB — HEPATIC FUNCTION PANEL
ALBUMIN: 2.7 g/dL — AB (ref 3.5–5.0)
ALT: 69 U/L — ABNORMAL HIGH (ref 14–54)
AST: 39 U/L (ref 15–41)
Alkaline Phosphatase: 200 U/L — ABNORMAL HIGH (ref 38–126)
BILIRUBIN TOTAL: 1.2 mg/dL (ref 0.3–1.2)
Bilirubin, Direct: 0.3 mg/dL (ref 0.1–0.5)
Indirect Bilirubin: 0.9 mg/dL (ref 0.3–0.9)
Total Protein: 6.5 g/dL (ref 6.5–8.1)

## 2015-08-31 LAB — CBC
HEMATOCRIT: 35.7 % — AB (ref 36.0–46.0)
HEMOGLOBIN: 11.6 g/dL — AB (ref 12.0–15.0)
MCH: 29.8 pg (ref 26.0–34.0)
MCHC: 32.5 g/dL (ref 30.0–36.0)
MCV: 91.8 fL (ref 78.0–100.0)
Platelets: 268 10*3/uL (ref 150–400)
RBC: 3.89 MIL/uL (ref 3.87–5.11)
RDW: 14.6 % (ref 11.5–15.5)
WBC: 9.8 10*3/uL (ref 4.0–10.5)

## 2015-08-31 LAB — BASIC METABOLIC PANEL
ANION GAP: 15 (ref 5–15)
BUN: 27 mg/dL — AB (ref 6–20)
CALCIUM: 8.4 mg/dL — AB (ref 8.9–10.3)
CHLORIDE: 103 mmol/L (ref 101–111)
CO2: 22 mmol/L (ref 22–32)
CREATININE: 1.03 mg/dL — AB (ref 0.44–1.00)
GFR calc Af Amer: 56 mL/min — ABNORMAL LOW (ref >60)
GFR calc non Af Amer: 49 mL/min — ABNORMAL LOW (ref >60)
GLUCOSE: 73 mg/dL (ref 65–99)
POTASSIUM: 3.4 mmol/L — AB (ref 3.5–5.1)
Sodium: 140 mmol/L (ref 135–145)

## 2015-08-31 LAB — CULTURE, ROUTINE-ABSCESS
Culture: NO GROWTH
Gram Stain: NONE SEEN

## 2015-08-31 SURGERY — EGD (ESOPHAGOGASTRODUODENOSCOPY)
Anesthesia: General

## 2015-08-31 MED ORDER — ONDANSETRON HCL 4 MG/2ML IJ SOLN
INTRAMUSCULAR | Status: DC | PRN
  Administered 2015-08-31: 4 mg via INTRAVENOUS

## 2015-08-31 MED ORDER — ETOMIDATE 2 MG/ML IV SOLN
INTRAVENOUS | Status: AC
  Filled 2015-08-31: qty 10

## 2015-08-31 MED ORDER — LIDOCAINE HCL (PF) 2 % IJ SOLN
INTRAMUSCULAR | Status: DC | PRN
  Administered 2015-08-31: 75 mg via INTRADERMAL

## 2015-08-31 MED ORDER — SUCCINYLCHOLINE CHLORIDE 20 MG/ML IJ SOLN
INTRAMUSCULAR | Status: DC | PRN
  Administered 2015-08-31: 100 mg via INTRAVENOUS

## 2015-08-31 MED ORDER — ENOXAPARIN SODIUM 40 MG/0.4ML ~~LOC~~ SOLN
40.0000 mg | SUBCUTANEOUS | Status: DC
  Administered 2015-08-31 – 2015-09-05 (×6): 40 mg via SUBCUTANEOUS
  Filled 2015-08-31 (×6): qty 0.4

## 2015-08-31 MED ORDER — FENTANYL CITRATE (PF) 100 MCG/2ML IJ SOLN
INTRAMUSCULAR | Status: AC
  Filled 2015-08-31: qty 2

## 2015-08-31 MED ORDER — PHENYLEPHRINE HCL 10 MG/ML IJ SOLN
INTRAMUSCULAR | Status: DC | PRN
  Administered 2015-08-31 (×2): 80 ug via INTRAVENOUS

## 2015-08-31 MED ORDER — LIDOCAINE HCL (CARDIAC) 20 MG/ML IV SOLN
INTRAVENOUS | Status: AC
  Filled 2015-08-31: qty 5

## 2015-08-31 MED ORDER — PROPOFOL 10 MG/ML IV BOLUS
INTRAVENOUS | Status: AC
  Filled 2015-08-31: qty 20

## 2015-08-31 MED ORDER — SODIUM CHLORIDE 0.9 % IV SOLN: INTRAVENOUS | Status: DC

## 2015-08-31 MED ORDER — PHENYLEPHRINE 40 MCG/ML (10ML) SYRINGE FOR IV PUSH (FOR BLOOD PRESSURE SUPPORT)
PREFILLED_SYRINGE | INTRAVENOUS | Status: AC
  Filled 2015-08-31: qty 10

## 2015-08-31 MED ORDER — ONDANSETRON HCL 4 MG/2ML IJ SOLN
INTRAMUSCULAR | Status: AC
  Filled 2015-08-31: qty 2

## 2015-08-31 MED ORDER — LACTATED RINGERS IV SOLN
INTRAVENOUS | Status: DC | PRN
  Administered 2015-08-31: 12:00:00 via INTRAVENOUS

## 2015-08-31 MED ORDER — FENTANYL CITRATE (PF) 100 MCG/2ML IJ SOLN
INTRAMUSCULAR | Status: DC | PRN
  Administered 2015-08-31 (×2): 50 ug via INTRAVENOUS

## 2015-08-31 MED ORDER — PROPOFOL 10 MG/ML IV BOLUS
INTRAVENOUS | Status: DC | PRN
  Administered 2015-08-31: 100 mg via INTRAVENOUS

## 2015-08-31 NOTE — Anesthesia Procedure Notes (Signed)
Procedure Name: Intubation Date/Time: 08/31/2015 12:16 PM Performed by: Lollie Sails Pre-anesthesia Checklist: Patient identified, Emergency Drugs available, Suction available, Patient being monitored and Timeout performed Patient Re-evaluated:Patient Re-evaluated prior to inductionOxygen Delivery Method: Circle system utilized Preoxygenation: Pre-oxygenation with 100% oxygen Intubation Type: IV induction, Rapid sequence and Cricoid Pressure applied Ventilation: Mask ventilation without difficulty Laryngoscope Size: Miller and 2 Grade View: Grade I Tube type: Oral Tube size: 7.5 mm Number of attempts: 1 Airway Equipment and Method: Stylet Placement Confirmation: ETT inserted through vocal cords under direct vision,  positive ETCO2 and breath sounds checked- equal and bilateral Secured at: 21 cm Tube secured with: Tape Dental Injury: Teeth and Oropharynx as per pre-operative assessment

## 2015-08-31 NOTE — Progress Notes (Signed)
Patient ID: Kim Donovan, female   DOB: [birth year], 82 y.o.   MRN: [id number]    Referring Physician(s): Sherrill/Pyrtle  Chief Complaint:  Metastatic renal cell cancer, biliary obstruction, biloma  Subjective:  Pt doing fairly well; a little tired ; had duodenal stent placement today; denies sig abd pain, n/v; NG removed  Allergies: Tramadol  Medications: Prior to Admission medications   Medication Sig Start Date End Date Taking? Authorizing Provider  ALPRAZolam (XANAX) 0.25 MG tablet Take 1 tablet (0.25 mg total) by mouth at bedtime as needed for anxiety. 08/06/15  Yes Ladell Pier, MD  aspirin 81 MG tablet Take 81 mg by mouth daily.     Yes Historical Provider, MD  Calcium Citrate-Vitamin D 250-200 MG-UNIT TABS Take 2 tablets by mouth daily.    Yes Historical Provider, MD  Diphenhydramine-APAP, sleep, (TYLENOL PM EXTRA STRENGTH PO) Take 1 tablet by mouth at bedtime as needed (pain/sleep).    Yes Historical Provider, MD  dorzolamide-timolol (COSOPT) 22.3-6.8 MG/ML ophthalmic solution Place 1 drop into the left eye 2 (two) times daily.    Yes Historical Provider, MD  furosemide (LASIX) 20 MG tablet Take 1 tablet (20 mg total) by mouth daily. 05/15/15  Yes Larey Dresser, MD  Multiple Vitamins-Minerals (CENTRUM SILVER ULTRA WOMENS PO) Take 1 tablet by mouth daily.     Yes Historical Provider, MD  Omega-3 Fatty Acids (FISH OIL) 1000 MG CAPS Take 1 capsule by mouth daily. Reported on 08/06/2015   Yes Historical Provider, MD  pazopanib (VOTRIENT) 200 MG tablet Take 4 tablets (800 mg total) by mouth daily. Take on an empty stomach. 08/14/15  Yes Ladell Pier, MD  PREDNISONE, PAK, PO Take 2.5 mg by mouth every other day.    Yes Historical Provider, MD  ezetimibe (ZETIA) 10 MG tablet Take 1 tablet (10 mg total) by mouth daily. 05/12/14   Jolaine Artist, MD  potassium chloride (K-DUR) 10 MEQ tablet Take 1 tablet (10 mEq total) by mouth daily. Patient not taking: Reported on  08/14/2015 05/15/15   Larey Dresser, MD     Vital Signs: BP 139/78 mmHg  Pulse 76  Temp(Src) 97.4 F (36.3 C) (Oral)  Resp 20  Ht 5' 6" (1.676 m)  Wt 136 lb (61.689 kg)  BMI 21.96 kg/m2  SpO2 97%  Physical Exam awake/alert; I/E bil drain, RUQ biloma drain intact, insertion sites ok, NT, outputs - I/E drain 130 cc, biloma drain 50 cc; cx's neg to date   Imaging: Ct Abdomen Wo Contrast  08/27/2015  CLINICAL DATA:  Abscess EXAM: CT ABDOMEN WITHOUT CONTRAST TECHNIQUE: Multidetector CT imaging of the abdomen was performed following the standard protocol without IV contrast. COMPARISON:  08/23/2015 FINDINGS: The stomach is severely distended with fluid. The ill-defined fluid collection in the right lower quadrant measure 6.1 x 5.3 cm. It has slightly enlarged since the prior study. Biliary dilatation persists. Tumor infiltration of the right kidney is unchanged. No left hydronephrosis. Small right pleural effusion is stable. Patchy airspace disease at the left lung base has slightly increased. No free-fluid in the abdomen.  No free intraperitoneal gas. IMPRESSION: The stomach is severely distended. This presents an aspiration risk with moderate sedation. NG tube decompression of the stomach is recommended prior to abscess drainage. The fluid collection in question within the right lower quadrant has slightly enlarged. Stable biliary dilatation. Increasing patchy airspace disease at the left lung base. Consider aspiration pneumonia. Electronically Signed   By: Rodena Goldmann.D.  On: 08/27/2015 16:17   Dg Abd 1 View - Kub  08/31/2015  CLINICAL DATA:  Gastric obstruction. Biliary ductal dilatation. Abscess. Right renal neoplasm. EXAM: DG C-ARM 61-120 MIN; ABDOMEN - 1 VIEW COMPARISON:  None. FINDINGS: Two cross-table lateral fluoroscopic spot images show placement of an internal wall stent in the expected region of the duodenum. Two percutaneous pigtail catheters are also seen in this region.  IMPRESSION: Internal wall stent seen expected region of duodenum. Percutaneous pigtail catheter is also seen in this region. Electronically Signed   By: Earle Gell M.D.   On: 08/31/2015 13:59   Dg Abd 1 View  08/28/2015  CLINICAL DATA:  Biliary obstruction. EXAM: ABDOMEN - 1 VIEW COMPARISON:  CT 08/28/2015. FINDINGS: Endoscopic tube noted. No contrast noted in the biliary system, by history patient has a duodenum stricture that did not allow cannulation of the biliary ductal system. NG tube noted . Right upper quadrant catheter noted. IMPRESSION: No contrast in the biliary system. By history patient has a duodenum stricture not allowing cannulation of the biliary system. 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 Additionally, she has a biliary leak. She requires percutaneous transhepatic cholangiogram with attempted placement of an internal/ external biliary drainage catheter both for biliary decompression and for bile diversion. EXAM: IR INT-EXT BILIARY DRAIN W/ CHOLANGIOGRAM; SINUS TRACT INJECTION/FISTULOGRAM Date: 08/29/2015 PROCEDURE: 1. Contrast injection through existing drainage catheter 2. Ultrasound-guided puncture of the left biliary tree 3. Placement of an internal/external biliary drainage catheter using fluoroscopic guidance Interventional Radiologist:  Criselda Peaches, MD ANESTHESIA/SEDATION: Moderate (conscious) sedation was used. 4 mg Versed, 150 mcg Fentanyl were administered intravenously. The patient's vital signs were monitored continuously by radiology nursing throughout the procedure. Sedation Time: 30 minutes MEDICATIONS: 3.375 g Zosyn administered intravenously FLUOROSCOPY TIME:  11 minutes 42 seconds for a total of 170 mGy CONTRAST:  44m OMNIPAQUE IOHEXOL 300 MG/ML  SOLN TECHNIQUE: Informed consent was obtained from the  patient following explanation of the procedure, risks, benefits and alternatives. The patient understands, agrees and consents for the procedure. All questions were addressed. A time out was performed. Maximal barrier sterile technique utilized including caps, mask, sterile gowns, sterile gloves, large sterile drape, hand hygiene, and Betadine skin prep. The existing right upper quadrant drainage catheter was gently hand injected with contrast material under real-time fluoroscopic guidance. The contrast material fills a collapsed cavity and then makes a clear fistulous connection with the right posterior biliary tree. There is partial opacification of the dilated right biliary tree. Ultrasound was next used to interrogate the mid epigastric region. The left intrahepatic ducts are significantly dilated. A suitable peripheral segment 3 duct was successfully identified. Local anesthesia was attained by infiltration with 1% lidocaine. A small dermatotomy was made. Under real-time sonographic guidance, the bile duct was punctured with a 21 gauge micropuncture needle. There was return of bile to the needle hub. A gentle hand injection of contrast material confirmed needle placement within the biliary tree. A night tracks wire was successfully advanced into the dilated common hepatic duct. The Accustick sheath was then advanced over the wire and positioned in the dilated common hepatic duct. Transhepatic cholangiography was then performed. There is marked dilatation of the common hepatic duct. There is a high-grade stenosis bordering on occlusion at the juncture of the common hepatic and common bile ducts. The  mid and distal common bile duct are patent in the ampulla is patent. A hydrophilic roadrunner wire was successfully navigated across the obstruction and into the duodenum. The Accustick sheath was then exchanged over the wire. The tract was dilated to 10 [place] and a [place] biliary drainage catheter was  advanced over the wire and formed with the locking loop in the duodenum. The proximal side holes extend into the accessed segment 3 duct. Contrast injection through the tube confirms the tube location. The tube was secured to the skin with 0 Prolene suture and an adhesive fixation device. The tube was connected to gravity bag drainage. COMPLICATIONS: None Estimated blood loss:  0 IMPRESSION: 1. Contrast injection through the existing right upper quadrant abscess drain confirms a fistulous connection with the right posterior intrahepatic biliary ducts. Findings are consistent with small biliary leak. 2. Percutaneous transhepatic cholangiography demonstrates severe intra and extrahepatic biliary ductal dilatation. There is high-grade stenosis bordering on occlusion at the juncture of the common hepatic and common bile ducts. The distal common bile duct and ampulla are patent. 3. Successful placement of an internal/external 10 [place] biliary drainage catheter. PLAN: 1. Maintain biliary drain to bag drainage until bilirubin has significantly trended down. Monitor electrolytes and replete as needed while in-house. The tube should be capped prior to discharge to prevent significant dehydration and electrolyte imbalance. 2. The right upper quadrant biloma drainage catheter should be monitored. When output has ceased, this tube can be removed. If the tube remains in place at the time of discharge patient should follow-up in interventional radiology drain clinic at 1 week following discharge. 3. Patient to undergo upper endoscopy and placement of a duodenal stent by gastroenterology at their . 4. Return to interventional radiology in 1 month for probable placement of an internal covered metal (Wall Flex) stent for continued palliative a decompression of the biliary tree. Signed, Criselda Peaches, MD [facility]iology Electronically Signed   By: Jacqulynn Cadet M.D.    On: 08/29/2015 16:07   Dg Abd Portable 1v  08/27/2015  CLINICAL DATA:  82 year old female status post nasogastric tube placement. EXAM: PORTABLE ABDOMEN - 1 VIEW COMPARISON:  Abdominal radiograph 08/22/2015. FINDINGS: Nasogastric tube in position with tip in the body of the stomach. Gas, oral contrast material and stool are seen scattered throughout the colon extending to the level of the distal rectum. No pathologic distension of small bowel is noted. No gross evidence of pneumoperitoneum. Surgical clips are noted in the anatomic pelvis. IMPRESSION: 1. Tip of nasogastric tube is in the body of the stomach. Electronically Signed   By: Vinnie Langton M.D.   On: 08/27/2015 17:33   Dg C-arm 1-60 Min  08/31/2015  CLINICAL DATA:  Gastric obstruction. Biliary ductal dilatation. Abscess. Right renal neoplasm. EXAM: DG C-ARM 61-120 MIN; ABDOMEN - 1 VIEW COMPARISON:  None. FINDINGS: Two cross-table lateral fluoroscopic spot images show placement of an internal wall stent in the expected region of the duodenum. Two percutaneous pigtail catheters are also seen in this region. IMPRESSION: Internal wall stent seen expected region of duodenum. Percutaneous pigtail catheter is also seen in this region. Electronically Signed   By: Earle Gell M.D.   On: 08/31/2015 13:59   Dg C-arm 1-60 Min-no Report  08/28/2015  CLINICAL DATA: duodenal stricture C-ARM 1-60 MINUTES Fluoroscopy was utilized by the requesting physician.  No radiographic interpretation.   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 Additionally, she has a biliary leak. She requires percutaneous transhepatic cholangiogram with attempted placement of an internal/ external biliary drainage catheter both for biliary decompression and for bile diversion. EXAM: IR INT-EXT BILIARY DRAIN W/ CHOLANGIOGRAM; SINUS TRACT INJECTION/FISTULOGRAM Date: 08/29/2015 PROCEDURE:  1. Contrast injection through existing drainage catheter 2. Ultrasound-guided puncture of the left biliary tree 3. Placement of an internal/external biliary drainage catheter using fluoroscopic guidance Interventional Radiologist:  Criselda Peaches, MD ANESTHESIA/SEDATION: Moderate (conscious) sedation was used. 4 mg Versed, 150 mcg Fentanyl were administered intravenously. The patient's vital signs were monitored continuously by radiology nursing throughout the procedure. Sedation Time: 30 minutes MEDICATIONS: 3.375 g Zosyn administered intravenously FLUOROSCOPY TIME:  11 minutes 42 seconds for a total of 170 mGy CONTRAST:  60m OMNIPAQUE IOHEXOL 300 MG/ML  SOLN TECHNIQUE: Informed consent was obtained from the patient following explanation of the procedure, risks, benefits and alternatives. The patient understands, agrees and consents for the procedure. All questions were addressed. A time out was performed. Maximal barrier sterile technique utilized including caps, mask, sterile gowns, sterile gloves, large sterile drape, hand hygiene, and Betadine skin prep. The existing right upper quadrant drainage catheter was gently hand injected with contrast material under real-time fluoroscopic guidance. The contrast material fills a collapsed cavity and then makes a clear fistulous connection with the right posterior biliary tree. There is partial opacification of the dilated right biliary tree. Ultrasound was next used to interrogate the mid epigastric region. The left intrahepatic ducts are significantly dilated. A suitable peripheral segment 3 duct was successfully identified. Local anesthesia was attained by infiltration with 1% lidocaine. A small dermatotomy was made. Under real-time sonographic guidance, the bile duct was punctured with a 21 gauge micropuncture needle. There was return of bile to the needle hub. A gentle hand injection of contrast material confirmed needle placement within the biliary tree. A  night tracks wire was successfully advanced into the dilated common hepatic duct. The Accustick sheath was then advanced over the wire and positioned in the dilated common hepatic duct. Transhepatic cholangiography was then performed. There is marked dilatation of the common hepatic duct. There is a high-grade stenosis bordering on occlusion at the juncture of the common hepatic and common bile ducts. The mid and distal common bile duct are patent in the ampulla is patent. A hydrophilic roadrunner wire was successfully navigated across the obstruction and into the duodenum. The Accustick sheath was then exchanged over the wire. The tract was dilated to 10 [place] drainage catheter was advanced over the wire and formed with the locking loop in the duodenum. The proximal side holes extend into the accessed segment 3 duct. Contrast injection through the tube confirms the tube location. The tube was secured to the skin with 0 Prolene suture and an adhesive fixation device. The tube was connected to gravity bag drainage. COMPLICATIONS: None Estimated blood loss:  0 IMPRESSION: 1. Contrast injection through the existing right upper quadrant abscess drain confirms a fistulous connection with the right posterior intrahepatic biliary ducts. Findings are consistent with small biliary leak. 2. Percutaneous transhepatic cholangiography demonstrates severe intra and extrahepatic biliary ductal dilatation. There is high-grade stenosis bordering on occlusion at the juncture of the common hepatic and common bile ducts. The distal common bile duct and ampulla are patent. 3. Successful placement of an internal/external 10 [place] drainage catheter. PLAN: 1. Maintain biliary drain to bag drainage until bilirubin has significantly  trended down. Monitor electrolytes and replete as needed while in-house. The tube should be capped prior to discharge to prevent significant dehydration and electrolyte  imbalance. 2. The right upper quadrant biloma drainage catheter should be monitored. When output has ceased, this tube can be removed. If the tube remains in place at the time of discharge patient should follow-up in interventional radiology drain clinic at 1 week following discharge. 3. Patient to undergo upper endoscopy and placement of a duodenal stent by gastroenterology at their . 4. Return to interventional radiology in 1 month for probable placement of an internal covered metal (Wall Flex) stent for continued palliative a decompression of the biliary tree. Signed, Criselda Peaches, MD [facility] Radiology Electronically Signed   By: Jacqulynn Cadet M.D.   On: 08/29/2015 16:07   Ct Image Guided Drainage By Percutaneous Catheter  08/28/2015  CLINICAL DATA:  Right Renal neoplasm. Liver lesion, post biopsy 08/10/2015. Postprocedure development of irregular fluid collection inferior to the caudate lobe of the liver. Abdominal pain. Gastric obstruction. EXAM: CT GUIDED DRAINAGE OF PERITONEAL ABSCESS ANESTHESIA/SEDATION: Clip Intravenous Fentanyl and Versed were administered as conscious sedation during continuous cardiorespiratory monitoring by the radiology RN, with a total moderate sedation time of 16 minutes. PROCEDURE: The procedure, risks, benefits, and alternatives were explained to the patient. Questions regarding the procedure were encouraged and answered. The patient understands and consents to the procedure. Select axial scans through the mid abdomen were obtained. The collection was localized and an appropriate skin entry site determined and marked. The operative field was prepped with Betadinein a sterile fashion, and a sterile drape was applied covering the operative field. A sterile gown and sterile gloves were used for the procedure. Local anesthesia was provided with 1% Lidocaine. Under intermittent CT guidance, an 18 gauge percutaneous entry  needle was advanced into the collection. Bilious fluid could be aspirated. A guidewire advanced within the collection, its position confirmed on CT. Tract dilated to facilitate placement of a 12 French pigtail catheter within the collection. A sample of the aspirate was sent for Gram stain, culture and sensitivity. Catheter secured externally with 0 Prolene suture and StatLock and placed to gravity bag. The patient tolerated the procedure well. COMPLICATIONS: None immediate FINDINGS: Limited CT confirms peritoneal fluid collection inferior to the right lobe of the liver and gallbladder fossa. 12French pigtail drain placed under CT guidance. A sample of the Bilious aspirate was sent for Gram stain, culture and sensitivity. IMPRESSION: 1. Technically successful 12 French pigtail drain catheter into right upper quadrant peritoneal collection under CT guidance. Electronically Signed   By: Lucrezia Europe M.D.   On: 08/28/2015 13:46    Labs:  CBC:  Recent Labs  08/28/15 0513 08/29/15 0530 08/30/15 0515 08/31/15 0500  WBC 11.3* 9.2 9.8 9.8  HGB 12.9 13.3 12.1 11.6*  HCT 39.9 39.6 37.7 35.7*  PLT 353 340 344 268    COAGS:  Recent Labs  08/10/15 0715 08/20/15 0510 08/29/15 1050  INR 0.99 1.24 1.20  APTT  --  38*  --     BMP:  Recent Labs  08/28/15 0513 08/29/15 0530 08/30/15 0515 08/31/15 0500  NA 140 144 140 140  K 4.2 4.1 3.5 3.4*  CL 99* 102 99* 103  CO2 _0 GLUCOSE 120* 121* 116* 73  BUN 19 29* 40* 27*  CALCIUM 9.0 9.3 8.9 8.4*  CREATININE 1.17* 1.59* 1.58* 1.03*  GFRNONAA 42* 29* 29* 49*  GFRAA  48* 33* 34* 56*    LIVER FUNCTION TESTS:  Recent Labs  08/28/15 0513 08/29/15 0530 08/30/15 0515 08/31/15 1424  BILITOT 2.1* 0.8 1.2 1.2  AST 241* 136* 62* 39  ALT 199* 171* 110* 69*  ALKPHOS 412* 353* 254* 200*  PROT 7.3 7.5 6.7 6.5  ALBUMIN 2.8* 3.1* 2.7* 2.7*    Assessment and Plan: Pt with hx met RCC with biliary obst/biloma, s/p RUQ biloma drainage 1/10,  PTC with I/E biliary drain, RUQ drain injection 1/11; injection of RUQ drain shows fistula to rt post intrahepatic biliary ducts c/w bile leak; PTC shows severe intra and extrahepatic biliary ductal dilatation with high-grade stenosis bordering on occlusion at the juncture of the common hepatic and common bile ducts. The distal common bile duct and ampulla were patent; pt s/p duodenal stent today; AF; t bili 1.2; WBC nl; hgb 11.6; creat 1.03, K 3.4; I/E biliary drain capped today- if fails capping then reattach to bag drain; monitor electrolytes/hepatic function; cont biloma drain until output ceases- if pt d/c'd home before tube can come out then can f/u in IR clinic in 1 week after discharge; return to IR in 4 weeks for attempted biliary metal stent placement.  Electronically Signed: D. Rowe Robert 08/31/2015, 3:53 PM   I spent a total of 15 minutes at the the patient's bedside AND on the patient's hospital floor or unit, greater than 50% of which was counseling/coordinating care for biliary drains

## 2015-08-31 NOTE — Anesthesia Preprocedure Evaluation (Signed)
Anesthesia Evaluation  Patient identified by MRN, date of birth, ID band Patient awake    Reviewed: Allergy & Precautions, NPO status , Patient's Chart, lab work & pertinent test results  Airway Mallampati: II  TM Distance: >3 FB Neck ROM: Full    Dental  (+) Teeth Intact, Dental Advisory Given, Caps   Pulmonary pneumonia, former smoker,     + decreased breath sounds      Cardiovascular hypertension, + Peripheral Vascular Disease and +CHF  Normal cardiovascular examValvular problems/murmurs: s/p bioprosthetic AVR 2011.  Rhythm:Regular Rate:Normal  TTE 05/15/15: Study Conclusions  - Left ventricle: Global LV longitudinal strain is -18% The cavitysize was normal. Systolic function was normal. The estimatedejection fraction was in the range of 55% to 60%. Wall motion wasnormal; there were no regional wall motion abnormalities. Features are consistent with a pseudonormal left ventricularfilling pattern, with concomitant abnormal relaxation andincreased filling pressure (grade 2 diastolic dysfunction). - Aortic valve: Poorly visualized. Severely calcified annulus.Probably trileaflet. Moderate diffuse thickening andcalcification. - Mitral valve: Calcified annulus. There was trivial regurgitation. - Pulmonary arteries: PA peak pressure: 32 mm Hg (S).  Patient no longer has AS, had a bioprosthetic valve repair in 2011, mean gradient is 10 mmHg which is not even moderate AS     Neuro/Psych PSYCHIATRIC DISORDERS Depression negative neurological ROS     GI/Hepatic Neg liver ROS, GERD  Medicated,Nausea/vomiting   Endo/Other  negative endocrine ROS  Renal/GU Renal diseaseRenal cell carcinoma  With liver mets     Musculoskeletal  (+) Arthritis  (polymyalgia rheumatica),   Abdominal   Peds  Hematology negative hematology ROS (+)   Anesthesia Other Findings Day of surgery medications reviewed with the patient.  Reproductive/Obstetrics                             Anesthesia Physical  Anesthesia Plan  ASA: III  Anesthesia Plan: General   Post-op Pain Management:    Induction: Intravenous  Airway Management Planned: Oral ETT  Additional Equipment:   Intra-op Plan:   Post-operative Plan: Extubation in OR  Informed Consent: I have reviewed the patients History and Physical, chart, labs and discussed the procedure including the risks, benefits and alternatives for the proposed anesthesia with the patient or authorized representative who has indicated his/her understanding and acceptance.   Dental advisory given  Plan Discussed with: CRNA  Anesthesia Plan Comments:         Anesthesia Quick Evaluation

## 2015-08-31 NOTE — Transfer of Care (Signed)
Immediate Anesthesia Transfer of Care Note  Patient: Kim Donovan  Procedure(s) Performed: Procedure(s): ESOPHAGOGASTRODUODENOSCOPY (EGD), with stent (N/A)  Patient Location: PACU and Endoscopy Unit  Anesthesia Type:General  Level of Consciousness: awake and responds to stimulation  Airway & Oxygen Therapy: Patient Spontanous Breathing and Patient connected to face mask oxygen  Post-op Assessment: Report given to RN and Post -op Vital signs reviewed and stable  Post vital signs: Reviewed and stable  Last Vitals:  Filed Vitals:   08/31/15 0634 08/31/15 1136  BP: 125/64 143/84  Pulse: 90 78  Temp: 36.4 C 36.5 C  Resp: 20 18    Complications: No apparent anesthesia complications

## 2015-08-31 NOTE — Progress Notes (Signed)
CSW following for return to [facility] when medically ready. CSW has completed FL2 & will continue to follow and assist with return.    Raynaldo Opitz, [facility] Clinical Social Worker cell #: [phone]

## 2015-08-31 NOTE — Progress Notes (Signed)
PROGRESS NOTE  Kim Donovan AXE:[id number] DOB: [birth year] DOA: 08/19/2015 PCP: Mathews Argyle, MD   HPI: 82 yo F with recently diagnosed metastatic renal cell carcinoma (preliminary pathology), additional immunohistochemical histochemical stains most consistent with a poorly differentiated urothelial carcinoma, admitted on 1/2 with nausea and vomiting, found to have biliary and duodenal obstruction.   Subjective / 24 H Interval events - sore throat when coughing - denies chest pain, nausea/vomiting - endorses "soreness" at the drain sites  Assessment/Plan: Principal Problem:   Sepsis ([place]) Active Problems:   GLAUCOMA   Polymyalgia rheumatica (HCC)   S/P AVR (aortic valve replacement)   Chronic diastolic CHF (congestive heart failure) (HCC)   Renal mass, right   Liver masses   AKI (acute kidney injury) ([place])   Nausea and vomiting   Hyponatremia   Essential hypertension   Intraabdominal fluid collection   Hepatic metastases (HCC)   CAP (community acquired pneumonia)   Elevated lipase   Transaminitis   Fever, unspecified   Abdominal fluid collection   Metastatic renal cell carcinoma (HCC)   Elevated LFTs   Acquired dilation of bile duct   Gastric outlet obstruction   Abnormal CT scan, stomach   Biliary obstruction   Bile duct rupture   Biliary obstruction due to malignant neoplasm  Sepsis due to probable aspiration pneumonia - CT scan 1/6 with left lower lobe infiltrate - on Zosyn, started on 1/2, continue until biliary cultures remain negative  Stage IV urothelial carcinoma, with liver mets - Dr. Benay Spice following, appreciate input.  - evaluate for outpatient chemotherapy  Biliary obstruction - CT scan on 1/6 with biliary dilatation, "Stable intrahepatic biliary dilatation with an abrupt cut off of the common bile duct" - elevated Alk phos, LFTs and bilirubin.  - now with 2 drains, one in the RUQ peritoneal fluid collection, also s/p PTC and placement of  a 39F internal/external biliary drain on 1/11 by IR. There appears to be a fistulous connection between the fluid collection and the right posterior intrahepatic biliary ducts. - monitor drain outputs, remove RUQ drain once it stops draining and cap PTC drain prior to d/c  Duodenal obstruction  - plan for stent today  RUQ peritoneal fluid collection - s/p drain placement per IR on 1/10, draining bilious fluid - cultures without growth so far, pending  AKI on CKD III - suspect pre-renal on top of CKD, it appears that her GFR in 2016 was ~40, prior to that in 2012 within normal limits - renal function improving today with fluids  Nausea/vomiting - NG tube placement 1/9, continue as long as she has duodenal obstruction - continue supportive treatment  HTN - stable  PMR  S/p bioprosthetic AVR  Chronic diastolic CHF - watch fluid status carefully, 2D echo 04/2014 with normal EF 11-03%, grade 1 diastolic dysfunction   Diet: Diet NPO time specified Except for: [employer], Sips with Meds Fluids: D5 1/2 NS DVT Prophylaxis: Lovenox  Code Status: DNR Family Communication: no family bedside Disposition Plan: SNF when ready   Barriers to discharge: duodenal stent today  Consultants:  IR  GI  Oncology   Procedures:  EGD 1/10 ENDOSCOPIC IMPRESSION: 1. Severe stricture in the 2nd part of the duodenum, ERCP not attempted 2. Large volume of food residue in the gastric fundus and gastric body 3. Stricture at the gastroesophageal junction   Antibiotics Vancomycin 08/20/15>08/23/15 Zosyn 08/20/15>08/24/15, 08/27/15>> Zithromax 08/22/15>08/24/15 Levaquin 08/24/15> 08/26/2015   Studies  Ir Sinus/fist Tube Chk-non Gi  08/29/2015  CLINICAL DATA:  82 year old female with malignant duodenum all and biliary obstruction. Additionally, she has a biliary leak. She requires percutaneous transhepatic cholangiogram with attempted placement of an internal/ external biliary drainage catheter both  for biliary decompression and for bile diversion. EXAM: IR INT-EXT BILIARY DRAIN W/ CHOLANGIOGRAM; SINUS TRACT INJECTION/FISTULOGRAM Date: 08/29/2015 PROCEDURE: 1. Contrast injection through existing drainage catheter 2. Ultrasound-guided puncture of the left biliary tree 3. Placement of an internal/external biliary drainage catheter using fluoroscopic guidance Interventional Radiologist:  Criselda Peaches, MD ANESTHESIA/SEDATION: Moderate (conscious) sedation was used. 4 mg Versed, 150 mcg Fentanyl were administered intravenously. The patient's vital signs were monitored continuously by radiology nursing throughout the procedure. Sedation Time: 30 minutes MEDICATIONS: 3.375 g Zosyn administered intravenously FLUOROSCOPY TIME:  11 minutes 42 seconds for a total of 170 mGy CONTRAST:  3m OMNIPAQUE IOHEXOL 300 MG/ML  SOLN TECHNIQUE: Informed consent was obtained from the patient following explanation of the procedure, risks, benefits and alternatives. The patient understands, agrees and consents for the procedure. All questions were addressed. A time out was performed. Maximal barrier sterile technique utilized including caps, mask, sterile gowns, sterile gloves, large sterile drape, hand hygiene, and Betadine skin prep. The existing right upper quadrant drainage catheter was gently hand injected with contrast material under real-time fluoroscopic guidance. The contrast material fills a collapsed cavity and then makes a clear fistulous connection with the right posterior biliary tree. There is partial opacification of the dilated right biliary tree. Ultrasound was next used to interrogate the mid epigastric region. The left intrahepatic ducts are significantly dilated. A suitable peripheral segment 3 duct was successfully identified. Local anesthesia was attained by infiltration with 1% lidocaine. A small dermatotomy was made. Under real-time sonographic guidance, the bile duct was punctured with a 21 gauge  micropuncture needle. There was return of bile to the needle hub. A gentle hand injection of contrast material confirmed needle placement within the biliary tree. A night tracks wire was successfully advanced into the dilated common hepatic duct. The Accustick sheath was then advanced over the wire and positioned in the dilated common hepatic duct. Transhepatic cholangiography was then performed. There is marked dilatation of the common hepatic duct. There is a high-grade stenosis bordering on occlusion at the juncture of the common hepatic and common bile ducts. The mid and distal common bile duct are patent in the ampulla is patent. A hydrophilic roadrunner wire was successfully navigated across the obstruction and into the duodenum. The Accustick sheath was then exchanged over the wire. The tract was dilated to 10 [place] a [place] drainage catheter was advanced over the wire and formed with the locking loop in the duodenum. The proximal side holes extend into the accessed segment 3 duct. Contrast injection through the tube confirms the tube location. The tube was secured to the skin with 0 Prolene suture and an adhesive fixation device. The tube was connected to gravity bag drainage. COMPLICATIONS: None Estimated blood loss:  0 IMPRESSION: 1. Contrast injection through the existing right upper quadrant abscess drain confirms a fistulous connection with the right posterior intrahepatic biliary ducts. Findings are consistent with small biliary leak. 2. Percutaneous transhepatic cholangiography demonstrates severe intra and extrahepatic biliary ductal dilatation. There is high-grade stenosis bordering on occlusion at the juncture of the common hepatic and common bile ducts. The distal common bile duct and ampulla are patent. 3. Successful placement of an internal/external 10 [place] drainage catheter. PLAN: 1. Maintain biliary drain to bag drainage until  bilirubin has significantly trended  down. Monitor electrolytes and replete as needed while in-house. The tube should be capped prior to discharge to prevent significant dehydration and electrolyte imbalance. 2. The right upper quadrant biloma drainage catheter should be monitored. When output has ceased, this tube can be removed. If the tube remains in place at the time of discharge patient should follow-up in interventional radiology drain clinic at 1 week following discharge. 3. Patient to undergo upper endoscopy and placement of a duodenal stent by gastroenterology at their . 4. Return to interventional radiology in 1 month for probable placement of an internal covered metal (Wall Flex) stent for continued palliative a decompression of the biliary tree. Signed, Criselda Peaches, MD [facility] Radiology Electronically Signed   By: Jacqulynn Cadet M.D.   On: 08/29/2015 16:07   Ir Int Lianne Cure Biliary Drain With Cholangiogram  08/29/2015  CLINICAL DATA:  82 year old female with malignant duodenum all and biliary obstruction. Additionally, she has a biliary leak. She requires percutaneous transhepatic cholangiogram with attempted placement of an internal/ external biliary drainage catheter both for biliary decompression and for bile diversion. EXAM: IR INT-EXT BILIARY DRAIN W/ CHOLANGIOGRAM; SINUS TRACT INJECTION/FISTULOGRAM Date: 08/29/2015 PROCEDURE: 1. Contrast injection through existing drainage catheter 2. Ultrasound-guided puncture of the left biliary tree 3. Placement of an internal/external biliary drainage catheter using fluoroscopic guidance Interventional Radiologist:  Criselda Peaches, MD ANESTHESIA/SEDATION: Moderate (conscious) sedation was used. 4 mg Versed, 150 mcg Fentanyl were administered intravenously. The patient's vital signs were monitored continuously by radiology nursing throughout the procedure. Sedation Time: 30 minutes MEDICATIONS: 3.375 g Zosyn administered  intravenously FLUOROSCOPY TIME:  11 minutes 42 seconds for a total of 170 mGy CONTRAST:  56m OMNIPAQUE IOHEXOL 300 MG/ML  SOLN TECHNIQUE: Informed consent was obtained from the patient following explanation of the procedure, risks, benefits and alternatives. The patient understands, agrees and consents for the procedure. All questions were addressed. A time out was performed. Maximal barrier sterile technique utilized including caps, mask, sterile gowns, sterile gloves, large sterile drape, hand hygiene, and Betadine skin prep. The existing right upper quadrant drainage catheter was gently hand injected with contrast material under real-time fluoroscopic guidance. The contrast material fills a collapsed cavity and then makes a clear fistulous connection with the right posterior biliary tree. There is partial opacification of the dilated right biliary tree. Ultrasound was next used to interrogate the mid epigastric region. The left intrahepatic ducts are significantly dilated. A suitable peripheral segment 3 duct was successfully identified. Local anesthesia was attained by infiltration with 1% lidocaine. A small dermatotomy was made. Under real-time sonographic guidance, the bile duct was punctured with a 21 gauge micropuncture needle. There was return of bile to the needle hub. A gentle hand injection of contrast material confirmed needle placement within the biliary tree. A night tracks wire was successfully advanced into the dilated common hepatic duct. The Accustick sheath was then advanced over the wire and positioned in the dilated common hepatic duct. Transhepatic cholangiography was then performed. There is marked dilatation of the common hepatic duct. There is a high-grade stenosis bordering on occlusion at the juncture of the common hepatic and common bile ducts. The mid and distal common bile duct are patent in the ampulla is patent. A hydrophilic roadrunner wire was successfully navigated across the  obstruction and into the duodenum. The Accustick sheath was then exchanged over the wire. The tract was dilated to 10 [place] a [place]10 [place]iliary  drainage catheter was advanced over the wire and formed with the locking loop in the duodenum. The proximal side holes extend into the accessed segment 3 duct. Contrast injection through the tube confirms the tube location. The tube was secured to the skin with 0 Prolene suture and an adhesive fixation device. The tube was connected to gravity bag drainage. COMPLICATIONS: None Estimated blood loss:  0 IMPRESSION: 1. Contrast injection through the existing right upper quadrant abscess drain confirms a fistulous connection with the right posterior intrahepatic biliary ducts. Findings are consistent with small biliary leak. 2. Percutaneous transhepatic cholangiography demonstrates severe intra and extrahepatic biliary ductal dilatation. There is high-grade stenosis bordering on occlusion at the juncture of the common hepatic and common bile ducts. The distal common bile duct and ampulla are patent. 3. Successful placement of an internal/external 10 [place] biliary drainage catheter. PLAN: 1. Maintain biliary drain to bag drainage until bilirubin has significantly trended down. Monitor electrolytes and replete as needed while in-house. The tube should be capped prior to discharge to prevent significant dehydration and electrolyte imbalance. 2. The right upper quadrant biloma drainage catheter should be monitored. When output has ceased, this tube can be removed. If the tube remains in place at the time of discharge patient should follow-up in interventional radiology drain clinic at 1 week following discharge. 3. Patient to undergo upper endoscopy and placement of a duodenal stent by gastroenterology at their . 4. Return to interventional radiology in 1 month for probable placement of an internal covered metal (Wall Flex) stent for continued palliative a decompression  of the biliary tree. Signed, Criselda Peaches, MD [facility] Radiology Electronically Signed   By: Jacqulynn Cadet M.D.   On: 08/29/2015 16:07    Objective  Filed Vitals:   08/30/15 1341 08/30/15 2205 08/31/15 0634 08/31/15 1136  BP: 132/86 128/83 125/64 143/84  Pulse: 74 72 90 78  Temp: 97.5 F (36.4 C) 97.5 F (36.4 C) 97.6 F (36.4 C) 97.7 F (36.5 C)  TempSrc: Oral Oral Axillary Oral  Resp: '20 20 20 18  ' Height:    '5\' 6"'  (1.676 m)  Weight:    61.689 kg (136 lb)  SpO2: 98% 97% 99% 97%    Intake/Output Summary (Last 24 hours) at 08/31/15 1207 Last data filed at 08/31/15 0943  Gross per 24 hour  Intake   1855 ml  Output   1345 ml  Net    510 ml   Filed Weights   08/20/15 0431 08/31/15 1136  Weight: 61.7 kg (136 lb 0.4 oz) 61.689 kg (136 lb)    Exam:  GENERAL: NAD, pleasant, NG tube in place  HEENT: no scleral icterus, PERRL  NECK: supple  LUNGS: CTA biL, no wheezing  HEART: RRR without MRG  ABDOMEN: soft, non tender. 2 drains present, bag full on the PTC drain  EXTREMITIES: no clubbing / cyanosis  NEUROLOGIC: non focal  PSYCHIATRIC: normal mood and affect   Data Reviewed: Basic Metabolic Panel:  Recent Labs Lab 08/27/15 0540 08/28/15 0513 08/29/15 0530 08/30/15 0515 08/31/15 0500  NA 138 140 144 140 140  K 4.0 4.2 4.1 3.5 3.4*  CL 98* 99* 102 99* 103  CO2 '31 29 27 27 22  ' GLUCOSE 122* 120* 121* 116* 73  BUN 19 19 29* 40* 27*  CREATININE 1.16* 1.17* 1.59* 1.58* 1.03*  CALCIUM 9.0 9.0 9.3 8.9 8.4*   Liver Function Tests:  Recent Labs Lab 08/25/15 0430 08/27/15 0540 08/28/15 3790  08/29/15 0530 08/30/15 0515  AST 56* 149* 241* 136* 62*  ALT 42 98* 199* 171* 110*  ALKPHOS 196* 352* 412* 353* 254*  BILITOT 1.2 1.7* 2.1* 0.8 1.2  PROT 6.8 6.9 7.3 7.5 6.7  ALBUMIN 2.7* 2.7* 2.8* 3.1* 2.7*    Recent Labs Lab 08/26/15 0405 08/27/15 0540 08/28/15 0513  LIPASE 61* 49 46  AMYLASE  113* 112* 99   CBC:  Recent Labs Lab 08/27/15 0540 08/28/15 0513 08/29/15 0530 08/30/15 0515 08/31/15 0500  WBC 10.1 11.3* 9.2 9.8 9.8  NEUTROABS 7.5 8.2* 7.8*  --   --   HGB 12.7 12.9 13.3 12.1 11.6*  HCT 38.7 39.9 39.6 37.7 35.7*  MCV 90.2 91.3 90.0 92.2 91.8  PLT 342 353 340 344 268   BNP (last 3 results)  Recent Labs  05/15/15 1115 06/15/15 0955  BNP 111.4* 100.1*   Recent Results (from the past 240 hour(s))  Culture, blood (routine x 2)     Status: None (Preliminary result)   Collection Time: 08/27/15 12:55 PM  Result Value Ref Range Status   Specimen Description BLOOD LEFT ARM  Final   Special Requests BOTTLES DRAWN AEROBIC AND ANAEROBIC 10CC  Final   Culture   Final    NO GROWTH 3 DAYS Performed at [facility]    Report Status PENDING  Incomplete  Culture, blood (routine x 2)     Status: None (Preliminary result)   Collection Time: 08/27/15  1:10 PM  Result Value Ref Range Status   Specimen Description BLOOD RIGHT HAND  Final   Special Requests IN PEDIATRIC BOTTLE 2CC  Final   Culture   Final    NO GROWTH 3 DAYS Performed at [facility]    Report Status PENDING  Incomplete  Culture, Urine     Status: None   Collection Time: 08/28/15 12:25 AM  Result Value Ref Range Status   Specimen Description URINE, RANDOM  Final   Special Requests NONE  Final   Culture   Final    20,000 COLONIES/mL YEAST Performed at [facility]    Report Status 08/29/2015 FINAL  Final  Culture, routine-abscess     Status: None   Collection Time: 08/28/15 12:58 PM  Result Value Ref Range Status   Specimen Description DRAINAGE RUQ CT DRAIN  Final   Special Requests NONE  Final   Gram Stain   Final    NO WBC SEEN NO SQUAMOUS EPITHELIAL CELLS SEEN NO ORGANISMS SEEN Performed at [facility]    Culture   Final    NO GROWTH 3 DAYS Performed at [facility]    Report Status 08/31/2015 FINAL  Final     Scheduled Meds: . [MAR Hold]  antiseptic oral rinse  7 mL Mouth Rinse BID  . [MAR Hold] dorzolamide-timolol  1 drop Left Eye BID  . [MAR Hold] enoxaparin (LOVENOX) injection  30 mg Subcutaneous Q24H  . [MAR Hold] indomethacin  100 mg Rectal Once  . [MAR Hold] lactose free nutrition  237 mL Oral TID WC  . [MAR Hold] pantoprazole (PROTONIX) IV  40 mg Intravenous Q24H  . [MAR Hold] piperacillin-tazobactam  3.375 g Intravenous Q8H  . [MAR Hold] sodium chloride  1,000 mL Intravenous Once  . [MAR Hold] sodium chloride  3 mL Intravenous Q12H   Continuous Infusions: . dextrose 5 % and 0.45% NaCl 75 mL/hr at 08/31/15 0524    Marzetta Board, MD [facility] Pager [phone]. If 7 PM -  7 AM, please contact night-coverage at [email or web address], password [facility] 08/31/2015, 12:07 PM  LOS: 11 days

## 2015-08-31 NOTE — Op Note (Signed)
[facility] [place], [address]   ENDOSCOPY PROCEDURE REPORT  PATIENT: Kim Donovan, Kim Donovan  MR#: [id number] BIRTHDATE: 1929-03-31 , 86  yrs. old GENDER: female ENDOSCOPIST: Jerene Bears, MD REFERRED BY:  [facility] PROCEDURE DATE:  08/31/2015 PROCEDURE:  EGD w/ transendoscopic stent ASA CLASS:     Class III INDICATIONS:  duodenal stricture secondary to metastatic renal cancer causing gastric outlet obstruction. MEDICATIONS: Per Anesthesia TOPICAL ANESTHETIC: none  DESCRIPTION OF PROCEDURE: After the risks benefits and alternatives of the procedure were thoroughly explained, informed consent was obtained.  The Pentax Gastroscope [id number] and [id number]) endoscope was introduced through the mouth and advanced to the second portion of the duodenum , Without limitations.  The instrument was slowly withdrawn as the mucosa was fully examined.      ESOPHAGUS: Esophagitis felt secondary to NG tube trauma in the mid an distal esophagus.  STOMACH: Moderate to severe nasogastric tube trauma was evident in the cardia and gastric body characterized by erythema, erosions and superficial ulceration without active bleeding.  DUODENUM: Traversable, extrinsic, acquired stenosis was found at the distal bulb extending to the 2nd part of the duodenum to the ampulla.  A previously placed biliary stent was visible protruding from the ampullary orifice.  Under fluoroscopic guidance a [employer] 9 cm uncovered metal duodenal stent was successfully deployed across the stricture.  The proximal end of the stent crosses the pylorus.  The stent position was confirmed with x-ray.   Retroflexed views revealed as previously described.     The scope was then withdrawn from the patient and the procedure completed.  COMPLICATIONS: There were no immediate complications.  ENDOSCOPIC IMPRESSION: 1.   Esophagitis secondary to NG tube trauma in the mid an  distal esophagus 2.   Nasogastric tube trauma was evident in the cardia and gastric body 3.  Acquired stenosis (malignant external compression) was found at the D1-D2 junction; successful placement of 9 cm duodenal stent across the stricture  RECOMMENDATIONS: 1.  Inpatient observation 2.  Daily PPI 3.  Liquid diet for 24-48 hours and then slowly advance to soft solid food as tolerated  eSigned:  Jerene Bears, MD 08/31/2015 1:22 PM    CC: the patient  PATIENT NAME:  Kim Donovan, Kim Donovan MR#: [id number]

## 2015-08-31 NOTE — Anesthesia Postprocedure Evaluation (Signed)
Anesthesia Post Note  Patient: ANNAJULIA FRETWELL  Procedure(s) Performed: Procedure(s) (LRB): ESOPHAGOGASTRODUODENOSCOPY (EGD), with stent (N/A)  Patient location during evaluation: PACU Anesthesia Type: General Level of consciousness: awake and alert Pain management: pain level controlled Vital Signs Assessment: post-procedure vital signs reviewed and stable Respiratory status: spontaneous breathing, nonlabored ventilation, respiratory function stable and patient connected to nasal cannula oxygen Cardiovascular status: blood pressure returned to baseline and stable Postop Assessment: no signs of nausea or vomiting Anesthetic complications: no    Last Vitals:  Filed Vitals:   08/31/15 1136 08/31/15 1307  BP: 143/84 151/81  Pulse: 78 75  Temp: 36.5 C 36.4 C  Resp: 18     Last Pain:  Filed Vitals:   08/31/15 1313  PainSc: 2                  Zenaida Deed

## 2015-09-01 LAB — COMPREHENSIVE METABOLIC PANEL
ALBUMIN: 2.3 g/dL — AB (ref 3.5–5.0)
ALT: 60 U/L — AB (ref 14–54)
AST: 41 U/L (ref 15–41)
Alkaline Phosphatase: 183 U/L — ABNORMAL HIGH (ref 38–126)
Anion gap: 11 (ref 5–15)
BUN: 20 mg/dL (ref 6–20)
CHLORIDE: 98 mmol/L — AB (ref 101–111)
CO2: 28 mmol/L (ref 22–32)
CREATININE: 0.97 mg/dL (ref 0.44–1.00)
Calcium: 8.2 mg/dL — ABNORMAL LOW (ref 8.9–10.3)
GFR calc Af Amer: >60 mL/min (ref >60)
GFR, EST NON AFRICAN AMERICAN: 52 mL/min — AB (ref >60)
GLUCOSE: 98 mg/dL (ref 65–99)
POTASSIUM: 3.4 mmol/L — AB (ref 3.5–5.1)
SODIUM: 137 mmol/L (ref 135–145)
Total Bilirubin: 1.3 mg/dL — ABNORMAL HIGH (ref 0.3–1.2)
Total Protein: 6.3 g/dL — ABNORMAL LOW (ref 6.5–8.1)

## 2015-09-01 LAB — CULTURE, BLOOD (ROUTINE X 2)
CULTURE: NO GROWTH
Culture: NO GROWTH

## 2015-09-01 NOTE — Progress Notes (Signed)
Subjective: No acute events.  Dry heaving last evening.  Objective: Vital signs in last 24 hours: Temp:  [97.4 F (36.3 C)-98 F (36.7 C)] 97.7 F (36.5 C) (01/14 0559) Pulse Rate:  [72-88] 88 (01/14 0559) Resp:  [18-21] 20 (01/14 0559) BP: (112-157)/(70-90) 123/76 mmHg (01/14 0559) SpO2:  [97 %-100 %] 98 % (01/14 0559) Weight:  [61.689 kg (136 lb)-61.8 kg (136 lb 3.9 oz)] 61.8 kg (136 lb 3.9 oz) (01/14 0559) Last BM Date: 08/29/15  Intake/Output from previous day: 01/13 0701 - 01/14 0700 In: 2710 [I.V.:2550; IV Piggyback:150] Out: 805 [Urine:375; Drains:430] Intake/Output this shift:    General appearance: alert and no distress GI: soft, non-tender; bowel sounds normal; no masses,  no organomegaly  Lab Results:  Recent Labs  08/30/15 0515 08/31/15 0500  WBC 9.8 9.8  HGB 12.1 11.6*  HCT 37.7 35.7*  PLT 344 268   BMET  Recent Labs  08/30/15 0515 08/31/15 0500 09/01/15 0537  NA 140 140 137  K 3.5 3.4* 3.4*  CL 99* 103 98*  CO2 27 22 28   GLUCOSE 116* 73 98  BUN 40* 27* 20  CREATININE 1.58* 1.03* 0.97  CALCIUM 8.9 8.4* 8.2*   LFT  Recent Labs  08/31/15 1424 09/01/15 0537  PROT 6.5 6.3*  ALBUMIN 2.7* 2.3*  AST 39 41  ALT 69* 60*  ALKPHOS 200* 183*  BILITOT 1.2 1.3*  BILIDIR 0.3  --   IBILI 0.9  --    PT/INR  Recent Labs  08/29/15 1050  LABPROT 15.4*  INR 1.20   Hepatitis Panel No results for input(s): HEPBSAG, HCVAB, HEPAIGM, HEPBIGM in the last 72 hours. C-Diff No results for input(s): CDIFFTOX in the last 72 hours. Fecal Lactopherrin No results for input(s): FECLLACTOFRN in the last 72 hours.  Studies/Results: Dg Abd 1 View - Kub  08/31/2015  CLINICAL DATA:  Gastric obstruction. Biliary ductal dilatation. Abscess. Right renal neoplasm. EXAM: DG C-ARM 61-120 MIN; ABDOMEN - 1 VIEW COMPARISON:  None. FINDINGS: Two cross-table lateral fluoroscopic spot images show placement of an internal wall stent in the expected region of the duodenum.  Two percutaneous pigtail catheters are also seen in this region. IMPRESSION: Internal wall stent seen expected region of duodenum. Percutaneous pigtail catheter is also seen in this region. Electronically Signed   By: Earle Gell M.D.   On: 08/31/2015 13:59   Dg C-arm 1-60 Min  08/31/2015  CLINICAL DATA:  Gastric obstruction. Biliary ductal dilatation. Abscess. Right renal neoplasm. EXAM: DG C-ARM 61-120 MIN; ABDOMEN - 1 VIEW COMPARISON:  None. FINDINGS: Two cross-table lateral fluoroscopic spot images show placement of an internal wall stent in the expected region of the duodenum. Two percutaneous pigtail catheters are also seen in this region. IMPRESSION: Internal wall stent seen expected region of duodenum. Percutaneous pigtail catheter is also seen in this region. Electronically Signed   By: Earle Gell M.D.   On: 08/31/2015 13:59    Medications:  Scheduled: . antiseptic oral rinse  7 mL Mouth Rinse BID  . dorzolamide-timolol  1 drop Left Eye BID  . enoxaparin (LOVENOX) injection  40 mg Subcutaneous Q24H  . indomethacin  100 mg Rectal Once  . lactose free nutrition  237 mL Oral TID WC  . pantoprazole (PROTONIX) IV  40 mg Intravenous Q24H  . piperacillin-tazobactam  3.375 g Intravenous Q8H  . sodium chloride  1,000 mL Intravenous Once  . sodium chloride  3 mL Intravenous Q12H   Continuous: . dextrose 5 % and  0.45% NaCl 75 mL/hr at 08/31/15 2241    Assessment/Plan: 1) Extrinsic duodenal obstruction s/p stent placement. 2) Metastatic renal cell carcinoma. 3) Bile leak s/p percutaneous drainage.   Clinically she is stable.  Her appetite is down, but she wants to try some clear liquids.  Plan: 1) Advance diet as tolerated. 2) Continue with supportive care.  3) Signing off.   LOS: 12 days   Austen Oyster D 09/01/2015, 8:18 AM

## 2015-09-01 NOTE — Progress Notes (Signed)
PROGRESS NOTE  Kim Donovan:[id number] DOB: [birth year] DOA: 08/19/2015 PCP: Mathews Argyle, MD   HPI: 82 yo F with recently diagnosed metastatic renal cell carcinoma (preliminary pathology), additional immunohistochemical histochemical stains most consistent with a poorly differentiated urothelial carcinoma, admitted on 1/2 with nausea and vomiting, found to have biliary and duodenal obstruction.   Subjective / 24 H Interval events - dry heaving overnight, will try some liquids this morning   Assessment/Plan: Principal Problem:   Sepsis ([place]) Active Problems:   GLAUCOMA   Polymyalgia rheumatica (HCC)   S/P AVR (aortic valve replacement)   Chronic diastolic CHF (congestive heart failure) (HCC)   Renal mass, right   Liver masses   AKI (acute kidney injury) (HCC)   Nausea and vomiting   Hyponatremia   Essential hypertension   Intraabdominal fluid collection   Hepatic metastases (HCC)   CAP (community acquired pneumonia)   Elevated lipase   Transaminitis   Fever, unspecified   Abdominal fluid collection   Metastatic renal cell carcinoma (HCC)   Elevated LFTs   Acquired dilation of bile duct   Gastric outlet obstruction   Abnormal CT scan, stomach   Biliary obstruction   Bile duct rupture   Biliary obstruction due to malignant neoplasm   Duodenal stenosis  Sepsis due to probable aspiration pneumonia - CT scan 1/6 with left lower lobe infiltrate - on Zosyn, started on 1/2, continue until biliary / blood cultures remain negative  Stage IV urothelial carcinoma, with liver mets - Dr. Benay Spice following, appreciate input.  - evaluate for outpatient chemotherapy  Biliary obstruction - CT scan on 1/6 with biliary dilatation, "Stable intrahepatic biliary dilatation with an abrupt cut off of the common bile duct" - elevated Alk phos, LFTs and bilirubin.  - now with 2 drains, one in the RUQ peritoneal fluid collection, also s/p PTC and placement of a 18F  internal/external biliary drain on 1/11 by IR. There appears to be a fistulous connection between the fluid collection and the right posterior intrahepatic biliary ducts. - monitor drain outputs, remove RUQ drain once it stops draining and cap PTC drain prior to d/c  Duodenal obstruction  - s/p stent placement 1/13 - advance diet slowly, for now maintain on clears  RUQ peritoneal fluid collection - s/p drain placement per IR on 1/10, draining bilious fluid - cultures without growth so far, pending - IR following  AKI on CKD III - suspect pre-renal on top of CKD, it appears that her GFR in 2016 was ~40, prior to that in 2012 within normal limits - renal function improving with fluids  Nausea/vomiting - NG tube now out - continue supportive treatment  HTN - stable  PMR  S/p bioprosthetic AVR  Chronic diastolic CHF - watch fluid status carefully, 2D echo 04/2014 with normal EF 95-62%, grade 1 diastolic dysfunction   Diet: Diet clear liquid Room service appropriate?: Yes; Fluid consistency:: Thin Fluids: D5 1/2 NS DVT Prophylaxis: Lovenox  Code Status: DNR Family Communication: d/w husband bedside Disposition Plan: SNF when ready   Barriers to discharge: duodenal stent today  Consultants:  IR  GI  Oncology   Procedures:  EGD 1/10 ENDOSCOPIC IMPRESSION: 1. Severe stricture in the 2nd part of the duodenum, ERCP not attempted 2. Large volume of food residue in the gastric fundus and gastric body 3. Stricture at the gastroesophageal junction  EGD 1/13 ENDOSCOPIC IMPRESSION: 1. Esophagitis secondary to NG tube trauma in the mid an distal esophagus 2. Nasogastric tube trauma was  evident in the cardia and gastric body 3. Acquired stenosis (malignant external compression) was found at the D1-D2 junction; successful placement of 9 cm duodenal stent across the stricture   Antibiotics Vancomycin 08/20/15>08/23/15 Zosyn 08/20/15>08/24/15, 08/27/15>> Zithromax  08/22/15>08/24/15 Levaquin 08/24/15> 08/26/2015   Studies  Dg Abd 1 View - Kub  08/31/2015  CLINICAL DATA:  Gastric obstruction. Biliary ductal dilatation. Abscess. Right renal neoplasm. EXAM: DG C-ARM 61-120 MIN; ABDOMEN - 1 VIEW COMPARISON:  None. FINDINGS: Two cross-table lateral fluoroscopic spot images show placement of an internal wall stent in the expected region of the duodenum. Two percutaneous pigtail catheters are also seen in this region. IMPRESSION: Internal wall stent seen expected region of duodenum. Percutaneous pigtail catheter is also seen in this region. Electronically Signed   By: Earle Gell M.D.   On: 08/31/2015 13:59   Dg C-arm 1-60 Min  08/31/2015  CLINICAL DATA:  Gastric obstruction. Biliary ductal dilatation. Abscess. Right renal neoplasm. EXAM: DG C-ARM 61-120 MIN; ABDOMEN - 1 VIEW COMPARISON:  None. FINDINGS: Two cross-table lateral fluoroscopic spot images show placement of an internal wall stent in the expected region of the duodenum. Two percutaneous pigtail catheters are also seen in this region. IMPRESSION: Internal wall stent seen expected region of duodenum. Percutaneous pigtail catheter is also seen in this region. Electronically Signed   By: Earle Gell M.D.   On: 08/31/2015 13:59    Objective  Filed Vitals:   08/31/15 1501 08/31/15 2137 09/01/15 0134 09/01/15 0559  BP: 139/78 112/74 120/70 123/76  Pulse: 76 87 85 88  Temp: 97.4 F (36.3 C) 97.5 F (36.4 C) 98 F (36.7 C) 97.7 F (36.5 C)  TempSrc: Oral Oral Oral Oral  Resp: '20 20 20 20  ' Height:      Weight:    61.8 kg (136 lb 3.9 oz)  SpO2: 97% 98% 97% 98%    Intake/Output Summary (Last 24 hours) at 09/01/15 1204 Last data filed at 09/01/15 0700  Gross per 24 hour  Intake   2655 ml  Output    675 ml  Net   1980 ml   Filed Weights   08/20/15 0431 08/31/15 1136 09/01/15 0559  Weight: 61.7 kg (136 lb 0.4 oz) 61.689 kg (136 lb) 61.8 kg (136 lb 3.9 oz)    Exam:  GENERAL: NAD, pleasant, NG tube  in place  HEENT: no scleral icterus, PERRL  NECK: supple  LUNGS: CTA biL, no wheezing  HEART: RRR without MRG  ABDOMEN: soft, non tender. 1 drains present, bag full on the biloma drain. PTC drain capped  EXTREMITIES: no clubbing / cyanosis  NEUROLOGIC: non focal  PSYCHIATRIC: normal mood and affect   Data Reviewed: Basic Metabolic Panel:  Recent Labs Lab 08/28/15 0513 08/29/15 0530 08/30/15 0515 08/31/15 0500 09/01/15 0537  NA 140 144 140 140 137  K 4.2 4.1 3.5 3.4* 3.4*  CL 99* 102 99* 103 98*  CO2 '29 27 27 22 28  ' GLUCOSE 120* 121* 116* 73 98  BUN 19 29* 40* 27* 20  CREATININE 1.17* 1.59* 1.58* 1.03* 0.97  CALCIUM 9.0 9.3 8.9 8.4* 8.2*   Liver Function Tests:  Recent Labs Lab 08/28/15 0513 08/29/15 0530 08/30/15 0515 08/31/15 1424 09/01/15 0537  AST 241* 136* 62* 39 41  ALT 199* 171* 110* 69* 60*  ALKPHOS 412* 353* 254* 200* 183*  BILITOT 2.1* 0.8 1.2 1.2 1.3*  PROT 7.3 7.5 6.7 6.5 6.3*  ALBUMIN 2.8* 3.1* 2.7* 2.7* 2.3*    Recent  Labs Lab 08/26/15 0405 08/27/15 0540 08/28/15 0513  LIPASE 61* 49 46  AMYLASE 113* 112* 99   CBC:  Recent Labs Lab 08/27/15 0540 08/28/15 0513 08/29/15 0530 08/30/15 0515 08/31/15 0500  WBC 10.1 11.3* 9.2 9.8 9.8  NEUTROABS 7.5 8.2* 7.8*  --   --   HGB 12.7 12.9 13.3 12.1 11.6*  HCT 38.7 39.9 39.6 37.7 35.7*  MCV 90.2 91.3 90.0 92.2 91.8  PLT 342 353 340 344 268   BNP (last 3 results)  Recent Labs  05/15/15 1115 06/15/15 0955  BNP 111.4* 100.1*   Recent Results (from the past 240 hour(s))  Culture, blood (routine x 2)     Status: None (Preliminary result)   Collection Time: 08/27/15 12:55 PM  Result Value Ref Range Status   Specimen Description BLOOD LEFT ARM  Final   Special Requests BOTTLES DRAWN AEROBIC AND ANAEROBIC 10CC  Final   Culture   Final    NO GROWTH 4 DAYS Performed at [facility]    Report Status PENDING  Incomplete  Culture, blood (routine x 2)     Status: None  (Preliminary result)   Collection Time: 08/27/15  1:10 PM  Result Value Ref Range Status   Specimen Description BLOOD RIGHT HAND  Final   Special Requests IN PEDIATRIC BOTTLE 2CC  Final   Culture   Final    NO GROWTH 4 DAYS Performed at [facility]    Report Status PENDING  Incomplete  Culture, Urine     Status: None   Collection Time: 08/28/15 12:25 AM  Result Value Ref Range Status   Specimen Description URINE, RANDOM  Final   Special Requests NONE  Final   Culture   Final    20,000 COLONIES/mL YEAST Performed at [facility]    Report Status 08/29/2015 FINAL  Final  Culture, routine-abscess     Status: None   Collection Time: 08/28/15 12:58 PM  Result Value Ref Range Status   Specimen Description DRAINAGE RUQ CT DRAIN  Final   Special Requests NONE  Final   Gram Stain   Final    NO WBC SEEN NO SQUAMOUS EPITHELIAL CELLS SEEN NO ORGANISMS SEEN Performed at [facility]    Culture   Final    NO GROWTH 3 DAYS Performed at [facility]    Report Status 08/31/2015 FINAL  Final     Scheduled Meds: . antiseptic oral rinse  7 mL Mouth Rinse BID  . dorzolamide-timolol  1 drop Left Eye BID  . enoxaparin (LOVENOX) injection  40 mg Subcutaneous Q24H  . indomethacin  100 mg Rectal Once  . lactose free nutrition  237 mL Oral TID WC  . pantoprazole (PROTONIX) IV  40 mg Intravenous Q24H  . piperacillin-tazobactam  3.375 g Intravenous Q8H  . sodium chloride  1,000 mL Intravenous Once  . sodium chloride  3 mL Intravenous Q12H   Continuous Infusions: . dextrose 5 % and 0.45% NaCl 75 mL/hr at 09/01/15 [place], MD [facility] Pager [phone]. If 7 PM - 7 AM, please contact night-coverage at [email or web address], password [facility] 09/01/2015, 12:04 PM  LOS: 12 days

## 2015-09-02 LAB — COMPREHENSIVE METABOLIC PANEL
ALBUMIN: 2.4 g/dL — AB (ref 3.5–5.0)
ALK PHOS: 172 U/L — AB (ref 38–126)
ALT: 59 U/L — ABNORMAL HIGH (ref 14–54)
AST: 50 U/L — AB (ref 15–41)
Anion gap: 11 (ref 5–15)
BILIRUBIN TOTAL: 1 mg/dL (ref 0.3–1.2)
BUN: 17 mg/dL (ref 6–20)
CALCIUM: 7.9 mg/dL — AB (ref 8.9–10.3)
CO2: 25 mmol/L (ref 22–32)
CREATININE: 0.97 mg/dL (ref 0.44–1.00)
Chloride: 98 mmol/L — ABNORMAL LOW (ref 101–111)
GFR calc Af Amer: >60 mL/min (ref >60)
GFR, EST NON AFRICAN AMERICAN: 52 mL/min — AB (ref >60)
GLUCOSE: 102 mg/dL — AB (ref 65–99)
Potassium: 2.9 mmol/L — ABNORMAL LOW (ref 3.5–5.1)
Sodium: 134 mmol/L — ABNORMAL LOW (ref 135–145)
TOTAL PROTEIN: 6.3 g/dL — AB (ref 6.5–8.1)

## 2015-09-02 MED ORDER — DRONABINOL 2.5 MG PO CAPS
2.5000 mg | ORAL_CAPSULE | Freq: Two times a day (BID) | ORAL | Status: DC
Start: 2015-09-02 — End: 2015-09-06
  Administered 2015-09-02 – 2015-09-06 (×8): 2.5 mg via ORAL
  Filled 2015-09-02 (×8): qty 1

## 2015-09-02 MED ORDER — FUROSEMIDE 10 MG/ML IJ SOLN
20.0000 mg | Freq: Once | INTRAMUSCULAR | Status: AC
  Administered 2015-09-02: 20 mg via INTRAVENOUS
  Filled 2015-09-02: qty 2

## 2015-09-02 MED ORDER — POTASSIUM CHLORIDE CRYS ER 20 MEQ PO TBCR
40.0000 meq | EXTENDED_RELEASE_TABLET | ORAL | Status: AC
  Administered 2015-09-02 (×2): 40 meq via ORAL
  Filled 2015-09-02 (×2): qty 2

## 2015-09-02 MED ORDER — FUROSEMIDE 20 MG PO TABS
20.0000 mg | ORAL_TABLET | Freq: Every day | ORAL | Status: DC
  Administered 2015-09-03 – 2015-09-06 (×4): 20 mg via ORAL
  Filled 2015-09-02 (×4): qty 1

## 2015-09-02 NOTE — Progress Notes (Signed)
PROGRESS NOTE  Kim Donovan FGH:[id number] DOB: 1928-09-24 DOA: 08/19/2015 PCP: Mathews Argyle, MD   HPI: 82 yo F with recently diagnosed metastatic renal cell carcinoma (preliminary pathology), additional immunohistochemical histochemical stains most consistent with a poorly differentiated urothelial carcinoma, admitted on 1/2 with nausea and vomiting, found to have biliary and duodenal obstruction.   Subjective / 24 H Interval events - she is doing well with clear liquids, no vomiting - no chest pain, abdominal pain. Sore at the drain site.   Assessment/Plan: Principal Problem:   Sepsis ([place]) Active Problems:   GLAUCOMA   Polymyalgia rheumatica (HCC)   S/P AVR (aortic valve replacement)   Chronic diastolic CHF (congestive heart failure) (HCC)   Renal mass, right   Liver masses   AKI (acute kidney injury) (HCC)   Nausea and vomiting   Hyponatremia   Essential hypertension   Intraabdominal fluid collection   Hepatic metastases (HCC)   CAP (community acquired pneumonia)   Elevated lipase   Transaminitis   Fever, unspecified   Abdominal fluid collection   Metastatic renal cell carcinoma (HCC)   Elevated LFTs   Acquired dilation of bile duct   Gastric outlet obstruction   Abnormal CT scan, stomach   Biliary obstruction   Bile duct rupture   Biliary obstruction due to malignant neoplasm   Duodenal stenosis  Sepsis due to probable aspiration pneumonia - CT scan 1/6 with left lower lobe infiltrate - on Zosyn, started on 1/2, completed a course, stop Zosyn today   Stage IV urothelial carcinoma, with liver mets - Dr. Benay Spice following, appreciate input.  - evaluate for outpatient chemotherapy  Biliary obstruction - CT scan on 1/6 with biliary dilatation, "Stable intrahepatic biliary dilatation with an abrupt cut off of the common bile duct" - elevated Alk phos, LFTs and bilirubin.  - now with 2 drains, one in the RUQ peritoneal fluid collection, also s/p PTC  and placement of a 53F internal/external biliary drain on 1/11 by IR. There appears to be a fistulous connection between the fluid collection and the right posterior intrahepatic biliary ducts. - monitor drain outputs, remove RUQ drain once it stops draining and cap PTC drain prior to d/c  - PTC drain capped since 1/13, LFTs remain stable  Duodenal obstruction  - s/p stent placement 1/13 - advance diet slowly, today try full liquids  RUQ peritoneal fluid collection - s/p drain placement per IR on 1/10, draining bilious fluid - cultures without growth so far, pending - IR following  - still with ongoing drainage, not ready for removal today   AKI on CKD III - suspect pre-renal on top of CKD, it appears that her GFR in 2016 was ~40, prior to that in 2012 within normal limits - renal function stable  Nausea/vomiting - NG tube now out - continue supportive treatment  Hypokalemia - likely due to poor po intake in the past few days, rplete and continue to  monitor  HTN - stable  PMR  S/p bioprosthetic AVR  Chronic diastolic CHF - mild LE edema, now she is eating, will discontinue IVF and resume home Lasix - monitor renal function    Diet: Diet full liquid Room service appropriate?: Yes; Fluid consistency:: Thin Fluids: D5 1/2 NS DVT Prophylaxis: Lovenox  Code Status: DNR Family Communication: d/w sister bedside Disposition Plan: SNF when ready   Barriers to discharge: diet advancement, complex drain monitoring  Consultants:  IR  GI  Oncology   Procedures:  EGD 1/10 ENDOSCOPIC IMPRESSION:  1. Severe stricture in the 2nd part of the duodenum, ERCP not attempted 2. Large volume of food residue in the gastric fundus and gastric body 3. Stricture at the gastroesophageal junction  EGD 1/13 ENDOSCOPIC IMPRESSION: 1. Esophagitis secondary to NG tube trauma in the mid an distal esophagus 2. Nasogastric tube trauma was evident in the cardia and gastric  body 3. Acquired stenosis (malignant external compression) was found at the D1-D2 junction; successful placement of 9 cm duodenal stent across the stricture   Antibiotics Vancomycin 08/20/15>08/23/15 Zosyn 08/20/15>08/24/15, 08/27/15>>1/15 Zithromax 08/22/15>08/24/15 Levaquin 08/24/15> 08/26/2015   Studies  No results found.  Objective  Filed Vitals:   09/01/15 0559 09/01/15 1406 09/01/15 2201 09/02/15 0619  BP: 123/76 110/74 118/80 108/66  Pulse: 88 93 100 89  Temp: 97.7 F (36.5 C) 97.2 F (36.2 C) 98.5 F (36.9 C) 98.2 F (36.8 C)  TempSrc: Oral Oral Oral Oral  Resp: '20 19 20 18  ' Height:      Weight: 61.8 kg (136 lb 3.9 oz)   63.6 kg (140 lb 3.4 oz)  SpO2: 98% 99% 98% 98%    Intake/Output Summary (Last 24 hours) at 09/02/15 1345 Last data filed at 09/02/15 1030  Gross per 24 hour  Intake 2242.5 ml  Output    100 ml  Net 2142.5 ml   Filed Weights   08/31/15 1136 09/01/15 0559 09/02/15 0619  Weight: 61.689 kg (136 lb) 61.8 kg (136 lb 3.9 oz) 63.6 kg (140 lb 3.4 oz)    Exam:  GENERAL: NAD, pleasant  HEENT: no scleral icterus, PERRL  NECK: supple  LUNGS: CTA biL, no wheezing  HEART: RRR without MRG  ABDOMEN: soft, non tender. 1 drains present, bag full on the biloma drain. PTC drain capped  EXTREMITIES: no clubbing / cyanosis  NEUROLOGIC: non focal   Data Reviewed: Basic Metabolic Panel:  Recent Labs Lab 08/29/15 0530 08/30/15 0515 08/31/15 0500 09/01/15 0537 09/02/15 0515  NA 144 140 140 137 134*  K 4.1 3.5 3.4* 3.4* 2.9*  CL 102 99* 103 98* 98*  CO2 '27 27 22 28 25  ' GLUCOSE 121* 116* 73 98 102*  BUN 29* 40* 27* 20 17  CREATININE 1.59* 1.58* 1.03* 0.97 0.97  CALCIUM 9.3 8.9 8.4* 8.2* 7.9*   Liver Function Tests:  Recent Labs Lab 08/29/15 0530 08/30/15 0515 08/31/15 1424 09/01/15 0537 09/02/15 0515  AST 136* 62* 39 41 50*  ALT 171* 110* 69* 60* 59*  ALKPHOS 353* 254* 200* 183* 172*  BILITOT 0.8 1.2 1.2 1.3* 1.0  PROT 7.5 6.7 6.5 6.3* 6.3*   ALBUMIN 3.1* 2.7* 2.7* 2.3* 2.4*    Recent Labs Lab 08/27/15 0540 08/28/15 0513  LIPASE 49 46  AMYLASE 112* 99   CBC:  Recent Labs Lab 08/27/15 0540 08/28/15 0513 08/29/15 0530 08/30/15 0515 08/31/15 0500  WBC 10.1 11.3* 9.2 9.8 9.8  NEUTROABS 7.5 8.2* 7.8*  --   --   HGB 12.7 12.9 13.3 12.1 11.6*  HCT 38.7 39.9 39.6 37.7 35.7*  MCV 90.2 91.3 90.0 92.2 91.8  PLT 342 353 340 344 268   BNP (last 3 results)  Recent Labs  05/15/15 1115 06/15/15 0955  BNP 111.4* 100.1*   Recent Results (from the past 240 hour(s))  Culture, blood (routine x 2)     Status: None   Collection Time: 08/27/15 12:55 PM  Result Value Ref Range Status   Specimen Description BLOOD LEFT ARM  Final   Special Requests BOTTLES DRAWN AEROBIC  AND ANAEROBIC 10CC  Final   Culture   Final    NO GROWTH 5 DAYS Performed at [facility]    Report Status 09/01/2015 FINAL  Final  Culture, blood (routine x 2)     Status: None   Collection Time: 08/27/15  1:10 PM  Result Value Ref Range Status   Specimen Description BLOOD RIGHT HAND  Final   Special Requests IN PEDIATRIC BOTTLE [facility]  Final   Culture   Final    NO GROWTH 5 DAYS Performed at [facility]    Report Status 09/01/2015 FINAL  Final  Culture, Urine     Status: None   Collection Time: 08/28/15 12:25 AM  Result Value Ref Range Status   Specimen Description URINE, RANDOM  Final   Special Requests NONE  Final   Culture   Final    20,000 COLONIES/mL YEAST Performed at [facility]    Report Status 08/29/2015 FINAL  Final  Culture, routine-abscess     Status: None   Collection Time: 08/28/15 12:58 PM  Result Value Ref Range Status   Specimen Description DRAINAGE RUQ CT DRAIN  Final   Special Requests NONE  Final   Gram Stain   Final    NO WBC SEEN NO SQUAMOUS EPITHELIAL CELLS SEEN NO ORGANISMS SEEN Performed at [facility]    Culture   Final    NO GROWTH 3 DAYS Performed at [facility]     Report Status 08/31/2015 FINAL  Final     Scheduled Meds: . antiseptic oral rinse  7 mL Mouth Rinse BID  . dorzolamide-timolol  1 drop Left Eye BID  . enoxaparin (LOVENOX) injection  40 mg Subcutaneous Q24H  . [START ON 09/03/2015] furosemide  20 mg Oral Daily  . indomethacin  100 mg Rectal Once  . lactose free nutrition  237 mL Oral TID WC  . pantoprazole (PROTONIX) IV  40 mg Intravenous Q24H  . piperacillin-tazobactam  3.375 g Intravenous Q8H  . potassium chloride  40 mEq Oral Q3H  . sodium chloride  1,000 mL Intravenous Once  . sodium chloride  3 mL Intravenous Q12H   Continuous Infusions:    Marzetta Board, MD [facility] Pager [phone]. If 7 PM - 7 AM, please contact night-coverage at [email or web address], password [facility] 09/02/2015, 1:45 PM  LOS: 13 days

## 2015-09-02 NOTE — Plan of Care (Addendum)
Comments:  K+ 2.9 this morning.  Replaced PO 36mEq x2.   Non pitting edema to BLE.  Fine crackles noted to bilateral lower lobes.   [place] IVF.  Lasix ordered 20mg  IV once,  20 mg PO daily. Ambulated 200 feet in hall using FWW.  Unsteady.  Standby assist only. Drains flushed as ordered.  RLQ drain with moderate bile colored output.       Problem: Physical Regulation: Goal: Will remain free from infection Continue  Problem: Skin Integrity: Goal: Risk for impaired skin integrity will decrease Outcome: Progressing Patient ambulating with standby assist using FWW.   Up in chair for meals. Advanced diet to full liquids.    Problem: Tissue Perfusion: Goal: Risk factors for ineffective tissue perfusion will decrease Outcome: Progressing .  Problem: Nutrition: Goal: Adequate nutrition will be maintained Outcome: Progressing Advanced to full liquids.   Tolerating well. Marinol scheduled (new).  Problem: Bowel/Gastric: Goal: Will not experience complications related to bowel motility Continue.    Problem: Symptom Management Goal: Occurences of nausea and/or vomiting will decrease Outcome: Progressing No nausea so far this shift. Continue to monitor.   Problem: Activity: Goal: Capacity to carry out activities will improve Outcome: Progressing .  Problem: Cardiac: Goal: Ability to achieve and maintain adequate cardiopulmonary perfusion will improve Outcome: Progressing .

## 2015-09-03 ENCOUNTER — Inpatient Hospital Stay (HOSPITAL_COMMUNITY): Payer: Medicare Other

## 2015-09-03 ENCOUNTER — Encounter (HOSPITAL_COMMUNITY): Payer: Self-pay | Admitting: Internal Medicine

## 2015-09-03 DIAGNOSIS — R748 Abnormal levels of other serum enzymes: Secondary | ICD-10-CM

## 2015-09-03 DIAGNOSIS — R609 Edema, unspecified: Secondary | ICD-10-CM

## 2015-09-03 LAB — CBC
HCT: 33.4 % — ABNORMAL LOW (ref 36.0–46.0)
HEMOGLOBIN: 11.1 g/dL — AB (ref 12.0–15.0)
MCH: 29.7 pg (ref 26.0–34.0)
MCHC: 33.2 g/dL (ref 30.0–36.0)
MCV: 89.3 fL (ref 78.0–100.0)
Platelets: 302 10*3/uL (ref 150–400)
RBC: 3.74 MIL/uL — AB (ref 3.87–5.11)
RDW: 14.4 % (ref 11.5–15.5)
WBC: 7.3 10*3/uL (ref 4.0–10.5)

## 2015-09-03 LAB — PHOSPHORUS: PHOSPHORUS: 1.3 mg/dL — AB (ref 2.5–4.6)

## 2015-09-03 LAB — COMPREHENSIVE METABOLIC PANEL
ALK PHOS: 225 U/L — AB (ref 38–126)
ALT: 73 U/L — ABNORMAL HIGH (ref 14–54)
ANION GAP: 10 (ref 5–15)
AST: 72 U/L — ABNORMAL HIGH (ref 15–41)
Albumin: 2.5 g/dL — ABNORMAL LOW (ref 3.5–5.0)
BUN: 15 mg/dL (ref 6–20)
CALCIUM: 8.2 mg/dL — AB (ref 8.9–10.3)
CHLORIDE: 102 mmol/L (ref 101–111)
CO2: 25 mmol/L (ref 22–32)
Creatinine, Ser: 0.85 mg/dL (ref 0.44–1.00)
GFR calc non Af Amer: >60 mL/min (ref >60)
Glucose, Bld: 78 mg/dL (ref 65–99)
Potassium: 3.5 mmol/L (ref 3.5–5.1)
SODIUM: 137 mmol/L (ref 135–145)
Total Bilirubin: 0.8 mg/dL (ref 0.3–1.2)
Total Protein: 6.6 g/dL (ref 6.5–8.1)

## 2015-09-03 LAB — MAGNESIUM: Magnesium: 1.9 mg/dL (ref 1.7–2.4)

## 2015-09-03 MED ORDER — POTASSIUM & SODIUM PHOSPHATES 280-160-250 MG PO PACK
2.0000 | PACK | Freq: Three times a day (TID) | ORAL | Status: AC
  Administered 2015-09-03 (×2): 2 via ORAL
  Filled 2015-09-03 (×2): qty 2

## 2015-09-03 NOTE — Progress Notes (Signed)
PROGRESS NOTE  Kim Donovan [id number] DOB: 09-Aug-1929 DOA: 08/19/2015 PCP: Mathews Argyle, MD   HPI: 82 yo F with recently diagnosed metastatic renal cell carcinoma (preliminary pathology), additional immunohistochemical histochemical stains most consistent with a poorly differentiated urothelial carcinoma, admitted on 1/2 with nausea and vomiting, found to have biliary and duodenal obstruction.   Subjective / 24 H Interval events - on full liquid diet, tolerating.   Assessment/Plan: Principal Problem:   Sepsis ([place]) Active Problems:   GLAUCOMA   Polymyalgia rheumatica (HCC)   S/P AVR (aortic valve replacement)   Chronic diastolic CHF (congestive heart failure) (HCC)   Renal mass, right   Liver masses   AKI (acute kidney injury) (HCC)   Nausea and vomiting   Hyponatremia   Essential hypertension   Intraabdominal fluid collection   Hepatic metastases (HCC)   CAP (community acquired pneumonia)   Elevated lipase   Transaminitis   Fever, unspecified   Abdominal fluid collection   Metastatic renal cell carcinoma (HCC)   Elevated LFTs   Acquired dilation of bile duct   Gastric outlet obstruction   Abnormal CT scan, stomach   Biliary obstruction   Bile duct rupture   Biliary obstruction due to malignant neoplasm   Duodenal stenosis  Biliary obstruction - CT scan on 1/6 with biliary dilatation, "Stable intrahepatic biliary dilatation with an abrupt cut off of the common bile duct" - elevated Alk phos, LFTs and bilirubin on admission - now with 2 drains, one in the RUQ peritoneal fluid collection, also s/p PTC and placement of a 56F internal/external biliary drain on 1/11 by IR. There appears to be a fistulous connection between the fluid collection and the right posterior intrahepatic biliary ducts. - monitor drain outputs, remove RUQ drain once it stops draining  - PTC drain capped since 1/13, LFTs remain stable however Alk phos with mild increase today. IR  following - RUQ drain still draining, unable to remove the drain at this point  Duodenal obstruction  - s/p stent placement 1/13 - She was started on full liquid diet on 11/15, tolerating well, further advanced to soft diet today  - patient with poor appetite, started Marinol yesterday   Refeeding syndrome  - Closely monitor for refeeding, as her diet is advanced, she is hypophosphatemic this morning, replete and continue to monitor   Sepsis due to probable aspiration pneumonia - CT scan 1/6 with left lower lobe infiltrate - on Zosyn, started on 1/2, completed a course, stopped Zosyn 1/15 - Sepsis physiology resolved   Stage IV urothelial carcinoma, with liver mets - Dr. Benay Spice following, appreciate input. Discussed with him this morning - evaluate for outpatient chemotherapy  RUQ peritoneal fluid collection - s/p drain placement per IR on 1/10, draining bilious fluid - cultures without growth so far, pending - IR following - still with ongoing drainage, Cannot remove currently   AKI on CKD III - suspect pre-renal on top of CKD, it appears that her GFR in 2016 was ~40, prior to that in 2012 within normal limits - renal function stable  Nausea/vomiting - NG tube now out - continue supportive treatment  Hypokalemia - likely due to poor po intake in the past few days, rplete and continue to  monitor  HTN - stable  PMR  S/p bioprosthetic AVR  Chronic diastolic CHF - mild LE edema, now she is eating, will discontinue IVF and resume home Lasix - monitor renal function  - Resume by mouth Lasix today, she received one  IV dose of Lasix yesterday. Renal function is stable.  Lower extremity swelling - Likely in the setting of mild fluid overload, however appears to be asymmetric this morning with right lower extremity more swollen than left lower extremity. - We'll obtain a DVT ultrasound to rule out DVT   Diet: Diet full liquid Room service appropriate?: Yes; Fluid  consistency:: Thin Fluids: D5 1/2 NS DVT Prophylaxis: Lovenox  Code Status: DNR Family Communication: d/w sister bedside Disposition Plan: SNF when ready   Barriers to discharge: diet advancement, complex drain monitoring  Consultants:  IR  GI  Oncology   Procedures:  EGD 1/10 ENDOSCOPIC IMPRESSION: 1. Severe stricture in the 2nd part of the duodenum, ERCP not attempted 2. Large volume of food residue in the gastric fundus and gastric body 3. Stricture at the gastroesophageal junction  EGD 1/13 ENDOSCOPIC IMPRESSION: 1. Esophagitis secondary to NG tube trauma in the mid an distal esophagus 2. Nasogastric tube trauma was evident in the cardia and gastric body 3. Acquired stenosis (malignant external compression) was found at the D1-D2 junction; successful placement of 9 cm duodenal stent across the stricture   Antibiotics Vancomycin 08/20/15>08/23/15 Zosyn 08/20/15>08/24/15, 08/27/15>>1/15 Zithromax 08/22/15>08/24/15 Levaquin 08/24/15> 08/26/2015   Studies  No results found.  Objective  Filed Vitals:   09/02/15 0619 09/02/15 1417 09/02/15 2100 09/03/15 0518  BP: 108/66 121/76 128/83 131/80  Pulse: 89 99 99 102  Temp: 98.2 F (36.8 C) 97.6 F (36.4 C) 98.5 F (36.9 C) 98.2 F (36.8 C)  TempSrc: Oral Axillary Oral Oral  Resp: _0 Height:      Weight: 63.6 kg (140 lb 3.4 oz)   64.32 kg (141 lb 12.8 oz)  SpO2: 98% 100% 99% 99%    Intake/Output Summary (Last 24 hours) at 09/03/15 1245 Last data filed at 09/03/15 1100  Gross per 24 hour  Intake    120 ml  Output    175 ml  Net    -55 ml   Filed Weights   09/01/15 0559 09/02/15 0619 09/03/15 0518  Weight: 61.8 kg (136 lb 3.9 oz) 63.6 kg (140 lb 3.4 oz) 64.32 kg (141 lb 12.8 oz)   Exam:  GENERAL: NAD, pleasant  HEENT: no scleral icterus, PERRL  NECK: supple  LUNGS: CTA biL, no wheezing  HEART: RRR without MRG  ABDOMEN: soft, non tender. 1 drains present, biloma drain bag half full. PTC  drain capped  EXTREMITIES: no clubbing / cyanosis  NEUROLOGIC: non focal   Data Reviewed: Basic Metabolic Panel:  Recent Labs Lab 08/30/15 0515 08/31/15 0500 09/01/15 0537 09/02/15 0515 09/03/15 0454  NA 140 140 137 134* 137  K 3.5 3.4* 3.4* 2.9* 3.5  CL 99* 103 98* 98* 102  CO2 _1 GLUCOSE 116* 73 98 102* 78  BUN 40* 27* _2 CREATININE 1.58* 1.03* 0.97 0.97 0.85  CALCIUM 8.9 8.4* 8.2* 7.9* 8.2*  MG  --   --   --   --  1.9  PHOS  --   --   --   --  1.3*   Liver Function Tests:  Recent Labs Lab 08/30/15 0515 08/31/15 1424 09/01/15 0537 09/02/15 0515 09/03/15 0454  AST 62* 39 41 50* 72*  ALT 110* 69* 60* 59* 73*  ALKPHOS 254* 200* 183* 172* 225*  BILITOT 1.2 1.2 1.3* 1.0 0.8  PROT 6.7 6.5 6.3* 6.3* 6.6  ALBUMIN 2.7* 2.7* 2.3* 2.4* 2.5*    Recent  Labs Lab 08/28/15 0513  LIPASE 46  AMYLASE 99   CBC:  Recent Labs Lab 08/28/15 0513 08/29/15 0530 08/30/15 0515 08/31/15 0500 09/03/15 0454  WBC 11.3* 9.2 9.8 9.8 7.3  NEUTROABS 8.2* 7.8*  --   --   --   HGB 12.9 13.3 12.1 11.6* 11.1*  HCT 39.9 39.6 37.7 35.7* 33.4*  MCV 91.3 90.0 92.2 91.8 89.3  PLT 353 340 344 268 302   BNP (last 3 results)  Recent Labs  05/15/15 1115 06/15/15 0955  BNP 111.4* 100.1*   Recent Results (from the past 240 hour(s))  Culture, blood (routine x 2)     Status: None   Collection Time: 08/27/15 12:55 PM  Result Value Ref Range Status   Specimen Description BLOOD LEFT ARM  Final   Special Requests BOTTLES DRAWN AEROBIC AND ANAEROBIC 10CC  Final   Culture   Final    NO GROWTH 5 DAYS Performed at [facility]    Report Status 09/01/2015 FINAL  Final  Culture, blood (routine x 2)     Status: None   Collection Time: 08/27/15  1:10 PM  Result Value Ref Range Status   Specimen Description BLOOD RIGHT HAND  Final   Special Requests IN PEDIATRIC BOTTLE [facility]  Final   Culture   Final    NO GROWTH 5 DAYS Performed at [facility]    Report  Status 09/01/2015 FINAL  Final  Culture, Urine     Status: None   Collection Time: 08/28/15 12:25 AM  Result Value Ref Range Status   Specimen Description URINE, RANDOM  Final   Special Requests NONE  Final   Culture   Final    20,000 COLONIES/mL YEAST Performed at [facility]    Report Status 08/29/2015 FINAL  Final  Culture, routine-abscess     Status: None   Collection Time: 08/28/15 12:58 PM  Result Value Ref Range Status   Specimen Description DRAINAGE RUQ CT DRAIN  Final   Special Requests NONE  Final   Gram Stain   Final    NO WBC SEEN NO SQUAMOUS EPITHELIAL CELLS SEEN NO ORGANISMS SEEN Performed at [facility]    Culture   Final    NO GROWTH 3 DAYS Performed at [facility]    Report Status 08/31/2015 FINAL  Final     Scheduled Meds: . antiseptic oral rinse  7 mL Mouth Rinse BID  . dorzolamide-timolol  1 drop Left Eye BID  . dronabinol  2.5 mg Oral BID AC  . enoxaparin (LOVENOX) injection  40 mg Subcutaneous Q24H  . furosemide  20 mg Oral Daily  . indomethacin  100 mg Rectal Once  . lactose free nutrition  237 mL Oral TID WC  . pantoprazole (PROTONIX) IV  40 mg Intravenous Q24H  . potassium & sodium phosphates  2 packet Oral TID WC & HS  . sodium chloride  1,000 mL Intravenous Once  . sodium chloride  3 mL Intravenous Q12H   Continuous Infusions:    Marzetta Board, MD [facility] Pager [phone]. If 7 PM - 7 AM, please contact night-coverage at [email or web address], password [facility] 09/03/2015, 12:45 PM  LOS: 14 days

## 2015-09-03 NOTE — Progress Notes (Signed)
Patient ID: Kim Donovan, female   DOB: 18-Apr-1929, 82 y.o.   MRN: [id number]    Referring Physician(s): Sherrill/TRH/Pyrtle  Chief Complaint: Metastatic renal cell cancer, biliary obstruction, biloma   Subjective: Patient doing well; currently denies significant abdominal pain, nausea or vomiting; has tolerated diet ; has ambulated short distances   Allergies: Tramadol  Medications: Prior to Admission medications   Medication Sig Start Date End Date Taking? Authorizing Provider  ALPRAZolam (XANAX) 0.25 MG tablet Take 1 tablet (0.25 mg total) by mouth at bedtime as needed for anxiety. 08/06/15  Yes Ladell Pier, MD  aspirin 81 MG tablet Take 81 mg by mouth daily.     Yes Historical Provider, MD  Calcium Citrate-Vitamin D 250-200 MG-UNIT TABS Take 2 tablets by mouth daily.    Yes Historical Provider, MD  Diphenhydramine-APAP, sleep, (TYLENOL PM EXTRA STRENGTH PO) Take 1 tablet by mouth at bedtime as needed (pain/sleep).    Yes Historical Provider, MD  dorzolamide-timolol (COSOPT) 22.3-6.8 MG/ML ophthalmic solution Place 1 drop into the left eye 2 (two) times daily.    Yes Historical Provider, MD  furosemide (LASIX) 20 MG tablet Take 1 tablet (20 mg total) by mouth daily. 05/15/15  Yes Larey Dresser, MD  Multiple Vitamins-Minerals (CENTRUM SILVER ULTRA WOMENS PO) Take 1 tablet by mouth daily.     Yes Historical Provider, MD  Omega-3 Fatty Acids (FISH OIL) 1000 MG CAPS Take 1 capsule by mouth daily. Reported on 08/06/2015   Yes Historical Provider, MD  pazopanib (VOTRIENT) 200 MG tablet Take 4 tablets (800 mg total) by mouth daily. Take on an empty stomach. 08/14/15  Yes Ladell Pier, MD  PREDNISONE, PAK, PO Take 2.5 mg by mouth every other day.    Yes Historical Provider, MD  ezetimibe (ZETIA) 10 MG tablet Take 1 tablet (10 mg total) by mouth daily. 05/12/14   Jolaine Artist, MD  potassium chloride (K-DUR) 10 MEQ tablet Take 1 tablet (10 mEq total) by mouth daily. Patient not  taking: Reported on 08/14/2015 05/15/15   Larey Dresser, MD     Vital Signs: BP 136/95 mmHg  Pulse 67  Temp(Src) 97.8 F (36.6 C) (Oral)  Resp 18  Ht '5\' 6"'  (1.676 m)  Wt 141 lb 12.8 oz (64.32 kg)  BMI 22.90 kg/m2  SpO2 99%  Physical Exam awake/alert. Right upper quadrant and internal/external biliary drain intact; dressings clean and dry, insertion sites with minimal tenderness. Right upper quadrant drain output 100 mL bile; cultures negative to date. Total bilirubin normal, creatinine normal; internal/external biliary drain flushed with sterile normal saline without difficulty  Imaging: Dg Abd 1 View - Kub  08/31/2015  CLINICAL DATA:  Gastric obstruction. Biliary ductal dilatation. Abscess. Right renal neoplasm. EXAM: DG C-ARM 61-120 MIN; ABDOMEN - 1 VIEW COMPARISON:  None. FINDINGS: Two cross-table lateral fluoroscopic spot images show placement of an internal wall stent in the expected region of the duodenum. Two percutaneous pigtail catheters are also seen in this region. IMPRESSION: Internal wall stent seen expected region of duodenum. Percutaneous pigtail catheter is also seen in this region. Electronically Signed   By: Earle Gell M.D.   On: 08/31/2015 13:59   Dg C-arm 1-60 Min  08/31/2015  CLINICAL DATA:  Gastric obstruction. Biliary ductal dilatation. Abscess. Right renal neoplasm. EXAM: DG C-ARM 61-120 MIN; ABDOMEN - 1 VIEW COMPARISON:  None. FINDINGS: Two cross-table lateral fluoroscopic spot images show placement of an internal wall stent in the expected region of the duodenum.  Two percutaneous pigtail catheters are also seen in this region. IMPRESSION: Internal wall stent seen expected region of duodenum. Percutaneous pigtail catheter is also seen in this region. Electronically Signed   By: Earle Gell M.D.   On: 08/31/2015 13:59    Labs:  CBC:  Recent Labs  08/29/15 0530 08/30/15 0515 08/31/15 0500 09/03/15 0454  WBC 9.2 9.8 9.8 7.3  HGB 13.3 12.1 11.6* 11.1*  HCT  39.6 37.7 35.7* 33.4*  PLT 340 344 268 302    COAGS:  Recent Labs  08/10/15 0715 08/20/15 0510 08/29/15 1050  INR 0.99 1.24 1.20  APTT  --  38*  --     BMP:  Recent Labs  08/31/15 0500 09/01/15 0537 09/02/15 0515 09/03/15 0454  NA 140 137 134* 137  K 3.4* 3.4* 2.9* 3.5  CL 103 98* 98* 102  CO2 '22 28 25 25  ' GLUCOSE 73 98 102* 78  BUN 27* '20 17 15  ' CALCIUM 8.4* 8.2* 7.9* 8.2*  CREATININE 1.03* 0.97 0.97 0.85  GFRNONAA 49* 52* 52* >60  GFRAA 56* >60 >60 >60    LIVER FUNCTION TESTS:  Recent Labs  08/31/15 1424 09/01/15 0537 09/02/15 0515 09/03/15 0454  BILITOT 1.2 1.3* 1.0 0.8  AST 39 41 50* 72*  ALT 69* 60* 59* 73*  ALKPHOS 200* 183* 172* 225*  PROT 6.5 6.3* 6.3* 6.6  ALBUMIN 2.7* 2.3* 2.4* 2.5*    Assessment and Plan: Pt with hx met RCC with biliary obst/biloma, s/p RUQ biloma drainage 1/10, PTC with I/E biliary drain, RUQ drain injection 1/11; injection of RUQ drain shows fistula to rt post intrahepatic biliary ducts c/w bile leak; PTC shows severe intra and extrahepatic biliary ductal dilatation with high-grade stenosis bordering on occlusion at the juncture of the common hepatic and common bile ducts. The distal common bile duct and ampulla were patent; pt s/p duodenal stent on 1/13; afebrile, total bilirubin/creatinine/WBC normal; hgb stable, fluid cultures negative to date. Recommend continuation of current treatment; once output of right upper quadrant drain has ceased will remove drain. As long as patient is tolerating capping of biliary drain will continue current coarse and set up for biliary stent in approximately 3 weeks.   Electronically Signed: D. Rowe Robert 09/03/2015, 2:17 PM   I spent a total of 15 minutes at the the patient's bedside AND on the patient's hospital floor or unit, greater than 50% of which was counseling/coordinating care for right upper quadrant abdominal drain/internal/external biliary drain

## 2015-09-03 NOTE — Progress Notes (Addendum)
Physical Therapy Treatment Patient Details Name: Kim Donovan MRN: [id number] DOB: 06-03-1929 Today's Date: 09/03/2015    History of Present Illness Kim Donovan is a 82 y.o. female with a history of HTN. PMR, Hyperlipidemia, and Glaucoma, with recent biopsy of a Large Right Renal Mass and a Hepatic lesion on 08/10/2015 who presents to the ED with complaints of Nausea and Vomiting and Fever     PT Comments    Progressing slowly with mobility. Pt appears slightly weaker on today. Fatigued and mildly dyspneic after walk. Continue to recommend SNF.   Follow Up Recommendations  SNF     Equipment Recommendations  Rolling walker with 5" wheels    Recommendations for Other Services       Precautions / Restrictions Precautions Precautions: Fall Precaution Comments: monitor HR. Drain R side abdomen Restrictions Weight Bearing Restrictions: No    Mobility  Bed Mobility Overal bed mobility: Needs Assistance Bed Mobility: Supine to Sit;Sit to Supine     Supine to sit: Min guard;HOB elevated Sit to supine: Min assist   General bed mobility comments: Assist for LEs back onto bed. Increased time. Moderate reliance on bedrail  Transfers Overall transfer level: Needs assistance Equipment used: Rolling walker (2 wheeled) Transfers: Sit to/from Stand Sit to Stand: Min assist         General transfer comment: assist to rise, stabilize, control descent.   Ambulation/Gait Ambulation/Gait assistance: Min assist Ambulation Distance (Feet): 165 Feet Assistive device: Rolling walker (2 wheeled) Gait Pattern/deviations: Step-through pattern;Decreased stride length     General Gait Details: assist to stabilize intermittently. Pt tolerated activity fairly well. Dyspnea 2/4. Fatigued fairly easily on today   Financial trader [employer] (Stroke Patients Only)       Balance                                    Cognition  Arousal/Alertness: Awake/alert Behavior During Therapy: WFL for tasks assessed/performed Overall Cognitive Status: Within Functional Limits for tasks assessed                      Exercises General Exercises - Lower Extremity Ankle Circles/Pumps: AROM;Both;Seated;15 reps Quad Sets: AROM;Both;10 reps;Supine Long Arc Quad: AROM;Both;15 reps;Seated    General Comments        Pertinent Vitals/Pain Pain Assessment: 0-10 Pain Score: 4  Pain Location: abdomen Pain Intervention(s): Monitored during session;Limited activity within patient's tolerance;Repositioned    Home Living                      Prior Function            PT Goals (current goals can now be found in the care plan section) Progress towards PT goals: Progressing toward goals    Frequency  Min 3X/week    PT Plan Current plan remains appropriate    Co-evaluation             End of Session   Activity Tolerance: Patient tolerated treatment well Patient left: in bed;with call bell/phone within reach     Time: [id number] PT Time Calculation (min) (ACUTE ONLY): 21 min  Charges:  $Gait Training: 8-22 mins                    G Codes:  Weston Anna, MPT Pager: [phone]

## 2015-09-03 NOTE — Progress Notes (Signed)
*  Preliminary Results* Bilateral lower extremity venous duplex completed. Study was technically limited due to edema. Visualized veins of bilateral lower extremities are negative for deep vein thrombosis. There is no evidence of Baker's cyst bilaterally.  09/03/2015  Maudry Mayhew, RVT, RDCS, RDMS

## 2015-09-04 ENCOUNTER — Inpatient Hospital Stay (HOSPITAL_COMMUNITY): Payer: Medicare Other

## 2015-09-04 DIAGNOSIS — R740 Nonspecific elevation of levels of transaminase and lactic acid dehydrogenase [LDH]: Secondary | ICD-10-CM

## 2015-09-04 LAB — CBC
HCT: 34.3 % — ABNORMAL LOW (ref 36.0–46.0)
HEMOGLOBIN: 11.3 g/dL — AB (ref 12.0–15.0)
MCH: 29.6 pg (ref 26.0–34.0)
MCHC: 32.9 g/dL (ref 30.0–36.0)
MCV: 89.8 fL (ref 78.0–100.0)
Platelets: 317 10*3/uL (ref 150–400)
RBC: 3.82 MIL/uL — AB (ref 3.87–5.11)
RDW: 14.7 % (ref 11.5–15.5)
WBC: 8.5 10*3/uL (ref 4.0–10.5)

## 2015-09-04 LAB — COMPREHENSIVE METABOLIC PANEL
ALT: 64 U/L — ABNORMAL HIGH (ref 14–54)
ANION GAP: 9 (ref 5–15)
AST: 48 U/L — ABNORMAL HIGH (ref 15–41)
Albumin: 2.5 g/dL — ABNORMAL LOW (ref 3.5–5.0)
Alkaline Phosphatase: 216 U/L — ABNORMAL HIGH (ref 38–126)
BUN: 13 mg/dL (ref 6–20)
CHLORIDE: 101 mmol/L (ref 101–111)
CO2: 26 mmol/L (ref 22–32)
CREATININE: 0.86 mg/dL (ref 0.44–1.00)
Calcium: 8.6 mg/dL — ABNORMAL LOW (ref 8.9–10.3)
Glucose, Bld: 82 mg/dL (ref 65–99)
POTASSIUM: 3.7 mmol/L (ref 3.5–5.1)
Sodium: 136 mmol/L (ref 135–145)
Total Bilirubin: 0.7 mg/dL (ref 0.3–1.2)
Total Protein: 6.7 g/dL (ref 6.5–8.1)

## 2015-09-04 LAB — PHOSPHORUS: PHOSPHORUS: 1.8 mg/dL — AB (ref 2.5–4.6)

## 2015-09-04 LAB — MAGNESIUM: MAGNESIUM: 1.9 mg/dL (ref 1.7–2.4)

## 2015-09-04 MED ORDER — PANTOPRAZOLE SODIUM 40 MG PO TBEC
40.0000 mg | DELAYED_RELEASE_TABLET | Freq: Every day | ORAL | Status: DC
  Administered 2015-09-05 – 2015-09-06 (×2): 40 mg via ORAL
  Filled 2015-09-04 (×2): qty 1

## 2015-09-04 MED ORDER — ENSURE ENLIVE PO LIQD
237.0000 mL | Freq: Two times a day (BID) | ORAL | Status: DC
  Administered 2015-09-05 – 2015-09-06 (×2): 237 mL via ORAL

## 2015-09-04 MED ORDER — POTASSIUM & SODIUM PHOSPHATES 280-160-250 MG PO PACK
2.0000 | PACK | Freq: Three times a day (TID) | ORAL | Status: AC
  Administered 2015-09-04 (×4): 2 via ORAL
  Filled 2015-09-04 (×4): qty 2

## 2015-09-04 NOTE — Progress Notes (Signed)
PHARMACIST - PHYSICIAN COMMUNICATION  Key Points: Use following P&T approved IV to PO antibiotic change policy. Description contains the criteria that are approved  Note: Policy Excludes:  Esophagectomy patients  DR:  TR[facility] CONCERNING: IV to Oral Route Change Policy  RECOMMENDATION: This patient is receiving pantoprazole by the intravenous route.  Based on criteria approved by the Pharmacy and Therapeutics Committee, the intravenous medication(s) is/are being converted to the equivalent oral dose form(s).   DESCRIPTION: These criteria include:  The patient is eating (either orally or via tube) and/or has been taking other orally administered medications for a least 24 hours  The patient has no evidence of active gastrointestinal bleeding or impaired GI absorption (gastrectomy, short bowel, patient on TNA or NPO).  If you have questions about this conversion, please contact the Pharmacy Department  []   [phone] )  [facility] []   [phone] )  [facility] []   [phone] )  [facility] []   [phone] )  [facility] [x]   [phone] )  [place], [place], [facility] 09/04/2015 10:33 AM

## 2015-09-04 NOTE — Progress Notes (Signed)
CSW following for return to [facility] when medically ready. CSW has completed FL2 & will continue to follow and assist with return. Anticipating discharge in 2-3 days per MD note.   Raynaldo Opitz, [facility] Clinical Social Worker cell #: [phone]

## 2015-09-04 NOTE — Progress Notes (Signed)
Nutrition Follow-up  DOCUMENTATION CODES:   Severe malnutrition in context of chronic illness  INTERVENTION:  - Will d/c Boost Plus - Will order Ensure Enlive BID, each supplement provides 350 kcal and 20 grams of protein - Encourage PO intakes of meals and supplements and for pt to consume items slowly - RD will continue to monitor for needs  NUTRITION DIAGNOSIS:   Malnutrition related to catabolic illness, cancer and cancer related treatments as evidenced by percent weight loss, severe depletion of muscle mass, severe depletion of body fat. -ongoing  GOAL:   Patient will meet greater than or equal to 90% of their needs -unmet on average  MONITOR:   PO intake, Supplement acceptance, Diet advancement, Weight trends, Labs, I & O's  ASSESSMENT:   Kim Donovan is a 82 y.o. female with a history of HTN. PMR, Hyperlipidemia, and Glaucoma, with recent biopsy of a Large Right Renal Mass and a Hepatic lesion on 08/10/2015 who presents to the ED with complaints of Nausea and Vomiting and Fever and Chills for the past 2 days. Her temperature was 101 at home. She has had decreased appetite and poor intake of foods and liquids. She has had some ABD pain. A CT scan of the ABD/Pelvis was performed and revealed a Large Right Kidney Mass And Multiple Metastatic Lesions in the Liver which were known, and a fluid collection extending from the antrum of the stomach to the Hepatic Flexure measuring 7.0 x 5.7 x 5.2 cm In the ED, a Sepsis workup was initiated and she was placed on IV Vancomycin and Zosyn  1/17 Per chart review, diet advancement/changes as follows:  1/10 @ 1333: NPO 1/12 @ 0800: Regular 1/12 @ 1818: NPO 1/13 @ 1324: CLD 1/15 @ 1026: FLD 1/16 @ 1251: Soft 1/17 @ 1111: FLD  Per chart review, pt ate 50% of lunch yesterday with no other intakes documented since last assessment. Pt eating lunch at time of RD visit. She states that it was recommended that she consume liquids very  slowly and that she has been adhering to this recommendation. She states she drank 1 bottle of Boost over 3 hour time span today and had no associated N/V. She states that last night she consumed tomato soup and after this she began to feel very nauseated.  Talked with pt about Ensure Enlive versus Boost Plus and the increased protein in Ensure; pt is interested in switching at this time. Talked with her about continuing to sip items slowly and to do the best she can throughout the day.   Not meeting needs. Medications reviewed. Labs reviewed; Ca: 8.6 mg/dL, Phos: 1.8 mg/dL, LFTs elevated.   1/10 - Per chart review, pt consumed 100% of lunch and dinner on Regular diet on 1/7 and no intakes recorded for 1/8. Pt has been NPO since 1/8 at 1609 and NGT to suction now in place with ~700cc drainage present at this time.   Per radiology note yesterday (1/9) at 1442: pt recently dx with renal cell carcinoma and she had a liver biopsy 08/10/15 with recent CT findings of "vague fluid collection tracking along the antrum of the stomach and hepatic flexure of colon suspicious for abscess."   Per GI note this AM at 1037, plan for intra-abdominal fluid collection today at noon. GI recommending ERCP for metal stent placement. Plan to perform EGD before ERCP to rule out gastric outlet obstruction or tumor involvement.   Pt unable to meet needs while NPO. Will continue to monitor  POC, ability to advance diet, and associated needs.   1/3 - Spoke with pt at bedside. - Pt reports poor appetite during recent illness, prior to which she stated she was eating "fairly well."  - Admits that recently she was "afraid to eat" because of vomiting. - Pt reports weight loss in the past couple weeks though she is unsure of how much.  - Per chart, pt has had a 16#/10.5% severe wt loss in 3 months. - Nutrition-Focused physical exam completed.  - Findings are severe fat depletion, severe muscle depletion, and moderate edema.   - She reports some nausea still, no longer vomiting, but wants to slowly progress to solid food. Current diet order is full liquids.  Diet Order:  Diet full liquid Room service appropriate?: Yes; Fluid consistency:: Thin  Skin:  Reviewed, no issues  Last BM:  1/16  Height:   Ht Readings from Last 1 Encounters:  08/31/15 5\' 6"  (1.676 m)    Weight:   Wt Readings from Last 1 Encounters:  09/04/15 140 lb 6.9 oz (63.7 kg)    Ideal Body Weight:  59.09 kg  BMI:  Body mass index is 22.68 kg/(m^2).  Estimated Nutritional Needs:   Kcal:  1800-2100 calories  Protein:  60-75 grams  Fluid:  >/= 1.8L  EDUCATION NEEDS:   No education needs identified at this time     Jarome Matin, RD, LDN Inpatient Clinical Dietitian Pager # [phone] After hours/weekend pager # [phone]

## 2015-09-04 NOTE — Progress Notes (Addendum)
Resumed care of patient. Agree with previous assessment. Orders reviewed. Will continue to monitor. 

## 2015-09-04 NOTE — Progress Notes (Signed)
PROGRESS NOTE  Kim Donovan BOF:[id number] DOB: [birth year] DOA: 08/19/2015 PCP: Mathews Argyle, MD   HPI: 82 yo F with recently diagnosed metastatic renal cell carcinoma (preliminary pathology), additional immunohistochemical histochemical stains most consistent with a poorly differentiated urothelial carcinoma, admitted on 1/2 with nausea and vomiting, found to have biliary and duodenal obstruction.   Interval events - Over the last 7 days, patient has been stable, she was initially nothing by mouth due to gastric outlet obstruction, and interventional radiology have placed 2 percutaneous drains, one in the biloma as well as an additional 1 which is internal/external into the biliary system. It appears that there is a fistulous communication between the biloma and the biliary system, likely due to malignancy. Gastroenterology was able to do an EGD on Friday and place a duodenal stent. Following the biliary drainage as well as a duodenal stent placement, patient's was able to tolerate gradual advancement of her diet, NG tube was able to be removed, she was initially placed on liquid diet which was later advanced to full and now soft. Overall patient is improving, however on the evening of 1/16 she had one episode of emesis. She has been found to have evidence of refeeding syndrome with hypophosphatemia requiring supplementation. Pending diet tolerance and correction of her hypophosphatemia, potential discharge in 2-3 days.  Subjective / 24 H  - An episode of emesis last night, no nausea this morning - Denies any chest pain, denies shortness of breath, denies any palpitations  Assessment/Plan: Principal Problem:   Sepsis ([place]) Active Problems:   GLAUCOMA   Polymyalgia rheumatica (HCC)   S/P AVR (aortic valve replacement)   Chronic diastolic CHF (congestive heart failure) (HCC)   Renal mass, right   Liver masses   AKI (acute kidney injury) (HCC)   Nausea and vomiting    Hyponatremia   Essential hypertension   Intraabdominal fluid collection   Hepatic metastases (HCC)   CAP (community acquired pneumonia)   Elevated lipase   Transaminitis   Fever, unspecified   Abdominal fluid collection   Metastatic renal cell carcinoma (HCC)   Elevated LFTs   Acquired dilation of bile duct   Gastric outlet obstruction   Abnormal CT scan, stomach   Biliary obstruction   Bile duct rupture   Biliary obstruction due to malignant neoplasm   Duodenal stenosis  Nausea/vomiting - NG tube now out after the duodenal stent was placed - Initially she had no vomiting episodes over the weekend, however 1/16 showed recurrence of her vomiting. - We'll de-escalate her diet back to full liquids today, obtain a plain abdominal film to rule out ileus  Biliary obstruction - CT scan on 1/6 with biliary dilatation, "Stable intrahepatic biliary dilatation with an abrupt cut off of the common bile duct" - elevated Alk phos, LFTs and bilirubin on admission - now with 2 drains, one in the RUQ peritoneal fluid collection, also s/p PTC and placement of a 36F internal/external biliary drain on 1/11 by IR. There appears to be a fistulous connection between the fluid collection and the right posterior intrahepatic biliary ducts. - monitor drain outputs, remove RUQ drain once it stops draining - PTC drain capped since 1/13, LFTs remain stable however Alk phos with mild increase today. IR following - RUQ drain still draining, unable to remove the drain at this point - Liver enzymes remain stable  Duodenal obstruction  - s/p stent placement 1/13 - She was started on full liquid diet on 11/15, tolerating well, further advanced to  soft diet today  - patient with poor appetite, started Marinol over the weekend - With recurrence of her nausea and 1/16, abdominal film as above  Refeeding syndrome  - Closely monitor for refeeding, as her diet is advanced - She is again hypophosphatemic this morning  requiring repletion, continue to monitor  Sepsis due to probable aspiration pneumonia - CT scan 1/6 with left lower lobe infiltrate - on Zosyn, started on 1/2, completed a course, stopped Zosyn 1/15 - Sepsis physiology resolved   Stage IV urothelial carcinoma, with liver mets - Dr. Benay Spice following, appreciate input. - evaluate for outpatient chemotherapy - Discussed with Dr. Benay Spice today  RUQ peritoneal fluid collection - s/p drain placement per IR on 1/10, draining bilious fluid - cultures without growth so far, pending - IR following - still with ongoing drainage, Cannot remove currently   AKI on CKD III - suspect pre-renal on top of CKD, it appears that her GFR in 2016 was ~40, prior to that in 2012 within normal limits - renal function stable  Hypokalemia - likely due to poor po intake in the past few days, rplete and continue to  monitor  HTN - stable  PMR  S/p bioprosthetic AVR  Chronic diastolic CHF - mild LE edema, now she is eating, will discontinue IVF and resume home Lasix - monitor renal function  - Resume by mouth Lasix , she received one IV dose of Lasix on Saturday. Renal function is stable.  Lower extremity swelling - Likely in the setting of mild fluid overload, however appears to be asymmetric this morning with right lower extremity more swollen than left lower extremity. - DVT ultrasound was negative   Diet: DIET SOFT Room service appropriate?: Yes; Fluid consistency:: Thin Fluids: D5 1/2 NS DVT Prophylaxis: Lovenox  Code Status: DNR Family Communication: d/w niece [name] over the phone Disposition Plan: SNF when ready   Barriers to discharge: diet advancement, complex drain monitoring, refeeding syndrome  Consultants:  IR  GI  Oncology   Procedures:  EGD 1/10 ENDOSCOPIC IMPRESSION: 1. Severe stricture in the 2nd part of the duodenum, ERCP not attempted 2. Large volume of food residue in the gastric fundus and gastric  body 3. Stricture at the gastroesophageal junction  EGD 1/13 ENDOSCOPIC IMPRESSION: 1. Esophagitis secondary to NG tube trauma in the mid an distal esophagus 2. Nasogastric tube trauma was evident in the cardia and gastric body 3. Acquired stenosis (malignant external compression) was found at the D1-D2 junction; successful placement of 9 cm duodenal stent across the stricture   Antibiotics Vancomycin 08/20/15>08/23/15 Zosyn 08/20/15>08/24/15, 08/27/15>>1/15 Zithromax 08/22/15>08/24/15 Levaquin 08/24/15> 08/26/2015   Studies  No results found.  Objective  Filed Vitals:   09/03/15 0518 09/03/15 1329 09/03/15 2052 09/04/15 0521  BP: 131/80 136/95 133/78 135/79  Pulse: 102 67 107 98  Temp: 98.2 F (36.8 C) 97.8 F (36.6 C) 98.2 F (36.8 C) 98 F (36.7 C)  TempSrc: Oral Oral Oral Oral  Resp: '20 18 16 16  ' Height:      Weight: 64.32 kg (141 lb 12.8 oz)   63.7 kg (140 lb 6.9 oz)  SpO2: 99% 99% 99% 98%    Intake/Output Summary (Last 24 hours) at 09/04/15 1108 Last data filed at 09/03/15 1330  Gross per 24 hour  Intake      0 ml  Output    100 ml  Net   -100 ml   Filed Weights   09/02/15 0619 09/03/15 0518 09/04/15 0521  Weight:  63.6 kg (140 lb 3.4 oz) 64.32 kg (141 lb 12.8 oz) 63.7 kg (140 lb 6.9 oz)   Exam:  GENERAL: NAD, pleasant  HEENT: no scleral icterus, PERRL  NECK: supple  LUNGS: CTA biL, no wheezing  HEART: RRR without MRG  ABDOMEN: soft, non tender. 1 drains present, biloma drain bag half full. PTC drain capped  EXTREMITIES: no clubbing / cyanosis  NEUROLOGIC: non focal   Data Reviewed: Basic Metabolic Panel:  Recent Labs Lab 08/31/15 0500 09/01/15 0537 09/02/15 0515 09/03/15 0454 09/04/15 0526  NA 140 137 134* 137 136  K 3.4* 3.4* 2.9* 3.5 3.7  CL 103 98* 98* 102 101  CO2 '22 28 25 25 26  ' GLUCOSE 73 98 102* 78 82  BUN 27* '20 17 15 13  ' CREATININE 1.03* 0.97 0.97 0.85 0.86  CALCIUM 8.4* 8.2* 7.9* 8.2* 8.6*  MG  --   --   --  1.9 1.9  PHOS   --   --   --  1.3* 1.8*   Liver Function Tests:  Recent Labs Lab 08/31/15 1424 09/01/15 0537 09/02/15 0515 09/03/15 0454 09/04/15 0526  AST 39 41 50* 72* 48*  ALT 69* 60* 59* 73* 64*  ALKPHOS 200* 183* 172* 225* 216*  BILITOT 1.2 1.3* 1.0 0.8 0.7  PROT 6.5 6.3* 6.3* 6.6 6.7  ALBUMIN 2.7* 2.3* 2.4* 2.5* 2.5*   CBC:  Recent Labs Lab 08/29/15 0530 08/30/15 0515 08/31/15 0500 09/03/15 0454 09/04/15 0526  WBC 9.2 9.8 9.8 7.3 8.5  NEUTROABS 7.8*  --   --   --   --   HGB 13.3 12.1 11.6* 11.1* 11.3*  HCT 39.6 37.7 35.7* 33.4* 34.3*  MCV 90.0 92.2 91.8 89.3 89.8  PLT 340 344 268 302 317   BNP (last 3 results)  Recent Labs  05/15/15 1115 06/15/15 0955  BNP 111.4* 100.1*   Recent Results (from the past 240 hour(s))  Culture, blood (routine x 2)     Status: None   Collection Time: 08/27/15 12:55 PM  Result Value Ref Range Status   Specimen Description BLOOD LEFT ARM  Final   Special Requests BOTTLES DRAWN AEROBIC AND ANAEROBIC 10CC  Final   Culture   Final    NO GROWTH 5 DAYS Performed at [facility]    Report Status 09/01/2015 FINAL  Final  Culture, blood (routine x 2)     Status: None   Collection Time: 08/27/15  1:10 PM  Result Value Ref Range Status   Specimen Description BLOOD RIGHT HAND  Final   Special Requests IN PEDIATRIC BOTTLE [facility]  Final   Culture   Final    NO GROWTH 5 DAYS Performed at [facility]    Report Status 09/01/2015 FINAL  Final  Culture, Urine     Status: None   Collection Time: 08/28/15 12:25 AM  Result Value Ref Range Status   Specimen Description URINE, RANDOM  Final   Special Requests NONE  Final   Culture   Final    20,000 COLONIES/mL YEAST Performed at [facility]    Report Status 08/29/2015 FINAL  Final  Culture, routine-abscess     Status: None   Collection Time: 08/28/15 12:58 PM  Result Value Ref Range Status   Specimen Description DRAINAGE RUQ CT DRAIN  Final   Special Requests NONE  Final    Gram Stain   Final    NO WBC SEEN NO SQUAMOUS EPITHELIAL CELLS SEEN NO ORGANISMS SEEN Performed  at [facility]   Final    NO GROWTH 3 DAYS Performed at [facility]    Report Status 08/31/2015 FINAL  Final     Scheduled Meds: . antiseptic oral rinse  7 mL Mouth Rinse BID  . dorzolamide-timolol  1 drop Left Eye BID  . dronabinol  2.5 mg Oral BID AC  . enoxaparin (LOVENOX) injection  40 mg Subcutaneous Q24H  . furosemide  20 mg Oral Daily  . indomethacin  100 mg Rectal Once  . lactose free nutrition  237 mL Oral TID WC  . [START ON 09/05/2015] pantoprazole  40 mg Oral Daily  . potassium & sodium phosphates  2 packet Oral TID AC & HS  . sodium chloride  1,000 mL Intravenous Once  . sodium chloride  3 mL Intravenous Q12H   Continuous Infusions:    Marzetta Board, MD [facility] Pager [phone]. If 7 PM - 7 AM, please contact night-coverage at [email or web address], password [email or web address] 09/04/2015, 11:08 AM  LOS: 15 days

## 2015-09-05 ENCOUNTER — Encounter (HOSPITAL_COMMUNITY): Payer: Self-pay

## 2015-09-05 DIAGNOSIS — R198 Other specified symptoms and signs involving the digestive system and abdomen: Secondary | ICD-10-CM

## 2015-09-05 LAB — COMPREHENSIVE METABOLIC PANEL
ALBUMIN: 2.3 g/dL — AB (ref 3.5–5.0)
ALK PHOS: 200 U/L — AB (ref 38–126)
ALT: 53 U/L (ref 14–54)
ANION GAP: 10 (ref 5–15)
AST: 43 U/L — AB (ref 15–41)
BILIRUBIN TOTAL: 0.7 mg/dL (ref 0.3–1.2)
BUN: 14 mg/dL (ref 6–20)
CALCIUM: 8.4 mg/dL — AB (ref 8.9–10.3)
CO2: 26 mmol/L (ref 22–32)
Chloride: 98 mmol/L — ABNORMAL LOW (ref 101–111)
Creatinine, Ser: 0.86 mg/dL (ref 0.44–1.00)
GFR calc Af Amer: >60 mL/min (ref >60)
GFR calc non Af Amer: >60 mL/min (ref >60)
GLUCOSE: 61 mg/dL — AB (ref 65–99)
Potassium: 4.3 mmol/L (ref 3.5–5.1)
Sodium: 134 mmol/L — ABNORMAL LOW (ref 135–145)
TOTAL PROTEIN: 6.5 g/dL (ref 6.5–8.1)

## 2015-09-05 LAB — MAGNESIUM: Magnesium: 1.8 mg/dL (ref 1.7–2.4)

## 2015-09-05 LAB — PHOSPHORUS: Phosphorus: 3.2 mg/dL (ref 2.5–4.6)

## 2015-09-05 NOTE — Care Management Important Message (Signed)
Important Message  Patient Details  Name: RUSTINA DOHRMAN MRN: [id number] Date of Birth: November 25, 1928   Medicare Important Message Given:  Yes    [name] 09/05/2015, 10:41 AMImportant Message  Patient Details  Name: SHEVON VARLEY MRN: [id number] Date of Birth: Sep 28, 1928   Medicare Important Message Given:  Yes    [name] 09/05/2015, 10:41 AM

## 2015-09-05 NOTE — Progress Notes (Signed)
Patient ID: Kim Donovan, female   DOB: October 15, 1928, 82 y.o.   MRN: [id number]    Referring Physician(s): TR[facility]  Chief Complaint: Metastatic renal cell cancer, biliary obstruction, biloma    Subjective:  Pt currently doing ok; denies worsening abd pain,N/V  Allergies: Tramadol  Medications: Prior to Admission medications   Medication Sig Start Date End Date Taking? Authorizing Provider  ALPRAZolam (XANAX) 0.25 MG tablet Take 1 tablet (0.25 mg total) by mouth at bedtime as needed for anxiety. 08/06/15  Yes Ladell Pier, MD  aspirin 81 MG tablet Take 81 mg by mouth daily.     Yes Historical Provider, MD  Calcium Citrate-Vitamin D 250-200 MG-UNIT TABS Take 2 tablets by mouth daily.    Yes Historical Provider, MD  Diphenhydramine-APAP, sleep, (TYLENOL PM EXTRA STRENGTH PO) Take 1 tablet by mouth at bedtime as needed (pain/sleep).    Yes Historical Provider, MD  dorzolamide-timolol (COSOPT) 22.3-6.8 MG/ML ophthalmic solution Place 1 drop into the left eye 2 (two) times daily.    Yes Historical Provider, MD  furosemide (LASIX) 20 MG tablet Take 1 tablet (20 mg total) by mouth daily. 05/15/15  Yes Larey Dresser, MD  Multiple Vitamins-Minerals (CENTRUM SILVER ULTRA WOMENS PO) Take 1 tablet by mouth daily.     Yes Historical Provider, MD  Omega-3 Fatty Acids (FISH OIL) 1000 MG CAPS Take 1 capsule by mouth daily. Reported on 08/06/2015   Yes Historical Provider, MD  pazopanib (VOTRIENT) 200 MG tablet Take 4 tablets (800 mg total) by mouth daily. Take on an empty stomach. 08/14/15  Yes Ladell Pier, MD  PREDNISONE, PAK, PO Take 2.5 mg by mouth every other day.    Yes Historical Provider, MD  ezetimibe (ZETIA) 10 MG tablet Take 1 tablet (10 mg total) by mouth daily. 05/12/14   Jolaine Artist, MD  potassium chloride (K-DUR) 10 MEQ tablet Take 1 tablet (10 mEq total) by mouth daily. Patient not taking: Reported on 08/14/2015 05/15/15   Larey Dresser, MD     Vital Signs: BP 124/80  mmHg  Pulse 102  Temp(Src) 98.1 F (36.7 C) (Oral)  Resp 20  Ht '5\' 6"'  (1.676 m)  Wt 138 lb 14.2 oz (63 kg)  BMI 22.43 kg/m2  SpO2 98%  Physical Exam biliary and biloma drains intact, dressings dry, mildly tender, biliary drain capped, biloma drain with about 20 cc in bag; cx's neg  Imaging: Dg Abd Portable 2v  09/04/2015  CLINICAL DATA:  abdominal discomfort. Vomiting. Pt had external drain placed on 1/10 and duodenal stent placed on 1/13. Hx renal cell carcinoma. EXAM: PORTABLE ABDOMEN - 2 VIEW COMPARISON:  08/31/2015 FINDINGS: Bowel gas pattern is unremarkable no evidence of obstruction. Surgical drain projects adjacent to the duodenal stent in the right mid abdomen. Another surgical drain projects lateral to the duodenal stent. There are degenerative changes of the lumbar spine with a levoscoliosis bones are demineralized. IMPRESSION: 1. No evidence of bowel obstruction. 2. Duodenal stent surgical drains are noted, similar to the operative imaging obtained on 08/31/2015. Electronically Signed   By: Lajean Manes M.D.   On: 09/04/2015 14:00    Labs:  CBC:  Recent Labs  08/30/15 0515 08/31/15 0500 09/03/15 0454 09/04/15 0526  WBC 9.8 9.8 7.3 8.5  HGB 12.1 11.6* 11.1* 11.3*  HCT 37.7 35.7* 33.4* 34.3*  PLT 344 268 302 317    COAGS:  Recent Labs  08/10/15 0715 08/20/15 0510 08/29/15 1050  INR 0.99 1.24 1.20  APTT  --  38*  --     BMP:  Recent Labs  09/02/15 0515 09/03/15 0454 09/04/15 0526 09/05/15 0447  NA 134* 137 136 134*  K 2.9* 3.5 3.7 4.3  CL 98* 102 101 98*  CO2 '25 25 26 26  ' GLUCOSE 102* 78 82 61*  BUN '17 15 13 14  ' CALCIUM 7.9* 8.2* 8.6* 8.4*  CREATININE 0.97 0.85 0.86 0.86  GFRNONAA 52* >60 >60 >60  GFRAA >60 >60 >60 >60    LIVER FUNCTION TESTS:  Recent Labs  09/02/15 0515 09/03/15 0454 09/04/15 0526 09/05/15 0447  BILITOT 1.0 0.8 0.7 0.7  AST 50* 72* 48* 43*  ALT 59* 73* 64* 53  ALKPHOS 172* 225* 216* 200*  PROT 6.3* 6.6 6.7 6.5    ALBUMIN 2.4* 2.5* 2.5* 2.3*    Assessment and Plan: Pt with hx met RCC with biliary obst/biloma, s/p RUQ biloma drainage 1/10, PTC with I/E biliary drain, RUQ drain injection 1/11; injection of RUQ drain shows fistula to rt post intrahepatic biliary ducts c/w bile leak; PTC shows severe intra and extrahepatic biliary ductal dilatation with high-grade stenosis bordering on occlusion at the juncture of the common hepatic and common bile ducts. The distal common bile duct and ampulla were patent; pt s/p duodenal stent on 1/13; AF; t bili nl, creat/K ok; recommend continuation of current treatment; once output of right upper quadrant drain has ceased will remove drain. As long as patient is tolerating capping of biliary drain will continue current coarse and set up for biliary stent in approximately 3 weeks.   Electronically Signed: D. Rowe Robert 09/05/2015, 3:20 PM   I spent a total of 15 minutes at the the patient's bedside AND on the patient's hospital floor or unit, greater than 50% of which was counseling/coordinating care for biliary /biloma drains

## 2015-09-05 NOTE — Progress Notes (Signed)
Physical Therapy Treatment Patient Details Name: Kim Donovan MRN: [id number] DOB: 1929-03-17 Today's Date: 2015/09/13    History of Present Illness AVETT BUONOCORE is a 82 y.o. female with a history of HTN. PMR, Hyperlipidemia, and Glaucoma, with recent biopsy of a Large Right Renal Mass and a Hepatic lesion on 08/10/2015 who presents to the ED with complaints of Nausea and Vomiting and Fever     PT Comments    Pt assisted with ambulation and reports increased weakness and fatigue however very motivated to continue progressing.  HR 99, 130, and 110 bpm pre, during, and post gait.  Follow Up Recommendations  SNF     Equipment Recommendations  Rolling walker with 5" wheels    Recommendations for Other Services       Precautions / Restrictions Precautions Precautions: Fall Precaution Comments: monitor HR    Mobility  Bed Mobility               General bed mobility comments: pt up in recliner on arrival  Transfers Overall transfer level: Needs assistance Equipment used: Rolling walker (2 wheeled) Transfers: Sit to/from Stand Sit to Stand: Min assist         General transfer comment: assist to rise and control descent  Ambulation/Gait Ambulation/Gait assistance: Min assist;Min guard Ambulation Distance (Feet): 140 Feet Assistive device: Rolling walker (2 wheeled) Gait Pattern/deviations: Step-through pattern;Decreased stride length     General Gait Details: pt with multiple standing rest breaks due to fatigue and dyspnea, 140 feet x2, HR 130 bpm during gait   Stairs            Wheelchair Mobility    Modified Rankin (Stroke Patients Only)       Balance                                    Cognition Arousal/Alertness: Awake/alert Behavior During Therapy: WFL for tasks assessed/performed Overall Cognitive Status: Within Functional Limits for tasks assessed                      Exercises      General Comments         Pertinent Vitals/Pain Pain Assessment: 0-10 Pain Score: 4  Pain Location: abdomen with transitional movement and coughing Pain Descriptors / Indicators: Sore Pain Intervention(s): Limited activity within patient's tolerance;Monitored during session    Home Living                      Prior Function            PT Goals (current goals can now be found in the care plan section) Progress towards PT goals: Progressing toward goals    Frequency  Min 3X/week    PT Plan Current plan remains appropriate    Co-evaluation             End of Session Equipment Utilized During Treatment: Gait belt Activity Tolerance: Patient limited by fatigue Patient left: with call bell/phone within reach;in chair;with family/visitor present     Time: [id number] PT Time Calculation (min) (ACUTE ONLY): 12 min  Charges:  $Gait Training: 8-22 mins                    G Codes:      Dannica Bickham,KATHrine E 09/13/15, 1:02 PM Carmelia Bake, PT, DPT Sep 13, 2015 Pager: [phone]

## 2015-09-05 NOTE — Progress Notes (Signed)
[facility] PROGRESS NOTE  Kim Donovan EQA:[id number] DOB: [birth year] DOA: 08/19/2015 PCP: Mathews Argyle, MD  HPI/Brief narrative 82 yo F with recently diagnosed metastatic renal cell carcinoma (preliminary pathology), additional immunohistochemical histochemical stains most consistent with a poorly differentiated urothelial carcinoma, admitted on 1/2 with nausea and vomiting, found to have biliary and duodenal obstruction  Interval events - Over the last 7 days, patient has been stable, she was initially nothing by mouth due to gastric outlet obstruction, and interventional radiology have placed 2 percutaneous drains, one in the biloma as well as an additional 1 which is internal/external into the biliary system. It appears that there is a fistulous communication between the biloma and the biliary system, likely due to malignancy. Gastroenterology was able to do an EGD on Friday and place a duodenal stent. Following the biliary drainage as well as a duodenal stent placement, patient's was able to tolerate gradual advancement of her diet, NG tube was able to be removed, she was initially placed on liquid diet which was later advanced to full and now soft. Overall patient is improving, however on the evening of 1/16 she had one episode of emesis. She has been found to have evidence of refeeding syndrome with hypophosphatemia requiring supplementation. Pending diet tolerance and correction of her hypophosphatemia, potential discharge in 2-3 days.  Assessment/Plan: Nausea/vomiting - NG tube now out after the duodenal stent was placed - On 1/16 pt had recurrence of vomiting. - Abd xray without obstruction. Pt was kept on full liquid - This am, pt is eager to advance diet. Will order soft diet  Biliary obstruction - CT scan on 1/6 with biliary dilatation, "Stable intrahepatic biliary dilatation with an abrupt cut off of the common bile duct" - elevated Alk phos, LFTs and bilirubin on  admission - now with 2 drains, one in the RUQ peritoneal fluid collection, also s/p PTC and placement of a 22F internal/external biliary drain on 1/11 by IR. There appears to be a fistulous connection between the fluid collection and the right posterior intrahepatic biliary ducts. - monitor drain outputs, remove RUQ drain once it stops draining - PTC drain capped since 1/13, LFTs remain stable. IR following. Plans for biliary stent in 3 weeks  Duodenal obstruction  - s/p stent placement 1/13 - patient with poor appetite, started Marinol over the weekend - With recurrence of her nausea and 1/16, kept on full liquids. Advance to soft today  Refeeding syndrome  - Closely monitor for refeeding, as her diet is advanced - hypophosphatemia resolved  Sepsis due to probable aspiration pneumonia - CT scan 1/6 with left lower lobe infiltrate - on Zosyn, started on 1/2, completed a course, stopped Zosyn 1/15 - Sepsis physiology resolved   Stage IV urothelial carcinoma, with liver mets - Dr. Benay Spice following, appreciate input. - evaluate for outpatient chemotherapy  RUQ peritoneal fluid collection - s/p drain placement per IR on 1/10, draining bilious fluid - cultures without growth so far, pending - IR following - still with ongoing drainage, Cannot remove currently   AKI on CKD III - suspect pre-renal on top of CKD, it appears that her GFR in 2016 was ~40, prior to that in 2012 within normal limits - renal function stable  Hypokalemia - likely due to poor po intake in the past few days, rplete and continue to monitor  HTN - remains stable  PMR  S/p bioprosthetic AVR  Chronic diastolic CHF - mild LE edema, now she is eating, will discontinue IVF and  resume home Lasix - monitor renal function  - Resume by mouth Lasix , she received one IV dose of Lasix on Saturday. Renal function is stable.  Lower extremity swelling - Likely in the setting of mild fluid overload, however  appears to be asymmetric this morning with right lower extremity more swollen than left lower extremity. - DVT ultrasound was negative  Code Status: DNR Family Communication: Pt in room Disposition Plan: Possible d/c in 24-48hrs   Consultants:  IR  GI  Oncology  Procedures:  EGD 1/10 ENDOSCOPIC IMPRESSION: 1. Severe stricture in the 2nd part of the duodenum, ERCP not attempted 2. Large volume of food residue in the gastric fundus and gastric body 3. Stricture at the gastroesophageal junction  EGD 1/13 ENDOSCOPIC IMPRESSION: 1. Esophagitis secondary to NG tube trauma in the mid an distal esophagus 2. Nasogastric tube trauma was evident in the cardia and gastric body 3. Acquired stenosis (malignant external compression) was found at the D1-D2 junction; successful placement of 9 cm duodenal stent across the stricture  Antibiotics: Anti-infectives    Start     Dose/Rate Route Frequency Ordered Stop   08/28/15 1345  Ampicillin-Sulbactam (UNASYN) 3 g in sodium chloride 0.9 % 100 mL IVPB  Status:  Discontinued     3 g 100 mL/hr over 60 Minutes Intravenous  Once 08/28/15 1332 08/28/15 1518   08/27/15 1800  piperacillin-tazobactam (ZOSYN) IVPB 3.375 g  Status:  Discontinued     3.375 g 12.5 mL/hr over 240 Minutes Intravenous Every 8 hours 08/27/15 1722 09/02/15 1522   08/24/15 2000  levofloxacin (LEVAQUIN) tablet 750 mg  Status:  Discontinued     750 mg Oral Every 48 hours 08/24/15 1530 08/26/15 1602   08/22/15 1400  azithromycin (ZITHROMAX) 500 mg in dextrose 5 % 250 mL IVPB  Status:  Discontinued     500 mg 250 mL/hr over 60 Minutes Intravenous Every 24 hours 08/22/15 1329 08/24/15 1530   08/21/15 0500  vancomycin (VANCOCIN) IVPB 1000 mg/200 mL premix  Status:  Discontinued     1,000 mg 200 mL/hr over 60 Minutes Intravenous Every 24 hours 08/20/15 0442 08/23/15 1350   08/20/15 1200  piperacillin-tazobactam (ZOSYN) IVPB 3.375 g  Status:  Discontinued     3.375  g 12.5 mL/hr over 240 Minutes Intravenous Every 8 hours 08/20/15 0442 08/24/15 1530   08/20/15 0245  vancomycin (VANCOCIN) IVPB 1000 mg/200 mL premix     1,000 mg 200 mL/hr over 60 Minutes Intravenous  Once 08/20/15 0244 08/20/15 0616   08/20/15 0245  piperacillin-tazobactam (ZOSYN) IVPB 3.375 g     3.375 g 100 mL/hr over 30 Minutes Intravenous  Once 08/20/15 0244 08/20/15 0416      HPI/Subjective: Reports feeling better today  Objective: Filed Vitals:   09/04/15 1320 09/04/15 2236 09/05/15 0527 09/05/15 1501  BP: 96/67 118/73 133/89 124/80  Pulse: 99 102 99 102  Temp: 98.1 F (36.7 C) 97.6 F (36.4 C) 97.8 F (36.6 C) 98.1 F (36.7 C)  TempSrc: Oral Oral Oral Oral  Resp: '20 18 18 20  ' Height:      Weight:   63 kg (138 lb 14.2 oz)   SpO2: 98% 98% 98% 98%    Intake/Output Summary (Last 24 hours) at 09/05/15 1708 Last data filed at 09/05/15 1200  Gross per 24 hour  Intake    540 ml  Output      0 ml  Net    540 ml   [employer]  09/03/15 0518 09/04/15 0521 09/05/15 0527  Weight: 64.32 kg (141 lb 12.8 oz) 63.7 kg (140 lb 6.9 oz) 63 kg (138 lb 14.2 oz)    Exam:   General:  Awake, in nad  Cardiovascular: regular, s1, s2  Respiratory: normal resp effort, no wheezing  Abdomen: soft,nondistended  Musculoskeletal: perfused, no clubbing   Data Reviewed: Basic Metabolic Panel:  Recent Labs Lab 09/01/15 0537 09/02/15 0515 09/03/15 0454 09/04/15 0526 09/05/15 0447  NA 137 134* 137 136 134*  K 3.4* 2.9* 3.5 3.7 4.3  CL 98* 98* 102 101 98*  CO2 '28 25 25 26 26  ' GLUCOSE 98 102* 78 82 61*  BUN '20 17 15 13 14  ' CREATININE 0.97 0.97 0.85 0.86 0.86  CALCIUM 8.2* 7.9* 8.2* 8.6* 8.4*  MG  --   --  1.9 1.9 1.8  PHOS  --   --  1.3* 1.8* 3.2   Liver Function Tests:  Recent Labs Lab 09/01/15 0537 09/02/15 0515 09/03/15 0454 09/04/15 0526 09/05/15 0447  AST 41 50* 72* 48* 43*  ALT 60* 59* 73* 64* 53  ALKPHOS 183* 172* 225* 216* 200*  BILITOT 1.3* 1.0 0.8  0.7 0.7  PROT 6.3* 6.3* 6.6 6.7 6.5  ALBUMIN 2.3* 2.4* 2.5* 2.5* 2.3*   No results for input(s): LIPASE, AMYLASE in the last 168 hours. No results for input(s): AMMONIA in the last 168 hours. CBC:  Recent Labs Lab 08/30/15 0515 08/31/15 0500 09/03/15 0454 09/04/15 0526  WBC 9.8 9.8 7.3 8.5  HGB 12.1 11.6* 11.1* 11.3*  HCT 37.7 35.7* 33.4* 34.3*  MCV 92.2 91.8 89.3 89.8  PLT 344 268 302 317   Cardiac Enzymes: No results for input(s): CKTOTAL, CKMB, CKMBINDEX, TROPONINI in the last 168 hours. BNP (last 3 results)  Recent Labs  05/15/15 1115 06/15/15 0955  BNP 111.4* 100.1*    ProBNP (last 3 results) No results for input(s): PROBNP in the last 8760 hours.  CBG: No results for input(s): GLUCAP in the last 168 hours.  Recent Results (from the past 240 hour(s))  Culture, blood (routine x 2)     Status: None   Collection Time: 08/27/15 12:55 PM  Result Value Ref Range Status   Specimen Description BLOOD LEFT ARM  Final   Special Requests BOTTLES DRAWN AEROBIC AND ANAEROBIC 10CC  Final   Culture   Final    NO GROWTH 5 DAYS Performed at [facility]    Report Status 09/01/2015 FINAL  Final  Culture, blood (routine x 2)     Status: None   Collection Time: 08/27/15  1:10 PM  Result Value Ref Range Status   Specimen Description BLOOD RIGHT HAND  Final   Special Requests IN PEDIATRIC BOTTLE [facility]  Final   Culture   Final    NO GROWTH 5 DAYS Performed at [facility]    Report Status 09/01/2015 FINAL  Final  Culture, Urine     Status: None   Collection Time: 08/28/15 12:25 AM  Result Value Ref Range Status   Specimen Description URINE, RANDOM  Final   Special Requests NONE  Final   Culture   Final    20,000 COLONIES/mL YEAST Performed at [facility]    Report Status 08/29/2015 FINAL  Final  Culture, routine-abscess     Status: None   Collection Time: 08/28/15 12:58 PM  Result Value Ref Range Status   Specimen Description DRAINAGE RUQ CT  DRAIN  Final   Special Requests NONE  Final   Gram Stain   Final    NO WBC SEEN NO SQUAMOUS EPITHELIAL CELLS SEEN NO ORGANISMS SEEN Performed at [facility]    Culture   Final    NO GROWTH 3 DAYS Performed at [facility]    Report Status 08/31/2015 FINAL  Final     Studies: Dg Abd Portable 2v  09/04/2015  CLINICAL DATA:  abdominal discomfort. Vomiting. Pt had external drain placed on 1/10 and duodenal stent placed on 1/13. Hx renal cell carcinoma. EXAM: PORTABLE ABDOMEN - 2 VIEW COMPARISON:  08/31/2015 FINDINGS: Bowel gas pattern is unremarkable no evidence of obstruction. Surgical drain projects adjacent to the duodenal stent in the right mid abdomen. Another surgical drain projects lateral to the duodenal stent. There are degenerative changes of the lumbar spine with a levoscoliosis bones are demineralized. IMPRESSION: 1. No evidence of bowel obstruction. 2. Duodenal stent surgical drains are noted, similar to the operative imaging obtained on 08/31/2015. Electronically Signed   By: Lajean Manes M.D.   On: 09/04/2015 14:00    Scheduled Meds: . antiseptic oral rinse  7 mL Mouth Rinse BID  . dorzolamide-timolol  1 drop Left Eye BID  . dronabinol  2.5 mg Oral BID AC  . enoxaparin (LOVENOX) injection  40 mg Subcutaneous Q24H  . feeding supplement (ENSURE ENLIVE)  237 mL Oral BID BM  . furosemide  20 mg Oral Daily  . indomethacin  100 mg Rectal Once  . pantoprazole  40 mg Oral Daily  . sodium chloride  1,000 mL Intravenous Once  . sodium chloride  3 mL Intravenous Q12H   Continuous Infusions:   Principal Problem:   Sepsis ([place]) Active Problems:   GLAUCOMA   Polymyalgia rheumatica (HCC)   S/P AVR (aortic valve replacement)   Chronic diastolic CHF (congestive heart failure) (HCC)   Renal mass, right   Liver masses   AKI (acute kidney injury) (HCC)   Nausea and vomiting   Hyponatremia   Essential hypertension   Intraabdominal fluid collection   Hepatic  metastases (HCC)   CAP (community acquired pneumonia)   Elevated lipase   Transaminitis   Fever, unspecified   Abdominal fluid collection   Metastatic renal cell carcinoma (HCC)   Elevated LFTs   Acquired dilation of bile duct   Gastric outlet obstruction   Abnormal CT scan, stomach   Biliary obstruction   Bile duct rupture   Biliary obstruction due to malignant neoplasm   Duodenal stenosis    Lennan Malone K  [facility] Pager [phone]. If 7PM-7AM, please contact night-coverage at [email or web address], password [facility] 09/05/2015, 5:08 PM  LOS: 16 days

## 2015-09-06 ENCOUNTER — Telehealth: Payer: Self-pay | Admitting: Oncology

## 2015-09-06 ENCOUNTER — Other Ambulatory Visit: Payer: Self-pay | Admitting: Radiology

## 2015-09-06 ENCOUNTER — Other Ambulatory Visit: Payer: Self-pay | Admitting: *Deleted

## 2015-09-06 DIAGNOSIS — C801 Malignant (primary) neoplasm, unspecified: Secondary | ICD-10-CM

## 2015-09-06 DIAGNOSIS — K831 Obstruction of bile duct: Principal | ICD-10-CM

## 2015-09-06 LAB — BASIC METABOLIC PANEL
Anion gap: 9 (ref 5–15)
BUN: 17 mg/dL (ref 6–20)
CHLORIDE: 101 mmol/L (ref 101–111)
CO2: 25 mmol/L (ref 22–32)
CREATININE: 0.99 mg/dL (ref 0.44–1.00)
Calcium: 9 mg/dL (ref 8.9–10.3)
GFR calc Af Amer: 59 mL/min — ABNORMAL LOW (ref >60)
GFR calc non Af Amer: 51 mL/min — ABNORMAL LOW (ref >60)
GLUCOSE: 96 mg/dL (ref 65–99)
Potassium: 4 mmol/L (ref 3.5–5.1)
Sodium: 135 mmol/L (ref 135–145)

## 2015-09-06 LAB — CBC
HCT: 31.9 % — ABNORMAL LOW (ref 36.0–46.0)
Hemoglobin: 10.5 g/dL — ABNORMAL LOW (ref 12.0–15.0)
MCH: 29.7 pg (ref 26.0–34.0)
MCHC: 32.9 g/dL (ref 30.0–36.0)
MCV: 90.1 fL (ref 78.0–100.0)
PLATELETS: 333 10*3/uL (ref 150–400)
RBC: 3.54 MIL/uL — ABNORMAL LOW (ref 3.87–5.11)
RDW: 15.3 % (ref 11.5–15.5)
WBC: 7.8 10*3/uL (ref 4.0–10.5)

## 2015-09-06 MED ORDER — ONDANSETRON HCL 4 MG PO TABS: 4.0000 mg | ORAL_TABLET | Freq: Four times a day (QID) | ORAL | Status: DC | PRN

## 2015-09-06 MED ORDER — PANTOPRAZOLE SODIUM 40 MG PO TBEC: 40.0000 mg | DELAYED_RELEASE_TABLET | Freq: Every day | ORAL | Status: DC

## 2015-09-06 MED ORDER — ALPRAZOLAM 0.25 MG PO TABS: 0.2500 mg | ORAL_TABLET | Freq: Every evening | ORAL | Status: DC | PRN

## 2015-09-06 MED ORDER — DRONABINOL 2.5 MG PO CAPS: 2.5000 mg | ORAL_CAPSULE | Freq: Two times a day (BID) | ORAL | Status: DC

## 2015-09-06 NOTE — Telephone Encounter (Signed)
per pof to move pt lab to 1/27-cld pt & left message of time change and date

## 2015-09-06 NOTE — Discharge Summary (Addendum)
Physician Discharge Summary  CIENA SAMPLEY UOR:[id number] DOB: 03/31/1929 DOA: 08/19/2015  PCP: Mathews Argyle, MD  Admit date: 08/19/2015 Discharge date: 09/06/2015  Time spent: 20 minutes  Recommendations for Outpatient Follow-up:  1. Follow up with Interventional Radiology in 2-3 weeks, as scheduled 2. Follow up with Dr. Benay Spice, Oncology as scheduled already   Discharge Diagnoses:  Principal Problem:   Sepsis [facility]) Active Problems:   GLAUCOMA   Polymyalgia rheumatica ([place])   S/P AVR (aortic valve replacement)   Chronic diastolic CHF (congestive heart failure) ([place])   Renal mass, right   Liver masses   AKI (acute kidney injury) ([place])   Nausea and vomiting   Hyponatremia   Essential hypertension   Intraabdominal fluid collection   Hepatic metastases ([place])   CAP (community acquired pneumonia)   Elevated lipase   Transaminitis   Fever, unspecified   Abdominal fluid collection   Metastatic renal cell carcinoma (HCC)   Elevated LFTs   Acquired dilation of bile duct   Gastric outlet obstruction   Abnormal CT scan, stomach   Biliary obstruction   Bile duct rupture   Biliary obstruction due to malignant neoplasm   Duodenal stenosis   Abdominal complaints   Discharge Condition: Improved  Diet recommendation: Soft diet as tolerated  Filed Weights   09/04/15 0521 09/05/15 0527 09/06/15 0558  Weight: 63.7 kg (140 lb 6.9 oz) 63 kg (138 lb 14.2 oz) 64.4 kg (141 lb 15.6 oz)    History of present illness:  Please review dictated H and P from 1/1 for details. Briefly, 82 yo F with recently diagnosed metastatic renal cell carcinoma (preliminary pathology), additional immunohistochemical histochemical stains most consistent with a poorly differentiated urothelial carcinoma, admitted on 1/2 with nausea and vomiting, found to have biliary and duodenal obstruction  Hospital Course:  Interval events - Over the last 7 days prior to discharge, patient has remained stable, she  was initially NPO due to gastric outlet obstruction, and interventional radiology have placed 2 percutaneous drains, one in the biloma as well as an additional 1 which is internal/external into the biliary system. It appears that there is a fistulous communication between the biloma and the biliary system, likely due to malignancy. Gastroenterology was able to do an EGD on Friday and place a duodenal stent. Following the biliary drainage as well as a duodenal stent placement, patient's was able to tolerate gradual advancement of her diet, NG tube was able to be removed, she was initially placed on liquid diet which was later advanced to full and now soft. Overall patient is improving, however on the evening of 1/16 she had one episode of emesis. She has been found to have evidence of refeeding syndrome with hypophosphatemia requiring supplementation.   Assessment/Plan: Nausea/vomiting - NG tube now out after the duodenal stent was placed - On 1/16 pt had recurrence of vomiting. - Abd xray without obstruction. Pt was kept on full liquid - Patient was later successfully advanced to soft diet by day of discharge  Biliary obstruction - CT scan on 1/6 with biliary dilatation, "Stable intrahepatic biliary dilatation with an abrupt cut off of the common bile duct" - elevated Alk phos, LFTs and bilirubin on admission - now with 2 drains, one in the RUQ peritoneal fluid collection, also s/p PTC and placement of a 66F internal/external biliary drain on 1/11 by IR. There appears to be a fistulous connection between the fluid collection and the right posterior intrahepatic biliary ducts. - PTC drain capped since 1/13,  LFTs remain stable. IR following with plans for biliary stent in 3 weeks  Duodenal obstruction  - s/p stent placement 1/13 - patient with poor appetite, started Marinol over the weekend - With recurrence of her nausea and 1/16, kept on full liquids and later successfully advanced to soft  diet  Refeeding syndrome  - Closely monitor for refeeding, as her diet is advanced - hypophosphatemia resolved  Sepsis due to probable aspiration pneumonia - CT scan 1/6 with left lower lobe infiltrate - on Zosyn, started on 1/2, completed a course, stopped Zosyn 1/15 - Sepsis physiology resolved   Stage IV urothelial carcinoma, with liver mets - Dr. Benay Spice following, appreciate input. - evaluate for outpatient chemotherapy  RUQ peritoneal fluid collection - s/p drain placement per IR on 1/10, draining bilious fluid - cultures without growth so far, pending - IR had been following, per above  AKI on CKD III - suspect pre-renal on top of CKD, it appears that her GFR in 2016 was ~40, prior to that in 2012 within normal limits - renal function stable  Hypokalemia - likely due to poor po intake in the past few days, rplete and continue to monitor  HTN - remains stable  PMR  S/p bioprosthetic AVR  Chronic diastolic CHF - mild LE edema, now she is eating, will discontinue IVF and resume home Lasix - monitor renal function  - Resume by mouth Lasix , she received one IV dose of Lasix on Saturday. Renal function is stable.  Lower extremity swelling - Likely in the setting of mild fluid overload, however appears to be asymmetric this morning with right lower extremity more swollen than left lower extremity. - DVT ultrasound was negative  Severe Protein Calorie Malnutrition -Nutrition consulted  Procedures:  EGD 1/10 ENDOSCOPIC IMPRESSION: 1. Severe stricture in the 2nd part of the duodenum, ERCP not attempted 2. Large volume of food residue in the gastric fundus and gastric body 3. Stricture at the gastroesophageal junction  EGD 1/13 ENDOSCOPIC IMPRESSION: 1. Esophagitis secondary to NG tube trauma in the mid an distal esophagus 2. Nasogastric tube trauma was evident in the cardia and gastric body 3. Acquired stenosis (malignant external compression)  was found at the D1-D2 junction; successful placement of 9 cm duodenal stent across the stricture  Consultations:  IR  GI  Oncology  Discharge Exam: Filed Vitals:   09/05/15 0527 09/05/15 1501 09/05/15 2006 09/06/15 0558  BP: 133/89 124/80 105/72 112/68  Pulse: 99 102 99 97  Temp: 97.8 F (36.6 C) 98.1 F (36.7 C) 97.8 F (36.6 C) 98.3 F (36.8 C)  TempSrc: Oral Oral Oral Oral  Resp: _0 Height:      Weight: 63 kg (138 lb 14.2 oz)   64.4 kg (141 lb 15.6 oz)  SpO2: 98% 98% 100% 97%    General: awake, in nad Cardiovascular: regular, s1, s2 Respiratory: normal resp effort, no wheezing  Discharge Instructions     Medication List    STOP taking these medications        potassium chloride 10 MEQ tablet  Commonly known as:  K-DUR     PREDNISONE (PAK) PO      TAKE these medications        ALPRAZolam 0.25 MG tablet  Commonly known as:  XANAX  Take 1 tablet (0.25 mg total) by mouth at bedtime as needed for anxiety.     aspirin 81 MG tablet  Take 81 mg by mouth daily.  Calcium Citrate-Vitamin D 250-200 MG-UNIT Tabs  Take 2 tablets by mouth daily.     CENTRUM SILVER ULTRA WOMENS PO  Take 1 tablet by mouth daily.     dorzolamide-timolol 22.3-6.8 MG/ML ophthalmic solution  Commonly known as:  COSOPT  Place 1 drop into the left eye 2 (two) times daily.     dronabinol 2.5 MG capsule  Commonly known as:  MARINOL  Take 1 capsule (2.5 mg total) by mouth 2 (two) times daily before lunch and supper.     ezetimibe 10 MG tablet  Commonly known as:  ZETIA  Take 1 tablet (10 mg total) by mouth daily.     Fish Oil 1000 MG Caps  Take 1 capsule by mouth daily. Reported on 08/06/2015     furosemide 20 MG tablet  Commonly known as:  LASIX  Take 1 tablet (20 mg total) by mouth daily.     ondansetron 4 MG tablet  Commonly known as:  ZOFRAN  Take 1 tablet (4 mg total) by mouth every 6 (six) hours as needed for nausea.     pantoprazole 40 MG tablet   Commonly known as:  PROTONIX  Take 1 tablet (40 mg total) by mouth daily.     pazopanib 200 MG tablet  Commonly known as:  VOTRIENT  Take 4 tablets (800 mg total) by mouth daily. Take on an empty stomach.     TYLENOL PM EXTRA STRENGTH PO  Take 1 tablet by mouth at bedtime as needed (pain/sleep).       Allergies  Allergen Reactions  . Tramadol Rash    After surgery pt  broke out into a rash.Pt stop taking.   Follow-up Information    Follow up with [facility].   Specialty:  [place] information:   [address] [place] [phone]      Follow up with Mathews Argyle, MD. Schedule an appointment as soon as possible for a visit in 2 weeks.   Specialty:  Internal Medicine   Why:  Hospital follow up   Contact information:   [address] [place] [address] [phone]       Go to Betsy Coder, MD.   Specialty:  Oncology   Why:  as scheduled   Contact information:   [place] [id number] [phone]        The results of significant diagnostics from this hospitalization (including imaging, microbiology, ancillary and laboratory) are listed below for reference.    Significant Diagnostic Studies: Ct Abdomen Pelvis W Wo Contrast  08/08/2015  CLINICAL DATA:  Right-sided abdominal pain, abdominal discomfort, weight loss. Right renal mass with liver lesions on MR/ultrasound. EXAM: CT CHEST, ABDOMEN, AND PELVIS WITH CONTRAST TECHNIQUE: Multidetector CT imaging of the chest, abdomen and pelvis was performed following the standard protocol during bolus administration of intravenous contrast. CONTRAST:  166m OMNIPAQUE IOHEXOL 300 MG/ML  SOLN COMPARISON:  None. FINDINGS: CT CHEST FINDINGS Mediastinum/Nodes: The heart is normal in size. No pericardial effusion. Coronary atherosclerosis. Prosthetic aortic valve. Ectasia of the ascending thoracic aorta, measuring 3.8 cm.  Atherosclerotic calcifications of the aortic arch. Scattered small mediastinal lymph nodes, including an 8 mm short axis high right paratracheal node (series 6/ image 19) and a 9 mm short axis subcarinal node (series 6/ image 39). Visualized thyroid is unremarkable. Lungs/Pleura: Lungs are essentially clear. Biapical pleural parenchymal scarring.  No focal consolidation. No suspicious pulmonary nodules. No pleural  effusion or pneumothorax. Musculoskeletal: Degenerative changes of the thoracic spine. Thoracic dextroscoliosis. CT ABDOMEN PELVIS FINDINGS Hepatobiliary: 11 mm hypoenhancing lesion in the posterior right hepatic dome (series 6/ image 81), suspicious for metastasis. Associated arterial perfusion anomaly (series 4/image 10). Two suspected hypoenhancing lesions along the posterior aspect of segment 6, measuring 1.7 x 1.9 cm (series 6/image 110) and 1.5 x 1.6 cm (series 6/image 115), suspicious for metastases. Associated arterial perfusion anomaly predominantly involving segment 6 of the liver (series 4/image 22). Status post cholecystectomy. Mild intrahepatic and extrahepatic ductal dilatation, likely secondary to extrinsic compression from the right renal mass. Pancreas: Within normal limits. Spleen: Within normal limits. Adrenals/Urinary Tract: Left adrenal gland is within normal limits. Right adrenal gland is poorly visualized but likely within normal limits. Left kidney is notable for a 7 mm cyst in the medial left upper pole (series 6/ image 96). Additional 15 mm medial left upper pole renal sinus cyst. No hydronephrosis. 6.3 x 7.7 x 9.7 cm infiltrating hypoenhancing mass replacing the right upper kidney. Mass extends into the right renal vein (series 6/image 95). Additional local extension of tumor into the anterior pararenal space and adjacent to the posterior liver margin (series 6/image 101). Tumor also abuts but does not definitely involve the psoas muscle (series 6/image 104). 2.1 x 1.1 cm fluid  collection along the hepatorenal fossa (series 6/ image 100). Stomach/Bowel: Stomach is moderately distended. No evidence of bowel obstruction. Vascular/Lymphatic: Atherosclerotic calcifications of the abdominal aorta and branch vessels. No suspicious abdominopelvic lymphadenopathy. Reproductive: Status post hysterectomy. Left ovary is within normal limits.  No right adnexal mass. Other: No abdominopelvic ascites. Musculoskeletal: Degenerative changes of the lumbar spine. Probable bone island at L5. Lumbar levoscoliosis. IMPRESSION: 9.7 cm infiltrating mass involving the right upper kidney. Extension into the right renal vein. Local extension of tumor into the anterior perirenal space. Three hepatic metastases in segments 6 and 7, measuring up to 1.9 cm. No evidence of metastatic disease in the chest. Additional ancillary findings as above. Electronically Signed   By: Julian Hy M.D.   On: 08/08/2015 14:54   Ct Abdomen Wo Contrast  08/27/2015  CLINICAL DATA:  Abscess EXAM: CT ABDOMEN WITHOUT CONTRAST TECHNIQUE: Multidetector CT imaging of the abdomen was performed following the standard protocol without IV contrast. COMPARISON:  08/23/2015 FINDINGS: The stomach is severely distended with fluid. The ill-defined fluid collection in the right lower quadrant measure 6.1 x 5.3 cm. It has slightly enlarged since the prior study. Biliary dilatation persists. Tumor infiltration of the right kidney is unchanged. No left hydronephrosis. Small right pleural effusion is stable. Patchy airspace disease at the left lung base has slightly increased. No free-fluid in the abdomen.  No free intraperitoneal gas. IMPRESSION: The stomach is severely distended. This presents an aspiration risk with moderate sedation. NG tube decompression of the stomach is recommended prior to abscess drainage. The fluid collection in question within the right lower quadrant has slightly enlarged. Stable biliary dilatation. Increasing patchy  airspace disease at the left lung base. Consider aspiration pneumonia. Electronically Signed   By: Marybelle Killings M.D.   On: 08/27/2015 16:17   Dg Abd 1 View - Kub  08/31/2015  CLINICAL DATA:  Gastric obstruction. Biliary ductal dilatation. Abscess. Right renal neoplasm. EXAM: DG C-ARM 61-120 MIN; ABDOMEN - 1 VIEW COMPARISON:  None. FINDINGS: Two cross-table lateral fluoroscopic spot images show placement of an internal wall stent in the expected region of the duodenum. Two percutaneous pigtail catheters are also seen in this  region. IMPRESSION: Internal wall stent seen expected region of duodenum. Percutaneous pigtail catheter is also seen in this region. Electronically Signed   By: Earle Gell M.D.   On: 08/31/2015 13:59   Dg Abd 1 View  08/28/2015  CLINICAL DATA:  Biliary obstruction. EXAM: ABDOMEN - 1 VIEW COMPARISON:  CT 08/28/2015. FINDINGS: Endoscopic tube noted. No contrast noted in the biliary system, by history patient has a duodenum stricture that did not allow cannulation of the biliary ductal system. NG tube noted . Right upper quadrant catheter noted. IMPRESSION: No contrast in the biliary system. By history patient has a duodenum stricture not allowing cannulation of the biliary system. Electronically Signed   By: Marcello Moores  Register   On: 08/28/2015 14:49   Dg Abd 1 View  08/22/2015  CLINICAL DATA:  Nausea and vomiting multiple episodes last night EXAM: ABDOMEN - 1 VIEW COMPARISON:  08/20/2015 and 08/08/2015 FINDINGS: There is normal small bowel gas pattern. Levoscoliosis and degenerative changes are noted lumbar spine. There is moderate gastric distension with gas. Findings suspicious for gastroparesis or gastric outlet obstruction. IMPRESSION: Normal small bowel gas pattern. Moderate gaseous distension of the stomach. Findings suspicious for gastroparesis or gastric outlet obstruction. Electronically Signed   By: Lahoma Crocker M.D.   On: 08/22/2015 11:26   Ct Chest W Contrast  08/08/2015   CLINICAL DATA:  Right-sided abdominal pain, abdominal discomfort, weight loss. Right renal mass with liver lesions on MR/ultrasound. EXAM: CT CHEST, ABDOMEN, AND PELVIS WITH CONTRAST TECHNIQUE: Multidetector CT imaging of the chest, abdomen and pelvis was performed following the standard protocol during bolus administration of intravenous contrast. CONTRAST:  168m OMNIPAQUE IOHEXOL 300 MG/ML  SOLN COMPARISON:  None. FINDINGS: CT CHEST FINDINGS Mediastinum/Nodes: The heart is normal in size. No pericardial effusion. Coronary atherosclerosis. Prosthetic aortic valve. Ectasia of the ascending thoracic aorta, measuring 3.8 cm. Atherosclerotic calcifications of the aortic arch. Scattered small mediastinal lymph nodes, including an 8 mm short axis high right paratracheal node (series 6/ image 19) and a 9 mm short axis subcarinal node (series 6/ image 39). Visualized thyroid is unremarkable. Lungs/Pleura: Lungs are essentially clear. Biapical pleural parenchymal scarring.  No focal consolidation. No suspicious pulmonary nodules. No pleural effusion or pneumothorax. Musculoskeletal: Degenerative changes of the thoracic spine. Thoracic dextroscoliosis. CT ABDOMEN PELVIS FINDINGS Hepatobiliary: 11 mm hypoenhancing lesion in the posterior right hepatic dome (series 6/ image 81), suspicious for metastasis. Associated arterial perfusion anomaly (series 4/image 10). Two suspected hypoenhancing lesions along the posterior aspect of segment 6, measuring 1.7 x 1.9 cm (series 6/image 110) and 1.5 x 1.6 cm (series 6/image 115), suspicious for metastases. Associated arterial perfusion anomaly predominantly involving segment 6 of the liver (series 4/image 22). Status post cholecystectomy. Mild intrahepatic and extrahepatic ductal dilatation, likely secondary to extrinsic compression from the right renal mass. Pancreas: Within normal limits. Spleen: Within normal limits. Adrenals/Urinary Tract: Left adrenal gland is within normal  limits. Right adrenal gland is poorly visualized but likely within normal limits. Left kidney is notable for a 7 mm cyst in the medial left upper pole (series 6/ image 96). Additional 15 mm medial left upper pole renal sinus cyst. No hydronephrosis. 6.3 x 7.7 x 9.7 cm infiltrating hypoenhancing mass replacing the right upper kidney. Mass extends into the right renal vein (series 6/image 95). Additional local extension of tumor into the anterior pararenal space and adjacent to the posterior liver margin (series 6/image 101). Tumor also abuts but does not definitely involve the psoas muscle (series 6/image  104). 2.1 x 1.1 cm fluid collection along the hepatorenal fossa (series 6/ image 100). Stomach/Bowel: Stomach is moderately distended. No evidence of bowel obstruction. Vascular/Lymphatic: Atherosclerotic calcifications of the abdominal aorta and branch vessels. No suspicious abdominopelvic lymphadenopathy. Reproductive: Status post hysterectomy. Left ovary is within normal limits.  No right adnexal mass. Other: No abdominopelvic ascites. Musculoskeletal: Degenerative changes of the lumbar spine. Probable bone island at L5. Lumbar levoscoliosis. IMPRESSION: 9.7 cm infiltrating mass involving the right upper kidney. Extension into the right renal vein. Local extension of tumor into the anterior perirenal space. Three hepatic metastases in segments 6 and 7, measuring up to 1.9 cm. No evidence of metastatic disease in the chest. Additional ancillary findings as above. Electronically Signed   By: Julian Hy M.D.   On: 08/08/2015 14:54   Ct Abdomen Pelvis W Contrast  08/24/2015  ADDENDUM REPORT: 08/24/2015 11:26 ADDENDUM: I reviewed the patient's prior CT scan from 2008. This study demonstrated mild intrahepatic biliary dilatation and moderate extra hepatic biliary dilatation with the common bile duct measuring approximately 11 mm. The findings on the recent CT scans are most likely due to progressive  intrahepatic biliary dilatation due to the patient's age and prior cholecystectomy. There may be some extrinsic compression of the intrapancreatic portion of the common bile duct due to the right renal mass and perinephric fluid. No obvious intraductal mass, pancreatic mass or adenopathy. Patient's liver function studies are normal. Attention on future scans is suggested. Electronically Signed   By: Marijo Sanes M.D.   On: 08/24/2015 11:26  08/24/2015  CLINICAL DATA:  Followup diverticulosis.  Improving abdominal pain. EXAM: CT ABDOMEN AND PELVIS WITH CONTRAST TECHNIQUE: Multidetector CT imaging of the abdomen and pelvis was performed using the standard protocol following bolus administration of intravenous contrast. CONTRAST:  145m OMNIPAQUE IOHEXOL 300 MG/ML  SOLN COMPARISON:  Prior CT scans 08/20/2015 and 08/08/2015. Prior MRI examination 08/03/2015 FINDINGS: Lower chest: A small to moderate-sized right pleural effusion is noted. This has enlarged since the prior CT scan. There is overlying atelectasis. Patchy left lower lobe infiltrate and small left effusion. The heart is mildly enlarged. Prosthetic aortic valve is noted. Coronary artery calcifications are again demonstrated. Hepatobiliary: Stable significant intrahepatic biliary dilatation without abrupt cut off afford enters the pancreatic head. Findings could be due to a tight stricture or biliary tumor. The common bile duct in the pancreatic head is normal in caliber in the main pancreatic duct is normal in caliber. Again demonstrated are stable hepatic metastatic lesions. The portal and hepatic veins are patent. Pancreas: No mass or inflammation. Spleen: Normal size.  No focal lesions. Adrenals/Urinary Tract: Stable diffusely infiltrated right kidney, most likely by tumor. Chronic infection is also possible. Tumor/clot noted in a dilated right renal vein. Stomach/Bowel: The stomach, duodenum, small bowel and colon are grossly normal. Vascular/Lymphatic:  Tortuosity, ectasia and calcification of the thoracic aorta. The branch vessels are patent. Stable mesenteric and retroperitoneal lymph nodes. Other: Stable complex fluid collection in the mesentery surrounded by small bowel loops and vessels. There is also moderate free pelvic fluid. The bladder appears normal. No pelvic mass or adenopathy. No inguinal mass or adenopathy. Musculoskeletal: No significant bony findings. Scoliosis and degenerative changes noted in the spine. IMPRESSION: 1. Overall stable CT findings in the abdomen/pelvis. 2. Enlarging right pleural effusion and worsening left lower lobe infiltrate. 3. Stable hepatic metastatic lesions. 4. Stable intrahepatic biliary dilatation with an abrupt cut off of the common bile duct most likely due to a tight  stricture or cholangiocarcinoma. 5. Diffusely infiltrated right kidney. This could be an infiltrating renal cell carcinoma or transitional cell carcinoma. 6. Stable complex fluid collection in the small bowel mesenteric. Slight increase in pelvic fluid. Electronically Signed: By: Marijo Sanes M.D. On: 08/23/2015 17:41   Ct Abdomen Pelvis W Contrast  08/20/2015  CLINICAL DATA:  Acute onset of nausea, vomiting and fever. Recent liver biopsy on August 10, 2015. Initial encounter. EXAM: CT ABDOMEN AND PELVIS WITH CONTRAST TECHNIQUE: Multidetector CT imaging of the abdomen and pelvis was performed using the standard protocol following bolus administration of intravenous contrast. CONTRAST:  63m OMNIPAQUE IOHEXOL 300 MG/ML  SOLN COMPARISON:  CT of the chest, abdomen and pelvis performed 08/08/2015, and MRI of the abdomen performed 08/03/2015 FINDINGS: A trace right-sided pleural fluid is noted, new from the prior study, possibly reactive in nature. Scattered coronary artery calcifications are noted. The known multiple metastatic lesions within the liver are better characterized on prior CT; associated perfusion anomalies are not well assessed given the  phase of contrast enhancement. These appear to be relatively stable in size. A large mass is again noted occupying much of the right kidney, with mild sparing of the lower pole of the right kidney. The degree of infiltration of the retroperitoneum anterior to the lumbar spine appears mildly worsened, and the mass thus measures approximately 10.5 x 6.4 x 9.2 cm. Per correlation with recent biopsy results, this most likely reflects urothelial carcinoma. Surrounding soft tissue inflammation and trace fluid is seen. There is new vague fluid attenuation tracking adjacent to the antrum of the stomach and hepatic flexure of the colon, measuring approximately 7.0 x 5.7 x 5.2 cm, which still contains several foci of fat. Given the patient's leukocytosis, mild apparent peripheral enhancement, and the relatively low attenuation of this collection, this is concerning for evolving abscess infiltrating the colonic mesentery. It is likely not sufficiently organized to be fully drainable at this time. There is adjacent wall thickening involving the hepatic flexure of the colon, and surrounding soft tissue inflammation is noted about the omentum. The patient is status post cholecystectomy. Prominence of the common bile duct and intrahepatic biliary ducts is relatively stable from the prior study. The spleen is unremarkable in appearance. The pancreas and adrenal glands are grossly unremarkable, though mass infiltrates about portions of the right adrenal gland. The left kidney is unremarkable in appearance. On the left side, there is no evidence of hydronephrosis. No renal or ureteral stones are seen. The small bowel is grossly unremarkable. The duodenum is decompressed and passes near the collection of fluid. The stomach is largely distended with contrast and air, and is grossly unremarkable. No acute vascular abnormalities are seen. There is scattered calcification along the abdominal aorta and its branches. The patient is status  post appendectomy. Scattered diverticulosis is noted along the sigmoid colon, without evidence of diverticulitis. There is unusual prominence of varices within the retroperitoneum and pelvis. The bladder is mildly distended and grossly unremarkable. The patient is status post hysterectomy. No suspicious adnexal masses are seen. No inguinal lymphadenopathy is seen. No acute osseous abnormalities are identified. Multilevel vacuum phenomenon is noted along the lumbar spine, with mild grade 1 anterolisthesis of L4 on L5, reflecting underlying facet disease. IMPRESSION: 1. New vague fluid collection tracking adjacent to the antrum of the stomach and hepatic flexure of the colon, measuring 7.0 x 5.7 x 5.2 cm, which still contains several foci of fat. Given the patient's leukocytosis, mild apparent peripheral enhancement, and the  relatively low attenuation of this collection, this is concerning for evolving abscess infiltrating the colonic mesentery. It is likely not sufficiently organized to be fully drainable at this time. 2. Adjacent associated wall thickening involving the hepatic flexure of the colon, and surrounding soft tissue inflammation about the omentum. 3. Trace right-sided pleural fluid, new from the prior study, possibly reactive in nature. 4. Large mass again noted occupying much of the right kidney, with mild sparing of the lower pole of the right kidney. There appears to be mildly worsened infiltration of the retroperitoneum anterior to the lumbar spine, and partial infiltration about the right adrenal gland; the mass thus measures approximately 10.5 x 6.4 x 9.2 cm. Per correlation with recent biopsy results, this most likely reflects urothelial carcinoma. 5. Known multiple metastatic lesions within the liver are better characterized on prior CT, with associated perfusion anomalies, not well assessed on this study due to the phase of contrast enhancement. Metastatic lesions appear relatively stable from  the recent prior CT. 6. Scattered coronary artery calcifications seen. 7. Scattered calcification along the abdominal aorta and its branches. 8. Scattered diverticulosis along the sigmoid colon, without evidence of diverticulitis. 9. Unusual prominence of varices within the retroperitoneum and pelvis. 10. Mild degenerative change noted along the lumbar spine. These results were called by telephone at the time of interpretation on 08/20/2015 at 2:32 am to Dr. Veatrice Kells, who verbally acknowledged these results. Electronically Signed   By: Garald Balding M.D.   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 Additionally, she has a biliary leak. She requires percutaneous transhepatic cholangiogram with attempted placement of an internal/ external biliary drainage catheter both for biliary decompression and for bile diversion. EXAM: IR INT-EXT BILIARY DRAIN W/ CHOLANGIOGRAM; SINUS TRACT INJECTION/FISTULOGRAM Date: 08/29/2015 PROCEDURE: 1. Contrast injection through existing drainage catheter 2. Ultrasound-guided puncture of the left biliary tree 3. Placement of an internal/external biliary drainage catheter using fluoroscopic guidance Interventional Radiologist:  Criselda Peaches, MD ANESTHESIA/SEDATION: Moderate (conscious) sedation was used. 4 mg Versed, 150 mcg Fentanyl were administered intravenously. The patient's vital signs were monitored continuously by radiology nursing throughout the procedure. Sedation Time: 30 minutes MEDICATIONS: 3.375 g Zosyn administered intravenously FLUOROSCOPY TIME:  11 minutes 42 seconds for a total of 170 mGy CONTRAST:  54m OMNIPAQUE IOHEXOL 300 MG/ML  SOLN TECHNIQUE: Informed consent was obtained from the patient following explanation of the procedure, risks, benefits and alternatives. The patient understands, agrees and consents for the procedure. All questions were  addressed. A time out was performed. Maximal barrier sterile technique utilized including caps, mask, sterile gowns, sterile gloves, large sterile drape, hand hygiene, and Betadine skin prep. The existing right upper quadrant drainage catheter was gently hand injected with contrast material under real-time fluoroscopic guidance. The contrast material fills a collapsed cavity and then makes a clear fistulous connection with the right posterior biliary tree. There is partial opacification of the dilated right biliary tree. Ultrasound was next used to interrogate the mid epigastric region. The left intrahepatic ducts are significantly dilated. A suitable peripheral segment 3 duct was successfully identified. Local anesthesia was attained by infiltration with 1% lidocaine. A small dermatotomy was made. Under real-time sonographic guidance, the bile duct was punctured with a 21 gauge micropuncture needle. There was return of bile to the needle hub. A gentle hand injection of contrast material confirmed needle placement within the biliary tree. A night  tracks wire was successfully advanced into the dilated common hepatic duct. The Accustick sheath was then advanced over the wire and positioned in the dilated common hepatic duct. Transhepatic cholangiography was then performed. There is marked dilatation of the common hepatic duct. There is a high-grade stenosis bordering on occlusion at the juncture of the common hepatic and common bile ducts. The mid and distal common bile duct are patent in the ampulla is patent. A hydrophilic roadrunner wire was successfully navigated across the obstruction and into the duodenum. The Accustick sheath was then exchanged over the wire. The tract was dilated to 10 [place] and a [place] biliary drainage catheter was advanced over the wire and formed with the locking loop in the duodenum. The proximal side holes extend into the accessed segment 3 duct. Contrast injection through the  tube confirms the tube location. The tube was secured to the skin with 0 Prolene suture and an adhesive fixation device. The tube was connected to gravity bag drainage. COMPLICATIONS: None Estimated blood loss:  0 IMPRESSION: 1. Contrast injection through the existing right upper quadrant abscess drain confirms a fistulous connection with the right posterior intrahepatic biliary ducts. Findings are consistent with small biliary leak. 2. Percutaneous transhepatic cholangiography demonstrates severe intra and extrahepatic biliary ductal dilatation. There is high-grade stenosis bordering on occlusion at the juncture of the common hepatic and common bile ducts. The distal common bile duct and ampulla are patent. 3. Successful placement of an internal/external 10 [place] biliary drainage catheter. PLAN: 1. Maintain biliary drain to bag drainage until bilirubin has significantly trended down. Monitor electrolytes and replete as needed while in-house. The tube should be capped prior to discharge to prevent significant dehydration and electrolyte imbalance. 2. The right upper quadrant biloma drainage catheter should be monitored. When output has ceased, this tube can be removed. If the tube remains in place at the time of discharge patient should follow-up in interventional radiology drain clinic at 1 week following discharge. 3. Patient to undergo upper endoscopy and placement of a duodenal stent by gastroenterology at their . 4. Return to interventional radiology in 1 month for probable placement of an internal covered metal (Wall Flex) stent for continued palliative a decompression of the biliary tree. Signed, Criselda Peaches, MD [facility] Electronically Signed   By: Jacqulynn Cadet M.D.   On: 08/29/2015 16:07   US Biopsy  08/10/2015  INDICATION: Imaging findings compatible with advanced right-sided renal cell carcinoma, now with multiple ill-defined  liver lesions worrisome for metastatic disease. Please perform ultrasound-guided liver lesion biopsy for tissue diagnostic purposes. EXAM: ULTRASOUND GUIDED LIVER LESION BIOPSY COMPARISON:  CT of the chest, abdomen and pelvis - 07/29/2015; abdominal MRI -08/03/2015 MEDICATIONS: Fentanyl 25 mcg IV; Versed 0 point for mg IV ANESTHESIA/SEDATION: Total Moderate Sedation time 15 minutes COMPLICATIONS: None immediate PROCEDURE: Informed written consent was obtained from the patient after a discussion of the risks, benefits and alternatives to treatment. The patient understands and consents the procedure. A timeout was performed prior to the initiation of the procedure. Initial ultrasound scanning of the liver failed to definitively identify any of the ill-defined lesions seen on preceding abdominal CT. Ultimately an approximately 1.6 cm ill-defined hypo attenuating lesion within the caudal aspect of the right lobe of the liver (image 4) correlating with the dominant lesion seen on preceding abdominal CT image 115, series 6, was found sonographically and the procedure was planned. The right upper abdominal quadrant was prepped and  draped in the usual sterile fashion. The overlying soft tissues were anesthetized with 1% lidocaine with epinephrine. A 17 gauge, 6.8 cm co-axial needle was advanced into a peripheral aspect of the lesion. This was followed by 6 core biopsies with an 18 gauge core device under direct ultrasound guidance. The coaxial needle tract was embolized with a small amount of Gel-Foam slurry and superficial hemostasis was obtained with manual compression. Post procedural scanning was negative for definitive area of hemorrhage or additional complication. A dressing was placed. The patient tolerated the procedure well without immediate post procedural complication. IMPRESSION: Technically successful ultrasound guided core needle biopsy of the ill-defined hypoechoic approximately 1.6 cm lesion with the caudal  aspect of the right lobe of the liver. Note, if this biopsy proves nondiagnostic, attempts at establishing a tissue diagnosis could be performed with a CT-guided liver lesion biopsy though I also feel CT guided biopsy would be difficult given the stealth appearance of the hepatic lesions. Electronically Signed   By: Sandi Mariscal M.D.   On: 08/10/2015 12:24   Dg Chest Port 1 View  08/20/2015  CLINICAL DATA:  Acute onset of nausea.  Sepsis.  Initial encounter. EXAM: PORTABLE CHEST 1 VIEW COMPARISON:  Chest radiograph performed 01/22/2010, and CT of the chest performed 08/08/2015 FINDINGS: The lungs are well-aerated. Vascular congestion is noted. Mild bibasilar opacities may reflect mild interstitial edema or possibly pneumonia. There is no evidence of pleural effusion or pneumothorax. The cardiomediastinal silhouette is borderline normal in size. The patient is status post median sternotomy. A valve replacement is noted. No acute osseous abnormalities are seen. IMPRESSION: Vascular congestion noted. Mild bibasilar opacities may reflect mild interstitial edema or possibly pneumonia. Electronically Signed   By: Garald Balding M.D.   On: 08/20/2015 05:38   Dg Abd Portable 1v  08/27/2015  CLINICAL DATA:  82 year old female status post nasogastric tube placement. EXAM: PORTABLE ABDOMEN - 1 VIEW COMPARISON:  Abdominal radiograph 08/22/2015. FINDINGS: Nasogastric tube in position with tip in the body of the stomach. Gas, oral contrast material and stool are seen scattered throughout the colon extending to the level of the distal rectum. No pathologic distension of small bowel is noted. No gross evidence of pneumoperitoneum. Surgical clips are noted in the anatomic pelvis. IMPRESSION: 1. Tip of nasogastric tube is in the body of the stomach. Electronically Signed   By: Vinnie Langton M.D.   On: 08/27/2015 17:33   Dg Abd Portable 2v  09/04/2015  CLINICAL DATA:  abdominal discomfort. Vomiting. Pt had external drain  placed on 1/10 and duodenal stent placed on 1/13. Hx renal cell carcinoma. EXAM: PORTABLE ABDOMEN - 2 VIEW COMPARISON:  08/31/2015 FINDINGS: Bowel gas pattern is unremarkable no evidence of obstruction. Surgical drain projects adjacent to the duodenal stent in the right mid abdomen. Another surgical drain projects lateral to the duodenal stent. There are degenerative changes of the lumbar spine with a levoscoliosis bones are demineralized. IMPRESSION: 1. No evidence of bowel obstruction. 2. Duodenal stent surgical drains are noted, similar to the operative imaging obtained on 08/31/2015. Electronically Signed   By: Lajean Manes M.D.   On: 09/04/2015 14:00   Dg C-arm 1-60 Min  08/31/2015  CLINICAL DATA:  Gastric obstruction. Biliary ductal dilatation. Abscess. Right renal neoplasm. EXAM: DG C-ARM 61-120 MIN; ABDOMEN - 1 VIEW COMPARISON:  None. FINDINGS: Two cross-table lateral fluoroscopic spot images show placement of an internal wall stent in the expected region of the duodenum. Two percutaneous pigtail catheters are also seen  in this region. IMPRESSION: Internal wall stent seen expected region of duodenum. Percutaneous pigtail catheter is also seen in this region. Electronically Signed   By: Earle Gell M.D.   On: 08/31/2015 13:59   Dg C-arm 1-60 Min-no Report  08/28/2015  CLINICAL DATA: duodenal stricture C-ARM 1-60 MINUTES Fluoroscopy was utilized by the requesting physician.  No radiographic interpretation.   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 Additionally, she has a biliary leak. She requires percutaneous transhepatic cholangiogram with attempted placement of an internal/ external biliary drainage catheter both for biliary decompression and for bile diversion. EXAM: IR INT-EXT BILIARY DRAIN W/ CHOLANGIOGRAM; SINUS TRACT INJECTION/FISTULOGRAM Date: 08/29/2015 PROCEDURE: 1. Contrast injection  through existing drainage catheter 2. Ultrasound-guided puncture of the left biliary tree 3. Placement of an internal/external biliary drainage catheter using fluoroscopic guidance Interventional Radiologist:  Criselda Peaches, MD ANESTHESIA/SEDATION: Moderate (conscious) sedation was used. 4 mg Versed, 150 mcg Fentanyl were administered intravenously. The patient's vital signs were monitored continuously by radiology nursing throughout the procedure. Sedation Time: 30 minutes MEDICATIONS: 3.375 g Zosyn administered intravenously FLUOROSCOPY TIME:  11 minutes 42 seconds for a total of 170 mGy CONTRAST:  44m OMNIPAQUE IOHEXOL 300 MG/ML  SOLN TECHNIQUE: Informed consent was obtained from the patient following explanation of the procedure, risks, benefits and alternatives. The patient understands, agrees and consents for the procedure. All questions were addressed. A time out was performed. Maximal barrier sterile technique utilized including caps, mask, sterile gowns, sterile gloves, large sterile drape, hand hygiene, and Betadine skin prep. The existing right upper quadrant drainage catheter was gently hand injected with contrast material under real-time fluoroscopic guidance. The contrast material fills a collapsed cavity and then makes a clear fistulous connection with the right posterior biliary tree. There is partial opacification of the dilated right biliary tree. Ultrasound was next used to interrogate the mid epigastric region. The left intrahepatic ducts are significantly dilated. A suitable peripheral segment 3 duct was successfully identified. Local anesthesia was attained by infiltration with 1% lidocaine. A small dermatotomy was made. Under real-time sonographic guidance, the bile duct was punctured with a 21 gauge micropuncture needle. There was return of bile to the needle hub. A gentle hand injection of contrast material confirmed needle placement within the biliary tree. A night tracks wire was  successfully advanced into the dilated common hepatic duct. The Accustick sheath was then advanced over the wire and positioned in the dilated common hepatic duct. Transhepatic cholangiography was then performed. There is marked dilatation of the common hepatic duct. There is a high-grade stenosis bordering on occlusion at the juncture of the common hepatic and common bile ducts. The mid and distal common bile duct are patent in the ampulla is patent. A hydrophilic roadrunner wire was successfully navigated across the obstruction and into the duodenum. The Accustick sheath was then exchanged over the wire. The tract was dilated to 10 [place] drainage catheter was advanced over the wire and formed with the locking loop in the duodenum. The proximal side holes extend into the accessed segment 3 duct. Contrast injection through the tube confirms the tube location. The tube was secured to the skin with 0 Prolene suture and an adhesive fixation device. The tube was connected to gravity bag drainage. COMPLICATIONS: None Estimated blood loss:  0 IMPRESSION: 1. Contrast injection through the existing right upper quadrant abscess drain confirms a  fistulous connection with the right posterior intrahepatic biliary ducts. Findings are consistent with small biliary leak. 2. Percutaneous transhepatic cholangiography demonstrates severe intra and extrahepatic biliary ductal dilatation. There is high-grade stenosis bordering on occlusion at the juncture of the common hepatic and common bile ducts. The distal common bile duct and ampulla are patent. 3. Successful placement of an internal/external 10 [place] biliary drainage catheter. PLAN: 1. Maintain biliary drain to bag drainage until bilirubin has significantly trended down. Monitor electrolytes and replete as needed while in-house. The tube should be capped prior to discharge to prevent significant dehydration and electrolyte imbalance. 2. The right  upper quadrant biloma drainage catheter should be monitored. When output has ceased, this tube can be removed. If the tube remains in place at the time of discharge patient should follow-up in interventional radiology drain clinic at 1 week following discharge. 3. Patient to undergo upper endoscopy and placement of a duodenal stent by gastroenterology at their . 4. Return to interventional radiology in 1 month for probable placement of an internal covered metal (Wall Flex) stent for continued palliative a decompression of the biliary tree. Signed, Criselda Peaches, MD [facility] Electronically Signed   By: Jacqulynn Cadet M.D.   On: 08/29/2015 16:07   Ct Image Guided Drainage By Percutaneous Catheter  08/28/2015  CLINICAL DATA:  Right Renal neoplasm. Liver lesion, post biopsy 08/10/2015. Postprocedure development of irregular fluid collection inferior to the caudate lobe of the liver. Abdominal pain. Gastric obstruction. EXAM: CT GUIDED DRAINAGE OF PERITONEAL ABSCESS ANESTHESIA/SEDATION: Clip Intravenous Fentanyl and Versed were administered as conscious sedation during continuous cardiorespiratory monitoring by the radiology RN, with a total moderate sedation time of 16 minutes. PROCEDURE: The procedure, risks, benefits, and alternatives were explained to the patient. Questions regarding the procedure were encouraged and answered. The patient understands and consents to the procedure. Select axial scans through the mid abdomen were obtained. The collection was localized and an appropriate skin entry site determined and marked. The operative field was prepped with Betadinein a sterile fashion, and a sterile drape was applied covering the operative field. A sterile gown and sterile gloves were used for the procedure. Local anesthesia was provided with 1% Lidocaine. Under intermittent CT guidance, an 18 gauge percutaneous entry needle was advanced  into the collection. Bilious fluid could be aspirated. A guidewire advanced within the collection, its position confirmed on CT. Tract dilated to facilitate placement of a 12 French pigtail catheter within the collection. A sample of the aspirate was sent for Gram stain, culture and sensitivity. Catheter secured externally with 0 Prolene suture and StatLock and placed to gravity bag. The patient tolerated the procedure well. COMPLICATIONS: None immediate FINDINGS: Limited CT confirms peritoneal fluid collection inferior to the right lobe of the liver and gallbladder fossa. 12French pigtail drain placed under CT guidance. A sample of the Bilious aspirate was sent for Gram stain, culture and sensitivity. IMPRESSION: 1. Technically successful 12 French pigtail drain catheter into right upper quadrant peritoneal collection under CT guidance. Electronically Signed   By: Lucrezia Europe M.D.   On: 08/28/2015 13:46    Microbiology: Recent Results (from the past 240 hour(s))  Culture, blood (routine x 2)     Status: None   Collection Time: 08/27/15 12:55 PM  Result Value Ref Range Status   Specimen Description BLOOD LEFT ARM  Final   Special Requests BOTTLES DRAWN AEROBIC AND ANAEROBIC 10CC  Final   Culture   Final  NO GROWTH 5 DAYS Performed at [facility]    Report Status 09/01/2015 FINAL  Final  Culture, blood (routine x 2)     Status: None   Collection Time: 08/27/15  1:10 PM  Result Value Ref Range Status   Specimen Description BLOOD RIGHT HAND  Final   Special Requests IN PEDIATRIC BOTTLE [place]  Final   Culture   Final    NO GROWTH 5 DAYS Performed at [facility]    Report Status 09/01/2015 FINAL  Final  Culture, Urine     Status: None   Collection Time: 08/28/15 12:25 AM  Result Value Ref Range Status   Specimen Description URINE, RANDOM  Final   Special Requests NONE  Final   Culture   Final    20,000 COLONIES/mL YEAST Performed at [facility]    Report Status  08/29/2015 FINAL  Final  Culture, routine-abscess     Status: None   Collection Time: 08/28/15 12:58 PM  Result Value Ref Range Status   Specimen Description DRAINAGE RUQ CT DRAIN  Final   Special Requests NONE  Final   Gram Stain   Final    NO WBC SEEN NO SQUAMOUS EPITHELIAL CELLS SEEN NO ORGANISMS SEEN Performed at [facility]    Culture   Final    NO GROWTH 3 DAYS Performed at [facility]    Report Status 08/31/2015 FINAL  Final     Labs: Basic Metabolic Panel:  Recent Labs Lab 09/02/15 0515 09/03/15 0454 09/04/15 0526 09/05/15 0447 09/06/15 0443  NA 134* 137 136 134* 135  K 2.9* 3.5 3.7 4.3 4.0  CL 98* 102 101 98* 101  CO2 _0 GLUCOSE 102* 78 82 61* 96  BUN _1 CREATININE 0.97 0.85 0.86 0.86 0.99  CALCIUM 7.9* 8.2* 8.6* 8.4* 9.0  MG  --  1.9 1.9 1.8  --   PHOS  --  1.3* 1.8* 3.2  --    Liver Function Tests:  Recent Labs Lab 09/01/15 0537 09/02/15 0515 09/03/15 0454 09/04/15 0526 09/05/15 0447  AST 41 50* 72* 48* 43*  ALT 60* 59* 73* 64* 53  ALKPHOS 183* 172* 225* 216* 200*  BILITOT 1.3* 1.0 0.8 0.7 0.7  PROT 6.3* 6.3* 6.6 6.7 6.5  ALBUMIN 2.3* 2.4* 2.5* 2.5* 2.3*   No results for input(s): LIPASE, AMYLASE in the last 168 hours. No results for input(s): AMMONIA in the last 168 hours. CBC:  Recent Labs Lab 08/31/15 0500 09/03/15 0454 09/04/15 0526 09/06/15 0443  WBC 9.8 7.3 8.5 7.8  HGB 11.6* 11.1* 11.3* 10.5*  HCT 35.7* 33.4* 34.3* 31.9*  MCV 91.8 89.3 89.8 90.1  PLT 268 302 317 333   Cardiac Enzymes: No results for input(s): CKTOTAL, CKMB, CKMBINDEX, TROPONINI in the last 168 hours. BNP: BNP (last 3 results)  Recent Labs  05/15/15 1115 06/15/15 0955  BNP 111.4* 100.1*    ProBNP (last 3 results) No results for input(s): PROBNP in the last 8760 hours.  CBG: No results for input(s): GLUCAP in the last 168 hours.     Signed:  CHIU, STEPHEN K  [facility] 09/06/2015, 11:56  AM

## 2015-09-06 NOTE — Clinical Social Work Placement (Signed)
Patient is set to discharge to [facility] today. Patient & family at bedside aware. Discharge packet given to RN, Gregary Signs. [facility] called for transport to pickup at 1:30pm.     Raynaldo Opitz, [place] Social Worker cell #: [phone]    CLINICAL SOCIAL WORK PLACEMENT  NOTE  Date:  09/06/2015  Patient Details  Name: Kim Donovan MRN: [id number] Date of Birth: 1928-09-22  Clinical Social Work is seeking post-discharge placement for this patient at the [place] level of care (*CSW will initial, date and re-position this form in  chart as items are completed):  Yes   Patient/family provided with [place] Work Department's list of facilities offering this level of care within the geographic area requested by the patient (or if unable, by the patient's family).  Yes   Patient/family informed of their freedom to choose among providers that offer the needed level of care, that participate in Medicare, Medicaid or managed care program needed by the patient, have an available bed and are willing to accept the patient.  Yes   Patient/family informed of [place]'s ownership interest in [facility] and [facility], as well as of the fact that they are under no obligation to receive care at these facilities.  PASRR submitted to EDS on 08/27/15     PASRR number received on 08/27/15     Existing PASRR number confirmed on       FL2 transmitted to all facilities in geographic area requested by pt/family on 08/27/15     FL2 transmitted to all facilities within larger geographic area on       Patient informed that his/her managed care company has contracts with or will negotiate with certain facilities, including the following:        Yes   Patient/family informed of bed offers received.  Patient chooses bed at [facility]     Physician recommends and patient chooses bed at      Patient to be transferred to  [facility] on 09/06/15.  Patient to be transferred to facility by [facility]     Patient family notified on 09/06/15 of transfer.  Name of family member notified:  family at bedside     PHYSICIAN       Additional Comment:    _______________________________________________ Standley Brooking, [place] 09/06/2015, 12:24 PM

## 2015-09-10 ENCOUNTER — Other Ambulatory Visit: Payer: Self-pay | Admitting: General Surgery

## 2015-09-10 DIAGNOSIS — K831 Obstruction of bile duct: Principal | ICD-10-CM

## 2015-09-10 DIAGNOSIS — C801 Malignant (primary) neoplasm, unspecified: Secondary | ICD-10-CM

## 2015-09-13 ENCOUNTER — Telehealth: Payer: Self-pay | Admitting: *Deleted

## 2015-09-13 ENCOUNTER — Other Ambulatory Visit: Payer: Medicare Other

## 2015-09-13 NOTE — Telephone Encounter (Signed)
Message from Early Osmond at the [facility] reporting they have been unable to obtain Votrient. Discussed with Dr. Benay Spice: Pt has been taken off Votrient. Returned call, spoke with Lattie Haw at [facility]. Pt has not had Votrient the 8 days she has been there. Informed her med has been discontinued.  She will update their record.

## 2015-09-14 ENCOUNTER — Telehealth: Payer: Self-pay | Admitting: Oncology

## 2015-09-14 ENCOUNTER — Other Ambulatory Visit (HOSPITAL_BASED_OUTPATIENT_CLINIC_OR_DEPARTMENT_OTHER): Payer: Medicare Other

## 2015-09-14 ENCOUNTER — Other Ambulatory Visit: Payer: Self-pay | Admitting: *Deleted

## 2015-09-14 ENCOUNTER — Ambulatory Visit (HOSPITAL_BASED_OUTPATIENT_CLINIC_OR_DEPARTMENT_OTHER): Payer: Medicare Other | Admitting: Oncology

## 2015-09-14 VITALS — BP 141/81 | HR 97 | Temp 97.7°F | Resp 18 | Wt 135.1 lb

## 2015-09-14 DIAGNOSIS — C641 Malignant neoplasm of right kidney, except renal pelvis: Secondary | ICD-10-CM | POA: Diagnosis not present

## 2015-09-14 DIAGNOSIS — R634 Abnormal weight loss: Secondary | ICD-10-CM | POA: Diagnosis not present

## 2015-09-14 DIAGNOSIS — C787 Secondary malignant neoplasm of liver and intrahepatic bile duct: Secondary | ICD-10-CM

## 2015-09-14 DIAGNOSIS — C7910 Secondary malignant neoplasm of unspecified urinary organs: Secondary | ICD-10-CM | POA: Insufficient documentation

## 2015-09-14 LAB — CBC WITH DIFFERENTIAL/PLATELET
BASO%: 0.6 % (ref 0.0–2.0)
BASOS ABS: 0.1 10*3/uL (ref 0.0–0.1)
EOS%: 2.8 % (ref 0.0–7.0)
Eosinophils Absolute: 0.2 10*3/uL (ref 0.0–0.5)
HCT: 34.1 % — ABNORMAL LOW (ref 34.8–46.6)
HGB: 11.3 g/dL — ABNORMAL LOW (ref 11.6–15.9)
LYMPH#: 1.3 10*3/uL (ref 0.9–3.3)
LYMPH%: 15 % (ref 14.0–49.7)
MCH: 30 pg (ref 25.1–34.0)
MCHC: 33.1 g/dL (ref 31.5–36.0)
MCV: 90.7 fL (ref 79.5–101.0)
MONO#: 0.7 10*3/uL (ref 0.1–0.9)
MONO%: 7.5 % (ref 0.0–14.0)
NEUT#: 6.6 10*3/uL — ABNORMAL HIGH (ref 1.5–6.5)
NEUT%: 74.1 % (ref 38.4–76.8)
Platelets: 369 10*3/uL (ref 145–400)
RBC: 3.76 10*6/uL (ref 3.70–5.45)
RDW: 16.1 % — ABNORMAL HIGH (ref 11.2–14.5)
WBC: 8.9 10*3/uL (ref 3.9–10.3)

## 2015-09-14 LAB — COMPREHENSIVE METABOLIC PANEL
ALT: 101 U/L — AB (ref 0–55)
AST: 79 U/L — AB (ref 5–34)
Albumin: 2.8 g/dL — ABNORMAL LOW (ref 3.5–5.0)
Alkaline Phosphatase: 460 U/L — ABNORMAL HIGH (ref 40–150)
Anion Gap: 9 mEq/L (ref 3–11)
BUN: 18.2 mg/dL (ref 7.0–26.0)
CALCIUM: 9.9 mg/dL (ref 8.4–10.4)
CHLORIDE: 102 meq/L (ref 98–109)
CO2: 29 meq/L (ref 22–29)
CREATININE: 1.1 mg/dL (ref 0.6–1.1)
EGFR: 44 mL/min/{1.73_m2} — ABNORMAL LOW (ref >90)
GLUCOSE: 121 mg/dL (ref 70–140)
POTASSIUM: 4.4 meq/L (ref 3.5–5.1)
SODIUM: 140 meq/L (ref 136–145)
Total Bilirubin: 0.54 mg/dL (ref 0.20–1.20)
Total Protein: 8 g/dL (ref 6.4–8.3)

## 2015-09-14 LAB — TSH: TSH: 1.663 m[IU]/L (ref 0.308–3.960)

## 2015-09-14 NOTE — Telephone Encounter (Signed)
Pt confirmed labs/ov per 01/27 POF, gave pt AVS and Calendar... KJ

## 2015-09-14 NOTE — Progress Notes (Signed)
  [facility] OFFICE PROGRESS NOTE   Diagnosis: Urothelial carcinoma  INTERVAL HISTORY:   Kim Donovan returns for scheduled visit. She was discharged from the hospital 09/06/2015 after a lengthy admission with a biliary leak and small bowel obstruction by tumor. She underwent placement of a biliary drain and duodenal stent while in the hospital. She reports minimal drainage from the open right upper quadrant drain. She is tolerating a soft diet. No nausea or vomiting. No pain. She is participated in daily physical therapy. Kim Donovan says she is not back to her baseline functional status.  The pathology from the liver biopsy was sent to the [facility] for further review. They agreed with a diagnosis of metastatic urothelial carcinoma.   Objective:  Vital signs in last 24 hours:  Blood pressure 141/81, pulse 97, temperature 97.7 F (36.5 C), temperature source Oral, resp. rate 18, weight 135 lb 1.6 oz (61.281 kg), SpO2 97 %.    HEENT: Neck without mass Resp: Lungs clear bilaterally Cardio: Regular rate and rhythm GI: No hepatosplenomegaly, mild tenderness in the right upper quadrant, drain sites covered with gauze dressing Vascular: Trace edema at the right greater than left lower leg    Lab Results:  Lab Results  Component Value Date   WBC 8.9 09/14/2015   HGB 11.3* 09/14/2015   HCT 34.1* 09/14/2015   MCV 90.7 09/14/2015   PLT 369 09/14/2015   NEUTROABS 6.6* 09/14/2015   BUN 18.2, cracking 1.1, alk phosphatase 460, AST 79, ALT 101, bilirubin 0.54  Medications: I have reviewed the patient's current medications.  Assessment/Plan: 1. Right renal mass  MRI 08/03/2015 with multiple suspicious liver lesions, replacement of the right kidney consistent with an infiltrative neoplasm, probable rectoperineal adenopathy, extension of tumor into the IVC, small right pleural effusion  Staging CTs 08/08/2015 with no lung metastases, infiltrative right renal mass  extending into the right renal vein and pararenal space, no lymphadenopathy, liver metastases  Ultrasound-guided biopsy of a right liver lesion 08/10/2015 with the preliminary pathology consistent with metastatic renal cell carcinoma, additional immunohistochemical histochemical stains most consistent with a poorly differentiated urothelial carcinoma  Initiation of pazopanib 08/17/2015, taken for 3 days 2. Right abdomen/flank discomfort secondary to #1  3. Family history of renal cell cancer  4. Anorexia/weight loss secondary to #1  5. Status post aortic valve replacement  6.Polymyalgia rheumatica- [place] on hold  7. Admission 08/20/2015 with nausea/vomiting and fever - CT abdomen/pelvis revealed new fluid attenuation surrounding the gastric antrum and hepatic flexure, stable fluid collection on a repeat CT 08/23/2015  placement of a drainage catheter 08/28/2015 with bilious fluid obtained  8. Probable aspiration pneumonia January 2017  9. Duodenal obstruction noted on endoscopy 08/28/2015  10.   Placement of an internal/external biliary drain 08/29/2015    Disposition:  Kim Donovan has an improved performance status. She continues daily physical therapy and is increasing her diet. She has not returned to the prehospital admission baseline. She has been diagnosed with metastatic urothelial carcinoma. We discussed treatment options with chemotherapy versus immunotherapy. Her performance status is borderline to receive chemotherapy at present. We decided to wait until she returns on 09/25/2015 to decide on treatment.  She is scheduled for an appointment in interventional radiology next week. The liver enzymes are mildly elevated today.  Betsy Coder, MD  09/14/2015  11:37 AM

## 2015-09-17 ENCOUNTER — Telehealth: Payer: Self-pay | Admitting: Oncology

## 2015-09-17 NOTE — Telephone Encounter (Signed)
Left message for patient re lab for 1/31 @ 9:15 am.

## 2015-09-18 ENCOUNTER — Other Ambulatory Visit: Payer: Self-pay | Admitting: Interventional Radiology

## 2015-09-18 ENCOUNTER — Other Ambulatory Visit (HOSPITAL_BASED_OUTPATIENT_CLINIC_OR_DEPARTMENT_OTHER): Payer: Medicare Other

## 2015-09-18 ENCOUNTER — Ambulatory Visit
Admission: RE | Admit: 2015-09-18 | Discharge: 2015-09-18 | Disposition: A | Discharge status: Home / Self Care | Payer: Medicare Other | Source: Ambulatory Visit | Attending: General Surgery | Admitting: General Surgery

## 2015-09-18 ENCOUNTER — Other Ambulatory Visit (HOSPITAL_COMMUNITY): Payer: Self-pay | Admitting: Interventional Radiology

## 2015-09-18 ENCOUNTER — Ambulatory Visit
Admission: RE | Admit: 2015-09-18 | Discharge: 2015-09-18 | Disposition: A | Discharge status: Home / Self Care | Payer: Medicare Other | Source: Ambulatory Visit | Attending: Radiology | Admitting: Radiology

## 2015-09-18 DIAGNOSIS — R634 Abnormal weight loss: Secondary | ICD-10-CM | POA: Diagnosis not present

## 2015-09-18 DIAGNOSIS — C641 Malignant neoplasm of right kidney, except renal pelvis: Secondary | ICD-10-CM

## 2015-09-18 DIAGNOSIS — K831 Obstruction of bile duct: Secondary | ICD-10-CM

## 2015-09-18 DIAGNOSIS — C801 Malignant (primary) neoplasm, unspecified: Secondary | ICD-10-CM

## 2015-09-18 DIAGNOSIS — C787 Secondary malignant neoplasm of liver and intrahepatic bile duct: Secondary | ICD-10-CM | POA: Diagnosis not present

## 2015-09-18 HISTORY — PX: IR GENERIC HISTORICAL: IMG1180011

## 2015-09-18 LAB — CBC WITH DIFFERENTIAL/PLATELET
BASO%: 0.5 % (ref 0.0–2.0)
BASOS ABS: 0 10*3/uL (ref 0.0–0.1)
EOS%: 5.8 % (ref 0.0–7.0)
Eosinophils Absolute: 0.5 10*3/uL (ref 0.0–0.5)
HEMATOCRIT: 32.5 % — AB (ref 34.8–46.6)
HEMOGLOBIN: 10.6 g/dL — AB (ref 11.6–15.9)
LYMPH#: 1.5 10*3/uL (ref 0.9–3.3)
LYMPH%: 17.5 % (ref 14.0–49.7)
MCH: 30 pg (ref 25.1–34.0)
MCHC: 32.6 g/dL (ref 31.5–36.0)
MCV: 92.1 fL (ref 79.5–101.0)
MONO#: 0.8 10*3/uL (ref 0.1–0.9)
MONO%: 9 % (ref 0.0–14.0)
NEUT%: 67.2 % (ref 38.4–76.8)
NEUTROS ABS: 5.8 10*3/uL (ref 1.5–6.5)
Platelets: 343 10*3/uL (ref 145–400)
RBC: 3.53 10*6/uL — ABNORMAL LOW (ref 3.70–5.45)
RDW: 16.6 % — AB (ref 11.2–14.5)
WBC: 8.6 10*3/uL (ref 3.9–10.3)

## 2015-09-18 LAB — COMPREHENSIVE METABOLIC PANEL
ALBUMIN: 2.9 g/dL — AB (ref 3.5–5.0)
ALK PHOS: 319 U/L — AB (ref 40–150)
ALT: 42 U/L (ref 0–55)
AST: 36 U/L — AB (ref 5–34)
Anion Gap: 11 mEq/L (ref 3–11)
BILIRUBIN TOTAL: 0.39 mg/dL (ref 0.20–1.20)
BUN: 22.5 mg/dL (ref 7.0–26.0)
CALCIUM: 10 mg/dL (ref 8.4–10.4)
CO2: 26 mEq/L (ref 22–29)
CREATININE: 1 mg/dL (ref 0.6–1.1)
Chloride: 102 mEq/L (ref 98–109)
EGFR: 53 mL/min/{1.73_m2} — ABNORMAL LOW (ref >90)
GLUCOSE: 74 mg/dL (ref 70–140)
POTASSIUM: 4.6 meq/L (ref 3.5–5.1)
SODIUM: 139 meq/L (ref 136–145)
TOTAL PROTEIN: 8 g/dL (ref 6.4–8.3)

## 2015-09-18 LAB — TSH: TSH: 2.427 m[IU]/L (ref 0.308–3.960)

## 2015-09-18 MED ORDER — IOPAMIDOL (ISOVUE-300) INJECTION 61%
80.0000 mL | Freq: Once | INTRAVENOUS | Status: AC | PRN
  Administered 2015-09-18: 80 mL via INTRAVENOUS

## 2015-09-18 NOTE — Progress Notes (Signed)
Chief Complaint: Patient was seen in consultation today for No chief complaint on file.  at the request of Allred,Darrell K  Referring Physician(s): Allred,Darrell K  History of Present Illness: Kim Donovan is a 82 y.o. female who had a perihepatic abscess drain, left internal/external biliary drain, and duodenal stent placed in recent weeks. The biliary drain is capped. There has been no significant output from the abscess drain over the last few days. She denies fevers or chills. She denies nausea or vomiting.  Past Medical History  Diagnosis Date  . Hyperlipidemia   . Osteoarthritis   . Osteoporosis/osteopenia increased risk   . Glaucoma   . Aortic stenosis     myoview neg ischemia 11/2006  ---- echo 11/10: mean AVA gradient~47   ---- s/p bioprosthetic AVR 12/2009  . Polymyalgia rheumatica ([place]) 11/05/2010  . PVD (peripheral vascular disease) ([facility])     left leg swells  . Hypertension     pt. states does not have HTN, no meds  . Renal cell carcinoma ([place]) 07/2016    with liver mets, liver biopsy confirmed urothelial origin.     Past Surgical History  Procedure Laterality Date  . Appendectomy    . Tonsillectomy    . Cholecystectomy  1958  . Cardiac valve replacement  May 2011    aortic valve  [facility]  . Carpal tunnel release  12/2010    left hand  . Cataract/glaucoma surgery  11/2010    [facility]  . Breast surgery  1985    [facility]  . Abdominal hysterectomy      for fibroids  . Mastectomy      was only for removal of cysts, benign  . Eye surgery  01/2011    for glaucoma  . Carpal tunnel release  08/29/2011    Procedure: CARPAL TUNNEL RELEASE;  Surgeon: Cammie Sickle., MD;  Location: [facility];  Service: Orthopedics;  Laterality: Right;  . Esophagogastroduodenoscopy (egd) with propofol N/A 08/28/2015    Procedure: ESOPHAGOGASTRODUODENOSCOPY (EGD) WITH PROPOFOL;  Surgeon: Ladene Artist, MD;  Location: [facility];  Service: Endoscopy;   Laterality: N/A;  . Esophagogastroduodenoscopy N/A 08/31/2015    Procedure: ESOPHAGOGASTRODUODENOSCOPY (EGD), with stent;  Surgeon: Jerene Bears, MD;  Location: [facility];  Service: Gastroenterology;  Laterality: N/A;    Allergies: Tramadol  Medications: Prior to Admission medications   Medication Sig Start Date End Date Taking? Authorizing Provider  ALPRAZolam (XANAX) 0.25 MG tablet Take 1 tablet (0.25 mg total) by mouth at bedtime as needed for anxiety. 09/06/15   Donne Hazel, MD  aspirin 81 MG tablet Take 81 mg by mouth daily.      Historical Provider, MD  Calcium Citrate-Vitamin D 250-200 MG-UNIT TABS Take 2 tablets by mouth daily.     Historical Provider, MD  Diphenhydramine-APAP, sleep, (TYLENOL PM EXTRA STRENGTH PO) Take 1 tablet by mouth at bedtime as needed (pain/sleep).     Historical Provider, MD  dorzolamide-timolol (COSOPT) 22.3-6.8 MG/ML ophthalmic solution Place 1 drop into the left eye 2 (two) times daily.     Historical Provider, MD  dronabinol (MARINOL) 2.5 MG capsule Take 1 capsule (2.5 mg total) by mouth 2 (two) times daily before lunch and supper. 09/06/15   Donne Hazel, MD  ezetimibe (ZETIA) 10 MG tablet Take 1 tablet (10 mg total) by mouth daily. 05/12/14   Jolaine Artist, MD  furosemide (LASIX) 20 MG tablet Take 1 tablet (20 mg total)  by mouth daily. 05/15/15   Larey Dresser, MD  Multiple Vitamins-Minerals (CENTRUM SILVER ULTRA WOMENS PO) Take 1 tablet by mouth daily.      Historical Provider, MD  Omega-3 Fatty Acids (FISH OIL) 1000 MG CAPS Take 1 capsule by mouth daily. Reported on 08/06/2015    Historical Provider, MD  ondansetron (ZOFRAN) 4 MG tablet Take 1 tablet (4 mg total) by mouth every 6 (six) hours as needed for nausea. 09/06/15   Donne Hazel, MD  pantoprazole (PROTONIX) 40 MG tablet Take 1 tablet (40 mg total) by mouth daily. 09/06/15   Donne Hazel, MD     Family History  Problem Relation Age of Onset  . Deep vein thrombosis Mother   .  Heart attack Father   . Stroke Father   . Osteoporosis      aunt    Social History   Social History  . Marital Status: Married    Spouse Name: N/A  . Number of Children: N/A  . Years of Education: N/A   Social History Main Topics  . Smoking status: Former Smoker -- 0.50 packs/day    Types: Cigarettes    Quit date: 08/18/1978  . Smokeless tobacco: Not on file  . Alcohol Use: No  . Drug Use: Not on file  . Sexual Activity: Not on file   Other Topics Concern  . Not on file   Social History Narrative   Married   Walks   HH of 2   No pets   Social ocass etoh              Review of Systems: A 12 point ROS discussed and pertinent positives are indicated in the HPI above.  All other systems are negative.  Review of Systems  Vital Signs: There were no vitals taken for this visit.  Physical Exam  Constitutional: She appears well-developed and well-nourished.  Abdominal:  The right upper quadrant perihepatic abscess drain site is clean and dry without signs of infection. Abdomen is soft and nontender.    Mallampati Score:     Imaging: Ct Abdomen Wo Contrast  08/27/2015  CLINICAL DATA:  Abscess EXAM: CT ABDOMEN WITHOUT CONTRAST TECHNIQUE: Multidetector CT imaging of the abdomen was performed following the standard protocol without IV contrast. COMPARISON:  08/23/2015 FINDINGS: The stomach is severely distended with fluid. The ill-defined fluid collection in the right lower quadrant measure 6.1 x 5.3 cm. It has slightly enlarged since the prior study. Biliary dilatation persists. Tumor infiltration of the right kidney is unchanged. No left hydronephrosis. Small right pleural effusion is stable. Patchy airspace disease at the left lung base has slightly increased. No free-fluid in the abdomen.  No free intraperitoneal gas. IMPRESSION: The stomach is severely distended. This presents an aspiration risk with moderate sedation. NG tube decompression of the stomach is  recommended prior to abscess drainage. The fluid collection in question within the right lower quadrant has slightly enlarged. Stable biliary dilatation. Increasing patchy airspace disease at the left lung base. Consider aspiration pneumonia. Electronically Signed   By: Marybelle Killings M.D.   On: 08/27/2015 16:17   Dg Abd 1 View - Kub  08/31/2015  CLINICAL DATA:  Gastric obstruction. Biliary ductal dilatation. Abscess. Right renal neoplasm. EXAM: DG C-ARM 61-120 MIN; ABDOMEN - 1 VIEW COMPARISON:  None. FINDINGS: Two cross-table lateral fluoroscopic spot images show placement of an internal wall stent in the expected region of the duodenum. Two percutaneous pigtail catheters are also seen in  this region. IMPRESSION: Internal wall stent seen expected region of duodenum. Percutaneous pigtail catheter is also seen in this region. Electronically Signed   By: Earle Gell M.D.   On: 08/31/2015 13:59   Dg Abd 1 View  08/28/2015  CLINICAL DATA:  Biliary obstruction. EXAM: ABDOMEN - 1 VIEW COMPARISON:  CT 08/28/2015. FINDINGS: Endoscopic tube noted. No contrast noted in the biliary system, by history patient has a duodenum stricture that did not allow cannulation of the biliary ductal system. NG tube noted . Right upper quadrant catheter noted. IMPRESSION: No contrast in the biliary system. By history patient has a duodenum stricture not allowing cannulation of the biliary system. Electronically Signed   By: Marcello Moores  Register   On: 08/28/2015 14:49   Dg Abd 1 View  08/22/2015  CLINICAL DATA:  Nausea and vomiting multiple episodes last night EXAM: ABDOMEN - 1 VIEW COMPARISON:  08/20/2015 and 08/08/2015 FINDINGS: There is normal small bowel gas pattern. Levoscoliosis and degenerative changes are noted lumbar spine. There is moderate gastric distension with gas. Findings suspicious for gastroparesis or gastric outlet obstruction. IMPRESSION: Normal small bowel gas pattern. Moderate gaseous distension of the stomach. Findings  suspicious for gastroparesis or gastric outlet obstruction. Electronically Signed   By: Lahoma Crocker M.D.   On: 08/22/2015 11:26   Ct Abdomen Pelvis W Contrast  09/18/2015  CLINICAL DATA:  Perihepatic abscess EXAM: CT ABDOMEN AND PELVIS WITH CONTRAST TECHNIQUE: Multidetector CT imaging of the abdomen and pelvis was performed using the standard protocol following bolus administration of intravenous contrast. CONTRAST:  75mL ISOVUE-300 IOPAMIDOL (ISOVUE-300) INJECTION 61% COMPARISON:  08/23/2015 FINDINGS: A perihepatic abscess strain has been placed. See image 35. The abscess noted on prior study has completely resolved. A left internal external biliary drain has been placed with its tip coiled in the duodenum. Biliary tree has somewhat decompressed since the prior study A duodenal stent is in place extending from the bulb to the distal second portion. The stent is patent. Stomach is decompressed. Large heterogeneous and low density mass in the right renal fossa is stable. There may be associated occlusion of the IVC. Multiple liver lesions primarily in the right lobe are not significantly changed. Right pleural effusion has resolved. Heterogeneous opacities at the left lung base have improved. No new abscess.  No free intraperitoneal gas. Left kidney, spleen, pancreas, and left adrenal gland are stable in appearance. Mild ductal dilatation within the pancreas is stable. IMPRESSION: Status post right perihepatic abscess drain. The abscess has resolved. Left internal external biliary drain has been placed. Biliary dilatation has improved. No new abscess. Duodenal stent placement.  Stomach is decompressed. Right pleural effusion resolved. Pulmonary opacities at the left base improved. Electronically Signed   By: Marybelle Killings M.D.   On: 09/18/2015 11:05   Ct Abdomen Pelvis W Contrast  08/24/2015  ADDENDUM REPORT: 08/24/2015 11:26 ADDENDUM: I reviewed the patient's prior CT scan from 2008. This study demonstrated mild  intrahepatic biliary dilatation and moderate extra hepatic biliary dilatation with the common bile duct measuring approximately 11 mm. The findings on the recent CT scans are most likely due to progressive intrahepatic biliary dilatation due to the patient's age and prior cholecystectomy. There may be some extrinsic compression of the intrapancreatic portion of the common bile duct due to the right renal mass and perinephric fluid. No obvious intraductal mass, pancreatic mass or adenopathy. Patient's liver function studies are normal. Attention on future scans is suggested. Electronically Signed   By: Marijo Sanes  M.D.   On: 08/24/2015 11:26  08/24/2015  CLINICAL DATA:  Followup diverticulosis.  Improving abdominal pain. EXAM: CT ABDOMEN AND PELVIS WITH CONTRAST TECHNIQUE: Multidetector CT imaging of the abdomen and pelvis was performed using the standard protocol following bolus administration of intravenous contrast. CONTRAST:  190mL OMNIPAQUE IOHEXOL 300 MG/ML  SOLN COMPARISON:  Prior CT scans 08/20/2015 and 08/08/2015. Prior MRI examination 08/03/2015 FINDINGS: Lower chest: A small to moderate-sized right pleural effusion is noted. This has enlarged since the prior CT scan. There is overlying atelectasis. Patchy left lower lobe infiltrate and small left effusion. The heart is mildly enlarged. Prosthetic aortic valve is noted. Coronary artery calcifications are again demonstrated. Hepatobiliary: Stable significant intrahepatic biliary dilatation without abrupt cut off afford enters the pancreatic head. Findings could be due to a tight stricture or biliary tumor. The common bile duct in the pancreatic head is normal in caliber in the main pancreatic duct is normal in caliber. Again demonstrated are stable hepatic metastatic lesions. The portal and hepatic veins are patent. Pancreas: No mass or inflammation. Spleen: Normal size.  No focal lesions. Adrenals/Urinary Tract: Stable diffusely infiltrated right kidney,  most likely by tumor. Chronic infection is also possible. Tumor/clot noted in a dilated right renal vein. Stomach/Bowel: The stomach, duodenum, small bowel and colon are grossly normal. Vascular/Lymphatic: Tortuosity, ectasia and calcification of the thoracic aorta. The branch vessels are patent. Stable mesenteric and retroperitoneal lymph nodes. Other: Stable complex fluid collection in the mesentery surrounded by small bowel loops and vessels. There is also moderate free pelvic fluid. The bladder appears normal. No pelvic mass or adenopathy. No inguinal mass or adenopathy. Musculoskeletal: No significant bony findings. Scoliosis and degenerative changes noted in the spine. IMPRESSION: 1. Overall stable CT findings in the abdomen/pelvis. 2. Enlarging right pleural effusion and worsening left lower lobe infiltrate. 3. Stable hepatic metastatic lesions. 4. Stable intrahepatic biliary dilatation with an abrupt cut off of the common bile duct most likely due to a tight stricture or cholangiocarcinoma. 5. Diffusely infiltrated right kidney. This could be an infiltrating renal cell carcinoma or transitional cell carcinoma. 6. Stable complex fluid collection in the small bowel mesenteric. Slight increase in pelvic fluid. Electronically Signed: By: Marijo Sanes M.D. On: 08/23/2015 17:41   Ct Abdomen Pelvis W Contrast  08/20/2015  CLINICAL DATA:  Acute onset of nausea, vomiting and fever. Recent liver biopsy on August 10, 2015. Initial encounter. EXAM: CT ABDOMEN AND PELVIS WITH CONTRAST TECHNIQUE: Multidetector CT imaging of the abdomen and pelvis was performed using the standard protocol following bolus administration of intravenous contrast. CONTRAST:  26mL OMNIPAQUE IOHEXOL 300 MG/ML  SOLN COMPARISON:  CT of the chest, abdomen and pelvis performed 08/08/2015, and MRI of the abdomen performed 08/03/2015 FINDINGS: A trace right-sided pleural fluid is noted, new from the prior study, possibly reactive in nature.  Scattered coronary artery calcifications are noted. The known multiple metastatic lesions within the liver are better characterized on prior CT; associated perfusion anomalies are not well assessed given the phase of contrast enhancement. These appear to be relatively stable in size. A large mass is again noted occupying much of the right kidney, with mild sparing of the lower pole of the right kidney. The degree of infiltration of the retroperitoneum anterior to the lumbar spine appears mildly worsened, and the mass thus measures approximately 10.5 x 6.4 x 9.2 cm. Per correlation with recent biopsy results, this most likely reflects urothelial carcinoma. Surrounding soft tissue inflammation and trace fluid is seen. There  is new vague fluid attenuation tracking adjacent to the antrum of the stomach and hepatic flexure of the colon, measuring approximately 7.0 x 5.7 x 5.2 cm, which still contains several foci of fat. Given the patient's leukocytosis, mild apparent peripheral enhancement, and the relatively low attenuation of this collection, this is concerning for evolving abscess infiltrating the colonic mesentery. It is likely not sufficiently organized to be fully drainable at this time. There is adjacent wall thickening involving the hepatic flexure of the colon, and surrounding soft tissue inflammation is noted about the omentum. The patient is status post cholecystectomy. Prominence of the common bile duct and intrahepatic biliary ducts is relatively stable from the prior study. The spleen is unremarkable in appearance. The pancreas and adrenal glands are grossly unremarkable, though mass infiltrates about portions of the right adrenal gland. The left kidney is unremarkable in appearance. On the left side, there is no evidence of hydronephrosis. No renal or ureteral stones are seen. The small bowel is grossly unremarkable. The duodenum is decompressed and passes near the collection of fluid. The stomach is  largely distended with contrast and air, and is grossly unremarkable. No acute vascular abnormalities are seen. There is scattered calcification along the abdominal aorta and its branches. The patient is status post appendectomy. Scattered diverticulosis is noted along the sigmoid colon, without evidence of diverticulitis. There is unusual prominence of varices within the retroperitoneum and pelvis. The bladder is mildly distended and grossly unremarkable. The patient is status post hysterectomy. No suspicious adnexal masses are seen. No inguinal lymphadenopathy is seen. No acute osseous abnormalities are identified. Multilevel vacuum phenomenon is noted along the lumbar spine, with mild grade 1 anterolisthesis of L4 on L5, reflecting underlying facet disease. IMPRESSION: 1. New vague fluid collection tracking adjacent to the antrum of the stomach and hepatic flexure of the colon, measuring 7.0 x 5.7 x 5.2 cm, which still contains several foci of fat. Given the patient's leukocytosis, mild apparent peripheral enhancement, and the relatively low attenuation of this collection, this is concerning for evolving abscess infiltrating the colonic mesentery. It is likely not sufficiently organized to be fully drainable at this time. 2. Adjacent associated wall thickening involving the hepatic flexure of the colon, and surrounding soft tissue inflammation about the omentum. 3. Trace right-sided pleural fluid, new from the prior study, possibly reactive in nature. 4. Large mass again noted occupying much of the right kidney, with mild sparing of the lower pole of the right kidney. There appears to be mildly worsened infiltration of the retroperitoneum anterior to the lumbar spine, and partial infiltration about the right adrenal gland; the mass thus measures approximately 10.5 x 6.4 x 9.2 cm. Per correlation with recent biopsy results, this most likely reflects urothelial carcinoma. 5. Known multiple metastatic lesions within  the liver are better characterized on prior CT, with associated perfusion anomalies, not well assessed on this study due to the phase of contrast enhancement. Metastatic lesions appear relatively stable from the recent prior CT. 6. Scattered coronary artery calcifications seen. 7. Scattered calcification along the abdominal aorta and its branches. 8. Scattered diverticulosis along the sigmoid colon, without evidence of diverticulitis. 9. Unusual prominence of varices within the retroperitoneum and pelvis. 10. Mild degenerative change noted along the lumbar spine. These results were called by telephone at the time of interpretation on 08/20/2015 at 2:32 am to Dr. Veatrice Kells, who verbally acknowledged these results. Electronically Signed   By: Garald Balding M.D.   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 Additionally, she has a biliary leak. She requires percutaneous transhepatic cholangiogram with attempted placement of an internal/ external biliary drainage catheter both for biliary decompression and for bile diversion. EXAM: IR INT-EXT BILIARY DRAIN W/ CHOLANGIOGRAM; SINUS TRACT INJECTION/FISTULOGRAM Date: 08/29/2015 PROCEDURE: 1. Contrast injection through existing drainage catheter 2. Ultrasound-guided puncture of the left biliary tree 3. Placement of an internal/external biliary drainage catheter using fluoroscopic guidance Interventional Radiologist:  Criselda Peaches, MD ANESTHESIA/SEDATION: Moderate (conscious) sedation was used. 4 mg Versed, 150 mcg Fentanyl were administered intravenously. The patient's vital signs were monitored continuously by radiology nursing throughout the procedure. Sedation Time: 30 minutes MEDICATIONS: 3.375 g Zosyn administered intravenously FLUOROSCOPY TIME:  11 minutes 42 seconds for a total of 170 mGy CONTRAST:  68mL OMNIPAQUE IOHEXOL 300 MG/ML  SOLN TECHNIQUE:  Informed consent was obtained from the patient following explanation of the procedure, risks, benefits and alternatives. The patient understands, agrees and consents for the procedure. All questions were addressed. A time out was performed. Maximal barrier sterile technique utilized including caps, mask, sterile gowns, sterile gloves, large sterile drape, hand hygiene, and Betadine skin prep. The existing right upper quadrant drainage catheter was gently hand injected with contrast material under real-time fluoroscopic guidance. The contrast material fills a collapsed cavity and then makes a clear fistulous connection with the right posterior biliary tree. There is partial opacification of the dilated right biliary tree. Ultrasound was next used to interrogate the mid epigastric region. The left intrahepatic ducts are significantly dilated. A suitable peripheral segment 3 duct was successfully identified. Local anesthesia was attained by infiltration with 1% lidocaine. A small dermatotomy was made. Under real-time sonographic guidance, the bile duct was punctured with a 21 gauge micropuncture needle. There was return of bile to the needle hub. A gentle hand injection of contrast material confirmed needle placement within the biliary tree. A night tracks wire was successfully advanced into the dilated common hepatic duct. The Accustick sheath was then advanced over the wire and positioned in the dilated common hepatic duct. Transhepatic cholangiography was then performed. There is marked dilatation of the common hepatic duct. There is a high-grade stenosis bordering on occlusion at the juncture of the common hepatic and common bile ducts. The mid and distal common bile duct are patent in the ampulla is patent. A hydrophilic roadrunner wire was successfully navigated across the obstruction and into the duodenum. The Accustick sheath was then exchanged over the wire. The tract was dilated to 10 [place] and a [place] biliary drainage catheter was advanced over the wire and formed with the locking loop in the duodenum. The proximal side holes extend into the accessed segment 3 duct. Contrast injection through the tube confirms the tube location. The tube was secured to the skin with 0 Prolene suture and an adhesive fixation device. The tube was connected to gravity bag drainage. COMPLICATIONS: None Estimated blood loss:  0 IMPRESSION: 1. Contrast injection through the existing right upper quadrant abscess drain confirms a fistulous connection with the right posterior intrahepatic biliary ducts. Findings are consistent with small biliary leak. 2. Percutaneous transhepatic cholangiography demonstrates severe intra and extrahepatic biliary ductal dilatation. There is high-grade stenosis bordering on occlusion at the juncture of the common hepatic and common bile ducts. The distal common bile duct and ampulla are patent. 3. Successful placement of an internal/external 10 [place] biliary drainage catheter. PLAN:  1. Maintain biliary drain to bag drainage until bilirubin has significantly trended down. Monitor electrolytes and replete as needed while in-house. The tube should be capped prior to discharge to prevent significant dehydration and electrolyte imbalance. 2. The right upper quadrant biloma drainage catheter should be monitored. When output has ceased, this tube can be removed. If the tube remains in place at the time of discharge patient should follow-up in interventional radiology drain clinic at 1 week following discharge. 3. Patient to undergo upper endoscopy and placement of a duodenal stent by gastroenterology at their . 4. Return to interventional radiology in 1 month for probable placement of an internal covered metal (Wall Flex) stent for continued palliative a decompression of the biliary tree. Signed, Criselda Peaches, MD [facility] Electronically  Signed   By: Jacqulynn Cadet M.D.   On: 08/29/2015 16:07   Dg Chest Port 1 View  08/20/2015  CLINICAL DATA:  Acute onset of nausea.  Sepsis.  Initial encounter. EXAM: PORTABLE CHEST 1 VIEW COMPARISON:  Chest radiograph performed 01/22/2010, and CT of the chest performed 08/08/2015 FINDINGS: The lungs are well-aerated. Vascular congestion is noted. Mild bibasilar opacities may reflect mild interstitial edema or possibly pneumonia. There is no evidence of pleural effusion or pneumothorax. The cardiomediastinal silhouette is borderline normal in size. The patient is status post median sternotomy. A valve replacement is noted. No acute osseous abnormalities are seen. IMPRESSION: Vascular congestion noted. Mild bibasilar opacities may reflect mild interstitial edema or possibly pneumonia. Electronically Signed   By: Garald Balding M.D.   On: 08/20/2015 05:38   Dg Abd Portable 1v  08/27/2015  CLINICAL DATA:  82 year old female status post nasogastric tube placement. EXAM: PORTABLE ABDOMEN - 1 VIEW COMPARISON:  Abdominal radiograph 08/22/2015. FINDINGS: Nasogastric tube in position with tip in the body of the stomach. Gas, oral contrast material and stool are seen scattered throughout the colon extending to the level of the distal rectum. No pathologic distension of small bowel is noted. No gross evidence of pneumoperitoneum. Surgical clips are noted in the anatomic pelvis. IMPRESSION: 1. Tip of nasogastric tube is in the body of the stomach. Electronically Signed   By: Vinnie Langton M.D.   On: 08/27/2015 17:33   Dg Abd Portable 2v  09/04/2015  CLINICAL DATA:  abdominal discomfort. Vomiting. Pt had external drain placed on 1/10 and duodenal stent placed on 1/13. Hx renal cell carcinoma. EXAM: PORTABLE ABDOMEN - 2 VIEW COMPARISON:  08/31/2015 FINDINGS: Bowel gas pattern is unremarkable no evidence of obstruction. Surgical drain projects adjacent to the duodenal stent in the right mid abdomen. Another surgical  drain projects lateral to the duodenal stent. There are degenerative changes of the lumbar spine with a levoscoliosis bones are demineralized. IMPRESSION: 1. No evidence of bowel obstruction. 2. Duodenal stent surgical drains are noted, similar to the operative imaging obtained on 08/31/2015. Electronically Signed   By: Lajean Manes M.D.   On: 09/04/2015 14:00   Dg C-arm 1-60 Min  08/31/2015  CLINICAL DATA:  Gastric obstruction. Biliary ductal dilatation. Abscess. Right renal neoplasm. EXAM: DG C-ARM 61-120 MIN; ABDOMEN - 1 VIEW COMPARISON:  None. FINDINGS: Two cross-table lateral fluoroscopic spot images show placement of an internal wall stent in the expected region of the duodenum. Two percutaneous pigtail catheters are also seen in this region. IMPRESSION: Internal wall stent seen expected region of duodenum. Percutaneous pigtail catheter is also seen in this region. Electronically Signed   By: Jenny Reichmann  Kris Hartmann M.D.   On: 08/31/2015 13:59   Dg C-arm 1-60 Min-no Report  08/28/2015  CLINICAL DATA: duodenal stricture C-ARM 1-60 MINUTES Fluoroscopy was utilized by the requesting physician.  No radiographic interpretation.   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 Additionally, she has a biliary leak. She requires percutaneous transhepatic cholangiogram with attempted placement of an internal/ external biliary drainage catheter both for biliary decompression and for bile diversion. EXAM: IR INT-EXT BILIARY DRAIN W/ CHOLANGIOGRAM; SINUS TRACT INJECTION/FISTULOGRAM Date: 08/29/2015 PROCEDURE: 1. Contrast injection through existing drainage catheter 2. Ultrasound-guided puncture of the left biliary tree 3. Placement of an internal/external biliary drainage catheter using fluoroscopic guidance Interventional Radiologist:  Criselda Peaches, MD ANESTHESIA/SEDATION: Moderate (conscious) sedation was used. 4 mg  Versed, 150 mcg Fentanyl were administered intravenously. The patient's vital signs were monitored continuously by radiology nursing throughout the procedure. Sedation Time: 30 minutes MEDICATIONS: 3.375 g Zosyn administered intravenously FLUOROSCOPY TIME:  11 minutes 42 seconds for a total of 170 mGy CONTRAST:  49mL OMNIPAQUE IOHEXOL 300 MG/ML  SOLN TECHNIQUE: Informed consent was obtained from the patient following explanation of the procedure, risks, benefits and alternatives. The patient understands, agrees and consents for the procedure. All questions were addressed. A time out was performed. Maximal barrier sterile technique utilized including caps, mask, sterile gowns, sterile gloves, large sterile drape, hand hygiene, and Betadine skin prep. The existing right upper quadrant drainage catheter was gently hand injected with contrast material under real-time fluoroscopic guidance. The contrast material fills a collapsed cavity and then makes a clear fistulous connection with the right posterior biliary tree. There is partial opacification of the dilated right biliary tree. Ultrasound was next used to interrogate the mid epigastric region. The left intrahepatic ducts are significantly dilated. A suitable peripheral segment 3 duct was successfully identified. Local anesthesia was attained by infiltration with 1% lidocaine. A small dermatotomy was made. Under real-time sonographic guidance, the bile duct was punctured with a 21 gauge micropuncture needle. There was return of bile to the needle hub. A gentle hand injection of contrast material confirmed needle placement within the biliary tree. A night tracks wire was successfully advanced into the dilated common hepatic duct. The Accustick sheath was then advanced over the wire and positioned in the dilated common hepatic duct. Transhepatic cholangiography was then performed. There is marked dilatation of the common hepatic duct. There is a high-grade stenosis  bordering on occlusion at the juncture of the common hepatic and common bile ducts. The mid and distal common bile duct are patent in the ampulla is patent. A hydrophilic roadrunner wire was successfully navigated across the obstruction and into the duodenum. The Accustick sheath was then exchanged over the wire. The tract was dilated to 10 [place] and a [place] biliary drainage catheter was advanced over the wire and formed with the locking loop in the duodenum. The proximal side holes extend into the accessed segment 3 duct. Contrast injection through the tube confirms the tube location. The tube was secured to the skin with 0 Prolene suture and an adhesive fixation device. The tube was connected to gravity bag drainage. COMPLICATIONS: None Estimated blood loss:  0 IMPRESSION: 1. Contrast injection through the existing right upper quadrant abscess drain confirms a fistulous connection with the right posterior intrahepatic biliary ducts. Findings are consistent with small biliary leak. 2. Percutaneous transhepatic cholangiography demonstrates severe intra and extrahepatic biliary ductal dilatation.  There is high-grade stenosis bordering on occlusion at the juncture of the common hepatic and common bile ducts. The distal common bile duct and ampulla are patent. 3. Successful placement of an internal/external 10 [place] biliary drainage catheter. PLAN: 1. Maintain biliary drain to bag drainage until bilirubin has significantly trended down. Monitor electrolytes and replete as needed while in-house. The tube should be capped prior to discharge to prevent significant dehydration and electrolyte imbalance. 2. The right upper quadrant biloma drainage catheter should be monitored. When output has ceased, this tube can be removed. If the tube remains in place at the time of discharge patient should follow-up in interventional radiology drain clinic at 1 week following discharge. 3. Patient to undergo upper endoscopy  and placement of a duodenal stent by gastroenterology at their . 4. Return to interventional radiology in 1 month for probable placement of an internal covered metal (Wall Flex) stent for continued palliative a decompression of the biliary tree. Signed, Criselda Peaches, MD [facility] Electronically Signed   By: Jacqulynn Cadet M.D.   On: 08/29/2015 16:07   Ct Image Guided Drainage By Percutaneous Catheter  08/28/2015  CLINICAL DATA:  Right Renal neoplasm. Liver lesion, post biopsy 08/10/2015. Postprocedure development of irregular fluid collection inferior to the caudate lobe of the liver. Abdominal pain. Gastric obstruction. EXAM: CT GUIDED DRAINAGE OF PERITONEAL ABSCESS ANESTHESIA/SEDATION: Clip Intravenous Fentanyl and Versed were administered as conscious sedation during continuous cardiorespiratory monitoring by the radiology RN, with a total moderate sedation time of 16 minutes. PROCEDURE: The procedure, risks, benefits, and alternatives were explained to the patient. Questions regarding the procedure were encouraged and answered. The patient understands and consents to the procedure. Select axial scans through the mid abdomen were obtained. The collection was localized and an appropriate skin entry site determined and marked. The operative field was prepped with Betadinein a sterile fashion, and a sterile drape was applied covering the operative field. A sterile gown and sterile gloves were used for the procedure. Local anesthesia was provided with 1% Lidocaine. Under intermittent CT guidance, an 18 gauge percutaneous entry needle was advanced into the collection. Bilious fluid could be aspirated. A guidewire advanced within the collection, its position confirmed on CT. Tract dilated to facilitate placement of a 12 French pigtail catheter within the collection. A sample of the aspirate was sent for Gram stain, culture and sensitivity.  Catheter secured externally with 0 Prolene suture and StatLock and placed to gravity bag. The patient tolerated the procedure well. COMPLICATIONS: None immediate FINDINGS: Limited CT confirms peritoneal fluid collection inferior to the right lobe of the liver and gallbladder fossa. 12French pigtail drain placed under CT guidance. A sample of the Bilious aspirate was sent for Gram stain, culture and sensitivity. IMPRESSION: 1. Technically successful 12 French pigtail drain catheter into right upper quadrant peritoneal collection under CT guidance. Electronically Signed   By: Lucrezia Europe M.D.   On: 08/28/2015 13:46   CT scan of the abdomen today demonstrates resolution of the perihepatic abscess and decompression of the biliary tree.  Fluoroscopic injection of the abscess drain with contrast fails to reveal any sign of fistula. The abscess cavity is decompressed.   Labs:  CBC:  Recent Labs  09/04/15 0526 09/06/15 0443 09/14/15 1024 09/18/15 0851  WBC 8.5 7.8 8.9 8.6  HGB 11.3* 10.5* 11.3* 10.6*  HCT 34.3* 31.9* 34.1* 32.5*  PLT 317 333 369 343    COAGS:  Recent Labs  08/10/15 0715  08/20/15 0510 08/29/15 1050  INR 0.99 1.24 1.20  APTT  --  38*  --     BMP:  Recent Labs  09/03/15 0454 09/04/15 0526 09/05/15 0447 09/06/15 0443 09/14/15 1024 09/18/15 0851  NA 137 136 134* 135 140 139  K 3.5 3.7 4.3 4.0 4.4 4.6  CL 102 101 98* 101  --   --   CO2 25 26 26 25 29 26   GLUCOSE 78 82 61* 96 121 74  BUN 15 13 14 17  18.2 22.5  CALCIUM 8.2* 8.6* 8.4* 9.0 9.9 10.0  CREATININE 0.85 0.86 0.86 0.99 1.1 1.0  GFRNONAA >60 >60 >60 51*  --   --   GFRAA >60 >60 >60 59*  --   --     LIVER FUNCTION TESTS:  Recent Labs  09/04/15 0526 09/05/15 0447 09/14/15 1024 09/18/15 0851  BILITOT 0.7 0.7 0.54 0.39  AST 48* 43* 79* 36*  ALT 64* 53 101* 42  ALKPHOS 216* 200* 460* 319*  PROT 6.7 6.5 8.0 8.0  ALBUMIN 2.5* 2.3* 2.8* 2.9*    TUMOR MARKERS: No results for input(s): AFPTM,  CEA, CA199, CHROMGRNA in the last 8760 hours.  Assessment and Plan:  Overall there has been significant improvement. The biliary system is decompressed after internal/external biliary drain placement in the abscess cavity has resolved. There is no sign of fistula upon fluoroscopic contrast injection. The abscess drain was removed without difficulty. She will be scheduled for stenting of the biliary obstruction with expected removal of the internal/external biliary drain. She does have a duodenal stent in place which will make biliary stenting problematic but not impossible.  Thank you for this interesting consult.  I greatly enjoyed meeting GUADALUPE MCFARLANE and look forward to participating in their care.  A copy of this report was sent to the requesting provider on this date.  Electronically Signed: Vishnu Moeller, ART A 09/18/2015, 11:20 AM   I spent a total of   15 Minutes in face to face in clinical consultation, greater than 50% of which was counseling/coordinating care for biliary obstruction and abscess drain.

## 2015-09-20 ENCOUNTER — Other Ambulatory Visit: Payer: Self-pay | Admitting: Radiology

## 2015-09-21 ENCOUNTER — Encounter (HOSPITAL_COMMUNITY): Payer: Self-pay

## 2015-09-21 ENCOUNTER — Ambulatory Visit (HOSPITAL_COMMUNITY)
Admission: RE | Admit: 2015-09-21 | Discharge: 2015-09-21 | Disposition: A | Discharge status: Home / Self Care | Payer: Medicare Other | Source: Ambulatory Visit | Attending: Interventional Radiology | Admitting: Interventional Radiology

## 2015-09-21 DIAGNOSIS — K750 Abscess of liver: Secondary | ICD-10-CM | POA: Insufficient documentation

## 2015-09-21 DIAGNOSIS — Z7982 Long term (current) use of aspirin: Secondary | ICD-10-CM | POA: Diagnosis not present

## 2015-09-21 DIAGNOSIS — Z4682 Encounter for fitting and adjustment of non-vascular catheter: Secondary | ICD-10-CM | POA: Diagnosis present

## 2015-09-21 DIAGNOSIS — K831 Obstruction of bile duct: Secondary | ICD-10-CM | POA: Diagnosis not present

## 2015-09-21 DIAGNOSIS — C649 Malignant neoplasm of unspecified kidney, except renal pelvis: Secondary | ICD-10-CM | POA: Diagnosis not present

## 2015-09-21 LAB — COMPREHENSIVE METABOLIC PANEL
ALBUMIN: 3.2 g/dL — AB (ref 3.5–5.0)
ALK PHOS: 256 U/L — AB (ref 38–126)
ALT: 31 U/L (ref 14–54)
ANION GAP: 12 (ref 5–15)
AST: 39 U/L (ref 15–41)
BILIRUBIN TOTAL: 0.3 mg/dL (ref 0.3–1.2)
BUN: 21 mg/dL — ABNORMAL HIGH (ref 6–20)
CALCIUM: 9.2 mg/dL (ref 8.9–10.3)
CO2: 26 mmol/L (ref 22–32)
Chloride: 101 mmol/L (ref 101–111)
Creatinine, Ser: 0.93 mg/dL (ref 0.44–1.00)
GFR, EST NON AFRICAN AMERICAN: 55 mL/min — AB (ref >60)
Glucose, Bld: 96 mg/dL (ref 65–99)
POTASSIUM: 4.2 mmol/L (ref 3.5–5.1)
Sodium: 139 mmol/L (ref 135–145)
TOTAL PROTEIN: 7.7 g/dL (ref 6.5–8.1)

## 2015-09-21 LAB — CBC WITH DIFFERENTIAL/PLATELET
BASOS PCT: 1 %
Basophils Absolute: 0 10*3/uL (ref 0.0–0.1)
Eosinophils Absolute: 0.5 10*3/uL (ref 0.0–0.7)
Eosinophils Relative: 5 %
HEMATOCRIT: 33.3 % — AB (ref 36.0–46.0)
HEMOGLOBIN: 11 g/dL — AB (ref 12.0–15.0)
Lymphocytes Relative: 19 %
Lymphs Abs: 1.6 10*3/uL (ref 0.7–4.0)
MCH: 29.9 pg (ref 26.0–34.0)
MCHC: 33 g/dL (ref 30.0–36.0)
MCV: 90.5 fL (ref 78.0–100.0)
MONO ABS: 0.6 10*3/uL (ref 0.1–1.0)
Monocytes Relative: 7 %
NEUTROS ABS: 5.7 10*3/uL (ref 1.7–7.7)
Neutrophils Relative %: 68 %
Platelets: 344 10*3/uL (ref 150–400)
RBC: 3.68 MIL/uL — ABNORMAL LOW (ref 3.87–5.11)
RDW: 16.5 % — AB (ref 11.5–15.5)
WBC: 8.4 10*3/uL (ref 4.0–10.5)

## 2015-09-21 LAB — PROTIME-INR
INR: 1.12 (ref 0.00–1.49)
PROTHROMBIN TIME: 14.6 s (ref 11.6–15.2)

## 2015-09-21 MED ORDER — PIPERACILLIN-TAZOBACTAM 3.375 G IVPB
3.3750 g | Freq: Once | INTRAVENOUS | Status: AC
  Administered 2015-09-21: 3.375 g via INTRAVENOUS
  Filled 2015-09-21: qty 50

## 2015-09-21 MED ORDER — HYDROCODONE-ACETAMINOPHEN 5-325 MG PO TABS
1.0000 | ORAL_TABLET | ORAL | Status: DC | PRN
  Administered 2015-09-21: 1 via ORAL
  Filled 2015-09-21: qty 1

## 2015-09-21 MED ORDER — MIDAZOLAM HCL 2 MG/2ML IJ SOLN
INTRAMUSCULAR | Status: AC | PRN
  Administered 2015-09-21 (×4): 1 mg via INTRAVENOUS

## 2015-09-21 MED ORDER — SODIUM CHLORIDE 0.9 % IV SOLN
INTRAVENOUS | Status: DC
  Administered 2015-09-21: 10:00:00 via INTRAVENOUS

## 2015-09-21 MED ORDER — FENTANYL CITRATE (PF) 100 MCG/2ML IJ SOLN
INTRAMUSCULAR | Status: AC
  Filled 2015-09-21: qty 4

## 2015-09-21 MED ORDER — FENTANYL CITRATE (PF) 100 MCG/2ML IJ SOLN
INTRAMUSCULAR | Status: AC | PRN
  Administered 2015-09-21: 25 ug via INTRAVENOUS
  Administered 2015-09-21: 50 ug via INTRAVENOUS
  Administered 2015-09-21: 25 ug via INTRAVENOUS

## 2015-09-21 MED ORDER — IOHEXOL 300 MG/ML  SOLN
25.0000 mL | Freq: Once | INTRAMUSCULAR | Status: AC | PRN
  Administered 2015-09-21: 25 mL

## 2015-09-21 MED ORDER — LIDOCAINE HCL 1 % IJ SOLN
INTRAMUSCULAR | Status: AC
  Filled 2015-09-21: qty 20

## 2015-09-21 MED ORDER — MIDAZOLAM HCL 2 MG/2ML IJ SOLN
INTRAMUSCULAR | Status: AC
  Filled 2015-09-21: qty 6

## 2015-09-21 NOTE — Discharge Instructions (Signed)
Moderate Conscious Sedation, Adult °Sedation is the use of medicines to promote relaxation and relieve discomfort and anxiety. Moderate conscious sedation is a type of sedation. Under moderate conscious sedation you are less alert than normal but are still able to respond to instructions or stimulation. Moderate conscious sedation is used during short medical and dental procedures. It is milder than deep sedation or general anesthesia and allows you to return to your regular activities sooner. °LET YOUR HEALTH CARE PROVIDER KNOW ABOUT:  °· Any allergies you have. °· All medicines you are taking, including vitamins, herbs, eye drops, creams, and over-the-counter medicines. °· Use of steroids (by mouth or creams). °· Previous problems you or members of your family have had with the use of anesthetics. °· Any blood disorders you have. °· Previous surgeries you have had. °· Medical conditions you have. °· Possibility of pregnancy, if this applies. °· Use of cigarettes, alcohol, or illegal drugs. °RISKS AND COMPLICATIONS °Generally, this is a safe procedure. However, as with any procedure, problems can occur. Possible problems include: °· Oversedation. °· Trouble breathing on your own. You may need to have a breathing tube until you are awake and breathing on your own. °· Allergic reaction to any of the medicines used for the procedure. °BEFORE THE PROCEDURE °· You may have blood tests done. These tests can help show how well your kidneys and liver are working. They can also show how well your blood clots. °· A physical exam will be done.   °· Only take medicines as directed by your health care provider. You may need to stop taking medicines (such as blood thinners, aspirin, or nonsteroidal anti-inflammatory drugs) before the procedure.   °· Do not eat or drink at least 6 hours before the procedure or as directed by your health care provider. °· Arrange for a responsible adult, family member, or friend to take you home  after the procedure. He or she should stay with you for at least 24 hours after the procedure, until the medicine has worn off. °PROCEDURE  °· An intravenous (IV) catheter will be inserted into one of your veins. Medicine will be able to flow directly into your body through this catheter. You may be given medicine through this tube to help prevent pain and help you relax. °· The medical or dental procedure will be done. °AFTER THE PROCEDURE °· You will stay in a recovery area until the medicine has worn off. Your blood pressure and pulse will be checked.   °·  Depending on the procedure you had, you may be allowed to go home when you can tolerate liquids and your pain is under control. °  °This information is not intended to replace advice given to you by your health care provider. Make sure you discuss any questions you have with your health care provider. °  °Document Released: 04/29/2001 Document Revised: 08/25/2014 Document Reviewed: 04/11/2013 °Elsevier Interactive Patient Education ©2016 [employer] ° °

## 2015-09-21 NOTE — Progress Notes (Signed)
Patient ID: Kim Donovan, female   DOB: 1929-03-29, 82 y.o.   MRN: [id number]    Referring Physician(s): Sherrill,B  Chief Complaint: Metastatic renal cell cancer, biliary obstruction, prior biloma   Subjective: Pt familiar to IR service from right upper quadrant biloma drainage on 08/28/15, and PTC with internal/external biliary drain 08/29/15; patient had removal of biloma drain on 09/18/15. She has a known history of metastatic renal cell carcinoma with biliary obstruction and prior biloma. She has had prior duodenal stent placement on 08/31/15. Biliary drain was capped 08/31/15 and patient has tolerated trial well. She presents again today for attempted placement of a biliary stent. She currently denies fever, headache, chest pain, dyspnea, cough, significant abdominal pain, nausea/vomiting or abnormal bleeding. She continues to have some back discomfort.   Allergies: Tramadol  Medications: Prior to Admission medications   Medication Sig Start Date End Date Taking? Authorizing Provider  ALPRAZolam (XANAX) 0.25 MG tablet Take 1 tablet (0.25 mg total) by mouth at bedtime as needed for anxiety. 09/06/15  Yes Donne Hazel, MD  aspirin 81 MG tablet Take 81 mg by mouth daily.     Yes Historical Provider, MD  Calcium Citrate-Vitamin D 250-200 MG-UNIT TABS Take 2 tablets by mouth daily.    Yes Historical Provider, MD  dorzolamide-timolol (COSOPT) 22.3-6.8 MG/ML ophthalmic solution Place 1 drop into the left eye 2 (two) times daily.    Yes Historical Provider, MD  dronabinol (MARINOL) 2.5 MG capsule Take 1 capsule (2.5 mg total) by mouth 2 (two) times daily before lunch and supper. 09/06/15  Yes Donne Hazel, MD  ezetimibe (ZETIA) 10 MG tablet Take 1 tablet (10 mg total) by mouth daily. 05/12/14  Yes Jolaine Artist, MD  furosemide (LASIX) 20 MG tablet Take 1 tablet (20 mg total) by mouth daily. 05/15/15  Yes Larey Dresser, MD  Multiple Vitamins-Minerals (CENTRUM SILVER ULTRA WOMENS PO) Take  1 tablet by mouth daily.     Yes Historical Provider, MD  Omega-3 Fatty Acids (FISH OIL) 1000 MG CAPS Take 1 capsule by mouth daily. Reported on 08/06/2015   Yes Historical Provider, MD  pantoprazole (PROTONIX) 40 MG tablet Take 1 tablet (40 mg total) by mouth daily. 09/06/15  Yes Donne Hazel, MD  Diphenhydramine-APAP, sleep, (TYLENOL PM EXTRA STRENGTH PO) Take 1 tablet by mouth at bedtime as needed (pain/sleep).     Historical Provider, MD  ondansetron (ZOFRAN) 4 MG tablet Take 1 tablet (4 mg total) by mouth every 6 (six) hours as needed for nausea. 09/06/15   Donne Hazel, MD     Vital Signs: BP 141/86 mmHg  Pulse 94  Temp(Src) 97.5 F (36.4 C) (Oral)  Resp 16  Wt 134 lb (60.782 kg)  SpO2 100%  Physical Exam patient is awake, alert. Chest clear to auscultation bilaterally. Heart with regular rate and rhythm. Abdomen soft, positive bowel sounds, intact biliary drain which is capped with clean, nontender insertion site; slightly right greater than left lower extremity edema  Imaging: Ct Abdomen Pelvis W Contrast  09/18/2015  CLINICAL DATA:  Perihepatic abscess EXAM: CT ABDOMEN AND PELVIS WITH CONTRAST TECHNIQUE: Multidetector CT imaging of the abdomen and pelvis was performed using the standard protocol following bolus administration of intravenous contrast. CONTRAST:  9m ISOVUE-300 IOPAMIDOL (ISOVUE-300) INJECTION 61% COMPARISON:  08/23/2015 FINDINGS: A perihepatic abscess strain has been placed. See image 35. The abscess noted on prior study has completely resolved. A left internal external biliary drain has been placed with its  tip coiled in the duodenum. Biliary tree has somewhat decompressed since the prior study A duodenal stent is in place extending from the bulb to the distal second portion. The stent is patent. Stomach is decompressed. Large heterogeneous and low density mass in the right renal fossa is stable. There may be associated occlusion of the IVC. Multiple liver lesions  primarily in the right lobe are not significantly changed. Right pleural effusion has resolved. Heterogeneous opacities at the left lung base have improved. No new abscess.  No free intraperitoneal gas. Left kidney, spleen, pancreas, and left adrenal gland are stable in appearance. Mild ductal dilatation within the pancreas is stable. IMPRESSION: Status post right perihepatic abscess drain. The abscess has resolved. Left internal external biliary drain has been placed. Biliary dilatation has improved. No new abscess. Duodenal stent placement.  Stomach is decompressed. Right pleural effusion resolved. Pulmonary opacities at the left base improved. Electronically Signed   By: Marybelle Killings M.D.   On: 09/18/2015 11:05   Dg Sinus/fist Tube Chk-non Gi  09/18/2015  INDICATION: Abscess drain EXAM: ABSCESS INJECTION MEDICATIONS: The patient is currently admitted to the hospital and receiving intravenous antibiotics. The antibiotics were administered within an appropriate time frame prior to the initiation of the procedure. ANESTHESIA/SEDATION: None COMPLICATIONS: None immediate. PROCEDURE: Contrast was injected into the existing drain and imaging was obtained. The drain was then cut and removed without complication. FINDINGS: The abscess cavity is decompressed. There is no evidence of fistula. A small amount of contrast extravasated along the contour of the liver. IMPRESSION: The abscess cavity and fistula have both resolved. The drain was then cut and removed without complication. The patient will be scheduled for conversion of the left internal external biliary drain to a metal stent. Electronically Signed   By: Marybelle Killings M.D.   On: 09/18/2015 11:50    Labs:  CBC:  Recent Labs  09/06/15 0443 09/14/15 1024 09/18/15 0851 09/21/15 0950  WBC 7.8 8.9 8.6 8.4  HGB 10.5* 11.3* 10.6* 11.0*  HCT 31.9* 34.1* 32.5* 33.3*  PLT 333 369 343 344    COAGS:  Recent Labs  08/10/15 0715 08/20/15 0510  08/29/15 1050  INR 0.99 1.24 1.20  APTT  --  38*  --     BMP:  Recent Labs  09/03/15 0454 09/04/15 0526 09/05/15 0447 09/06/15 0443 09/14/15 1024 09/18/15 0851  NA 137 136 134* 135 140 139  K 3.5 3.7 4.3 4.0 4.4 4.6  CL 102 101 98* 101  --   --   CO2 _0 GLUCOSE 78 82 61* 96 121 74  BUN _1 18.2 22.5  CALCIUM 8.2* 8.6* 8.4* 9.0 9.9 10.0  CREATININE 0.85 0.86 0.86 0.99 1.1 1.0  GFRNONAA >60 >60 >60 51*  --   --   GFRAA >60 >60 >60 59*  --   --     LIVER FUNCTION TESTS:  Recent Labs  09/04/15 0526 09/05/15 0447 09/14/15 1024 09/18/15 0851  BILITOT 0.7 0.7 0.54 0.39  AST 48* 43* 79* 36*  ALT 64* 53 101* 42  ALKPHOS 216* 200* 460* 319*  PROT 6.7 6.5 8.0 8.0  ALBUMIN 2.5* 2.3* 2.8* 2.9*    Assessment and Plan: Patient with history of metastatic renal cell carcinoma and biliary obstruction/prior biloma. Status post drainage of biloma on 08/28/15 with removal of drain 09/18/15; status post PTC with internal/external biliary drain on 08/29/15, status post duodenal stent on 08/31/15; biliary drain capped  on 08/31/15; currently with normal bilirubin and normal WBC; patient afebrile; she presents again today for follow-up cholangiogram with attempted biliary stent placement. Details/risks of procedure, including not limited to, internal bleeding, infection/sepsis, inability to place stent and need for prolonged external drainage discussed with patient and family with their understanding and consent.   Electronically Signed: D. Rowe Robert 09/21/2015, 10:21 AM   I spent a total of 15 minutes at the the patient's bedside AND on the patient's hospital floor or unit, greater than 50% of which was counseling/coordinating care for cholangiogram with attempted biliary stent placement

## 2015-09-21 NOTE — Procedures (Signed)
Exchange int/ext bili drain for 10x60 internal stent No complication No blood loss. See complete dictation in [facility].

## 2015-09-25 ENCOUNTER — Ambulatory Visit (HOSPITAL_BASED_OUTPATIENT_CLINIC_OR_DEPARTMENT_OTHER): Payer: Medicare Other | Admitting: Oncology

## 2015-09-25 ENCOUNTER — Telehealth: Payer: Self-pay | Admitting: *Deleted

## 2015-09-25 ENCOUNTER — Telehealth: Payer: Self-pay | Admitting: Oncology

## 2015-09-25 VITALS — BP 131/79 | HR 87 | Temp 98.1°F | Resp 18 | Ht 66.0 in | Wt 135.3 lb

## 2015-09-25 DIAGNOSIS — C7910 Secondary malignant neoplasm of unspecified urinary organs: Secondary | ICD-10-CM

## 2015-09-25 DIAGNOSIS — C787 Secondary malignant neoplasm of liver and intrahepatic bile duct: Secondary | ICD-10-CM | POA: Diagnosis not present

## 2015-09-25 DIAGNOSIS — C641 Malignant neoplasm of right kidney, except renal pelvis: Secondary | ICD-10-CM

## 2015-09-25 NOTE — Telephone Encounter (Signed)
Pt confirmed labs/ov/chemo edu class per 02/07 POF, gave pt AVS and Calendar.Cherylann Banas, sent msg to add chemo

## 2015-09-25 NOTE — Progress Notes (Signed)
  [facility] OFFICE PROGRESS NOTE   Diagnosis: Metastatic urothelial carcinoma  INTERVAL HISTORY:   Kim Donovan returns as scheduled. She underwent a repeat CT of the abdomen 09/18/2015. A right pleural effusion has resolved. The perihepatic fluid collection has resolved. The biliary dilatation is improved. The stomach is decompressed. The abdominal drain was removed and the biliary stent was internalized on 09/21/2015.  She reports feeling well. She is participated in physical therapy. She plans to return to her apartment next week. She is tolerating a diet.  Objective:  Vital signs in last 24 hours:  Blood pressure 131/79, pulse 87, temperature 98.1 F (36.7 C), temperature source Oral, resp. rate 18, height 5\' 6"  (1.676 m), weight 135 lb 4.8 oz (61.372 kg), SpO2 100 %.    Resp: Coarse rhonchi at the posterior basis, no respiratory distress Cardio: Regular rate and rhythm GI: No hepatomegaly, nontender, no mass Vascular: Trace low leg/ankle edema on the right greater than left    Medications: I have reviewed the patient's current medications.  Assessment/Plan: 1. Right renal mass  MRI 08/03/2015 with multiple suspicious liver lesions, replacement of the right kidney consistent with an infiltrative neoplasm, probable rectoperineal adenopathy, extension of tumor into the IVC, small right pleural effusion  Staging CTs 08/08/2015 with no lung metastases, infiltrative right renal mass extending into the right renal vein and pararenal space, no lymphadenopathy, liver metastases  Ultrasound-guided biopsy of a right liver lesion 08/10/2015 with the preliminary pathology consistent with metastatic renal cell carcinoma, additional immunohistochemical histochemical stains most consistent with a poorly differentiated urothelial carcinoma  Initiation of pazopanib 08/17/2015, taken for 3 days 2. Right abdomen/flank discomfort secondary to #1  3. Family history of renal  cell cancer  4. Anorexia/weight loss secondary to #1  5. Status post aortic valve replacement  6.Polymyalgia rheumatica- [facility] on hold  7. Admission 08/20/2015 with nausea/vomiting and fever - CT abdomen/pelvis revealed new fluid attenuation surrounding the gastric antrum and hepatic flexure, stable fluid collection on a repeat CT 08/23/2015  placement of a drainage catheter 08/28/2015 with bilious fluid obtained, removed 09/18/2015  8. Probable aspiration pneumonia January 2017  9. Duodenal obstruction noted on endoscopy 08/28/2015  10. Placement of an internal/external biliary drain 08/29/2015, converted to an internal stent 09/21/2015    Disposition:  The right upper quadrant drain has been removed. The biliary stent was internalized. Kim Donovan has an improved performance status. I discussed systemic treatment options with the specimen her family. We discussed gemcitabine/carboplatin and Atezolzimab. I do not feel she is a candidate for cisplatin chemotherapy.  She understands the expected response rate is higher with chemotherapy than immunotherapy. We discussed potential toxicities associated with gemcitabine/carboplatin and the PD1 inhibitor. She feels most comfortable proceeding with immunotherapy. I reviewed the potential toxicities associated with Atezolizumab including the chance for a rash, diarrhea, pneumonitis, hepatitis, and allergic reaction, and endocrinopathy. She agrees to proceed. She will attend a chemotherapy teaching class.  A first treatment with Huey Bienenstock will be scheduled for 10/03/2015. She will return for an office visit and treatment on 10/24/2015.  Betsy Coder, MD  09/25/2015  1:42 PM

## 2015-09-25 NOTE — Telephone Encounter (Signed)
Per staff message and POF I have scheduled appts. Advised scheduler of appts and to move labs. JMW  

## 2015-09-26 ENCOUNTER — Encounter: Payer: Self-pay | Admitting: Skilled Nursing Facility1

## 2015-09-26 ENCOUNTER — Other Ambulatory Visit: Payer: Medicare Other

## 2015-09-26 ENCOUNTER — Encounter: Payer: Self-pay | Admitting: *Deleted

## 2015-09-26 NOTE — Progress Notes (Signed)
Subjective:     Patient ID: Kim Donovan, female   DOB: 09-26-1928, 82 y.o.   MRN: [id number]  HPI   Review of Systems     Objective:   Physical Exam To assist the pt in identifying dietary strategies to gain lost wt.      Assessment:     Pt identified as being malnourished due to lost wt. Pt was contacted via the telephone at [phone]. Pt was unavailable.     Plan:     Dietitian left a voicemail prompting the pt to contact [place], RD,LDN at [phone] in regards to nutrition information.

## 2015-09-29 ENCOUNTER — Other Ambulatory Visit: Payer: Self-pay | Admitting: Oncology

## 2015-10-03 ENCOUNTER — Encounter: Payer: Self-pay | Admitting: Oncology

## 2015-10-03 ENCOUNTER — Ambulatory Visit (HOSPITAL_BASED_OUTPATIENT_CLINIC_OR_DEPARTMENT_OTHER): Payer: Medicare Other

## 2015-10-03 ENCOUNTER — Other Ambulatory Visit: Payer: Self-pay | Admitting: *Deleted

## 2015-10-03 ENCOUNTER — Other Ambulatory Visit: Payer: Medicare Other

## 2015-10-03 VITALS — BP 141/71 | HR 86 | Temp 97.8°F | Resp 16

## 2015-10-03 DIAGNOSIS — C787 Secondary malignant neoplasm of liver and intrahepatic bile duct: Secondary | ICD-10-CM | POA: Diagnosis not present

## 2015-10-03 DIAGNOSIS — Z5112 Encounter for antineoplastic immunotherapy: Secondary | ICD-10-CM

## 2015-10-03 DIAGNOSIS — C7910 Secondary malignant neoplasm of unspecified urinary organs: Secondary | ICD-10-CM

## 2015-10-03 DIAGNOSIS — C641 Malignant neoplasm of right kidney, except renal pelvis: Secondary | ICD-10-CM | POA: Diagnosis not present

## 2015-10-03 MED ORDER — SODIUM CHLORIDE 0.9 % IV SOLN
1200.0000 mg | Freq: Once | INTRAVENOUS | Status: AC
  Administered 2015-10-03: 1200 mg via INTRAVENOUS
  Filled 2015-10-03: qty 20

## 2015-10-03 MED ORDER — SODIUM CHLORIDE 0.9 % IV SOLN
Freq: Once | INTRAVENOUS | Status: AC
  Administered 2015-10-03: 14:00:00 via INTRAVENOUS

## 2015-10-03 MED ORDER — ONDANSETRON HCL 4 MG PO TABS: 4.0000 mg | ORAL_TABLET | Freq: Four times a day (QID) | ORAL | Status: DC | PRN

## 2015-10-03 NOTE — Progress Notes (Signed)
left for dr. Benay Spice to sign.patient already signed

## 2015-10-03 NOTE — Patient Instructions (Signed)
[facility] Discharge Instructions for Patients Receiving Chemotherapy  Today you received the following chemotherapy agents:  Atezolizumab.  To help prevent nausea and vomiting after your treatment, we encourage you to take your nausea medication: Zofran 4 mg every 6 hours as needed.   If you develop nausea and vomiting that is not controlled by your nausea medication, call the clinic.   BELOW ARE SYMPTOMS THAT SHOULD BE REPORTED IMMEDIATELY:  *FEVER GREATER THAN 100.5 F  *CHILLS WITH OR WITHOUT FEVER  NAUSEA AND VOMITING THAT IS NOT CONTROLLED WITH YOUR NAUSEA MEDICATION  *UNUSUAL SHORTNESS OF BREATH  *UNUSUAL BRUISING OR BLEEDING  TENDERNESS IN MOUTH AND THROAT WITH OR WITHOUT PRESENCE OF ULCERS  *URINARY PROBLEMS  *BOWEL PROBLEMS  UNUSUAL RASH Items with * indicate a potential emergency and should be followed up as soon as possible.  Feel free to call the clinic you have any questions or concerns. The clinic phone number is [phone].  Please show the [facility] at check-in to the Emergency Department and triage nurse.

## 2015-10-04 ENCOUNTER — Encounter: Payer: Self-pay | Admitting: Oncology

## 2015-10-04 ENCOUNTER — Telehealth: Payer: Self-pay | Admitting: *Deleted

## 2015-10-04 NOTE — Telephone Encounter (Signed)
"  I was discharged from Rehab yesterday.  Need to check my medication list to know if I need zofran, protonix and potassium." Zofran was approved yesterday.  Protonix and Potassium were discontinued by Hospitalist when admitted in January.    Will contact her pharmacy for these refills.

## 2015-10-04 NOTE — Progress Notes (Signed)
I faxed genetech app for tecentriq-drug replacement

## 2015-10-10 ENCOUNTER — Telehealth: Payer: Self-pay | Admitting: *Deleted

## 2015-10-10 ENCOUNTER — Emergency Department (HOSPITAL_COMMUNITY)
Admission: EM | Admit: 2015-10-10 | Discharge: 2015-10-10 | Disposition: A | Discharge status: Home / Self Care | Payer: Medicare Other | Attending: Emergency Medicine | Admitting: Emergency Medicine

## 2015-10-10 ENCOUNTER — Encounter (HOSPITAL_COMMUNITY): Payer: Self-pay

## 2015-10-10 ENCOUNTER — Emergency Department (HOSPITAL_COMMUNITY): Payer: Medicare Other

## 2015-10-10 DIAGNOSIS — R110 Nausea: Secondary | ICD-10-CM | POA: Diagnosis not present

## 2015-10-10 DIAGNOSIS — Z9221 Personal history of antineoplastic chemotherapy: Secondary | ICD-10-CM | POA: Insufficient documentation

## 2015-10-10 DIAGNOSIS — M8580 Other specified disorders of bone density and structure, unspecified site: Secondary | ICD-10-CM | POA: Insufficient documentation

## 2015-10-10 DIAGNOSIS — Z8669 Personal history of other diseases of the nervous system and sense organs: Secondary | ICD-10-CM | POA: Insufficient documentation

## 2015-10-10 DIAGNOSIS — Z87891 Personal history of nicotine dependence: Secondary | ICD-10-CM | POA: Insufficient documentation

## 2015-10-10 DIAGNOSIS — Z7982 Long term (current) use of aspirin: Secondary | ICD-10-CM | POA: Diagnosis not present

## 2015-10-10 DIAGNOSIS — E785 Hyperlipidemia, unspecified: Secondary | ICD-10-CM | POA: Diagnosis not present

## 2015-10-10 DIAGNOSIS — M1990 Unspecified osteoarthritis, unspecified site: Secondary | ICD-10-CM | POA: Insufficient documentation

## 2015-10-10 DIAGNOSIS — Z79899 Other long term (current) drug therapy: Secondary | ICD-10-CM | POA: Insufficient documentation

## 2015-10-10 DIAGNOSIS — R509 Fever, unspecified: Secondary | ICD-10-CM | POA: Diagnosis present

## 2015-10-10 DIAGNOSIS — I10 Essential (primary) hypertension: Secondary | ICD-10-CM | POA: Diagnosis not present

## 2015-10-10 DIAGNOSIS — Z85528 Personal history of other malignant neoplasm of kidney: Secondary | ICD-10-CM | POA: Diagnosis not present

## 2015-10-10 DIAGNOSIS — M810 Age-related osteoporosis without current pathological fracture: Secondary | ICD-10-CM | POA: Insufficient documentation

## 2015-10-10 LAB — URINALYSIS, ROUTINE W REFLEX MICROSCOPIC
Bilirubin Urine: NEGATIVE
GLUCOSE, UA: NEGATIVE mg/dL
KETONES UR: NEGATIVE mg/dL
Leukocytes, UA: NEGATIVE
Nitrite: NEGATIVE
PH: 6 (ref 5.0–8.0)
Protein, ur: 30 mg/dL — AB
SPECIFIC GRAVITY, URINE: 1.023 (ref 1.005–1.030)

## 2015-10-10 LAB — COMPREHENSIVE METABOLIC PANEL
ALBUMIN: 3.6 g/dL (ref 3.5–5.0)
ALK PHOS: 189 U/L — AB (ref 38–126)
ALT: 31 U/L (ref 14–54)
AST: 45 U/L — ABNORMAL HIGH (ref 15–41)
Anion gap: 10 (ref 5–15)
BUN: 24 mg/dL — ABNORMAL HIGH (ref 6–20)
CALCIUM: 9.1 mg/dL (ref 8.9–10.3)
CHLORIDE: 91 mmol/L — AB (ref 101–111)
CO2: 25 mmol/L (ref 22–32)
CREATININE: 1.02 mg/dL — AB (ref 0.44–1.00)
GFR calc Af Amer: 57 mL/min — ABNORMAL LOW (ref >60)
GFR calc non Af Amer: 49 mL/min — ABNORMAL LOW (ref >60)
GLUCOSE: 90 mg/dL (ref 65–99)
Potassium: 4.3 mmol/L (ref 3.5–5.1)
SODIUM: 126 mmol/L — AB (ref 135–145)
Total Bilirubin: 0.5 mg/dL (ref 0.3–1.2)
Total Protein: 7.9 g/dL (ref 6.5–8.1)

## 2015-10-10 LAB — URINE MICROSCOPIC-ADD ON

## 2015-10-10 LAB — CBC WITH DIFFERENTIAL/PLATELET
Basophils Absolute: 0 10*3/uL (ref 0.0–0.1)
Basophils Relative: 1 %
EOS ABS: 0.1 10*3/uL (ref 0.0–0.7)
Eosinophils Relative: 2 %
HEMATOCRIT: 33.3 % — AB (ref 36.0–46.0)
HEMOGLOBIN: 11.1 g/dL — AB (ref 12.0–15.0)
LYMPHS ABS: 1.3 10*3/uL (ref 0.7–4.0)
LYMPHS PCT: 20 %
MCH: 29.9 pg (ref 26.0–34.0)
MCHC: 33.3 g/dL (ref 30.0–36.0)
MCV: 89.8 fL (ref 78.0–100.0)
MONOS PCT: 10 %
Monocytes Absolute: 0.6 10*3/uL (ref 0.1–1.0)
NEUTROS PCT: 69 %
Neutro Abs: 4.4 10*3/uL (ref 1.7–7.7)
Platelets: 250 10*3/uL (ref 150–400)
RBC: 3.71 MIL/uL — AB (ref 3.87–5.11)
RDW: 16.1 % — ABNORMAL HIGH (ref 11.5–15.5)
WBC: 6.3 10*3/uL (ref 4.0–10.5)

## 2015-10-10 LAB — I-STAT CG4 LACTIC ACID, ED
Lactic Acid, Venous: 0.89 mmol/L (ref 0.5–2.0)
Lactic Acid, Venous: 0.91 mmol/L (ref 0.5–2.0)

## 2015-10-10 NOTE — Telephone Encounter (Signed)
"  I've been checking my temperature throughout the day today.  It fluctuates but the last check = 101.7.  Last ibuprofen I took was a few nights ago and have not taken any today.  Not short of breath or coughing.  I feel tired, slightly nauseated.  Yesterday after I brushed my teeth, I threw up a small amount yellow after gagging.  I've drank 50 oz water today.  Feel full and can't reach 64 oz.  I feel bloated.  Abdomen slightly swollen more than normal.  LBM last night was minimally constipating.  Should I take M.O.M."   Return number [phone].  10-03-2015 Tecentriq.

## 2015-10-10 NOTE — ED Notes (Signed)
Pt presents with c/o fever that started this morning. Pt reports the last time she took her temperature was around 3:30pm and it was around 100.7. Pt has not had any medication for her fever. Pt is a cancer pt, first chemo was on 2/15 which is also the last time she has received treatment. Pt also reporting a lack of appetite.

## 2015-10-10 NOTE — ED Notes (Signed)
MD at bedside. 

## 2015-10-10 NOTE — Telephone Encounter (Signed)
Reviewed pt's call with Ned Card, NP: Pt to go to ED for evaluation given elevated temp, biliary stent and history of perihepatic abscess. Returned call to pt with instructions to go to ED now. She voiced understanding, her husband will drive her. Instructed her to present her "chemo card" at the ED.  Pt reports her temp has been fluctuating, 99.6, 100.7 and 101.7 today. She has had intermittent chills but says this is normal for her.

## 2015-10-10 NOTE — Discharge Instructions (Signed)
Tylenol 650 mg every 6 hours as needed for fever.  We will call you if your blood cultures suggest you require further treatment.  Follow-up with your oncologist in the next 1-2 days, and return to the ER if symptoms significantly worsen or change.   Fever, Adult A fever is an increase in the body's temperature. It is usually defined as a temperature of 100F (38C) or higher. Brief mild or moderate fevers generally have no long-term effects, and they often do not require treatment. Moderate or high fevers may make you feel uncomfortable and can sometimes be a sign of a serious illness or disease. The sweating that may occur with repeated or prolonged fever may also cause dehydration. Fever is confirmed by taking a temperature with a thermometer. A measured temperature can vary with:  Age.  Time of day.  Location of the thermometer:  Mouth (oral).  Rectum (rectal).  Ear (tympanic).  Underarm (axillary).  Forehead (temporal). HOME CARE INSTRUCTIONS Pay attention to any changes in your symptoms. Take these actions to help with your condition:  Take over-the counter and prescription medicines only as told by your health care provider. Follow the dosing instructions carefully.  If you were prescribed an antibiotic medicine, take it as told by your health care provider. Do not stop taking the antibiotic even if you start to feel better.  Rest as needed.  Drink enough fluid to keep your urine clear or pale yellow. This helps to prevent dehydration.  Sponge yourself or bathe with room-temperature water to help reduce your body temperature as needed. Do not use ice water.  Do not overbundle yourself in blankets or heavy clothes. SEEK MEDICAL CARE IF:  You vomit.  You cannot eat or drink without vomiting.  You have diarrhea.  You have pain when you urinate.  Your symptoms do not improve with treatment.  You develop new symptoms.  You develop excessive weakness. SEEK  IMMEDIATE MEDICAL CARE IF:  You have shortness of breath or have trouble breathing.  You are dizzy or you faint.  You are disoriented or confused.  You develop signs of dehydration, such as a dry mouth, decreased urination, or paleness.  You develop severe pain in your abdomen.  You have persistent vomiting or diarrhea.  You develop a skin rash.  Your symptoms suddenly get worse.   This information is not intended to replace advice given to you by your health care provider. Make sure you discuss any questions you have with your health care provider.   Document Released: 01/28/2001 Document Revised: 04/25/2015 Document Reviewed: 09/28/2014 Elsevier Interactive Patient Education [employer].

## 2015-10-10 NOTE — ED Provider Notes (Signed)
CSN: [id number]     Arrival date & time 10/10/15  1633 History   First MD Initiated Contact with Patient 10/10/15 1939     Chief Complaint  Patient presents with  . Fever     (Consider location/radiation/quality/duration/timing/severity/associated sxs/prior Treatment) HPI Comments: Patient is an 82 year old female with extensive past medical history including metastatic urothelial carcinoma, hypertension, PMR, CHF. She started chemotherapy 1 week ago and was instructed to monitor her temperature. This morning she checked her temperature and it was 101. She called her oncologist, Dr. Benay Spice who recommended she come to the ER to be evaluated. The patient denies any specific complaints such as cough, dysuria, abdominal pain, sore throat. She denies any chills or a nurse.  Patient is a 82 y.o. female presenting with fever. The history is provided by the patient.  Fever Max temp prior to arrival:  101 Temp source:  Oral Severity:  Moderate Onset quality:  Sudden Duration:  8 hours Timing:  Constant Progression:  Unchanged Chronicity:  New Relieved by:  Nothing Worsened by:  Nothing tried Ineffective treatments:  None tried Associated symptoms: nausea   Associated symptoms: no chest pain, no dysuria, no sore throat and no vomiting     Past Medical History  Diagnosis Date  . Hyperlipidemia   . Osteoarthritis   . Osteoporosis/osteopenia increased risk   . Glaucoma   . Aortic stenosis     myoview neg ischemia 11/2006  ---- echo 11/10: mean AVA gradient~47   ---- s/p bioprosthetic AVR 12/2009  . Polymyalgia rheumatica ([facility]) 11/05/2010  . PVD (peripheral vascular disease) ([facility])     left leg swells  . Hypertension     pt. states does not have HTN, no meds  . Renal cell carcinoma ([facility]) 07/2016    with liver mets, liver biopsy confirmed urothelial origin.    Past Surgical History  Procedure Laterality Date  . Appendectomy    . Tonsillectomy    . Cholecystectomy  1958  . Cardiac  valve replacement  May 2011    aortic valve  Porcine  . Carpal tunnel release  12/2010    left hand  . Cataract/glaucoma surgery  11/2010    [facility]  . Breast surgery  1985    bx  . Abdominal hysterectomy      for fibroids  . Mastectomy      was only for removal of cysts, benign  . Eye surgery  01/2011    for glaucoma  . Carpal tunnel release  08/29/2011    Procedure: CARPAL TUNNEL RELEASE;  Surgeon: Cammie Sickle., MD;  Location: [facility];  Service: Orthopedics;  Laterality: Right;  . Esophagogastroduodenoscopy (egd) with propofol N/A 08/28/2015    Procedure: ESOPHAGOGASTRODUODENOSCOPY (EGD) WITH PROPOFOL;  Surgeon: Ladene Artist, MD;  Location: [facility];  Service: Endoscopy;  Laterality: N/A;  . Esophagogastroduodenoscopy N/A 08/31/2015    Procedure: ESOPHAGOGASTRODUODENOSCOPY (EGD), with stent;  Surgeon: Jerene Bears, MD;  Location: [facility];  Service: Gastroenterology;  Laterality: N/A;   Family History  Problem Relation Age of Onset  . Deep vein thrombosis Mother   . Heart attack Father   . Stroke Father   . Osteoporosis      aunt   Social History  Substance Use Topics  . Smoking status: Former Smoker -- 0.50 packs/day    Types: Cigarettes    Quit date: 08/18/1978  . Smokeless tobacco: None  . Alcohol Use: No   OB History  No data available     Review of Systems  Constitutional: Positive for fever.  HENT: Negative for sore throat.   Cardiovascular: Negative for chest pain.  Gastrointestinal: Positive for nausea. Negative for vomiting.  Genitourinary: Negative for dysuria.  All other systems reviewed and are negative.     Allergies  Tramadol  Home Medications   Prior to Admission medications   Medication Sig Start Date End Date Taking? Authorizing Provider  ALPRAZolam (XANAX) 0.25 MG tablet Take 1 tablet (0.25 mg total) by mouth at bedtime as needed for anxiety. 09/06/15  Yes Donne Hazel, MD  aspirin 81 MG tablet  Take 81 mg by mouth daily.     Yes Historical Provider, MD  Calcium Citrate-Vitamin D 250-200 MG-UNIT TABS Take 2 tablets by mouth daily.    Yes Historical Provider, MD  Diphenhydramine-APAP, sleep, (TYLENOL PM EXTRA STRENGTH PO) Take 1 tablet by mouth at bedtime as needed (pain/sleep).    Yes Historical Provider, MD  dorzolamide-timolol (COSOPT) 22.3-6.8 MG/ML ophthalmic solution Place 1 drop into the left eye 2 (two) times daily.    Yes Historical Provider, MD  ezetimibe (ZETIA) 10 MG tablet Take 1 tablet (10 mg total) by mouth daily. 05/12/14  Yes Jolaine Artist, MD  furosemide (LASIX) 20 MG tablet Take 1 tablet (20 mg total) by mouth daily. 05/15/15  Yes Larey Dresser, MD  Multiple Vitamins-Minerals (CENTRUM SILVER ULTRA WOMENS PO) Take 1 tablet by mouth daily.     Yes Historical Provider, MD  Omega-3 Fatty Acids (FISH OIL) 1000 MG CAPS Take 1 capsule by mouth daily. Reported on 08/06/2015   Yes Historical Provider, MD  ondansetron (ZOFRAN) 4 MG tablet Take 1 tablet (4 mg total) by mouth every 6 (six) hours as needed for nausea. 10/03/15  Yes Ladell Pier, MD  pantoprazole (PROTONIX) 40 MG tablet Take 1 tablet (40 mg total) by mouth daily. 09/06/15  Yes Donne Hazel, MD  dronabinol (MARINOL) 2.5 MG capsule Take 1 capsule (2.5 mg total) by mouth 2 (two) times daily before lunch and supper. Patient not taking: Reported on 10/10/2015 09/06/15   Donne Hazel, MD   BP 132/77 mmHg  Pulse 97  Temp(Src) 99.3 F (37.4 C) (Oral)  Resp 16  SpO2 98% Physical Exam  Constitutional: She is oriented to person, place, and time. She appears well-developed and well-nourished. No distress.  HENT:  Head: Normocephalic and atraumatic.  Mouth/Throat: Oropharynx is clear and moist.  Neck: Normal range of motion. Neck supple.  Cardiovascular: Normal rate and regular rhythm.  Exam reveals no gallop and no friction rub.   No murmur heard. Pulmonary/Chest: Effort normal and breath sounds normal. No  respiratory distress. She has no wheezes.  Abdominal: Soft. Bowel sounds are normal. She exhibits no distension. There is no tenderness.  Musculoskeletal: Normal range of motion. She exhibits no edema.  Neurological: She is alert and oriented to person, place, and time.  Skin: Skin is warm and dry. She is not diaphoretic.  Nursing note and vitals reviewed.   ED Course  Procedures (including critical care time) Labs Review Labs Reviewed  COMPREHENSIVE METABOLIC PANEL - Abnormal; Notable for the following:    Sodium 126 (*)    Chloride 91 (*)    BUN 24 (*)    Creatinine, Ser 1.02 (*)    AST 45 (*)    Alkaline Phosphatase 189 (*)    GFR calc non Af Amer 49 (*)    GFR calc Af Wyvonnia Lora  57 (*)    All other components within normal limits  CBC WITH DIFFERENTIAL/PLATELET - Abnormal; Notable for the following:    RBC 3.71 (*)    Hemoglobin 11.1 (*)    HCT 33.3 (*)    RDW 16.1 (*)    All other components within normal limits  CULTURE, BLOOD (ROUTINE X 2)  CULTURE, BLOOD (ROUTINE X 2)  URINE CULTURE  URINALYSIS, ROUTINE W REFLEX MICROSCOPIC (NOT AT [facility])  I-STAT CG4 LACTIC ACID, ED  I-STAT CG4 LACTIC ACID, ED    Imaging Review Dg Chest 2 View  10/10/2015  CLINICAL DATA:  Fever starting this morning. EXAM: CHEST  2 VIEW COMPARISON:  08/20/2015 FINDINGS: Hyperexpansion is consistent with emphysema. The cardiopericardial silhouette is within normal limits for size. Patient is status post cardiac valve replacement. Thoracolumbar scoliosis again noted. IMPRESSION: No active cardiopulmonary disease. Electronically Signed   By: Misty Stanley M.D.   On: 10/10/2015 17:51   I have personally reviewed and evaluated these images and lab results as part of my medical decision-making.   MDM   Final diagnoses:  None    Patient is an 82 year old female with history of cancer on chemotherapy. He presents for evaluation of fever at home. She is afebrile here and is nontoxic appearing. She is not  neutropenic and is otherwise well-appearing. Workup today reveals no obvious source of fever. Chest x-ray is clear, urinalysis is clear, and blood cultures are currently pending. She will be discharged with instructions to follow-up with her oncologist in the next 1-2 days, and return to the ER if symptoms significantly worsen or change.    Veryl Speak, MD 10/10/15 2203

## 2015-10-11 ENCOUNTER — Other Ambulatory Visit: Payer: Self-pay | Admitting: *Deleted

## 2015-10-11 ENCOUNTER — Telehealth: Payer: Self-pay | Admitting: Nurse Practitioner

## 2015-10-11 ENCOUNTER — Telehealth: Payer: Self-pay

## 2015-10-11 ENCOUNTER — Ambulatory Visit (HOSPITAL_BASED_OUTPATIENT_CLINIC_OR_DEPARTMENT_OTHER): Payer: Medicare Other | Admitting: Nurse Practitioner

## 2015-10-11 VITALS — BP 140/80 | HR 65 | Temp 97.8°F | Resp 18 | Ht 66.0 in

## 2015-10-11 DIAGNOSIS — E871 Hypo-osmolality and hyponatremia: Secondary | ICD-10-CM | POA: Diagnosis not present

## 2015-10-11 DIAGNOSIS — C641 Malignant neoplasm of right kidney, except renal pelvis: Secondary | ICD-10-CM | POA: Diagnosis not present

## 2015-10-11 DIAGNOSIS — C7910 Secondary malignant neoplasm of unspecified urinary organs: Secondary | ICD-10-CM

## 2015-10-11 DIAGNOSIS — C787 Secondary malignant neoplasm of liver and intrahepatic bile duct: Secondary | ICD-10-CM

## 2015-10-11 MED ORDER — ALPRAZOLAM 0.25 MG PO TABS: 0.2500 mg | ORAL_TABLET | Freq: Every evening | ORAL | Status: DC | PRN

## 2015-10-11 NOTE — Telephone Encounter (Signed)
Patient returning Kim Donovan, Kim Donovan call regarding her visit in the ED yesterday.  Patient states that she went to the ED because she was febrile.  Patient was there for over 6 hours, returned home went to bed.  She states that the test results that did come back were all normal while she was there and she had blood cultures that won't be back for a couple of days. Patient states that she got up to use the BR in the middle of the night, tripped over her slipper, fell and hit her nose on the bathtub.  She states that she bled quite a bit and bruised her thumb that appears as if it might be broken. Patient continued to have a fever at 9 am this morning - 100.5, at which time she took two tylenol.  The last time she took her temp. was at 11:30 am and it was 97.5. Ned Card, NP aware.

## 2015-10-11 NOTE — Telephone Encounter (Signed)
-----   Message from Owens Shark, NP sent at 10/11/2015  9:21 AM EST ----- Please call and check on her. She was seen in the ER yesterday for fever. Thanks

## 2015-10-11 NOTE — Telephone Encounter (Signed)
As per Owens Shark NP call returned to Pt. asked if she could come in today. Pt. Stated she could come in today.

## 2015-10-11 NOTE — Telephone Encounter (Signed)
per pof to sch pt appt-gave pt copy of avs °

## 2015-10-11 NOTE — Telephone Encounter (Signed)
Xanax script called in to [facility] at [facility] per order of L. Marcello Moores NP.

## 2015-10-11 NOTE — Progress Notes (Addendum)
Kim Donovan OFFICE PROGRESS NOTE   Diagnosis:  Metastatic urothelial carcinoma  INTERVAL HISTORY:   Kim Donovan returns prior to scheduled follow-up. She completed cycle one Tecentriq on 10/03/2015. She contacted the office yesterday to report fever 101.7 and abdominal bloating. She was referred to the emergency department due to her history of a perihepatic abscess and also having a biliary stent in place. She was afebrile in the emergency department. Blood and urine cultures were obtained. Chest x-ray was negative. She was discharged home.  She got up to go to the bathroom during the night. After urinating she tripped over her slipper and fell. She struck the left thumb and her nose which caused a nosebleed. She notes swelling of the left thumb and also her nose.  She reports good fluid intake. She estimates at least 48 ounces today. No lightheadedness or dizziness. Appetite is poor.   Temperature this morning at 7:30 AM was 100.5. She took some Tylenol. At 11:30 she repeated her temperature and it was 97.6. She denies cough and shortness of breath. No abdominal pain. No diarrhea. No dysuria.  Objective:  Vital signs in last 24 hours:  Blood pressure 140/80, pulse 65, temperature 97.8 F (36.6 C), temperature source Oral, resp. rate 18, height 5\' 6"  (1.676 m), SpO2 100 %.    HEENT: Ecchymosis on the forehead, nose and around the eyes medially. Resp: Lungs clear bilaterally. Cardio: Regular rate and rhythm. GI: Abdomen soft and nontender. No hepatomegaly. No mass. Vascular: Trace lower leg edema bilaterally right greater than left. Neuro: Alert and oriented. Follows commands.  Skin: Ecchymosis/edema left thumb.    Lab Results:  Lab Results  Component Value Date   WBC 6.3 10/10/2015   HGB 11.1* 10/10/2015   HCT 33.3* 10/10/2015   MCV 89.8 10/10/2015   PLT 250 10/10/2015   NEUTROABS 4.4 10/10/2015    Imaging:  Dg Chest 2 View  10/10/2015  CLINICAL DATA:   Fever starting this morning. EXAM: CHEST  2 VIEW COMPARISON:  08/20/2015 FINDINGS: Hyperexpansion is consistent with emphysema. The cardiopericardial silhouette is within normal limits for size. Patient is status post cardiac valve replacement. Thoracolumbar scoliosis again noted. IMPRESSION: No active cardiopulmonary disease. Electronically Signed   By: Misty Stanley M.D.   On: 10/10/2015 17:51    Medications: I have reviewed the patient's current medications.  Assessment/Plan: 1. Right renal mass  MRI 08/03/2015 with multiple suspicious liver lesions, replacement of the right kidney consistent with an infiltrative neoplasm, probable rectoperineal adenopathy, extension of tumor into the IVC, small right pleural effusion  Staging CTs 08/08/2015 with no lung metastases, infiltrative right renal mass extending into the right renal vein and pararenal space, no lymphadenopathy, liver metastases  Ultrasound-guided biopsy of a right liver lesion 08/10/2015 with the preliminary pathology consistent with metastatic renal cell carcinoma, additional immunohistochemical histochemical stains most consistent with a poorly differentiated urothelial carcinoma  Initiation of pazopanib 08/17/2015, taken for 3 days  Cycle 1 Tecentriq 10/03/2015 2. Right abdomen/flank discomfort secondary to #1  3. Family history of renal cell cancer  4. Anorexia/weight loss secondary to #1  5. Status post aortic valve replacement  6.Polymyalgia rheumatica- Embrel on hold  7. Admission 08/20/2015 with nausea/vomiting and fever - CT abdomen/pelvis revealed new fluid attenuation surrounding the gastric antrum and hepatic flexure, stable fluid collection on a repeat CT 08/23/2015  placement of a drainage catheter 08/28/2015 with bilious fluid obtained, removed 09/18/2015  8. Probable aspiration pneumonia January 2017  9. Duodenal obstruction  noted on endoscopy 08/28/2015  10. Placement of an  internal/external biliary drain 08/29/2015, converted to an internal stent 09/21/2015  11.   Recent fevers. Question tumor fever, question infectious etiology.  12.   Fall 10/11/2015. Left thumb edema and ecchymosis; facial/nose edema and ecchymosis.  13.   Hyponatremia on labs 10/10/2015.   Disposition: [name] completed cycle 1 Tecentriq 10/03/2015.   She was seen in the emergency department yesterday for evaluation of fever. Etiology unclear. We will follow-up on the blood and urine cultures. If the fevers persist we will consider abdominal imaging given her history of a perihepatic abscess and biliary stent. She understands to contact the office with recurrent fever.  Sodium level was low on labs done yesterday in the emergency department. She will return to the office tomorrow for a repeat chemistry panel.  We instructed her to apply ice to the left thumb and nose.  She will return for a follow-up visit next week.   Patient seen with Dr. Benay Spice. 25 minutes were spent face-to-face at today's visit with the majority of that time involved in counseling/coordination of care.    Ned Card ANP/GNP-BC   10/11/2015  5:04 PM   this was a shared visit with Ned Card. Ms. Beitel was interviewed and examined. I am concerned the low-grade fever could be related to recurrent infection at the upper abdomen versus tumor fever. She appears well today. We encouraged her to push fluids and be careful with ambulation. I doubt her presentation to the emergency room yesterday was related to toxicity from the atezolizumab. She will return for a repeat chemistry panel tomorrow.  Julieanne Manson, M.D.

## 2015-10-11 NOTE — Telephone Encounter (Signed)
As per Owens Shark call placed to follow up on Pt.'s visit to ER yesterday. Message left to return call to [facility]

## 2015-10-11 NOTE — Telephone Encounter (Signed)
per Santiago Glad to add pt appt on 1:45-added appt

## 2015-10-12 ENCOUNTER — Other Ambulatory Visit (HOSPITAL_BASED_OUTPATIENT_CLINIC_OR_DEPARTMENT_OTHER): Payer: Medicare Other

## 2015-10-12 ENCOUNTER — Telehealth: Payer: Self-pay | Admitting: Nurse Practitioner

## 2015-10-12 ENCOUNTER — Encounter: Payer: Self-pay | Admitting: Oncology

## 2015-10-12 DIAGNOSIS — C641 Malignant neoplasm of right kidney, except renal pelvis: Secondary | ICD-10-CM

## 2015-10-12 DIAGNOSIS — C7910 Secondary malignant neoplasm of unspecified urinary organs: Secondary | ICD-10-CM

## 2015-10-12 LAB — COMPREHENSIVE METABOLIC PANEL
ALT: 37 U/L (ref 0–55)
ANION GAP: 9 meq/L (ref 3–11)
AST: 58 U/L — ABNORMAL HIGH (ref 5–34)
Albumin: 2.9 g/dL — ABNORMAL LOW (ref 3.5–5.0)
Alkaline Phosphatase: 225 U/L — ABNORMAL HIGH (ref 40–150)
BUN: 26.2 mg/dL — ABNORMAL HIGH (ref 7.0–26.0)
CHLORIDE: 95 meq/L — AB (ref 98–109)
CO2: 26 meq/L (ref 22–29)
Calcium: 9.3 mg/dL (ref 8.4–10.4)
Creatinine: 1.1 mg/dL (ref 0.6–1.1)
EGFR: 48 mL/min/{1.73_m2} — AB (ref >90)
Glucose: 94 mg/dl (ref 70–140)
Potassium: 5.1 mEq/L (ref 3.5–5.1)
Sodium: 129 mEq/L — ABNORMAL LOW (ref 136–145)
Total Bilirubin: 0.43 mg/dL (ref 0.20–1.20)
Total Protein: 7.2 g/dL (ref 6.4–8.3)

## 2015-10-12 LAB — URINE CULTURE

## 2015-10-12 MED FILL — ONDANSETRON HCL 4 MG TABLET: 4 | 5 days supply | Qty: 20 | Fill #0

## 2015-10-12 NOTE — Telephone Encounter (Signed)
Pt. returning my call,  informed her that we have sent a prescription to [facility] for the [employer]. The pharmacy states that she will need prior authorization for the medication but can pick up the prescription for $ 15 dollars Pt. Verbalized understanding and will come to pick up the prescription.

## 2015-10-15 LAB — CULTURE, BLOOD (ROUTINE X 2)
CULTURE: NO GROWTH
Culture: NO GROWTH

## 2015-10-16 ENCOUNTER — Other Ambulatory Visit: Payer: Self-pay | Admitting: Nurse Practitioner

## 2015-10-16 ENCOUNTER — Other Ambulatory Visit: Payer: Self-pay | Admitting: *Deleted

## 2015-10-16 DIAGNOSIS — C7910 Secondary malignant neoplasm of unspecified urinary organs: Secondary | ICD-10-CM

## 2015-10-17 ENCOUNTER — Encounter: Payer: Self-pay | Admitting: Oncology

## 2015-10-17 ENCOUNTER — Ambulatory Visit (HOSPITAL_BASED_OUTPATIENT_CLINIC_OR_DEPARTMENT_OTHER): Payer: Medicare Other | Admitting: Nurse Practitioner

## 2015-10-17 ENCOUNTER — Other Ambulatory Visit (HOSPITAL_BASED_OUTPATIENT_CLINIC_OR_DEPARTMENT_OTHER): Payer: Medicare Other

## 2015-10-17 VITALS — BP 107/58 | HR 92 | Temp 97.8°F | Resp 18 | Ht 66.0 in | Wt 136.0 lb

## 2015-10-17 DIAGNOSIS — C641 Malignant neoplasm of right kidney, except renal pelvis: Secondary | ICD-10-CM

## 2015-10-17 DIAGNOSIS — C787 Secondary malignant neoplasm of liver and intrahepatic bile duct: Secondary | ICD-10-CM

## 2015-10-17 DIAGNOSIS — R350 Frequency of micturition: Secondary | ICD-10-CM | POA: Diagnosis not present

## 2015-10-17 DIAGNOSIS — R630 Anorexia: Secondary | ICD-10-CM

## 2015-10-17 DIAGNOSIS — C7910 Secondary malignant neoplasm of unspecified urinary organs: Secondary | ICD-10-CM

## 2015-10-17 LAB — COMPREHENSIVE METABOLIC PANEL
ALBUMIN: 3 g/dL — AB (ref 3.5–5.0)
ALK PHOS: 328 U/L — AB (ref 40–150)
ALT: 38 U/L (ref 0–55)
AST: 45 U/L — AB (ref 5–34)
Anion Gap: 8 mEq/L (ref 3–11)
BILIRUBIN TOTAL: 0.52 mg/dL (ref 0.20–1.20)
BUN: 25.6 mg/dL (ref 7.0–26.0)
CO2: 29 meq/L (ref 22–29)
Calcium: 9.8 mg/dL (ref 8.4–10.4)
Chloride: 95 mEq/L — ABNORMAL LOW (ref 98–109)
Creatinine: 1.2 mg/dL — ABNORMAL HIGH (ref 0.6–1.1)
EGFR: 41 mL/min/{1.73_m2} — ABNORMAL LOW (ref >90)
GLUCOSE: 104 mg/dL (ref 70–140)
Potassium: 4.9 mEq/L (ref 3.5–5.1)
SODIUM: 132 meq/L — AB (ref 136–145)
TOTAL PROTEIN: 7.5 g/dL (ref 6.4–8.3)

## 2015-10-17 LAB — CBC WITH DIFFERENTIAL/PLATELET
BASO%: 0.8 % (ref 0.0–2.0)
Basophils Absolute: 0.1 10*3/uL (ref 0.0–0.1)
EOS%: 7.2 % — AB (ref 0.0–7.0)
Eosinophils Absolute: 0.5 10*3/uL (ref 0.0–0.5)
HCT: 31.6 % — ABNORMAL LOW (ref 34.8–46.6)
HGB: 10.3 g/dL — ABNORMAL LOW (ref 11.6–15.9)
LYMPH%: 22.2 % (ref 14.0–49.7)
MCH: 29.1 pg (ref 25.1–34.0)
MCHC: 32.6 g/dL (ref 31.5–36.0)
MCV: 89.3 fL (ref 79.5–101.0)
MONO#: 0.6 10*3/uL (ref 0.1–0.9)
MONO%: 9 % (ref 0.0–14.0)
NEUT%: 60.8 % (ref 38.4–76.8)
NEUTROS ABS: 4 10*3/uL (ref 1.5–6.5)
Platelets: 270 10*3/uL (ref 145–400)
RBC: 3.54 10*6/uL — AB (ref 3.70–5.45)
RDW: 16.2 % — ABNORMAL HIGH (ref 11.2–14.5)
WBC: 6.6 10*3/uL (ref 3.9–10.3)
lymph#: 1.5 10*3/uL (ref 0.9–3.3)

## 2015-10-17 MED ORDER — DRONABINOL 2.5 MG PO CAPS: 2.5000 mg | ORAL_CAPSULE | Freq: Two times a day (BID) | ORAL | Status: DC

## 2015-10-17 NOTE — Progress Notes (Addendum)
  [place] OFFICE PROGRESS NOTE   Diagnosis:   Urothelial carcinoma  INTERVAL HISTORY:   Ms. Mcintyre returns as scheduled. No further falls. No fever. The bruises on her face and left thumb are resolving. She has been constipated. She took milk of magnesia and had a small bowel movement last night. She notes that her appetite has declined since Marinol was discontinued. She continues to have frequent urination. She reports the skin "burns" over the buttocks. She has not noticed a rash.  Objective:  Vital signs in last 24 hours:  Blood pressure 107/58, pulse 92, temperature 97.8 F (36.6 C), temperature source Oral, resp. rate 18, height 5\' 6"  (1.676 m), weight 136 lb (61.689 kg), SpO2 100 %.    HEENT: No thrush or ulcers. Mucous membranes are moist. Resp: Distant breath sounds. No respiratory distress. Cardio: Regular rate and rhythm. GI: Abdomen soft and nontender. No hepatomegaly. Vascular: Trace edema lower legs bilaterally right greater than left.  Skin: Resolving ecchymoses face and left thumb. No rash over the buttocks.   Lab Results:  Lab Results  Component Value Date   WBC 6.6 10/17/2015   HGB 10.3* 10/17/2015   HCT 31.6* 10/17/2015   MCV 89.3 10/17/2015   PLT 270 10/17/2015   NEUTROABS 4.0 10/17/2015    Imaging:  No results found.  Medications: I have reviewed the patient's current medications.  Assessment/Plan: 1. Right renal mass  MRI 08/03/2015 with multiple suspicious liver lesions, replacement of the right kidney consistent with an infiltrative neoplasm, probable rectoperineal adenopathy, extension of tumor into the IVC, small right pleural effusion  Staging CTs 08/08/2015 with no lung metastases, infiltrative right renal mass extending into the right renal vein and pararenal space, no lymphadenopathy, liver metastases  Ultrasound-guided biopsy of a right liver lesion 08/10/2015 with the preliminary pathology consistent with metastatic  renal cell carcinoma, additional immunohistochemical histochemical stains most consistent with a poorly differentiated urothelial carcinoma  Initiation of pazopanib 08/17/2015, taken for 3 days  Cycle 1 Tecentriq 10/03/2015 2. Right abdomen/flank discomfort secondary to #1  3. Family history of renal cell cancer  4. Anorexia/weight loss secondary to #1  5. Status post aortic valve replacement  6.Polymyalgia rheumatica- Embrel on hold  7. Admission 08/20/2015 with nausea/vomiting and fever - CT abdomen/pelvis revealed new fluid attenuation surrounding the gastric antrum and hepatic flexure, stable fluid collection on a repeat CT 08/23/2015  placement of a drainage catheter 08/28/2015 with bilious fluid obtained, removed 09/18/2015  8. Probable aspiration pneumonia January 2017  9. Duodenal obstruction noted on endoscopy 08/28/2015  10. Placement of an internal/external biliary drain 08/29/2015, converted to an internal stent 09/21/2015  11. Recent fevers. Question tumor fever, question infectious etiology.  12. Fall 10/11/2015. Left thumb edema and ecchymosis; facial/nose edema and ecchymosis.  13. Hyponatremia on labs 10/10/2015. Improved 10/12/2015. Improved 10/17/2015     Disposition: Ms. Danielsen appears stable. She complains of anorexia and would like to resume Marinol. She was provided with a new prescription today. She will work on nutrition and activity over the next week. We will see her back as scheduled on 10/24/2015. She will contact the office in the interim with any problems.  Patient seen with Dr. Benay Spice.    Ned Card ANP/GNP-BC   10/17/2015  10:51 AM  This was a shared visit with Ned Card. Ms. Bosch appears stable. She will return for cycle 2 pembrolizumab next week.  Julieanne Manson, M.D.

## 2015-10-17 NOTE — Progress Notes (Signed)
Sent prior auth req dronabinol

## 2015-10-19 ENCOUNTER — Encounter: Payer: Self-pay | Admitting: Oncology

## 2015-10-19 NOTE — Progress Notes (Signed)
Faxed prior auth form to [id number]

## 2015-10-21 ENCOUNTER — Other Ambulatory Visit: Payer: Self-pay | Admitting: Oncology

## 2015-10-22 ENCOUNTER — Encounter: Payer: Self-pay | Admitting: Oncology

## 2015-10-22 ENCOUNTER — Encounter: Payer: Self-pay | Admitting: Nutrition

## 2015-10-22 MED FILL — DRONABINOL 2.5 MG CAPSULE: 2.5 | 30 days supply | Qty: 60 | Fill #0

## 2015-10-22 NOTE — Progress Notes (Signed)
Per [employer] dronabinol approved until 04/14/16 I sent to medical records

## 2015-10-22 NOTE — Progress Notes (Signed)
Patient cancelled nutrition appointment and stated she would reschedule at a later date.

## 2015-10-24 ENCOUNTER — Ambulatory Visit (HOSPITAL_BASED_OUTPATIENT_CLINIC_OR_DEPARTMENT_OTHER): Payer: Medicare Other

## 2015-10-24 ENCOUNTER — Ambulatory Visit (HOSPITAL_BASED_OUTPATIENT_CLINIC_OR_DEPARTMENT_OTHER): Payer: Medicare Other | Admitting: Nurse Practitioner

## 2015-10-24 ENCOUNTER — Telehealth: Payer: Self-pay | Admitting: Oncology

## 2015-10-24 ENCOUNTER — Encounter: Payer: Medicare Other | Admitting: Nutrition

## 2015-10-24 ENCOUNTER — Other Ambulatory Visit (HOSPITAL_COMMUNITY)
Admission: RE | Admit: 2015-10-24 | Discharge: 2015-10-24 | Disposition: A | Discharge status: Home / Self Care | Payer: Medicare Other | Source: Ambulatory Visit | Attending: Oncology | Admitting: Oncology

## 2015-10-24 ENCOUNTER — Ambulatory Visit: Payer: Medicare Other | Admitting: Oncology

## 2015-10-24 ENCOUNTER — Other Ambulatory Visit (HOSPITAL_BASED_OUTPATIENT_CLINIC_OR_DEPARTMENT_OTHER): Payer: Medicare Other

## 2015-10-24 VITALS — BP 113/58 | HR 94 | Temp 98.3°F | Resp 18 | Ht 66.0 in | Wt 137.8 lb

## 2015-10-24 DIAGNOSIS — C7910 Secondary malignant neoplasm of unspecified urinary organs: Secondary | ICD-10-CM | POA: Insufficient documentation

## 2015-10-24 DIAGNOSIS — C641 Malignant neoplasm of right kidney, except renal pelvis: Secondary | ICD-10-CM | POA: Diagnosis not present

## 2015-10-24 DIAGNOSIS — Z79899 Other long term (current) drug therapy: Secondary | ICD-10-CM

## 2015-10-24 DIAGNOSIS — C787 Secondary malignant neoplasm of liver and intrahepatic bile duct: Secondary | ICD-10-CM

## 2015-10-24 DIAGNOSIS — Z5112 Encounter for antineoplastic immunotherapy: Secondary | ICD-10-CM

## 2015-10-24 LAB — BASIC METABOLIC PANEL
ANION GAP: 13 (ref 5–15)
BUN: 40 mg/dL — ABNORMAL HIGH (ref 6–20)
CHLORIDE: 96 mmol/L — AB (ref 101–111)
CO2: 23 mmol/L (ref 22–32)
Calcium: 9.6 mg/dL (ref 8.9–10.3)
Creatinine, Ser: 1.11 mg/dL — ABNORMAL HIGH (ref 0.44–1.00)
GFR calc Af Amer: 51 mL/min — ABNORMAL LOW (ref >60)
GFR calc non Af Amer: 44 mL/min — ABNORMAL LOW (ref >60)
GLUCOSE: 114 mg/dL — AB (ref 65–99)
POTASSIUM: 5.1 mmol/L (ref 3.5–5.1)
Sodium: 132 mmol/L — ABNORMAL LOW (ref 135–145)

## 2015-10-24 LAB — CBC WITH DIFFERENTIAL/PLATELET
BASO%: 1.1 % (ref 0.0–2.0)
BASOS ABS: 0.1 10*3/uL (ref 0.0–0.1)
EOS%: 4.5 % (ref 0.0–7.0)
Eosinophils Absolute: 0.4 10*3/uL (ref 0.0–0.5)
HCT: 31.7 % — ABNORMAL LOW (ref 34.8–46.6)
HEMOGLOBIN: 10.3 g/dL — AB (ref 11.6–15.9)
LYMPH%: 18.3 % (ref 14.0–49.7)
MCH: 29 pg (ref 25.1–34.0)
MCHC: 32.5 g/dL (ref 31.5–36.0)
MCV: 89.3 fL (ref 79.5–101.0)
MONO#: 0.6 10*3/uL (ref 0.1–0.9)
MONO%: 6.6 % (ref 0.0–14.0)
NEUT#: 5.9 10*3/uL (ref 1.5–6.5)
NEUT%: 69.5 % (ref 38.4–76.8)
Platelets: 336 10*3/uL (ref 145–400)
RBC: 3.55 10*6/uL — ABNORMAL LOW (ref 3.70–5.45)
RDW: 16.4 % — AB (ref 11.2–14.5)
WBC: 8.5 10*3/uL (ref 3.9–10.3)
lymph#: 1.6 10*3/uL (ref 0.9–3.3)

## 2015-10-24 LAB — COMPREHENSIVE METABOLIC PANEL
ALBUMIN: 3.1 g/dL — AB (ref 3.5–5.0)
ALT: 35 U/L (ref 0–55)
AST: 47 U/L — AB (ref 5–34)
Alkaline Phosphatase: 281 U/L — ABNORMAL HIGH (ref 40–150)
Anion Gap: 10 mEq/L (ref 3–11)
BUN: 35.4 mg/dL — AB (ref 7.0–26.0)
CO2: 27 mEq/L (ref 22–29)
Calcium: 10.1 mg/dL (ref 8.4–10.4)
Chloride: 97 mEq/L — ABNORMAL LOW (ref 98–109)
Creatinine: 1.2 mg/dL — ABNORMAL HIGH (ref 0.6–1.1)
EGFR: 40 mL/min/{1.73_m2} — ABNORMAL LOW (ref >90)
GLUCOSE: 126 mg/dL (ref 70–140)
SODIUM: 134 meq/L — AB (ref 136–145)
Total Bilirubin: 0.4 mg/dL (ref 0.20–1.20)
Total Protein: 7.7 g/dL (ref 6.4–8.3)

## 2015-10-24 LAB — TSH: TSH: 1.343 m(IU)/L (ref 0.308–3.960)

## 2015-10-24 MED ORDER — PANTOPRAZOLE SODIUM 40 MG PO TBEC: 40.0000 mg | DELAYED_RELEASE_TABLET | Freq: Every day | ORAL | Status: AC

## 2015-10-24 MED ORDER — PANTOPRAZOLE SODIUM 40 MG PO TBEC: 40.0000 mg | DELAYED_RELEASE_TABLET | Freq: Every day | ORAL | Status: DC

## 2015-10-24 MED ORDER — SODIUM CHLORIDE 0.9 % IV SOLN
Freq: Once | INTRAVENOUS | Status: AC
Start: 2015-10-24 — End: 2015-10-24
  Administered 2015-10-24: 13:00:00 via INTRAVENOUS

## 2015-10-24 MED ORDER — SODIUM CHLORIDE 0.9 % IV SOLN
1200.0000 mg | Freq: Once | INTRAVENOUS | Status: AC
  Administered 2015-10-24: 1200 mg via INTRAVENOUS
  Filled 2015-10-24: qty 20

## 2015-10-24 NOTE — Patient Instructions (Signed)
[name] Discharge Instructions for Patients Receiving Chemotherapy  Today you received the following chemotherapy agents tecentriq.  To help prevent nausea and vomiting after your treatment, we encourage you to take your nausea medication zofran.   If you develop nausea and vomiting that is not controlled by your nausea medication, call the clinic.   BELOW ARE SYMPTOMS THAT SHOULD BE REPORTED IMMEDIATELY:  *FEVER GREATER THAN 100.5 F  *CHILLS WITH OR WITHOUT FEVER  NAUSEA AND VOMITING THAT IS NOT CONTROLLED WITH YOUR NAUSEA MEDICATION  *UNUSUAL SHORTNESS OF BREATH  *UNUSUAL BRUISING OR BLEEDING  TENDERNESS IN MOUTH AND THROAT WITH OR WITHOUT PRESENCE OF ULCERS  *URINARY PROBLEMS  *BOWEL PROBLEMS  UNUSUAL RASH Items with * indicate a potential emergency and should be followed up as soon as possible.  Feel free to call the clinic you have any questions or concerns. The clinic phone number is [phone].  Please show the [address] at check-in to the Emergency Department and triage nurse.

## 2015-10-24 NOTE — Progress Notes (Signed)
Kim Donovan OFFICE PROGRESS NOTE   Diagnosis:  Urothelial carcinoma  INTERVAL HISTORY:   Kim Donovan returns as scheduled. She completed cycle 1 Tecentriq on 10/03/2015. Appetite is better since resuming Marinol. No nausea or vomiting. No mouth sores. No diarrhea. She is intermittently constipated. She takes milk of magnesia as needed. The "burning" sensation over the buttocks is better. She denies back pain. The burning sensation does not radiate into the legs. No bowel or bladder dysfunction. Today she reports a "deep burning" sensation over the low abdomen not involving the skin. No rash. She reports Protonix was discontinued recently. She wonders if she should resume Protonix.  Objective:  Vital signs in last 24 hours:  Blood pressure 113/58, pulse 94, temperature 98.3 F (36.8 C), temperature source Oral, resp. rate 18, height 5\' 6"  (1.676 m), weight 137 lb 12.8 oz (62.506 kg), SpO2 100 %.    HEENT: No thrush or ulcers. Resp: Lungs clear bilaterally. Cardio: Regular rate and rhythm. GI: Abdomen is soft and nontender. No hepatomegaly. Vascular: Trace lower leg edema bilaterally right greater than left. Skin: No rash. Specifically no rash over the buttocks or lower abdomen. Resolving ecchymosis over the face.   Lab Results:  Lab Results  Component Value Date   WBC 8.5 10/24/2015   HGB 10.3* 10/24/2015   HCT 31.7* 10/24/2015   MCV 89.3 10/24/2015   PLT 336 10/24/2015   NEUTROABS 5.9 10/24/2015    Imaging:  No results found.  Medications: I have reviewed the patient's current medications.  Assessment/Plan: 1. Right renal mass  MRI 08/03/2015 with multiple suspicious liver lesions, replacement of the right kidney consistent with an infiltrative neoplasm, probable rectoperineal adenopathy, extension of tumor into the IVC, small right pleural effusion  Staging CTs 08/08/2015 with no lung metastases, infiltrative right renal mass extending into the right  renal vein and pararenal space, no lymphadenopathy, liver metastases  Ultrasound-guided biopsy of a right liver lesion 08/10/2015 with the preliminary pathology consistent with metastatic renal cell carcinoma, additional immunohistochemical histochemical stains most consistent with a poorly differentiated urothelial carcinoma  Initiation of pazopanib 08/17/2015, taken for 3 days  Cycle 1 Tecentriq 10/03/2015  Cycle 2 Tecentriq 10/24/2015 2. Right abdomen/flank discomfort secondary to #1  3. Family history of renal cell cancer  4. Anorexia/weight loss secondary to #1  5. Status post aortic valve replacement  6.Polymyalgia rheumatica- Embrel on hold  7. Admission 08/20/2015 with nausea/vomiting and fever - CT abdomen/pelvis revealed new fluid attenuation surrounding the gastric antrum and hepatic flexure, stable fluid collection on a repeat CT 08/23/2015  placement of a drainage catheter 08/28/2015 with bilious fluid obtained, removed 09/18/2015  8. Probable aspiration pneumonia January 2017  9. Duodenal obstruction noted on endoscopy 08/28/2015  10. Placement of an internal/external biliary drain 08/29/2015, converted to an internal stent 09/21/2015  11. Recent fevers. Question tumor fever, question infectious etiology.  12. Fall 10/11/2015. Left thumb edema and ecchymosis; facial/nose edema and ecchymosis.  13. Hyponatremia on labs 10/10/2015. Improved 10/12/2015. Improved 10/17/2015. Improved 10/24/2015.     Disposition: Kim Donovan appears stable. She has completed 1 cycle of Tecentriq. Plan to proceed with cycle 2 today as scheduled.  The potassium level was mildly elevated on labs today. We are obtaining a repeat sample.  She will resume Protonix to see if this helps with the "burning" sensation in the abdomen.  She will return for a follow-up visit and labs in 2 weeks.  Plan reviewed with Dr. Benay Spice.  Ned Card ANP/GNP-BC  10/24/2015   12:07 PM

## 2015-10-24 NOTE — Progress Notes (Signed)
VO given and read back with Ned Card NP- ok to treat today with results of CBC and CMET - will redraw lab for K+ to recheck. Patient and sister given print out on high potassium foods.

## 2015-10-24 NOTE — Telephone Encounter (Signed)
Gave and printed appt sched and avs for pt for March. °

## 2015-11-02 ENCOUNTER — Telehealth: Payer: Self-pay | Admitting: *Deleted

## 2015-11-02 NOTE — Telephone Encounter (Signed)
Call from pt requesting to increase Lasix dose to twice daily. She reports BLE are "swollen and heavy." Reviewed with Dr. Benay Spice: OK to increase dose to 40 mg.

## 2015-11-06 MED FILL — ONDANSETRON HCL 4 MG TABLET: 4 | 5 days supply | Qty: 20 | Fill #1

## 2015-11-07 ENCOUNTER — Other Ambulatory Visit (HOSPITAL_BASED_OUTPATIENT_CLINIC_OR_DEPARTMENT_OTHER): Payer: Medicare Other

## 2015-11-07 ENCOUNTER — Ambulatory Visit (HOSPITAL_BASED_OUTPATIENT_CLINIC_OR_DEPARTMENT_OTHER): Payer: Medicare Other | Admitting: Nurse Practitioner

## 2015-11-07 ENCOUNTER — Other Ambulatory Visit: Payer: Self-pay | Admitting: Nurse Practitioner

## 2015-11-07 ENCOUNTER — Telehealth: Payer: Self-pay | Admitting: Oncology

## 2015-11-07 VITALS — BP 131/69 | HR 84 | Temp 98.2°F | Resp 18 | Ht 66.0 in | Wt 140.4 lb

## 2015-11-07 DIAGNOSIS — C641 Malignant neoplasm of right kidney, except renal pelvis: Secondary | ICD-10-CM | POA: Diagnosis not present

## 2015-11-07 DIAGNOSIS — C7910 Secondary malignant neoplasm of unspecified urinary organs: Secondary | ICD-10-CM

## 2015-11-07 DIAGNOSIS — E871 Hypo-osmolality and hyponatremia: Secondary | ICD-10-CM | POA: Diagnosis not present

## 2015-11-07 DIAGNOSIS — C787 Secondary malignant neoplasm of liver and intrahepatic bile duct: Secondary | ICD-10-CM | POA: Diagnosis not present

## 2015-11-07 DIAGNOSIS — C642 Malignant neoplasm of left kidney, except renal pelvis: Secondary | ICD-10-CM | POA: Diagnosis not present

## 2015-11-07 DIAGNOSIS — Z79899 Other long term (current) drug therapy: Secondary | ICD-10-CM | POA: Diagnosis not present

## 2015-11-07 LAB — COMPREHENSIVE METABOLIC PANEL
ALK PHOS: 242 U/L — AB (ref 40–150)
ALT: 39 U/L (ref 0–55)
ANION GAP: 10 meq/L (ref 3–11)
AST: 55 U/L — ABNORMAL HIGH (ref 5–34)
Albumin: 3.3 g/dL — ABNORMAL LOW (ref 3.5–5.0)
BUN: 35.7 mg/dL — AB (ref 7.0–26.0)
CALCIUM: 10.3 mg/dL (ref 8.4–10.4)
CHLORIDE: 95 meq/L — AB (ref 98–109)
CO2: 29 meq/L (ref 22–29)
Creatinine: 1.3 mg/dL — ABNORMAL HIGH (ref 0.6–1.1)
EGFR: 39 mL/min/{1.73_m2} — AB (ref >90)
GLUCOSE: 96 mg/dL (ref 70–140)
POTASSIUM: 4.8 meq/L (ref 3.5–5.1)
Sodium: 134 mEq/L — ABNORMAL LOW (ref 136–145)
Total Bilirubin: 0.41 mg/dL (ref 0.20–1.20)
Total Protein: 8 g/dL (ref 6.4–8.3)

## 2015-11-07 LAB — CBC WITH DIFFERENTIAL/PLATELET
BASO%: 0.9 % (ref 0.0–2.0)
BASOS ABS: 0.1 10*3/uL (ref 0.0–0.1)
EOS ABS: 0.3 10*3/uL (ref 0.0–0.5)
EOS%: 4.5 % (ref 0.0–7.0)
HEMATOCRIT: 33.8 % — AB (ref 34.8–46.6)
HGB: 11.1 g/dL — ABNORMAL LOW (ref 11.6–15.9)
LYMPH#: 1.5 10*3/uL (ref 0.9–3.3)
LYMPH%: 26.4 % (ref 14.0–49.7)
MCH: 29.1 pg (ref 25.1–34.0)
MCHC: 32.8 g/dL (ref 31.5–36.0)
MCV: 88.5 fL (ref 79.5–101.0)
MONO#: 0.6 10*3/uL (ref 0.1–0.9)
MONO%: 11 % (ref 0.0–14.0)
NEUT#: 3.2 10*3/uL (ref 1.5–6.5)
NEUT%: 57.2 % (ref 38.4–76.8)
PLATELETS: 255 10*3/uL (ref 145–400)
RBC: 3.82 10*6/uL (ref 3.70–5.45)
RDW: 16.3 % — ABNORMAL HIGH (ref 11.2–14.5)
WBC: 5.5 10*3/uL (ref 3.9–10.3)

## 2015-11-07 LAB — TSH: TSH: 2.043 m(IU)/L (ref 0.308–3.960)

## 2015-11-07 MED ORDER — ALPRAZOLAM 0.25 MG PO TABS: 0.2500 mg | ORAL_TABLET | Freq: Every evening | ORAL | Status: DC | PRN

## 2015-11-07 NOTE — Progress Notes (Addendum)
[place] OFFICE PROGRESS NOTE   Diagnosis:  Urothelial carcinoma  INTERVAL HISTORY:   Kim Donovan returns as scheduled. She completed cycle 2 Tecentriq 10/24/2015. She denies nausea/vomiting. No mouth sores. No diarrhea. No rash. Appetite is better. She continues Marinol. She has intermittent "gas" discomfort. She tried Gas-X with good results. No bleeding. She notes increased leg edema. Some improvement with Lasix. She has recently been experiencing bilateral shoulder pain. The pain is similar to when she was diagnosed with polymyalgia rheumatica in 2012. She is taking Tylenol arthritis.  Objective:  Vital signs in last 24 hours:  Blood pressure 131/69, pulse 84, temperature 98.2 F (36.8 C), temperature source Oral, resp. rate 18, height 5\' 6"  (1.676 m), weight 140 lb 6.4 oz (63.685 kg), SpO2 99 %.    HEENT: No thrush or ulcers. Resp: Lungs clear bilaterally. Cardio: Regular rate and rhythm. GI: Abdomen soft and nontender. No hepatomegaly. Vascular: Trace edema lower legs bilaterally right greater than left.   Lab Results:  Lab Results  Component Value Date   WBC 5.5 11/07/2015   HGB 11.1* 11/07/2015   HCT 33.8* 11/07/2015   MCV 88.5 11/07/2015   PLT 255 11/07/2015   NEUTROABS 3.2 11/07/2015    Imaging:  No results found.  Medications: I have reviewed the patient's current medications.  Assessment/Plan: 1. Right renal mass  MRI 08/03/2015 with multiple suspicious liver lesions, replacement of the right kidney consistent with an infiltrative neoplasm, probable rectoperineal adenopathy, extension of tumor into the IVC, small right pleural effusion  Staging CTs 08/08/2015 with no lung metastases, infiltrative right renal mass extending into the right renal vein and pararenal space, no lymphadenopathy, liver metastases  Ultrasound-guided biopsy of a right liver lesion 08/10/2015 with the preliminary pathology consistent with metastatic renal cell  carcinoma, additional immunohistochemical histochemical stains most consistent with a poorly differentiated urothelial carcinoma  Initiation of pazopanib 08/17/2015, taken for 3 days  Cycle 1 Tecentriq 10/03/2015  Cycle 2 Tecentriq 10/24/2015 2. Right abdomen/flank discomfort secondary to #1  3. Family history of renal cell cancer  4. Anorexia/weight loss secondary to #1  5. Status post aortic valve replacement  6.Polymyalgia rheumatica- Embrel on hold  7. Admission 08/20/2015 with nausea/vomiting and fever - CT abdomen/pelvis revealed new fluid attenuation surrounding the gastric antrum and hepatic flexure, stable fluid collection on a repeat CT 08/23/2015  placement of a drainage catheter 08/28/2015 with bilious fluid obtained, removed 09/18/2015  8. Probable aspiration pneumonia January 2017  9. Duodenal obstruction noted on endoscopy 08/28/2015  10. Placement of an internal/external biliary drain 08/29/2015, converted to an internal stent 09/21/2015  11. Fall 10/11/2015. Left thumb edema and ecchymosis; facial/nose edema and ecchymosis. Resolved.  12. Hyponatremia on labs 10/10/2015. Improved 10/12/2015. Improved 10/17/2015. Improved 10/24/2015.     Disposition: Kim Donovan appears stable. She has completed 2 cycles of Tecentriq. She is scheduled to return for cycle 3 in one week. She continues to work on Occupational psychologist and activity.  For the shoulder pain she will continue Tylenol arthritis. She understands the rationale regarding avoidance of steroids if at all possible while on Tecentriq.   She will return for a follow-up visit on 11/26/2015. She will contact the office in the interim with any problems.  Patient seen with Dr. Benay Spice.    Ned Card ANP/GNP-BC   11/07/2015  11:11 AM  This was a shared visit with Ned Card. Kim Donovan appears to be tolerating the Tencentriq well. I suspect the lower extremity edema is related  to a pressure  effect from the large renal mass. She will return for an office visit in approximately 3 weeks.  Julieanne Manson, M.D.

## 2015-11-07 NOTE — Telephone Encounter (Signed)
GAVE AND PRINTED APPT SHCED AND AVS FO RPT FOR [facility] AND April

## 2015-11-09 ENCOUNTER — Telehealth: Payer: Self-pay | Admitting: *Deleted

## 2015-11-09 NOTE — Telephone Encounter (Signed)
Rerouted to appropriate pod

## 2015-11-09 NOTE — Telephone Encounter (Signed)
Not my patient

## 2015-11-09 NOTE — Telephone Encounter (Signed)
This RN spoke with pt per her call to [facility] and results of lab given.  No other concerns at this time.

## 2015-11-09 NOTE — Telephone Encounter (Signed)
Pt called to [facility] to ask " what can I take to break up my cough ?"  Per discussion pt states cough " is rattling in my throat but I cannot get it loosened up "  Pt denies any fevers.  She does state " my ears are stopped up so I cannot hear you well "  Per above this RN recommended for pt to use mucinex DM ( but ask pharmacist for store brand due to less cost ). Discussed above works best by pt drinking lots of fluid.  Kim Donovan verbalized understanding as well as to call if symptoms do not improve or worsen.

## 2015-11-10 ENCOUNTER — Emergency Department (HOSPITAL_COMMUNITY): Payer: Medicare Other

## 2015-11-10 ENCOUNTER — Inpatient Hospital Stay (HOSPITAL_COMMUNITY)
Admission: EM | Admit: 2015-11-10 | Discharge: 2015-11-14 | DRG: 871 | Disposition: A | Discharge status: Home Health | Payer: Medicare Other | Attending: Internal Medicine | Admitting: Internal Medicine

## 2015-11-10 ENCOUNTER — Encounter (HOSPITAL_COMMUNITY): Payer: Self-pay | Admitting: *Deleted

## 2015-11-10 DIAGNOSIS — E785 Hyperlipidemia, unspecified: Secondary | ICD-10-CM | POA: Diagnosis present

## 2015-11-10 DIAGNOSIS — R6520 Severe sepsis without septic shock: Secondary | ICD-10-CM | POA: Diagnosis present

## 2015-11-10 DIAGNOSIS — R6889 Other general symptoms and signs: Secondary | ICD-10-CM | POA: Insufficient documentation

## 2015-11-10 DIAGNOSIS — Y95 Nosocomial condition: Secondary | ICD-10-CM | POA: Diagnosis present

## 2015-11-10 DIAGNOSIS — Z87891 Personal history of nicotine dependence: Secondary | ICD-10-CM

## 2015-11-10 DIAGNOSIS — Z823 Family history of stroke: Secondary | ICD-10-CM | POA: Diagnosis not present

## 2015-11-10 DIAGNOSIS — M1990 Unspecified osteoarthritis, unspecified site: Secondary | ICD-10-CM | POA: Diagnosis present

## 2015-11-10 DIAGNOSIS — E871 Hypo-osmolality and hyponatremia: Secondary | ICD-10-CM | POA: Diagnosis present

## 2015-11-10 DIAGNOSIS — Z66 Do not resuscitate: Secondary | ICD-10-CM | POA: Diagnosis present

## 2015-11-10 DIAGNOSIS — C7910 Secondary malignant neoplasm of unspecified urinary organs: Secondary | ICD-10-CM | POA: Diagnosis present

## 2015-11-10 DIAGNOSIS — J189 Pneumonia, unspecified organism: Secondary | ICD-10-CM | POA: Diagnosis present

## 2015-11-10 DIAGNOSIS — Z7982 Long term (current) use of aspirin: Secondary | ICD-10-CM | POA: Diagnosis not present

## 2015-11-10 DIAGNOSIS — Z8249 Family history of ischemic heart disease and other diseases of the circulatory system: Secondary | ICD-10-CM

## 2015-11-10 DIAGNOSIS — I129 Hypertensive chronic kidney disease with stage 1 through stage 4 chronic kidney disease, or unspecified chronic kidney disease: Secondary | ICD-10-CM | POA: Diagnosis present

## 2015-11-10 DIAGNOSIS — C641 Malignant neoplasm of right kidney, except renal pelvis: Secondary | ICD-10-CM | POA: Diagnosis not present

## 2015-11-10 DIAGNOSIS — C787 Secondary malignant neoplasm of liver and intrahepatic bile duct: Secondary | ICD-10-CM | POA: Diagnosis present

## 2015-11-10 DIAGNOSIS — A419 Sepsis, unspecified organism: Principal | ICD-10-CM | POA: Diagnosis present

## 2015-11-10 DIAGNOSIS — I739 Peripheral vascular disease, unspecified: Secondary | ICD-10-CM | POA: Diagnosis present

## 2015-11-10 DIAGNOSIS — R0602 Shortness of breath: Secondary | ICD-10-CM

## 2015-11-10 DIAGNOSIS — R5383 Other fatigue: Secondary | ICD-10-CM | POA: Diagnosis present

## 2015-11-10 DIAGNOSIS — E872 Acidosis: Secondary | ICD-10-CM | POA: Diagnosis present

## 2015-11-10 DIAGNOSIS — J069 Acute upper respiratory infection, unspecified: Secondary | ICD-10-CM | POA: Diagnosis not present

## 2015-11-10 DIAGNOSIS — R531 Weakness: Secondary | ICD-10-CM | POA: Diagnosis not present

## 2015-11-10 DIAGNOSIS — R509 Fever, unspecified: Secondary | ICD-10-CM | POA: Diagnosis not present

## 2015-11-10 DIAGNOSIS — C649 Malignant neoplasm of unspecified kidney, except renal pelvis: Secondary | ICD-10-CM | POA: Diagnosis present

## 2015-11-10 DIAGNOSIS — Z952 Presence of prosthetic heart valve: Secondary | ICD-10-CM

## 2015-11-10 DIAGNOSIS — N183 Chronic kidney disease, stage 3 (moderate): Secondary | ICD-10-CM | POA: Diagnosis present

## 2015-11-10 DIAGNOSIS — M353 Polymyalgia rheumatica: Secondary | ICD-10-CM | POA: Diagnosis present

## 2015-11-10 DIAGNOSIS — R110 Nausea: Secondary | ICD-10-CM | POA: Diagnosis not present

## 2015-11-10 HISTORY — DX: Unspecified glaucoma: H40.9

## 2015-11-10 HISTORY — DX: Adult hypertrophic pyloric stenosis: K31.1

## 2015-11-10 LAB — URINE MICROSCOPIC-ADD ON
Bacteria, UA: NONE SEEN
Squamous Epithelial / LPF: NONE SEEN
WBC UA: NONE SEEN WBC/hpf (ref 0–5)

## 2015-11-10 LAB — CBC
HEMATOCRIT: 28.3 % — AB (ref 36.0–46.0)
HEMOGLOBIN: 9.6 g/dL — AB (ref 12.0–15.0)
MCH: 28.8 pg (ref 26.0–34.0)
MCHC: 33.9 g/dL (ref 30.0–36.0)
MCV: 85 fL (ref 78.0–100.0)
Platelets: 214 10*3/uL (ref 150–400)
RBC: 3.33 MIL/uL — AB (ref 3.87–5.11)
RDW: 15.9 % — ABNORMAL HIGH (ref 11.5–15.5)
WBC: 6.9 10*3/uL (ref 4.0–10.5)

## 2015-11-10 LAB — CREATININE, SERUM
Creatinine, Ser: 1.21 mg/dL — ABNORMAL HIGH (ref 0.44–1.00)
GFR, EST AFRICAN AMERICAN: 46 mL/min — AB (ref >60)
GFR, EST NON AFRICAN AMERICAN: 40 mL/min — AB (ref >60)

## 2015-11-10 LAB — COMPREHENSIVE METABOLIC PANEL
ALK PHOS: 242 U/L — AB (ref 38–126)
ALT: 40 U/L (ref 14–54)
AST: 65 U/L — ABNORMAL HIGH (ref 15–41)
Albumin: 3.7 g/dL (ref 3.5–5.0)
Anion gap: 12 (ref 5–15)
BILIRUBIN TOTAL: 0.8 mg/dL (ref 0.3–1.2)
BUN: 30 mg/dL — ABNORMAL HIGH (ref 6–20)
CALCIUM: 9.3 mg/dL (ref 8.9–10.3)
CO2: 24 mmol/L (ref 22–32)
CREATININE: 1.17 mg/dL — AB (ref 0.44–1.00)
Chloride: 93 mmol/L — ABNORMAL LOW (ref 101–111)
GFR, EST AFRICAN AMERICAN: 48 mL/min — AB (ref >60)
GFR, EST NON AFRICAN AMERICAN: 42 mL/min — AB (ref >60)
Glucose, Bld: 113 mg/dL — ABNORMAL HIGH (ref 65–99)
Potassium: 4.6 mmol/L (ref 3.5–5.1)
Sodium: 129 mmol/L — ABNORMAL LOW (ref 135–145)
Total Protein: 8.1 g/dL (ref 6.5–8.1)

## 2015-11-10 LAB — INFLUENZA PANEL BY PCR (TYPE A & B)
H1N1 flu by pcr: NOT DETECTED
INFLAPCR: NEGATIVE
INFLBPCR: NEGATIVE

## 2015-11-10 LAB — URINALYSIS, ROUTINE W REFLEX MICROSCOPIC
BILIRUBIN URINE: NEGATIVE
Glucose, UA: NEGATIVE mg/dL
KETONES UR: NEGATIVE mg/dL
Leukocytes, UA: NEGATIVE
NITRITE: NEGATIVE
Protein, ur: NEGATIVE mg/dL
Specific Gravity, Urine: 1.008 (ref 1.005–1.030)
pH: 7 (ref 5.0–8.0)

## 2015-11-10 LAB — CBC WITH DIFFERENTIAL/PLATELET
BASOS PCT: 1 %
Basophils Absolute: 0.1 10*3/uL (ref 0.0–0.1)
EOS PCT: 0 %
Eosinophils Absolute: 0 10*3/uL (ref 0.0–0.7)
HCT: 31.1 % — ABNORMAL LOW (ref 36.0–46.0)
HEMOGLOBIN: 10.7 g/dL — AB (ref 12.0–15.0)
LYMPHS PCT: 14 %
Lymphs Abs: 1 10*3/uL (ref 0.7–4.0)
MCH: 28.8 pg (ref 26.0–34.0)
MCHC: 34.4 g/dL (ref 30.0–36.0)
MCV: 83.8 fL (ref 78.0–100.0)
MONO ABS: 0.8 10*3/uL (ref 0.1–1.0)
Monocytes Relative: 11 %
NEUTROS PCT: 74 %
Neutro Abs: 5 10*3/uL (ref 1.7–7.7)
Platelets: 259 10*3/uL (ref 150–400)
RBC: 3.71 MIL/uL — ABNORMAL LOW (ref 3.87–5.11)
RDW: 15.7 % — ABNORMAL HIGH (ref 11.5–15.5)
WBC: 6.9 10*3/uL (ref 4.0–10.5)

## 2015-11-10 LAB — LACTIC ACID, PLASMA
LACTIC ACID, VENOUS: 0.9 mmol/L (ref 0.5–2.0)
Lactic Acid, Venous: 0.8 mmol/L (ref 0.5–2.0)

## 2015-11-10 LAB — PROTIME-INR
INR: 1.24 (ref 0.00–1.49)
PROTHROMBIN TIME: 15.3 s — AB (ref 11.6–15.2)

## 2015-11-10 LAB — PROCALCITONIN: PROCALCITONIN: 0.33 ng/mL

## 2015-11-10 LAB — SEDIMENTATION RATE: SED RATE: 62 mm/h — AB (ref 0–22)

## 2015-11-10 LAB — APTT: aPTT: 47 seconds — ABNORMAL HIGH (ref 24–37)

## 2015-11-10 LAB — MRSA PCR SCREENING: MRSA BY PCR: NEGATIVE

## 2015-11-10 LAB — I-STAT CG4 LACTIC ACID, ED: Lactic Acid, Venous: 0.65 mmol/L (ref 0.5–2.0)

## 2015-11-10 MED ORDER — CALCIUM CITRATE-VITAMIN D 250-200 MG-UNIT PO TABS
2.0000 | ORAL_TABLET | Freq: Every day | ORAL | Status: DC
Start: 2015-11-10 — End: 2015-11-10

## 2015-11-10 MED ORDER — OSELTAMIVIR PHOSPHATE 30 MG PO CAPS
30.0000 mg | ORAL_CAPSULE | Freq: Two times a day (BID) | ORAL | Status: DC
  Administered 2015-11-10: 30 mg via ORAL
  Filled 2015-11-10 (×3): qty 1

## 2015-11-10 MED ORDER — SODIUM CHLORIDE 0.9 % IV BOLUS (SEPSIS)
1000.0000 mL | Freq: Once | INTRAVENOUS | Status: AC
  Administered 2015-11-10: 1000 mL via INTRAVENOUS

## 2015-11-10 MED ORDER — DM-GUAIFENESIN ER 30-600 MG PO TB12
1.0000 | ORAL_TABLET | Freq: Two times a day (BID) | ORAL | Status: DC | PRN
  Administered 2015-11-10 – 2015-11-12 (×3): 1 via ORAL
  Filled 2015-11-10 (×3): qty 1

## 2015-11-10 MED ORDER — DORZOLAMIDE HCL-TIMOLOL MAL 2-0.5 % OP SOLN
1.0000 [drp] | Freq: Two times a day (BID) | OPHTHALMIC | Status: DC
  Administered 2015-11-10 – 2015-11-14 (×8): 1 [drp] via OPHTHALMIC
  Filled 2015-11-10: qty 10

## 2015-11-10 MED ORDER — ONDANSETRON HCL 4 MG PO TABS
4.0000 mg | ORAL_TABLET | Freq: Four times a day (QID) | ORAL | Status: DC | PRN
  Administered 2015-11-11: 4 mg via ORAL
  Filled 2015-11-10: qty 1

## 2015-11-10 MED ORDER — VANCOMYCIN HCL IN DEXTROSE 1-5 GM/200ML-% IV SOLN
1000.0000 mg | INTRAVENOUS | Status: AC
  Administered 2015-11-10: 1000 mg via INTRAVENOUS
  Filled 2015-11-10: qty 200

## 2015-11-10 MED ORDER — VANCOMYCIN HCL IN DEXTROSE 1-5 GM/200ML-% IV SOLN
1000.0000 mg | INTRAVENOUS | Status: DC
  Administered 2015-11-11 – 2015-11-13 (×3): 1000 mg via INTRAVENOUS
  Filled 2015-11-10 (×3): qty 200

## 2015-11-10 MED ORDER — OMEGA-3-ACID ETHYL ESTERS 1 G PO CAPS
1.0000 g | ORAL_CAPSULE | Freq: Every day | ORAL | Status: DC
  Administered 2015-11-10 – 2015-11-14 (×5): 1 g via ORAL
  Filled 2015-11-10 (×6): qty 1

## 2015-11-10 MED ORDER — PIPERACILLIN-TAZOBACTAM 3.375 G IVPB 30 MIN
3.3750 g | Freq: Once | INTRAVENOUS | Status: AC
  Administered 2015-11-10: 3.375 g via INTRAVENOUS
  Filled 2015-11-10: qty 50

## 2015-11-10 MED ORDER — ASPIRIN EC 81 MG PO TBEC
81.0000 mg | DELAYED_RELEASE_TABLET | Freq: Every day | ORAL | Status: DC
  Administered 2015-11-10 – 2015-11-14 (×5): 81 mg via ORAL
  Filled 2015-11-10 (×5): qty 1

## 2015-11-10 MED ORDER — ACETAMINOPHEN 500 MG PO TABS
1000.0000 mg | ORAL_TABLET | Freq: Once | ORAL | Status: AC
  Administered 2015-11-10: 1000 mg via ORAL
  Filled 2015-11-10: qty 2

## 2015-11-10 MED ORDER — ASPIRIN 81 MG PO TABS: 81.0000 mg | ORAL_TABLET | Freq: Every day | ORAL | Status: DC

## 2015-11-10 MED ORDER — EZETIMIBE 10 MG PO TABS
10.0000 mg | ORAL_TABLET | Freq: Every day | ORAL | Status: DC
  Administered 2015-11-10 – 2015-11-14 (×5): 10 mg via ORAL
  Filled 2015-11-10 (×6): qty 1

## 2015-11-10 MED ORDER — PIPERACILLIN-TAZOBACTAM 3.375 G IVPB
3.3750 g | Freq: Three times a day (TID) | INTRAVENOUS | Status: DC
  Administered 2015-11-10 – 2015-11-13 (×8): 3.375 g via INTRAVENOUS
  Filled 2015-11-10 (×7): qty 50

## 2015-11-10 MED ORDER — ONDANSETRON HCL 4 MG/2ML IJ SOLN
4.0000 mg | Freq: Once | INTRAMUSCULAR | Status: AC
  Administered 2015-11-10: 4 mg via INTRAVENOUS
  Filled 2015-11-10: qty 2

## 2015-11-10 MED ORDER — SODIUM CHLORIDE 0.9 % IV SOLN
INTRAVENOUS | Status: DC
  Administered 2015-11-10 – 2015-11-13 (×4): via INTRAVENOUS

## 2015-11-10 MED ORDER — SODIUM CHLORIDE 0.9% FLUSH: 3.0000 mL | Freq: Two times a day (BID) | INTRAVENOUS | Status: DC

## 2015-11-10 MED ORDER — PANTOPRAZOLE SODIUM 40 MG PO TBEC
40.0000 mg | DELAYED_RELEASE_TABLET | Freq: Every day | ORAL | Status: DC
  Administered 2015-11-10 – 2015-11-14 (×5): 40 mg via ORAL
  Filled 2015-11-10 (×5): qty 1

## 2015-11-10 MED ORDER — ENOXAPARIN SODIUM 40 MG/0.4ML ~~LOC~~ SOLN
40.0000 mg | SUBCUTANEOUS | Status: DC
  Administered 2015-11-10 – 2015-11-13 (×4): 40 mg via SUBCUTANEOUS
  Filled 2015-11-10 (×4): qty 0.4

## 2015-11-10 MED ORDER — CALCIUM CARBONATE-VITAMIN D 500-200 MG-UNIT PO TABS
2.0000 | ORAL_TABLET | Freq: Every day | ORAL | Status: DC
  Administered 2015-11-10 – 2015-11-14 (×5): 2 via ORAL
  Filled 2015-11-10 (×5): qty 2

## 2015-11-10 MED ORDER — OSELTAMIVIR PHOSPHATE 75 MG PO CAPS: 75.0000 mg | ORAL_CAPSULE | Freq: Two times a day (BID) | ORAL | Status: DC

## 2015-11-10 MED ORDER — CENTRUM SILVER ULTRA WOMENS PO TABS: 1.0000 | ORAL_TABLET | Freq: Every day | ORAL | Status: DC

## 2015-11-10 MED ORDER — ADULT MULTIVITAMIN W/MINERALS CH
1.0000 | ORAL_TABLET | Freq: Every day | ORAL | Status: DC
  Administered 2015-11-10 – 2015-11-14 (×5): 1 via ORAL
  Filled 2015-11-10 (×5): qty 1

## 2015-11-10 MED ORDER — ALPRAZOLAM 0.25 MG PO TABS
0.2500 mg | ORAL_TABLET | Freq: Every evening | ORAL | Status: DC | PRN
  Administered 2015-11-10 – 2015-11-13 (×3): 0.25 mg via ORAL
  Filled 2015-11-10 (×3): qty 1

## 2015-11-10 MED ORDER — DRONABINOL 2.5 MG PO CAPS
2.5000 mg | ORAL_CAPSULE | Freq: Two times a day (BID) | ORAL | Status: DC
  Administered 2015-11-10 – 2015-11-14 (×7): 2.5 mg via ORAL
  Filled 2015-11-10 (×7): qty 1

## 2015-11-10 MED ORDER — DM-GUAIFENESIN ER 30-600 MG PO TB12: 1.0000 | ORAL_TABLET | ORAL | Status: DC | PRN

## 2015-11-10 NOTE — H&P (Signed)
History and Physical:    Kim Donovan   BDZ:[id number] DOB: 12-09-1928 DOA: 11/10/2015  Referring MD/provider: Dr. Tyrone Nine PCP: Mathews Argyle, MD   Chief Complaint: Fever, weakness  History of Present Illness:   Kim Donovan is an 82 y.o. female with a PMH of polymyalgia rheumatica, peripheral vascular disease, hypertension, and renal cell carcinoma with liver metastases on immunotherapy who presents to the hospital with a chief complaint of upper respiratory type symptoms for the last week. Symptoms have included nasal and chest congestion, sore throat, and a moist cough without expectoration. She also describes worsening fatigue. Over the past 24 hours, she started to run fevers, and since she is on chronic immunotherapy, she knows to call her MD, and when this occurred, she came to the ED for further evaluation. No specific aggravating or alleviating factors.  Upon initial evaluation in the ED, she was noted to be hypotensive with blood pressure in the 34H systolic. Blood pressure responded to fluid challenge. Chest x-ray was clear. Urinalysis negative for signs of infection. WBC 6.9. Lactic acid 0.65.   ROS:   Review of Systems  Constitutional: Positive for fever and malaise/fatigue. Negative for chills and weight loss.  HENT: Positive for congestion and sore throat.   Eyes: Negative for discharge and redness.  Respiratory: Positive for cough. Negative for shortness of breath.   Cardiovascular: Negative for chest pain.  Gastrointestinal: Positive for nausea and vomiting. Negative for heartburn, abdominal pain, diarrhea, blood in stool and melena.  Genitourinary: Positive for urgency and frequency. Negative for dysuria.       On Lasix.  Musculoskeletal: Positive for myalgias, joint pain and falls.  Skin: Negative.   Neurological: Positive for weakness. Negative for headaches.  Endo/Heme/Allergies: Negative.   Psychiatric/Behavioral: Negative.      Past Medical  History:   Past Medical History  Diagnosis Date  . Hyperlipidemia   . Osteoarthritis   . Osteoporosis/osteopenia increased risk   . Glaucoma   . Aortic stenosis     myoview neg ischemia 11/2006  ---- echo 11/10: mean AVA gradient~47   ---- s/p bioprosthetic AVR 12/2009  . Polymyalgia rheumatica ([place]) 11/05/2010  . PVD (peripheral vascular disease) (HCC)     left leg swells  . Hypertension     pt. states does not have HTN, no meds  . Renal cell carcinoma ([place]) 07/2016    with liver mets, liver biopsy confirmed urothelial origin.   . Gastric outlet obstruction   . GLAUCOMA 05/29/2009    Qualifier: Diagnosis of  By: Regis Bill MD, Standley Brooking     Past Surgical History:   Past Surgical History  Procedure Laterality Date  . Appendectomy    . Tonsillectomy    . Cholecystectomy  1958  . Cardiac valve replacement  May 2011    aortic valve  Porcine  . Carpal tunnel release  12/2010    left hand  . Cataract/glaucoma surgery  11/2010    mccuen and bond  . Breast surgery  1985    bx  . Abdominal hysterectomy      for fibroids  . Mastectomy      was only for removal of cysts, benign  . Eye surgery  01/2011    for glaucoma  . Carpal tunnel release  08/29/2011    Procedure: CARPAL TUNNEL RELEASE;  Surgeon: Cammie Sickle., MD;  Location: [place];  Service: Orthopedics;  Laterality: Right;  . Esophagogastroduodenoscopy (egd) with propofol N/A 08/28/2015  Procedure: ESOPHAGOGASTRODUODENOSCOPY (EGD) WITH PROPOFOL;  Surgeon: Ladene Artist, MD;  [facility];  Service: Endoscopy;  Laterality: N/A;  . Esophagogastroduodenoscopy N/A 08/31/2015    Procedure: ESOPHAGOGASTRODUODENOSCOPY (EGD), with stent;  Surgeon: Jerene Bears, MD;  [facility];  Service: Gastroenterology;  Laterality: N/A;    Social History:   Social History   Social History  . Marital Status: Married    Spouse Name: N/A  . Number of Children: 0  . Years of Education: N/A    Occupational History  . Not on file.   Social History Main Topics  . Smoking status: Former Smoker -- 0.50 packs/day    Types: Cigarettes    Quit date: 08/18/1978  . Smokeless tobacco: Not on file  . Alcohol Use: No  . Drug Use: Not on file  . Sexual Activity: Not on file   Other Topics Concern  . Not on file   Social History Narrative   Married, lives with husband.   Walks   HH of 2   No pets   Social ocass etoh             Family history:   Family History  Problem Relation Age of Onset  . Deep vein thrombosis Mother   . Heart attack Father   . Stroke Father   . Osteoporosis      aunt    Allergies   Tramadol  Current Medications:   Prior to Admission medications   Medication Sig Start Date End Date Taking? Authorizing Provider  ALPRAZolam (XANAX) 0.25 MG tablet Take 1 tablet (0.25 mg total) by mouth at bedtime as needed for anxiety. 11/07/15  Yes Owens Shark, NP  aspirin 81 MG tablet Take 81 mg by mouth daily.     Yes Historical Provider, MD  Calcium Citrate-Vitamin D 250-200 MG-UNIT TABS Take 2 tablets by mouth daily.    Yes Historical Provider, MD  dextromethorphan-guaiFENesin (MUCINEX DM) 30-600 MG 12hr tablet Take 1 tablet by mouth every 4 (four) hours as needed for cough.   Yes Historical Provider, MD  Diphenhydramine-APAP, sleep, (TYLENOL PM EXTRA STRENGTH PO) Take 1 tablet by mouth at bedtime as needed (pain/sleep).    Yes Historical Provider, MD  dorzolamide-timolol (COSOPT) 22.3-6.8 MG/ML ophthalmic solution Place 1 drop into the left eye 2 (two) times daily.    Yes Historical Provider, MD  dronabinol (MARINOL) 2.5 MG capsule Take 1 capsule (2.5 mg total) by mouth 2 (two) times daily before lunch and supper. 10/17/15  Yes Owens Shark, NP  ezetimibe (ZETIA) 10 MG tablet Take 1 tablet (10 mg total) by mouth daily. 05/12/14  Yes Jolaine Artist, MD  furosemide (LASIX) 20 MG tablet Take 1 tablet (20 mg total) by mouth daily. Patient taking  differently: Take 20-40 mg by mouth daily.  05/15/15  Yes Larey Dresser, MD  Multiple Vitamins-Minerals (CENTRUM SILVER ULTRA WOMENS PO) Take 1 tablet by mouth daily.     Yes Historical Provider, MD  Omega-3 Fatty Acids (FISH OIL) 1000 MG CAPS Take 1 capsule by mouth daily. Reported on 08/06/2015   Yes Historical Provider, MD  ondansetron (ZOFRAN) 4 MG tablet Take 1 tablet (4 mg total) by mouth every 6 (six) hours as needed for nausea. 10/03/15  Yes Ladell Pier, MD  pantoprazole (PROTONIX) 40 MG tablet Take 1 tablet (40 mg total) by mouth daily. 10/24/15  Yes Owens Shark, NP    Physical Exam:   Filed Vitals:   11/10/15  1341 11/10/15 1400 11/10/15 1430 11/10/15 1500  BP: 96/56 105/65 101/62 93/62  Pulse: 87 86 80 94  Temp:      TempSrc:      Resp: _0 SpO2: 95% 96% 98% 77%     Physical Exam: Blood pressure 93/62, pulse 94, temperature 99.1 F (37.3 C), temperature source Oral, resp. rate 23, SpO2 77 %. Gen: No acute distress. Head: Normocephalic, atraumatic. Eyes: PERRL, EOMI, sclerae nonicteric. Mouth: Oropharynx clear, good dentition for age. Neck: Supple, no thyromegaly, no lymphadenopathy, no jugular venous distention. Chest: Diminished breath sounds in the right lower lobe. CV: Heart sounds Abdomen: Soft, nontender, nondistended with normal active bowel sounds. Extremities: Extremities with 2+ edemaIn lower extremity erythema. Skin: Warm and dry. Neuro: Alert and oriented times 3; grossly nonfocal.  Psych: Mood and affect normal.   Data Review:    Labs: Basic Metabolic Panel:  Recent Labs Lab 11/07/15 0952 11/10/15 1030  NA 134* 129*  K 4.8 4.6  CL  --  93*  CO2 29 24  GLUCOSE 96 113*  BUN 35.7* 30*  CREATININE 1.3* 1.17*  CALCIUM 10.3 9.3   Liver Function Tests:  Recent Labs Lab 11/07/15 0952 11/10/15 1030  AST 55* 65*  ALT 39 40  ALKPHOS 242* 242*  BILITOT 0.41 0.8  PROT 8.0 8.1  ALBUMIN 3.3* 3.7   CBC:  Recent Labs Lab  11/07/15 0952 11/10/15 1030  WBC 5.5 6.9  NEUTROABS 3.2 5.0  HGB 11.1* 10.7*  HCT 33.8* 31.1*  MCV 88.5 83.8  PLT 255 259    Radiographic Studies: Dg Chest 2 View  11/10/2015  CLINICAL DATA:  Patient states she has had a dry cough x 1 week. Hx of bronchitis, heart valve replacement, previous smoker, PVD, aortic stenosis, hypertension. EXAM: CHEST  2 VIEW COMPARISON:  10/10/2015 FINDINGS: Sternotomy wires overlie normal cardiac silhouette. Prosthetic valve noted. No effusion, infiltrate pneumothorax. Lungs are hyperinflated. IMPRESSION: Hyperinflated lungs.  No acute findings. Electronically Signed   By: Suzy Bouchard M.D.   On: 11/10/2015 11:06    EKG: Not done.   Assessment/Plan:   Principal Problem:   Possible Severe sepsis (HCC) versus influenza-like illness with hypotension/Chronic immunosuppression - Sepsis order set utilized in ED.  - Meets 2 or more SIRS criteria (? Temp > 100.9 <96.8; HR >90; RR >20; PaCO2<32; WBC >12K<4K or >10 %bands AND has evidence of acute organ failure (lactic acidosis, oliguria, ALI, ARDS, coagulopathy/DIC, AMS, hypotension/shock). Fever not documented in the ED, but reports fever greater than 10 35F at home. - This patient is at high risk of poor outcomes with a SOFA score of 2. - Source thought to be from influenza versus early pneumonia based on symptoms.   - U/A clear, CXR shows no acute findings. - Send blood cultures. Urinalysis clear. Not making sputum. - WBC is WNL, lactic acid is WNL.  Check pro-calcitoninin. - Fluid volume resuscitated with 30 mg/kg using weight based algorithm per sepsis order set. - We'll start broad-spectrum antibiotics with vancomycin and Zosyn given chronic immunosuppression. - Start empiric Tamiflu while awaiting influenza screen. - Narrow in the face of clinical improvement or based on culture data.  Active Problems:   Stage III chronic kidney disease - Creatinine consistent with usual baseline values.     Fatigue/history of PMR - Has history of polymyalgia rheumatica which may be contributory. Check ESR. - Also reports shoulder pain, which is similar to when she was diagnosed with PMR in 2012.  Hyponatremia - Has been a chronic issue. Hydrate and follow.    Metastatic urothelial carcinoma ([place]) with hepatic metastasis - Completed cycle 2 Tecentriq 10/24/2015. Third cycle planned 11/14/15. - We'll notify Dr. Benay Spice of the patient's admission.  DVT prophylaxis - Lovenox ordered.  Code Status / Family Communication / Disposition Plan:   Code Status: DNR. Family Communication: No family currently at the bedside. Disposition Plan: Home when stable, lives with her husband.  Time spent: 1 hour.  RAMA,CHRISTINA [facility] Pager [phone] Cell: [phone]   If 7PM-7AM, please contact night-coverage [email or web address] Password [email or web address] 11/10/2015, 4:20 PM

## 2015-11-10 NOTE — ED Notes (Signed)
Per pt and family, pt is cancer pt, on chemo, developed chest congestion, and cough earlier in the week, followed by fever and today started having nausea and vomiting. Pt reports feeling weak more than usual

## 2015-11-10 NOTE — ED Provider Notes (Signed)
CSN: [id number]     Arrival date & time 11/10/15  [id number] History   First MD Initiated Contact with Patient 11/10/15 1012     Chief Complaint  Patient presents with  . Fever  . Cough  . Emesis  . chemo card     (Consider location/radiation/quality/duration/timing/severity/associated sxs/prior Treatment) Patient is a 82 y.o. female presenting with fever, cough, vomiting, and general illness. The history is provided by the patient.  Fever Associated symptoms: congestion, cough, headaches, nausea, rhinorrhea and vomiting   Associated symptoms: no chest pain, no chills, no dysuria and no myalgias   Cough Associated symptoms: fever, headaches and rhinorrhea   Associated symptoms: no chest pain, no chills, no myalgias, no shortness of breath and no wheezing   Emesis Associated symptoms: headaches   Associated symptoms: no arthralgias, no chills and no myalgias   Illness Severity:  Moderate Onset quality:  Gradual Duration:  1 week Timing:  Constant Progression:  Worsening Chronicity:  New Associated symptoms: congestion, cough, fever, headaches, nausea, rhinorrhea and vomiting   Associated symptoms: no chest pain, no myalgias, no shortness of breath and no wheezing    82 yo F With a chief complaint of cough congestion and rhinorrhea and weakness. Patient also had a fever last night of 100.9 and then was febrile this morning to 101. Patient is on immune modulation for her renal cell carcinoma. Last infusion was done about 2 weeks ago. Patient has never been neutropenic. Was especially weak this morning and had trouble getting up and doing her normal daily activities. Denies any dysuria or flank pain. Denies abdominal pain. Denies shortness of breath or chest pain.   Past Medical History  Diagnosis Date  . Hyperlipidemia   . Osteoarthritis   . Osteoporosis/osteopenia increased risk   . Glaucoma   . Aortic stenosis     myoview neg ischemia 11/2006  ---- echo 11/10: mean AVA gradient~47    ---- s/p bioprosthetic AVR 12/2009  . Polymyalgia rheumatica ([place]) 11/05/2010  . PVD (peripheral vascular disease) (HCC)     left leg swells  . Hypertension     pt. states does not have HTN, no meds  . Renal cell carcinoma ([place]) 07/2016    with liver mets, liver biopsy confirmed urothelial origin.    Past Surgical History  Procedure Laterality Date  . Appendectomy    . Tonsillectomy    . Cholecystectomy  1958  . Cardiac valve replacement  May 2011    aortic valve  Porcine  . Carpal tunnel release  12/2010    left hand  . Cataract/glaucoma surgery  11/2010    mccuen and bond  . Breast surgery  1985    bx  . Abdominal hysterectomy      for fibroids  . Mastectomy      was only for removal of cysts, benign  . Eye surgery  01/2011    for glaucoma  . Carpal tunnel release  08/29/2011    Procedure: CARPAL TUNNEL RELEASE;  Surgeon: Cammie Sickle., MD;  Location: [place];  Service: Orthopedics;  Laterality: Right;  . Esophagogastroduodenoscopy (egd) with propofol N/A 08/28/2015    Procedure: ESOPHAGOGASTRODUODENOSCOPY (EGD) WITH PROPOFOL;  Surgeon: Ladene Artist, MD;  [facility];  Service: Endoscopy;  Laterality: N/A;  . Esophagogastroduodenoscopy N/A 08/31/2015    Procedure: ESOPHAGOGASTRODUODENOSCOPY (EGD), with stent;  Surgeon: Jerene Bears, MD;  [facility];  Service: Gastroenterology;  Laterality: N/A;   Family History  Problem  Relation Age of Onset  . Deep vein thrombosis Mother   . Heart attack Father   . Stroke Father   . Osteoporosis      aunt   Social History  Substance Use Topics  . Smoking status: Former Smoker -- 0.50 packs/day    Types: Cigarettes    Quit date: 08/18/1978  . Smokeless tobacco: None  . Alcohol Use: No   OB History    No data available     Review of Systems  Constitutional: Positive for fever. Negative for chills.  HENT: Positive for congestion and rhinorrhea.   Eyes: Negative for redness and visual  disturbance.  Respiratory: Positive for cough. Negative for shortness of breath and wheezing.   Cardiovascular: Negative for chest pain and palpitations.  Gastrointestinal: Positive for nausea and vomiting.  Genitourinary: Negative for dysuria and urgency.  Musculoskeletal: Negative for myalgias and arthralgias.  Skin: Negative for pallor and wound.  Neurological: Positive for headaches. Negative for dizziness.      Allergies  Tramadol  Home Medications   Prior to Admission medications   Medication Sig Start Date End Date Taking? Authorizing Provider  ALPRAZolam (XANAX) 0.25 MG tablet Take 1 tablet (0.25 mg total) by mouth at bedtime as needed for anxiety. 11/07/15  Yes Owens Shark, NP  aspirin 81 MG tablet Take 81 mg by mouth daily.     Yes Historical Provider, MD  Calcium Citrate-Vitamin D 250-200 MG-UNIT TABS Take 2 tablets by mouth daily.    Yes Historical Provider, MD  dextromethorphan-guaiFENesin (MUCINEX DM) 30-600 MG 12hr tablet Take 1 tablet by mouth every 4 (four) hours as needed for cough.   Yes Historical Provider, MD  Diphenhydramine-APAP, sleep, (TYLENOL PM EXTRA STRENGTH PO) Take 1 tablet by mouth at bedtime as needed (pain/sleep).    Yes Historical Provider, MD  dorzolamide-timolol (COSOPT) 22.3-6.8 MG/ML ophthalmic solution Place 1 drop into the left eye 2 (two) times daily.    Yes Historical Provider, MD  dronabinol (MARINOL) 2.5 MG capsule Take 1 capsule (2.5 mg total) by mouth 2 (two) times daily before lunch and supper. 10/17/15  Yes Owens Shark, NP  ezetimibe (ZETIA) 10 MG tablet Take 1 tablet (10 mg total) by mouth daily. 05/12/14  Yes Jolaine Artist, MD  furosemide (LASIX) 20 MG tablet Take 1 tablet (20 mg total) by mouth daily. Patient taking differently: Take 20-40 mg by mouth daily.  05/15/15  Yes Larey Dresser, MD  Multiple Vitamins-Minerals (CENTRUM SILVER ULTRA WOMENS PO) Take 1 tablet by mouth daily.     Yes Historical Provider, MD  Omega-3 Fatty  Acids (FISH OIL) 1000 MG CAPS Take 1 capsule by mouth daily. Reported on 08/06/2015   Yes Historical Provider, MD  ondansetron (ZOFRAN) 4 MG tablet Take 1 tablet (4 mg total) by mouth every 6 (six) hours as needed for nausea. 10/03/15  Yes Ladell Pier, MD  pantoprazole (PROTONIX) 40 MG tablet Take 1 tablet (40 mg total) by mouth daily. 10/24/15  Yes Owens Shark, NP   BP 105/65 mmHg  Pulse 86  Temp(Src) 99.1 F (37.3 C) (Oral)  Resp 20  SpO2 96% Physical Exam  Constitutional: She is oriented to person, place, and time. She appears well-developed and well-nourished. No distress.  HENT:  Head: Normocephalic and atraumatic.  Swollen turbinates, posterior nasal drip, no noted sinus ttp, tm normal bilaterally.    Eyes: EOM are normal. Pupils are equal, round, and reactive to light.  Neck: Normal range of  motion. Neck supple.  Cardiovascular: Normal rate and regular rhythm.  Exam reveals no gallop and no friction rub.   No murmur heard. Pulmonary/Chest: Effort normal. She has no wheezes. She has no rales.  Diminished breath sounds in the right lower lung field  Abdominal: Soft. She exhibits no distension. There is no tenderness. There is no rebound and no guarding.  Musculoskeletal: She exhibits no edema or tenderness.  Neurological: She is alert and oriented to person, place, and time.  Skin: Skin is warm and dry. She is not diaphoretic.  Psychiatric: She has a normal mood and affect. Her behavior is normal.  Nursing note and vitals reviewed.   ED Course  Procedures (including critical care time) Labs Review Labs Reviewed  CBC WITH DIFFERENTIAL/PLATELET - Abnormal; Notable for the following:    RBC 3.71 (*)    Hemoglobin 10.7 (*)    HCT 31.1 (*)    RDW 15.7 (*)    All other components within normal limits  COMPREHENSIVE METABOLIC PANEL - Abnormal; Notable for the following:    Sodium 129 (*)    Chloride 93 (*)    Glucose, Bld 113 (*)    BUN 30 (*)    Creatinine, Ser 1.17 (*)     AST 65 (*)    Alkaline Phosphatase 242 (*)    GFR calc non Af Amer 42 (*)    GFR calc Af Amer 48 (*)    All other components within normal limits  URINALYSIS, ROUTINE W REFLEX MICROSCOPIC (NOT AT [facility]) - Abnormal; Notable for the following:    Hgb urine dipstick SMALL (*)    All other components within normal limits  CULTURE, BLOOD (ROUTINE X 2)  CULTURE, BLOOD (ROUTINE X 2)  URINE MICROSCOPIC-ADD ON  INFLUENZA PANEL BY PCR (TYPE A & B, H1N1)  I-STAT CG4 LACTIC ACID, ED    Imaging Review Dg Chest 2 View  11/10/2015  CLINICAL DATA:  Patient states she has had a dry cough x 1 week. Hx of bronchitis, heart valve replacement, previous smoker, PVD, aortic stenosis, hypertension. EXAM: CHEST  2 VIEW COMPARISON:  10/10/2015 FINDINGS: Sternotomy wires overlie normal cardiac silhouette. Prosthetic valve noted. No effusion, infiltrate pneumothorax. Lungs are hyperinflated. IMPRESSION: Hyperinflated lungs.  No acute findings. Electronically Signed   By: Suzy Bouchard M.D.   On: 11/10/2015 11:06   I have personally reviewed and evaluated these images and lab results as part of my medical decision-making.   EKG Interpretation None      MDM   Final diagnoses:  Severe sepsis [facility])    82 yo F with a chief complaint of fever and weakness. Symptoms sound flulike will obtain a chest x-ray urine given a liter bolus Zofran oral trial and reassess. RLL fields diminished, suspect pna.   Patient became hypotensive while in the ED.  Given another bolus, code sepsis initiated.  Given broad spectrum abx. Patient on chemo for RCC. Admit.   CRITICAL CARE Performed by: Cecilio Asper   Total critical care time: 35 minutes  Critical care time was exclusive of separately billable procedures and treating other patients.  Critical care was necessary to treat or prevent imminent or life-threatening deterioration.  Critical care was time spent personally by me on the following activities:  development of treatment plan with patient and/or surrogate as well as nursing, discussions with consultants, evaluation of patient's response to treatment, examination of patient, obtaining history from patient or surrogate, ordering and performing treatments and interventions, ordering and  review of laboratory studies, ordering and review of radiographic studies, pulse oximetry and re-evaluation of patient's condition.  The patients results and plan were reviewed and discussed.   Any x-rays performed were independently reviewed by myself.   Differential diagnosis were considered with the presenting HPI.  Medications  sodium chloride 0.9 % bolus 1,000 mL (0 mLs Intravenous Stopped 11/10/15 1148)  acetaminophen (TYLENOL) tablet 1,000 mg (1,000 mg Oral Given 11/10/15 1037)  ondansetron (ZOFRAN) injection 4 mg (4 mg Intravenous Given 11/10/15 1037)  sodium chloride 0.9 % bolus 1,000 mL (0 mLs Intravenous Stopped 11/10/15 1446)  vancomycin (VANCOCIN) IVPB 1000 mg/200 mL premix (0 mg Intravenous Stopped 11/10/15 1446)  piperacillin-tazobactam (ZOSYN) IVPB 3.375 g (0 g Intravenous Stopped 11/10/15 1343)    Filed Vitals:   11/10/15 1245 11/10/15 1300 11/10/15 1341 11/10/15 1400  BP: 85/51 82/51 96/56  105/65  Pulse: 98 98 87 86  Temp:      TempSrc:      Resp: 24 22 16 20   SpO2: 96% 96% 95% 96%    Final diagnoses:  Severe sepsis ([place])    Admission/ observation were discussed with the admitting physician, patient and/or family and they are comfortable with the plan.    Deno Etienne, DO 11/10/15 1458

## 2015-11-10 NOTE — Progress Notes (Signed)
Pharmacy Antibiotic Note  Kim Donovan is a 82 y.o. female admitted on 11/10/2015 with sepsis.  Pharmacy has been consulted for vancomycin and zosyn dosing.  Plan:  Vancomycin 1gm x 1 in ED then 1gm IV q24h  Check trough if remains on vancomycin for > 4 days  Monitor renal function  Zosyn 3.375gm IV over 82min x 1 in ED then q8h over 4h infusion  Temp (24hrs), Avg:98.9 F (37.2 C), Min:98.7 F (37.1 C), Max:99.1 F (37.3 C)   Recent Labs Lab 11/07/15 0952 11/10/15 1030 11/10/15 1337  WBC 5.5 6.9  --   CREATININE 1.3* 1.17*  --   LATICACIDVEN  --   --  0.65    Estimated Creatinine Clearance: 33.5 mL/min (by C-G formula based on Cr of 1.17).    Allergies  Allergen Reactions  . Tramadol Rash    After surgery pt  broke out into a rash.Pt stop taking.    Antimicrobials this admission: Vancomycin 3/25 >> Pip/tazo 3/25 >>  Dose adjustments this admission:  Microbiology results: 3/25 flu PCR:  3/25 blood:   Thank you for allowing pharmacy to be a part of this patient's care.  Doreene Eland, PharmD, BCPS.   Pager: [phone] 11/10/2015 4:20 PM

## 2015-11-11 ENCOUNTER — Other Ambulatory Visit: Payer: Self-pay | Admitting: Oncology

## 2015-11-11 DIAGNOSIS — R0602 Shortness of breath: Secondary | ICD-10-CM | POA: Insufficient documentation

## 2015-11-11 DIAGNOSIS — R6889 Other general symptoms and signs: Secondary | ICD-10-CM | POA: Insufficient documentation

## 2015-11-11 MED ORDER — PREDNISONE 5 MG PO TABS
10.0000 mg | ORAL_TABLET | Freq: Every day | ORAL | Status: DC
  Administered 2015-11-12 – 2015-11-13 (×2): 10 mg via ORAL
  Filled 2015-11-11 (×2): qty 2

## 2015-11-11 MED ORDER — HYDROCOD POLST-CPM POLST ER 10-8 MG/5ML PO SUER
5.0000 mL | Freq: Two times a day (BID) | ORAL | Status: DC | PRN
  Administered 2015-11-11 – 2015-11-12 (×3): 5 mL via ORAL
  Filled 2015-11-11 (×3): qty 5

## 2015-11-11 MED ORDER — LORATADINE 10 MG PO TABS
10.0000 mg | ORAL_TABLET | Freq: Every day | ORAL | Status: DC
  Administered 2015-11-11 – 2015-11-14 (×4): 10 mg via ORAL
  Filled 2015-11-11 (×4): qty 1

## 2015-11-11 MED ORDER — BUDESONIDE 0.25 MG/2ML IN SUSP
0.2500 mg | Freq: Two times a day (BID) | RESPIRATORY_TRACT | Status: DC
  Administered 2015-11-12 – 2015-11-14 (×4): 0.25 mg via RESPIRATORY_TRACT
  Filled 2015-11-11 (×5): qty 2

## 2015-11-11 NOTE — Progress Notes (Signed)
[facility] PROGRESS NOTE  Kim Donovan [id number]:[id number] DOB: [birth year] DOA: 11/10/2015 PCP: Mathews Argyle, MD  Assessment/Plan: Possible Severe sepsis ([place]) versus influenza-like illness with hypotension/Chronic immunosuppression -Sepsis order set utilized in ED.  -Met 2 or more SIRS criteria on admission (? Temp > 100.9 <96.8; HR >90; RR >20; PaCO2<32; WBC >12K<4K or >10 %bands AND has evidence of acute organ failure (lactic acidosis, oliguria, ALI, ARDS, coagulopathy/DIC, AMS, hypotension/shock). -patient sepsis features resolving and currently no source of infection identified  -will repeat CXR in am after IVF's given -influenza panel neg; will d/c tamiflu  -will continue current abx's for now and follow culture data -will add pulmicort, loratadine and flutter valve   Stage III chronic kidney disease - Creatinine consistent with usual baseline values. -will follow trend and minimize nephrotoxic agents    Fatigue/history of PMR -Has history of polymyalgia rheumatica which may be contributory.  -ESR elevated -Also reports shoulder pain, which is similar to when she was diagnosed with PMR in 2012. -will re-start low dose prednisone and follow response -PT therapy for evaluation    Hyponatremia - Has been a chronic issue.  -will continue hydration and follow electrolytes trend.   Metastatic urothelial carcinoma ([name]) with hepatic metastasis - Completed cycle 2 Tecentriq 10/24/2015. Third cycle planned 11/14/15. - Dr. Benay Spice notified of the patient's admission.  Code Status: DNR Family Communication: no family at bedside  Disposition Plan: will transfer out of stepdown, continue supportive care, start pulmicort and tussionex. Continue IV antibiotics for another 24 hours, follow culture data and repeat CXR.    Consultants:  None   Procedures:  See below for x-ray reports   Antibiotics:  Vanc 3/25  Zosyn 3/25  HPI/Subjective: Low grade fever, no  CP; BP improved and endorses ongoing cough and nasal congestion. Patient reports she is tired and not feeling good.  Objective: Filed Vitals:   11/11/15 0400 11/11/15 0800  BP: 150/68   Pulse: 86   Temp: 99.5 F (37.5 C) 100.2 F (37.9 C)  Resp: 19     Intake/Output Summary (Last 24 hours) at 11/11/15 0905 Last data filed at 11/11/15 0500  Gross per 24 hour  Intake   1205 ml  Output    601 ml  Net    604 ml   There were no vitals filed for this visit.  Exam:   General:  Low grade fever, no CP, good O2 sat on RA. Patient reports not feeling good and endorses ongoing cough and nasal congestion.  Cardiovascular: S1 and S2, soft SEM, no rubs, no gallops   Respiratory: positive diffuse rhonchi, mild exp wheezing, no crackles  Abdomen: soft, NT, ND, positive BS  Musculoskeletal: no edema, no cyanosis    Data Reviewed: Basic Metabolic Panel:  Recent Labs Lab 11/07/15 0952 11/10/15 1030 11/10/15 1638  NA 134* 129*  --   K 4.8 4.6  --   CL  --  93*  --   CO2 29 24  --   GLUCOSE 96 113*  --   BUN 35.7* 30*  --   CREATININE 1.3* 1.17* 1.21*  CALCIUM 10.3 9.3  --    Liver Function Tests:  Recent Labs Lab 11/07/15 0952 11/10/15 1030  AST 55* 65*  ALT 39 40  ALKPHOS 242* 242*  BILITOT 0.41 0.8  PROT 8.0 8.1  ALBUMIN 3.3* 3.7   CBC:  Recent Labs Lab 11/07/15 0952 11/10/15 1030 11/10/15 1638  WBC 5.5 6.9 6.9  NEUTROABS 3.2 5.0  --  HGB 11.1* 10.7* 9.6*  HCT 33.8* 31.1* 28.3*  MCV 88.5 83.8 85.0  PLT 255 259 214   BNP (last 3 results)  Recent Labs  05/15/15 1115 06/15/15 0955  BNP 111.4* 100.1*    Recent Results (from the past 240 hour(s))  MRSA PCR Screening     Status: None   Collection Time: 11/10/15  3:26 PM  Result Value Ref Range Status   MRSA by PCR NEGATIVE NEGATIVE Final    Comment:        The GeneXpert MRSA Assay (FDA approved for NASAL specimens only), is one component of a comprehensive MRSA colonization surveillance  program. It is not intended to diagnose MRSA infection nor to guide or monitor treatment for MRSA infections.      Studies: Dg Chest 2 View  11/10/2015  CLINICAL DATA:  Patient states she has had a dry cough x 1 week. Hx of bronchitis, heart valve replacement, previous smoker, PVD, aortic stenosis, hypertension. EXAM: CHEST  2 VIEW COMPARISON:  10/10/2015 FINDINGS: Sternotomy wires overlie normal cardiac silhouette. Prosthetic valve noted. No effusion, infiltrate pneumothorax. Lungs are hyperinflated. IMPRESSION: Hyperinflated lungs.  No acute findings. Electronically Signed   By: Suzy Bouchard M.D.   On: 11/10/2015 11:06    Scheduled Meds: . aspirin EC  81 mg Oral Daily  . budesonide (PULMICORT) nebulizer solution  0.25 mg Nebulization BID  . calcium-vitamin D  2 tablet Oral Q breakfast  . dorzolamide-timolol  1 drop Left Eye BID  . dronabinol  2.5 mg Oral BID AC  . enoxaparin (LOVENOX) injection  40 mg Subcutaneous Q24H  . ezetimibe  10 mg Oral Daily  . loratadine  10 mg Oral Daily  . multivitamin with minerals  1 tablet Oral Daily  . omega-3 acid ethyl esters  1 g Oral Daily  . pantoprazole  40 mg Oral Daily  . piperacillin-tazobactam (ZOSYN)  IV  3.375 g Intravenous 3 times per day  . sodium chloride flush  3 mL Intravenous Q12H  . vancomycin  1,000 mg Intravenous Q24H   Continuous Infusions: . sodium chloride 100 mL/hr at 11/10/15 1727    Principal Problem:   Severe sepsis ([place]) Active Problems:   Fatigue   Hyponatremia   Hepatic metastases ([place])   Metastatic urothelial carcinoma ([place])    Time spent: 40 minutes (> 50% of the time dedicated to face to face evaluation, case discussion/updates and care coordination)    Barton Dubois  [facility] Pager [phone]. If 7PM-7AM, please contact night-coverage at [email or web address], password [facility] 11/11/2015, 9:05 AM  LOS: 1 day

## 2015-11-11 NOTE — Progress Notes (Signed)
Report called and given to [place], rn on [place].

## 2015-11-12 ENCOUNTER — Inpatient Hospital Stay (HOSPITAL_COMMUNITY): Payer: Medicare Other

## 2015-11-12 LAB — CBC
HEMATOCRIT: 27.7 % — AB (ref 36.0–46.0)
HEMOGLOBIN: 9.3 g/dL — AB (ref 12.0–15.0)
MCH: 29.2 pg (ref 26.0–34.0)
MCHC: 33.6 g/dL (ref 30.0–36.0)
MCV: 86.8 fL (ref 78.0–100.0)
Platelets: 191 10*3/uL (ref 150–400)
RBC: 3.19 MIL/uL — AB (ref 3.87–5.11)
RDW: 16.1 % — ABNORMAL HIGH (ref 11.5–15.5)
WBC: 6.9 10*3/uL (ref 4.0–10.5)

## 2015-11-12 LAB — BASIC METABOLIC PANEL
Anion gap: 8 (ref 5–15)
BUN: 25 mg/dL — AB (ref 6–20)
CHLORIDE: 96 mmol/L — AB (ref 101–111)
CO2: 24 mmol/L (ref 22–32)
Calcium: 8.2 mg/dL — ABNORMAL LOW (ref 8.9–10.3)
Creatinine, Ser: 1.06 mg/dL — ABNORMAL HIGH (ref 0.44–1.00)
GFR calc Af Amer: 54 mL/min — ABNORMAL LOW (ref >60)
GFR calc non Af Amer: 47 mL/min — ABNORMAL LOW (ref >60)
GLUCOSE: 88 mg/dL (ref 65–99)
POTASSIUM: 3.7 mmol/L (ref 3.5–5.1)
Sodium: 128 mmol/L — ABNORMAL LOW (ref 135–145)

## 2015-11-12 MED ORDER — DEXTROMETHORPHAN POLISTIREX ER 30 MG/5ML PO SUER
30.0000 mg | Freq: Two times a day (BID) | ORAL | Status: DC | PRN
  Administered 2015-11-13: 30 mg via ORAL
  Filled 2015-11-12 (×3): qty 5

## 2015-11-12 MED ORDER — ALBUTEROL SULFATE (2.5 MG/3ML) 0.083% IN NEBU: 2.5000 mg | INHALATION_SOLUTION | RESPIRATORY_TRACT | Status: DC | PRN

## 2015-11-12 NOTE — Clinical Documentation Improvement (Signed)
Hospitalist  Can the diagnosis of Shock be further specified?   Shock, including Type:  Septic, Cardiogenic, Hyper/Hypoglycemic, Hypovolemic, Hemorrhagic, Neurogenic, Anaphylactic, Other type, including suspected or known cause and/or associated condition(s)  Other  Clinically Undetermined  Document any associated diagnoses/conditions.   Supporting Information: Hypotension/shock noted per 3/26 progress notes.  Please exercise your independent, professional judgment when responding. A specific answer is not anticipated or expected.   Thank You,  [place] [phone]

## 2015-11-12 NOTE — Evaluation (Signed)
Physical Therapy Evaluation Patient Details Name: Kim Donovan MRN: [id number] DOB: Apr 20, 1929 Today's Date: 11/12/2015   History of Present Illness  Kim Donovan is a 82 y.o. female with a history of HTN. PMR, PVD, Renal cell CA with liver mets and Glaucoma, with recent biopsy of a Large Right Renal Mass and a Hepatic lesion on 08/10/2015 who presents to the ED with complaints of Nausea and Vomiting and Fever   Clinical Impression  Pt admitted as above and presenting with functional mobility limitations 2* generalized weakness and ambulatory balance deficits.  Pt should progress to dc home with family assist.    Follow Up Recommendations No PT follow up    Equipment Recommendations  None recommended by PT    Recommendations for Other Services       Precautions / Restrictions Precautions Precautions: Fall Precaution Comments: Has fallen at home Restrictions Weight Bearing Restrictions: No      Mobility  Bed Mobility Overal bed mobility: Modified Independent             General bed mobility comments: Increased time but unassisted  Transfers Overall transfer level: Needs assistance Equipment used: Rolling walker (2 wheeled) Transfers: Sit to/from Stand Sit to Stand: Min guard         General transfer comment: min guard to steady with initial standing  Ambulation/Gait Ambulation/Gait assistance: Min assist;Min guard Ambulation Distance (Feet): 500 Feet Assistive device: Rolling walker (2 wheeled) Gait Pattern/deviations: Step-through pattern;Shuffle;Wide base of support     General Gait Details: Initial mild instability with pt reaching for furniture to steady.  Instability largely corrected with use of RW.  Pt states "I have not been up for several days so I am unsteady"  Stairs            Wheelchair Mobility    Modified Rankin (Stroke Patients Only)       Balance Overall balance assessment: Needs assistance Sitting-balance support: No upper  extremity supported;Feet supported Sitting balance-Leahy Scale: Good     Standing balance support: No upper extremity supported Standing balance-Leahy Scale: Fair                               Pertinent Vitals/Pain Pain Assessment: No/denies pain    Home Living Family/patient expects to be discharged to:: Private residence Living Arrangements: Spouse/significant other Available Help at Discharge: Family Type of Home: Apartment Home Access: Level entry;Elevator     Home Layout: One level Home Equipment: None      Prior Function Level of Independence: Independent               Hand Dominance        Extremity/Trunk Assessment   Upper Extremity Assessment: Generalized weakness           Lower Extremity Assessment: Generalized weakness         Communication   Communication: No difficulties;HOH  Cognition Arousal/Alertness: Awake/alert Behavior During Therapy: WFL for tasks assessed/performed Overall Cognitive Status: Within Functional Limits for tasks assessed                      General Comments      Exercises        Assessment/Plan    PT Assessment Patient needs continued PT services  PT Diagnosis Difficulty walking   PT Problem List Decreased strength;Decreased activity tolerance;Decreased balance;Decreased mobility;Decreased knowledge of use of DME  PT Treatment Interventions DME instruction;Gait  training;Functional mobility training;Therapeutic activities;Therapeutic exercise;Patient/family education   PT Goals (Current goals can be found in the Care Plan section) Acute Rehab PT Goals Patient Stated Goal: Regain IND and return home with spouse PT Goal Formulation: With patient Time For Goal Achievement: 11/24/15 Potential to Achieve Goals: Good    Frequency 7X/week   Barriers to discharge        Co-evaluation               End of Session Equipment Utilized During Treatment: Gait belt Activity  Tolerance: Patient tolerated treatment well Patient left: in chair;with call bell/phone within reach;with family/visitor present Nurse Communication: Mobility status         Time: [id number] PT Time Calculation (min) (ACUTE ONLY): 23 min   Charges:   PT Evaluation $PT Eval Low Complexity: 1 Procedure PT Treatments $Gait Training: 8-22 mins   PT G Codes:        Thandiwe Siragusa 20-Nov-2015, 12:21 PM

## 2015-11-12 NOTE — Progress Notes (Signed)
[facility] PROGRESS NOTE  SARAE NICHOLES [id number]:[id number] DOB: 08/04/1929 DOA: 11/10/2015 PCP: Mathews Argyle, MD  Assessment/Plan: Sepsis ([place]) due to HCAP: patient with hx of Chronic immunosuppression -Sepsis order set utilized in ED.  -Met 2 or more SIRS criteria on admission (? Temp > 100.9 ; HR >90; RR >20 and hypotension) -patient sepsis features resolving  -will repeated CXR on 3/27 demonstrating left subsegmental infiltrates -influenza panel neg; will d/c tamiflu  -will continue current abx's for another 24 hours and then transition to PO regimen (will aim to treat for a total of 8 days). Follow culture data -will continue pulmicort, loratadine, low dose prednisone and flutter valve   Stage III chronic kidney disease -Creatinine consistent with usual baseline values after IVF's given -will follow trend and minimize nephrotoxic agents    Fatigue/history of PMR -Has history of polymyalgia rheumatica which may be contributory.  -ESR elevated -Also reports shoulder pain, which is similar to when she was diagnosed with PMR in 2012. (slightly better today) -will continue low dose prednisone daily for now -PT therapy evaluation, found patient at baseline and stable to return home with prior care arrangements    Hyponatremia -Has been a chronic issue.  -will continue hydration and follow electrolytes trend. -has remained stable.   Metastatic urothelial carcinoma ([place]) with hepatic metastasis - Completed cycle 2 Tecentriq 10/24/2015. Third cycle planned 11/14/15. - Dr. Benay Spice notified of the patient's admission. -will anticipate treatment will be most likely postponed until acute illness overcome   Code Status: DNR Family Communication: no family at bedside  Disposition Plan: will transfer out of stepdown, continue supportive care, start pulmicort and tussionex. Continue IV antibiotics for another 24 hours (given findings of PNA on CXR, looking to passed 24 hours  w/o fever before transition to PO); follow cx data.    Consultants:  Oncology service informed of admission   Procedures:  See below for x-ray reports: most recent CXR on 3/27 demonstrating left subsegmental infiltrate   Antibiotics:  Vanc 3/25  Zosyn 3/25  HPI/Subjective: Feeling better overall. Still with some SOB on activity and complaining of coughing spells   Objective: Filed Vitals:   11/12/15 0503 11/12/15 1520  BP: 135/66 126/72  Pulse: 82 85  Temp: 97.7 F (36.5 C) 97.9 F (36.6 C)  Resp: 20 20    Intake/Output Summary (Last 24 hours) at 11/12/15 1932 Last data filed at 11/12/15 1100  Gross per 24 hour  Intake   3500 ml  Output    200 ml  Net   3300 ml   Filed Weights   11/11/15 1500  Weight: 67.495 kg (148 lb 12.8 oz)    Exam:   General:  Patient afebrile now for 24 hours; no CP and reports breathing is better. She endorses some coughing spells (non productive) as main complaint. Good O2 sat on RA.   Cardiovascular: S1 and S2, soft SEM, no rubs, no gallops   Respiratory: positive diffuse rhonchi, no frank wheezing, no crackles and overall improved air movement   Abdomen: soft, NT, ND, positive BS  Musculoskeletal: no edema, no cyanosis    Data Reviewed: Basic Metabolic Panel:  Recent Labs Lab 11/07/15 0952 11/10/15 1030 11/10/15 1638 11/12/15 0358  NA 134* 129*  --  128*  K 4.8 4.6  --  3.7  CL  --  93*  --  96*  CO2 29 24  --  24  GLUCOSE 96 113*  --  88  BUN 35.7* 30*  --  25*  CREATININE 1.3* 1.17* 1.21* 1.06*  CALCIUM 10.3 9.3  --  8.2*   Liver Function Tests:  Recent Labs Lab 11/07/15 0952 11/10/15 1030  AST 55* 65*  ALT 39 40  ALKPHOS 242* 242*  BILITOT 0.41 0.8  PROT 8.0 8.1  ALBUMIN 3.3* 3.7   CBC:  Recent Labs Lab 11/07/15 0952 11/10/15 1030 11/10/15 1638 11/12/15 0358  WBC 5.5 6.9 6.9 6.9  NEUTROABS 3.2 5.0  --   --   HGB 11.1* 10.7* 9.6* 9.3*  HCT 33.8* 31.1* 28.3* 27.7*  MCV 88.5 83.8 85.0 86.8   PLT 255 259 214 191   BNP (last 3 results)  Recent Labs  05/15/15 1115 06/15/15 0955  BNP 111.4* 100.1*    Recent Results (from the past 240 hour(s))  Blood culture (routine x 2)     Status: None (Preliminary result)   Collection Time: 11/10/15 10:30 AM  Result Value Ref Range Status   Specimen Description BLOOD RIGHT FOREARM  Final   Special Requests   Final    BOTTLES DRAWN AEROBIC AND ANAEROBIC 5CC BOTH BOTTLES   Culture   Final    NO GROWTH 2 DAYS Performed at [facility]    Report Status PENDING  Incomplete  Blood culture (routine x 2)     Status: None (Preliminary result)   Collection Time: 11/10/15 10:43 AM  Result Value Ref Range Status   Specimen Description BLOOD LEFT ANTECUBITAL  Final   Special Requests BOTTLES DRAWN AEROBIC AND ANAEROBIC 5 [place]  Final   Culture   Final    NO GROWTH 2 DAYS Performed at [facility]    Report Status PENDING  Incomplete  MRSA PCR Screening     Status: None   Collection Time: 11/10/15  3:26 PM  Result Value Ref Range Status   MRSA by PCR NEGATIVE NEGATIVE Final    Comment:        The GeneXpert MRSA Assay (FDA approved for NASAL specimens only), is one component of a comprehensive MRSA colonization surveillance program. It is not intended to diagnose MRSA infection nor to guide or monitor treatment for MRSA infections.      Studies: Dg Chest 2 View  11/12/2015  CLINICAL DATA:  Renal cell carcinoma. EXAM: CHEST  2 VIEW COMPARISON:  11/10/2015 . FINDINGS: Prior cardiac valve replacement. Cardiomegaly with normal pulmonary vascularity. Mild left base subsegmental atelectasis and or infiltrate. No pleural effusion or pneumothorax. Thoracic spine scoliosis. IMPRESSION: 1. Prior cardiac valve replacement.  Cardiomegaly. 2. Mild left base subsegmental atelectasis and or infiltrate. Electronically Signed   By: Marcello Moores  Register   On: 11/12/2015 09:23    Scheduled Meds: . aspirin EC  81 mg Oral Daily  .  budesonide (PULMICORT) nebulizer solution  0.25 mg Nebulization BID  . calcium-vitamin D  2 tablet Oral Q breakfast  . dorzolamide-timolol  1 drop Left Eye BID  . dronabinol  2.5 mg Oral BID AC  . enoxaparin (LOVENOX) injection  40 mg Subcutaneous Q24H  . ezetimibe  10 mg Oral Daily  . loratadine  10 mg Oral Daily  . multivitamin with minerals  1 tablet Oral Daily  . omega-3 acid ethyl esters  1 g Oral Daily  . pantoprazole  40 mg Oral Daily  . piperacillin-tazobactam (ZOSYN)  IV  3.375 g Intravenous 3 times per day  . predniSONE  10 mg Oral Q breakfast  . sodium chloride flush  3 mL Intravenous Q12H  . vancomycin  1,000 mg Intravenous Q24H   Continuous Infusions: . sodium chloride 75 mL/hr at 11/11/15 1823    Principal Problem:   Severe sepsis ([facility]) Active Problems:   Fatigue   Hyponatremia   Hepatic metastases ([facility])   Metastatic urothelial carcinoma ([facility])   SOB (shortness of breath)   Influenza-like symptoms    Time spent: 35 minutes (> 50% of the time dedicated to face to face evaluation, case discussion/updates and care coordination)    Barton Dubois  [facility] Pager [phone]. If 7PM-7AM, please contact night-coverage at [email or web address], password [facility] 11/12/2015, 7:32 PM  LOS: 2 days

## 2015-11-13 DIAGNOSIS — R531 Weakness: Secondary | ICD-10-CM

## 2015-11-13 DIAGNOSIS — C641 Malignant neoplasm of right kidney, except renal pelvis: Secondary | ICD-10-CM

## 2015-11-13 DIAGNOSIS — A419 Sepsis, unspecified organism: Secondary | ICD-10-CM | POA: Insufficient documentation

## 2015-11-13 DIAGNOSIS — J069 Acute upper respiratory infection, unspecified: Secondary | ICD-10-CM

## 2015-11-13 DIAGNOSIS — J189 Pneumonia, unspecified organism: Secondary | ICD-10-CM | POA: Insufficient documentation

## 2015-11-13 DIAGNOSIS — R110 Nausea: Secondary | ICD-10-CM

## 2015-11-13 MED ORDER — AMOXICILLIN-POT CLAVULANATE 875-125 MG PO TABS
1.0000 | ORAL_TABLET | Freq: Two times a day (BID) | ORAL | Status: DC
  Administered 2015-11-13 – 2015-11-14 (×3): 1 via ORAL
  Filled 2015-11-13 (×3): qty 1

## 2015-11-13 NOTE — Progress Notes (Signed)
[facility] PROGRESS NOTE  Kim Donovan MWN:[id number] DOB: [birth year] DOA: 11/10/2015 PCP: Mathews Argyle, MD  Assessment/Plan: Sepsis ([place]) due to HCAP: patient with hx of Chronic immunosuppression -Sepsis order set utilized in ED.  -Met 2 or more SIRS criteria on admission (? Temp > 100.9 ; HR >90; RR >20 and hypotension) -patient sepsis features resolving  -will repeated CXR on 3/27 demonstrating left subsegmental infiltrates -influenza panel neg; will d/c tamiflu  -will transition to augmentin PO regimen (will aim to treat for a total of 8 days). Follow culture data -will continue pulmicort, loratadine and flutter valve -if stable will discharge home in am   Stage III chronic kidney disease -Creatinine consistent with usual baseline values after IVF's given -will follow trend and minimize nephrotoxic agents    Fatigue/history of PMR -Has history of polymyalgia rheumatica which may be contributory.  -ESR elevated; but mainly from acute infection -Also reports shoulder pain, which is similar to when she was diagnosed with PMR in 2012. (improved and better today) -PT therapy evaluation completed, found patient at baseline and stable to return home with prior care arrangements and use of a walker. -discussed with Dr. Learta Codding and he recommended not to use steroids given ongoing immunotherapy    Hyponatremia -Has been a chronic issue.  -will continue hydration and follow electrolytes trend. -has remained stable.   Metastatic urothelial carcinoma ([place]) with hepatic metastasis - Completed cycle 2 Tecentriq 10/24/2015. Third cycle planned 11/14/15. - Dr. Benay Spice notified of the patient's admission. -will anticipate treatment will be most likely postponed until acute illness overcome   Code Status: DNR Family Communication: no family at bedside  Disposition Plan: will transition to PO antibiotics today, follow tolerance and response; if stable home in am with [facility]  services;  follow cx data.   Consultants:  Oncology service informed of admission   Procedures:  See below for x-ray reports: most recent CXR on 3/27 demonstrating left subsegmental infiltrate   Antibiotics:  Vanc 3/25  Zosyn 3/25  HPI/Subjective: Feeling better overall. Still with some SOB on activity; but improved. No CP and with improvement in her coughing spells as well.   Objective: Filed Vitals:   11/13/15 0542 11/13/15 1415  BP: 111/59 133/74  Pulse: 66 76  Temp: 98.3 F (36.8 C) 97.8 F (36.6 C)  Resp: 18 16    Intake/Output Summary (Last 24 hours) at 11/13/15 1811 Last data filed at 11/13/15 1230  Gross per 24 hour  Intake 2197.08 ml  Output      0 ml  Net 2197.08 ml   Filed Weights   11/11/15 1500  Weight: 67.495 kg (148 lb 12.8 oz)    Exam:   General:  Patient afebrile now for 24 hours; no CP and reports breathing is much better. She endorses coughing is also improving (non productive) as main complaint. Good O2 sat on RA.   Cardiovascular: S1 and S2, soft SEM, no rubs, no gallops   Respiratory: positive diffuse rhonchi, no frank wheezing, no crackles and overall improved air movement.  Abdomen: soft, NT, ND, positive BS  Musculoskeletal: no edema, no cyanosis    Data Reviewed: Basic Metabolic Panel:  Recent Labs Lab 11/07/15 0952 11/10/15 1030 11/10/15 1638 11/12/15 0358  NA 134* 129*  --  128*  K 4.8 4.6  --  3.7  CL  --  93*  --  96*  CO2 29 24  --  24  GLUCOSE 96 113*  --  88  BUN 35.7*  30*  --  25*  CREATININE 1.3* 1.17* 1.21* 1.06*  CALCIUM 10.3 9.3  --  8.2*   Liver Function Tests:  Recent Labs Lab 11/07/15 0952 11/10/15 1030  AST 55* 65*  ALT 39 40  ALKPHOS 242* 242*  BILITOT 0.41 0.8  PROT 8.0 8.1  ALBUMIN 3.3* 3.7   CBC:  Recent Labs Lab 11/07/15 0952 11/10/15 1030 11/10/15 1638 11/12/15 0358  WBC 5.5 6.9 6.9 6.9  NEUTROABS 3.2 5.0  --   --   HGB 11.1* 10.7* 9.6* 9.3*  HCT 33.8* 31.1* 28.3* 27.7*   MCV 88.5 83.8 85.0 86.8  PLT 255 259 214 191   BNP (last 3 results)  Recent Labs  05/15/15 1115 06/15/15 0955  BNP 111.4* 100.1*    Recent Results (from the past 240 hour(s))  Blood culture (routine x 2)     Status: None (Preliminary result)   Collection Time: 11/10/15 10:30 AM  Result Value Ref Range Status   Specimen Description BLOOD RIGHT FOREARM  Final   Special Requests   Final    BOTTLES DRAWN AEROBIC AND ANAEROBIC 5CC BOTH BOTTLES   Culture   Final    NO GROWTH 3 DAYS Performed at [facility]    Report Status PENDING  Incomplete  Blood culture (routine x 2)     Status: None (Preliminary result)   Collection Time: 11/10/15 10:43 AM  Result Value Ref Range Status   Specimen Description BLOOD LEFT ANTECUBITAL  Final   Special Requests BOTTLES DRAWN AEROBIC AND ANAEROBIC 5 [place]  Final   Culture   Final    NO GROWTH 3 DAYS Performed at [facility]    Report Status PENDING  Incomplete  MRSA PCR Screening     Status: None   Collection Time: 11/10/15  3:26 PM  Result Value Ref Range Status   MRSA by PCR NEGATIVE NEGATIVE Final    Comment:        The GeneXpert MRSA Assay (FDA approved for NASAL specimens only), is one component of a comprehensive MRSA colonization surveillance program. It is not intended to diagnose MRSA infection nor to guide or monitor treatment for MRSA infections.      Studies: Dg Chest 2 View  11/12/2015  CLINICAL DATA:  Renal cell carcinoma. EXAM: CHEST  2 VIEW COMPARISON:  11/10/2015 . FINDINGS: Prior cardiac valve replacement. Cardiomegaly with normal pulmonary vascularity. Mild left base subsegmental atelectasis and or infiltrate. No pleural effusion or pneumothorax. Thoracic spine scoliosis. IMPRESSION: 1. Prior cardiac valve replacement.  Cardiomegaly. 2. Mild left base subsegmental atelectasis and or infiltrate. Electronically Signed   By: Marcello Moores  Register   On: 11/12/2015 09:23    Scheduled Meds: .  amoxicillin-clavulanate  1 tablet Oral Q12H  . aspirin EC  81 mg Oral Daily  . budesonide (PULMICORT) nebulizer solution  0.25 mg Nebulization BID  . calcium-vitamin D  2 tablet Oral Q breakfast  . dorzolamide-timolol  1 drop Left Eye BID  . dronabinol  2.5 mg Oral BID AC  . enoxaparin (LOVENOX) injection  40 mg Subcutaneous Q24H  . ezetimibe  10 mg Oral Daily  . loratadine  10 mg Oral Daily  . multivitamin with minerals  1 tablet Oral Daily  . omega-3 acid ethyl esters  1 g Oral Daily  . pantoprazole  40 mg Oral Daily  . sodium chloride flush  3 mL Intravenous Q12H   Continuous Infusions: . sodium chloride 50 mL/hr at 11/13/15 0503  Principal Problem:   Severe sepsis (H[facility]) Active Problems:   Fatigue   Hyponatremia   Hepatic metastases (HCC)   Metastatic urothelial carcinoma (H[facility])   SOB (shortness of breath)   Influenza-like symptoms    Time spent: 35 minutes (> 50% of the time dedicated to face to face evaluation, case discussion/updates and care coordination)    Barton Dubois  [facility] Pager [phone]. If 7PM-7AM, please contact night-coverage at [email or web address], password [facility] 11/13/2015, 6:11 PM  LOS: 3 days

## 2015-11-13 NOTE — Care Management Important Message (Signed)
Important Message  Patient Details  Name: Kim Donovan MRN: [id number] Date of Birth: [birth year]   Medicare Important Message Given:  Yes    [name] 11/13/2015, 11:06 AMImportant Message  Patient Details  Name: Kim Donovan MRN: [id number] Date of Birth: Nov 05, 1928   Medicare Important Message Given:  Yes    [name] 11/13/2015, 11:06 AM

## 2015-11-13 NOTE — Progress Notes (Addendum)
IP PROGRESS NOTE  Subjective:   Kim Donovan was admitted 11/10/2015 with fever and hypotension. She reports a cough. She continues to feel "weak ". Mild nausea. Leg edema has improved. She is being treated for presumed pneumonia.  Objective: Vital signs in last 24 hours: Blood pressure 111/59, pulse 66, temperature 98.3 F (36.8 C), temperature source Oral, resp. rate 18, height 5' 6.5" (1.689 m), weight 148 lb 12.8 oz (67.495 kg), SpO2 99 %.  Intake/Output from previous day: 03/27 0701 - 03/28 0700 In: 1957.1 [P.O.:240; I.V.:1517.1; IV Piggyback:200] Out: -   Physical Exam:  HEENT: [place] over the tongue, no buccal thrush Lungs: Scattered inspiratory and expiratory coarse rhonchi, no respiratory distress Cardiac: Regular rate and rhythm Abdomen: No mass, no hepatomegaly, nontender Extremities: Trace low leg edema bilaterally     Lab Results:  Recent Labs  11/10/15 1638 11/12/15 0358  WBC 6.9 6.9  HGB 9.6* 9.3*  HCT 28.3* 27.7*  PLT 214 191    BMET  Recent Labs  11/10/15 1030 11/10/15 1638 11/12/15 0358  NA 129*  --  128*  K 4.6  --  3.7  CL 93*  --  96*  CO2 24  --  24  GLUCOSE 113*  --  88  BUN 30*  --  25*  CREATININE 1.17* 1.21* 1.06*  CALCIUM 9.3  --  8.2*    Studies/Results: Dg Chest 2 View  11/12/2015  CLINICAL DATA:  Renal cell carcinoma. EXAM: CHEST  2 VIEW COMPARISON:  11/10/2015 . FINDINGS: Prior cardiac valve replacement. Cardiomegaly with normal pulmonary vascularity. Mild left base subsegmental atelectasis and or infiltrate. No pleural effusion or pneumothorax. Thoracic spine scoliosis. IMPRESSION: 1. Prior cardiac valve replacement.  Cardiomegaly. 2. Mild left base subsegmental atelectasis and or infiltrate. Electronically Signed   By: Marcello Moores  Register   On: 11/12/2015 09:23    Medications: I have reviewed the patient's current medications.  Assessment/Plan: 1. Right renal mass  MRI 08/03/2015 with multiple suspicious liver lesions,  replacement of the right kidney consistent with an infiltrative neoplasm, probable rectoperineal adenopathy, extension of tumor into the IVC, small right pleural effusion  Staging CTs 08/08/2015 with no lung metastases, infiltrative right renal mass extending into the right renal vein and pararenal space, no lymphadenopathy, liver metastases  Ultrasound-guided biopsy of a right liver lesion 08/10/2015 with the preliminary pathology consistent with metastatic renal cell carcinoma, additional immunohistochemical histochemical stains most consistent with a poorly differentiated urothelial carcinoma  Initiation of pazopanib 08/17/2015, taken for 3 days  Cycle 1 Tecentriq 10/03/2015  Cycle 2 Tecentriq 10/24/2015 2. Right abdomen/flank discomfort secondary to #1  3. Family history of renal cell cancer  4. Anorexia/weight loss secondary to #1  5. Status post aortic valve replacement  6.Polymyalgia rheumatica- [place] on hold  7. Admission 08/20/2015 with nausea/vomiting and fever - CT abdomen/pelvis revealed new fluid attenuation surrounding the gastric antrum and hepatic flexure, stable fluid collection on a repeat CT 08/23/2015  placement of a drainage catheter 08/28/2015 with bilious fluid obtained, removed 09/18/2015  8. Probable aspiration pneumonia January 2017  9. Duodenal obstruction noted on endoscopy 08/28/2015  10. Placement of an internal/external biliary drain 08/29/2015, converted to an internal stent 09/21/2015  11. Fall 10/11/2015. Left thumb edema and ecchymosis; facial/nose edema and ecchymosis. Resolved.  12. Hyponatremia   13.   Admission 11/10/2015 with a fever and cough, now afebrile, cultures negative to date-probable upper respiratory infection  Ms. Drill has metastatic urothelial carcinoma. She has completed 2 treatments with Tecentriq.  She is scheduled for cycle 3 on 11/14/2015. She is now admitted with a febrile illness. I suspect she  has a viral upper respiratory infection. I doubt the clinical presentation is direct related to the urothelial carcinoma or toxicity from immunotherapy.  Recommendations: 1. Antibiotics per the internal medicine service 2. Increase ambulation as tolerated 3. Outpatient follow-up will be scheduled at the Cancer center for next week. 4. Discontinue prednisone while being treated with immunotherapy     LOS: 3 days   Betsy Coder, MD   11/13/2015, 8:32 AM

## 2015-11-13 NOTE — Progress Notes (Signed)
Physical Therapy Treatment Patient Details Name: Kim Donovan MRN: [id number] DOB: [birth year] Today's Date: 11/13/2015    History of Present Illness Kim Donovan is a 82 y.o. female with a history of HTN. PMR, PVD, Renal cell CA with liver mets and Glaucoma, with recent biopsy of a Large Right Renal Mass and a Hepatic lesion on 08/10/2015 who presents to the ED with complaints of Nausea and Vomiting and Fever     PT Comments    Pt motivated and with increased activity tolerance but continues unsteady on feet sans AD.  Pt admits to feeling much safer with RW and demonstrates much improved stability with same.  Pt would benefit from RW for home use  Follow Up Recommendations  No PT follow up     Equipment Recommendations  Rolling walker with 5" wheels    Recommendations for Other Services OT consult     Precautions / Restrictions Precautions Precautions: Fall Precaution Comments: Has fallen at home Restrictions Weight Bearing Restrictions: No    Mobility  Bed Mobility Overal bed mobility: Modified Independent             General bed mobility comments: Increased time but unassisted  Transfers Overall transfer level: Needs assistance Equipment used: None Transfers: Sit to/from Stand Sit to Stand: Min assist         General transfer comment: min assist to bring wt up and fwd and to steady in initial standing  Ambulation/Gait Ambulation/Gait assistance: Min assist;Min guard Ambulation Distance (Feet): 650 Feet Assistive device: Rolling walker (2 wheeled) Gait Pattern/deviations: Step-through pattern;Shuffle;Wide base of support     General Gait Details: Initial mild instability with pt reaching for furniture to steady.  Instability largely corrected with use of RW.  Pt states "I dont feel safe without the walker right now"   Stairs            Wheelchair Mobility    Modified Rankin (Stroke Patients Only)       Balance Overall balance  assessment: Needs assistance Sitting-balance support: No upper extremity supported;Feet supported Sitting balance-Leahy Scale: Good     Standing balance support: No upper extremity supported Standing balance-Leahy Scale: Fair                      Cognition Arousal/Alertness: Awake/alert Behavior During Therapy: WFL for tasks assessed/performed Overall Cognitive Status: Within Functional Limits for tasks assessed                      Exercises      General Comments        Pertinent Vitals/Pain Pain Assessment: No/denies pain    Home Living                      Prior Function            PT Goals (current goals can now be found in the care plan section) Acute Rehab PT Goals Patient Stated Goal: Regain IND and return home with spouse PT Goal Formulation: With patient Time For Goal Achievement: 11/24/15 Potential to Achieve Goals: Good    Frequency  Min 3X/week    PT Plan Current plan remains appropriate    Co-evaluation             End of Session Equipment Utilized During Treatment: Gait belt Activity Tolerance: Patient tolerated treatment well Patient left: in chair;with call bell/phone within reach;with family/visitor present     Time: [id number]  PT Time Calculation (min) (ACUTE ONLY): 23 min  Charges:  $Gait Training: 23-37 mins                    G Codes:      Marilla Boddy 2015-12-04, 11:00 AM

## 2015-11-13 NOTE — Progress Notes (Signed)
Pharmacy Antibiotic Note  Kim Donovan is a 82 y.o. female admitted on 11/10/2015 with fever, hypotension, and presumed pneumonia.  Pharmacy has been consulted for Vancomycin and Zosyn dosing.  Plan:  Zosyn 3.375g IV Q8H infused over 4hrs.  Continue Vancomycin 1g IV q24h.  Measure Vanc trough at steady state.  Goal 15-20.  Follow up renal fxn, culture results, and clinical course.  Follow up narrowing antibiotics.  Height: 5' 6.5" (168.9 cm) Weight: 148 lb 12.8 oz (67.495 kg) IBW/kg (Calculated) : 60.45  Temp (24hrs), Avg:98 F (36.7 C), Min:97.7 F (36.5 C), Max:98.3 F (36.8 C)   Recent Labs Lab 11/07/15 0952 11/10/15 1030 11/10/15 1337 11/10/15 1638 11/10/15 1927 11/12/15 0358  WBC 5.5 6.9  --  6.9  --  6.9  CREATININE 1.3* 1.17*  --  1.21*  --  1.06*  LATICACIDVEN  --   --  0.65 0.9 0.8  --     Estimated Creatinine Clearance: 37.7 mL/min (by C-G formula based on Cr of 1.06).    Allergies  Allergen Reactions  . Tramadol Rash    After surgery pt  broke out into a rash.Pt stop taking.    Antimicrobials this admission: Vancomycin 3/25 >> Pip/tazo 3/25 >> Oseltamivir 3/25 >> 3/26  Dose adjustments this admission: 3/29 0900 VT = ___  Microbiology results: 3/25 flu PCR: negative 3/25 blood: ngtd 3/25 MRSA: neg  Thank you for allowing pharmacy to be a part of this patient's care.  Gretta Arab PharmD, BCPS Pager [phone] 11/13/2015 10:27 AM

## 2015-11-14 ENCOUNTER — Other Ambulatory Visit: Payer: Medicare Other

## 2015-11-14 ENCOUNTER — Telehealth: Payer: Self-pay | Admitting: Oncology

## 2015-11-14 ENCOUNTER — Ambulatory Visit: Payer: Medicare Other

## 2015-11-14 ENCOUNTER — Other Ambulatory Visit: Payer: Self-pay | Admitting: *Deleted

## 2015-11-14 DIAGNOSIS — R6520 Severe sepsis without septic shock: Secondary | ICD-10-CM

## 2015-11-14 DIAGNOSIS — R5383 Other fatigue: Secondary | ICD-10-CM

## 2015-11-14 DIAGNOSIS — C7910 Secondary malignant neoplasm of unspecified urinary organs: Secondary | ICD-10-CM

## 2015-11-14 DIAGNOSIS — A419 Sepsis, unspecified organism: Principal | ICD-10-CM

## 2015-11-14 DIAGNOSIS — C787 Secondary malignant neoplasm of liver and intrahepatic bile duct: Secondary | ICD-10-CM

## 2015-11-14 DIAGNOSIS — E871 Hypo-osmolality and hyponatremia: Secondary | ICD-10-CM

## 2015-11-14 DIAGNOSIS — R0602 Shortness of breath: Secondary | ICD-10-CM

## 2015-11-14 DIAGNOSIS — R6889 Other general symptoms and signs: Secondary | ICD-10-CM

## 2015-11-14 DIAGNOSIS — J189 Pneumonia, unspecified organism: Secondary | ICD-10-CM

## 2015-11-14 LAB — BASIC METABOLIC PANEL
ANION GAP: 9 (ref 5–15)
BUN: 22 mg/dL — ABNORMAL HIGH (ref 6–20)
CALCIUM: 8.9 mg/dL (ref 8.9–10.3)
CO2: 23 mmol/L (ref 22–32)
Chloride: 101 mmol/L (ref 101–111)
Creatinine, Ser: 0.84 mg/dL (ref 0.44–1.00)
GLUCOSE: 89 mg/dL (ref 65–99)
POTASSIUM: 3.7 mmol/L (ref 3.5–5.1)
Sodium: 133 mmol/L — ABNORMAL LOW (ref 135–145)

## 2015-11-14 LAB — CBC
HEMATOCRIT: 28.3 % — AB (ref 36.0–46.0)
Hemoglobin: 9.7 g/dL — ABNORMAL LOW (ref 12.0–15.0)
MCH: 29.1 pg (ref 26.0–34.0)
MCHC: 34.3 g/dL (ref 30.0–36.0)
MCV: 85 fL (ref 78.0–100.0)
PLATELETS: 244 10*3/uL (ref 150–400)
RBC: 3.33 MIL/uL — AB (ref 3.87–5.11)
RDW: 15.8 % — AB (ref 11.5–15.5)
WBC: 6.4 10*3/uL (ref 4.0–10.5)

## 2015-11-14 MED ORDER — ALPRAZOLAM 0.25 MG PO TABS: 0.2500 mg | ORAL_TABLET | Freq: Every evening | ORAL | Status: DC | PRN

## 2015-11-14 MED ORDER — AMOXICILLIN-POT CLAVULANATE 875-125 MG PO TABS: 1.0000 | ORAL_TABLET | Freq: Two times a day (BID) | ORAL | Status: DC

## 2015-11-14 NOTE — Discharge Summary (Signed)
Physician Discharge Summary  Kim Donovan BZM:[id number] DOB: 1929-06-08 DOA: 11/10/2015  PCP: Mathews Argyle, MD  Admit date: 11/10/2015 Discharge date: 11/14/2015  Time spent: 40 minutes  Recommendations for Outpatient Follow-up:  1. Follow up with PCP in 1 week.   Discharge Diagnoses:  Principal Problem:   Severe sepsis ([place]) Active Problems:   Fatigue   Hyponatremia   Hepatic metastases (HCC)   Metastatic urothelial carcinoma (HCC)   SOB (shortness of breath)   Influenza-like symptoms   Sepsis ([place])   HCAP (healthcare-associated pneumonia)   Discharge Condition: Stable  Diet recommendation: heart Healthy  Filed Weights   11/11/15 1500  Weight: 67.495 kg (148 lb 12.8 oz)    History of present illness:  Kim Donovan is an 82 y.o. female with a PMH of polymyalgia rheumatica, peripheral vascular disease, hypertension, and renal cell carcinoma with liver metastases on immunotherapy who presents to the hospital with a chief complaint of upper respiratory type symptoms for the last week. Symptoms have included nasal and chest congestion, sore throat, and a moist cough without expectoration. She also describes worsening fatigue. Over the past 24 hours, she started to run fevers, and since she is on chronic immunotherapy, she knows to call her MD, and when this occurred, she came to the ED for further evaluation. No specific aggravating or alleviating factors. Upon initial evaluation in the ED, she was noted to be hypotensive with blood pressure in the 22E systolic. Blood pressure responded to fluid challenge. Chest x-ray was clear. Urinalysis negative for signs of infection. WBC 6.9. Lactic acid 0.65.  Hospital Course:   Sepsis [facility]) due to HCAP: patient with hx of Chronic immunosuppression -Sepsis order set utilized in ED.  -Met 2 or more SIRS criteria on admission (? Temp > 100.9 ; HR >90; RR >20 and hypotension) -patient sepsis features resolving  -Repeated CXR on  3/27 demonstrating left subsegmental infiltrates -Influenza panel neg; will d/c tamiflu  -will transition to augmentin PO regimen (will aim to treat for a total of 8 days). Follow culture data -will continue pulmicort, loratadine and flutter valve -Discharge on Augmentin for 5 more days.   Stage III chronic kidney disease -Creatinine consistent with usual baseline values after IVF's given -Creatinine is 0.8 on discharge.   Fatigue/history of PMR -Has history of polymyalgia rheumatica which may be contributory.  -ESR elevated; but mainly from acute infection -Also reports shoulder pain, which is similar to when she was diagnosed with PMR in 2012. (improved and better today) -PT therapy evaluation completed, found patient at baseline and stable to return home with prior care arrangements and use of a walker. -discussed with Dr. Learta Codding and he recommended NOT to use steroids given ongoing immunotherapy    Hyponatremia -Has been a chronic issue.  -will continue hydration and follow electrolytes trend. -has remained stable.   Metastatic urothelial carcinoma ([place]) with hepatic metastasis -Completed cycle 2 Tecentriq 10/24/2015. Third cycle planned 11/14/15. -Dr. Benay Spice notified of the patient's admission. -will anticipate treatment will be most likely postponed until acute illness overcome    Procedures:  None  Consultations:  None  Discharge Exam: Filed Vitals:   11/13/15 2041 11/14/15 0502  BP: 128/78 134/75  Pulse: 89 88  Temp: 97.7 F (36.5 C) 98.1 F (36.7 C)  Resp: 16 16   General: Alert and awake, oriented x3, not in any acute distress. HEENT: anicteric sclera, pupils reactive to light and accommodation, EOMI CVS: S1-S2 clear, no murmur rubs or gallops Chest: clear  to auscultation bilaterally, no wheezing, rales or rhonchi Abdomen: soft nontender, nondistended, normal bowel sounds, no organomegaly Extremities: no cyanosis, clubbing or edema noted  bilaterally Neuro: Cranial nerves II-XII intact, no focal neurological deficits  Discharge Instructions   Discharge Instructions    Diet - low sodium heart healthy    Complete by:  As directed      Increase activity slowly    Complete by:  As directed           Current Discharge Medication List    START taking these medications   Details  amoxicillin-clavulanate (AUGMENTIN) 875-125 MG tablet Take 1 tablet by mouth every 12 (twelve) hours. Qty: 10 tablet, Refills: 0      CONTINUE these medications which have NOT CHANGED   Details  ALPRAZolam (XANAX) 0.25 MG tablet Take 1 tablet (0.25 mg total) by mouth at bedtime as needed for anxiety. Qty: 30 tablet, Refills: 0   Associated Diagnoses: Metastatic urothelial carcinoma (HCC)    aspirin 81 MG tablet Take 81 mg by mouth daily.      Calcium Citrate-Vitamin D 250-200 MG-UNIT TABS Take 2 tablets by mouth daily.     dextromethorphan-guaiFENesin (MUCINEX DM) 30-600 MG 12hr tablet Take 1 tablet by mouth every 4 (four) hours as needed for cough.    Diphenhydramine-APAP, sleep, (TYLENOL PM EXTRA STRENGTH PO) Take 1 tablet by mouth at bedtime as needed (pain/sleep).     dorzolamide-timolol (COSOPT) 22.3-6.8 MG/ML ophthalmic solution Place 1 drop into the left eye 2 (two) times daily.     dronabinol (MARINOL) 2.5 MG capsule Take 1 capsule (2.5 mg total) by mouth 2 (two) times daily before lunch and supper. Qty: 60 capsule, Refills: 0   Associated Diagnoses: Metastatic urothelial carcinoma ([place]); Hepatic metastases (HCC)    ezetimibe (ZETIA) 10 MG tablet Take 1 tablet (10 mg total) by mouth daily. Qty: 30 tablet, Refills: 6    furosemide (LASIX) 20 MG tablet Take 1 tablet (20 mg total) by mouth daily. Qty: 30 tablet, Refills: 6    Multiple Vitamins-Minerals (CENTRUM SILVER ULTRA WOMENS PO) Take 1 tablet by mouth daily.      Omega-3 Fatty Acids (FISH OIL) 1000 MG CAPS Take 1 capsule by mouth daily. Reported on 08/06/2015     ondansetron (ZOFRAN) 4 MG tablet Take 1 tablet (4 mg total) by mouth every 6 (six) hours as needed for nausea. Qty: 20 tablet, Refills: 1    pantoprazole (PROTONIX) 40 MG tablet Take 1 tablet (40 mg total) by mouth daily. Qty: 30 tablet, Refills: 1   Associated Diagnoses: Metastatic urothelial carcinoma ([place]); Hepatic metastases (HCC)       Allergies  Allergen Reactions  . Tramadol Rash    After surgery pt  broke out into a rash.Pt stop taking.   Follow-up Information    Follow up with Mathews Argyle, MD In 1 week.   Specialty:  Internal Medicine   Contact information:   [address] [place] [address] [phone]        The results of significant diagnostics from this hospitalization (including imaging, microbiology, ancillary and laboratory) are listed below for reference.    Significant Diagnostic Studies: Dg Chest 2 View  11/12/2015  CLINICAL DATA:  Renal cell carcinoma. EXAM: CHEST  2 VIEW COMPARISON:  11/10/2015 . FINDINGS: Prior cardiac valve replacement. Cardiomegaly with normal pulmonary vascularity. Mild left base subsegmental atelectasis and or infiltrate. No pleural effusion or pneumothorax. Thoracic spine scoliosis. IMPRESSION: 1. Prior cardiac valve replacement.  Cardiomegaly. 2. Mild left base subsegmental atelectasis and or infiltrate. Electronically Signed   By: Marcello Moores  Register   On: 11/12/2015 09:23   Dg Chest 2 View  11/10/2015  CLINICAL DATA:  Patient states she has had a dry cough x 1 week. Hx of bronchitis, heart valve replacement, previous smoker, PVD, aortic stenosis, hypertension. EXAM: CHEST  2 VIEW COMPARISON:  10/10/2015 FINDINGS: Sternotomy wires overlie normal cardiac silhouette. Prosthetic valve noted. No effusion, infiltrate pneumothorax. Lungs are hyperinflated. IMPRESSION: Hyperinflated lungs.  No acute findings. Electronically Signed   By: Suzy Bouchard M.D.   On: 11/10/2015 11:06    Microbiology: Recent Results  (from the past 240 hour(s))  Blood culture (routine x 2)     Status: None (Preliminary result)   Collection Time: 11/10/15 10:30 AM  Result Value Ref Range Status   Specimen Description BLOOD RIGHT FOREARM  Final   Special Requests   Final    BOTTLES DRAWN AEROBIC AND ANAEROBIC 5CC BOTH BOTTLES   Culture   Final    NO GROWTH 3 DAYS Performed at [facility]    Report Status PENDING  Incomplete  Blood culture (routine x 2)     Status: None (Preliminary result)   Collection Time: 11/10/15 10:43 AM  Result Value Ref Range Status   Specimen Description BLOOD LEFT ANTECUBITAL  Final   Special Requests BOTTLES DRAWN AEROBIC AND ANAEROBIC 5 [place]  Final   Culture   Final    NO GROWTH 3 DAYS Performed at [facility]    Report Status PENDING  Incomplete  MRSA PCR Screening     Status: None   Collection Time: 11/10/15  3:26 PM  Result Value Ref Range Status   MRSA by PCR NEGATIVE NEGATIVE Final    Comment:        The GeneXpert MRSA Assay (FDA approved for NASAL specimens only), is one component of a comprehensive MRSA colonization surveillance program. It is not intended to diagnose MRSA infection nor to guide or monitor treatment for MRSA infections.      Labs: Basic Metabolic Panel:  Recent Labs Lab 11/10/15 1030 11/10/15 1638 11/12/15 0358 11/14/15 0323  NA 129*  --  128* 133*  K 4.6  --  3.7 3.7  CL 93*  --  96* 101  CO2 24  --  24 23  GLUCOSE 113*  --  88 89  BUN 30*  --  25* 22*  CREATININE 1.17* 1.21* 1.06* 0.84  CALCIUM 9.3  --  8.2* 8.9   Liver Function Tests:  Recent Labs Lab 11/10/15 1030  AST 65*  ALT 40  ALKPHOS 242*  BILITOT 0.8  PROT 8.1  ALBUMIN 3.7   No results for input(s): LIPASE, AMYLASE in the last 168 hours. No results for input(s): AMMONIA in the last 168 hours. CBC:  Recent Labs Lab 11/10/15 1030 11/10/15 1638 11/12/15 0358 11/14/15 0323  WBC 6.9 6.9 6.9 6.4  NEUTROABS 5.0  --   --   --   HGB 10.7* 9.6*  9.3* 9.7*  HCT 31.1* 28.3* 27.7* 28.3*  MCV 83.8 85.0 86.8 85.0  PLT 259 214 191 244   Cardiac Enzymes: No results for input(s): CKTOTAL, CKMB, CKMBINDEX, TROPONINI in the last 168 hours. BNP: BNP (last 3 results)  Recent Labs  05/15/15 1115 06/15/15 0955  BNP 111.4* 100.1*    ProBNP (last 3 results) No results for input(s): PROBNP in the last 8760 hours.  CBG: No results for input(s): GLUCAP  in the last 168 hours.     Signed:  Birdie Hopes MD.  [facility] 11/14/2015, 10:31 AM

## 2015-11-14 NOTE — NC FL2 (Signed)
[place] MEDICAID FL2 LEVEL OF CARE SCREENING TOOL     IDENTIFICATION  Patient Name: Kim Donovan Birthdate: [birth year] Sex: female Admission Date (Current Location): 11/10/2015  [facility] and [place] Number:  Herbalist and Address:  [facility],  [place] [address] [place]      Provider Number: [id number]  Attending Physician Name and Address:  Verlee Monte, MD  Relative Name and Phone Number:       Current Level of Care: Hospital Recommended Level of Care: [place] Prior Approval Number:    Date Approved/Denied:   PASRR Number: [id number]  Discharge Plan: SNF    Current Diagnoses: Patient Active Problem List   Diagnosis Date Noted  . Sepsis ([place])   . HCAP (healthcare-associated pneumonia)   . SOB (shortness of breath)   . Influenza-like symptoms   . Severe sepsis ([place]) 11/10/2015  . Metastatic urothelial carcinoma ([place]) 09/14/2015  . Duodenal stenosis   . Biliary obstruction   . Bile duct rupture   . Biliary obstruction due to malignant neoplasm   . Transaminitis 08/27/2015  . Fever, unspecified 08/27/2015  . Abdominal fluid collection   . Elevated LFTs   . Acquired dilation of bile duct   . Elevated lipase 08/26/2015  . CAP (community acquired pneumonia) 08/22/2015  . AKI (acute kidney injury) ([place]) 08/20/2015  . Nausea and vomiting 08/20/2015  . Hyponatremia 08/20/2015  . Essential hypertension 08/20/2015  . Intraabdominal fluid collection 08/20/2015  . Hepatic metastases ([place])   . Renal mass   . Liver masses   . Renal mass, right 08/06/2015  . Chronic diastolic CHF (congestive heart failure) ([place]) 06/15/2015  . S/P AVR (aortic valve replacement) 05/04/2014  . Increased urinary frequency 06/09/2011  . Fatigue 06/09/2011  . Polymyalgia rheumatica ([place]) 11/05/2010  . Reactive depression (situational) 11/05/2010  . Aortic stenosis   . PAIN IN JOINT, SITE UNSPECIFIED 07/02/2010  . GLAUCOMA 05/29/2009   . HYPERLIPIDEMIA 05/06/2007  . HYPERTENSION 05/06/2007  . VENOUS INSUFFICIENCY 05/06/2007  . OSTEOARTHRITIS 05/06/2007  . Osteoporosis, unspecified 04/30/2007    Orientation RESPIRATION BLADDER Height & Weight     Self, Time, Situation, Place  Normal Continent Weight: 148 lb 12.8 oz (67.495 kg) Height:  5' 6.5" (168.9 cm)  BEHAVIORAL SYMPTOMS/MOOD NEUROLOGICAL BOWEL NUTRITION STATUS   (no behaviors)  (NONE) Continent Diet (heart Healthy)  AMBULATORY STATUS COMMUNICATION OF NEEDS Skin   Limited Assist Verbally Normal                       Personal Care Assistance Level of Assistance  Bathing, Feeding, Dressing Bathing Assistance: Limited assistance Feeding assistance: Independent Dressing Assistance: Limited assistance     Functional Limitations Info  Sight, Hearing, Speech Sight Info: Impaired Hearing Info: Adequate Speech Info: Adequate    SPECIAL CARE FACTORS FREQUENCY                       Contractures Contractures Info: Not present    Additional Factors Info  Code Status, Allergies Code Status Info: DNR code status Allergies Info: Tramadol           Current Medications (11/14/2015):  This is the current hospital active medication list Current Facility-Administered Medications  Medication Dose Route Frequency Provider Last Rate Last Dose  . 0.9 %  sodium chloride infusion   Intravenous Continuous Barton Dubois, MD 50 mL/hr at 11/13/15 2200    . albuterol (PROVENTIL) (2.5 MG/3ML) 0.083%  nebulizer solution 2.5 mg  2.5 mg Nebulization Q4H PRN Barton Dubois, MD      . ALPRAZolam Duanne Moron) tablet 0.25 mg  0.25 mg Oral QHS PRN Venetia Maxon Rama, MD   0.25 mg at 11/13/15 2105  . amoxicillin-clavulanate (AUGMENTIN) 875-125 MG per tablet 1 tablet  1 tablet Oral Q12H Barton Dubois, MD   1 tablet at 11/14/15 0949  . aspirin EC tablet 81 mg  81 mg Oral Daily Venetia Maxon Rama, MD   81 mg at 11/14/15 0950  . budesonide (PULMICORT) nebulizer solution 0.25 mg  0.25  mg Nebulization BID Barton Dubois, MD   0.25 mg at 11/13/15 2119  . calcium-vitamin D (OSCAL WITH D) 500-200 MG-UNIT per tablet 2 tablet  2 tablet Oral Q breakfast Venetia Maxon Rama, MD   2 tablet at 11/14/15 0949  . chlorpheniramine-HYDROcodone (TUSSIONEX) 10-8 MG/5ML suspension 5 mL  5 mL Oral Q12H PRN Barton Dubois, MD   5 mL at 11/12/15 1457  . dextromethorphan (DELSYM) 30 MG/5ML liquid 30 mg  30 mg Oral BID PRN Barton Dubois, MD   30 mg at 11/13/15 2133  . dorzolamide-timolol (COSOPT) 22.3-6.8 MG/ML ophthalmic solution 1 drop  1 drop Left Eye BID Venetia Maxon Rama, MD   1 drop at 11/14/15 0954  . dronabinol (MARINOL) capsule 2.5 mg  2.5 mg Oral BID AC Venetia Maxon Rama, MD   2.5 mg at 11/13/15 1921  . enoxaparin (LOVENOX) injection 40 mg  40 mg Subcutaneous Q24H Venetia Maxon Rama, MD   40 mg at 11/13/15 1921  . ezetimibe (ZETIA) tablet 10 mg  10 mg Oral Daily Venetia Maxon Rama, MD   10 mg at 11/14/15 0949  . loratadine (CLARITIN) tablet 10 mg  10 mg Oral Daily Barton Dubois, MD   10 mg at 11/14/15 0949  . multivitamin with minerals tablet 1 tablet  1 tablet Oral Daily Venetia Maxon Rama, MD   1 tablet at 11/14/15 0948  . omega-3 acid ethyl esters (LOVAZA) capsule 1 g  1 g Oral Daily Venetia Maxon Rama, MD   1 g at 11/14/15 0950  . ondansetron (ZOFRAN) tablet 4 mg  4 mg Oral Q6H PRN Venetia Maxon Rama, MD   4 mg at 11/11/15 0018  . pantoprazole (PROTONIX) EC tablet 40 mg  40 mg Oral Daily Venetia Maxon Rama, MD   40 mg at 11/14/15 0950  . sodium chloride flush (NS) 0.9 % injection 3 mL  3 mL Intravenous Q12H Venetia Maxon Rama, MD   3 mL at 11/10/15 2114     Discharge Medications: Please see discharge summary for a list of discharge medications.  Relevant Imaging Results:  Relevant Lab Results:   Additional Information SSN: [id number]  Roderica Cathell A, LCSW

## 2015-11-14 NOTE — Progress Notes (Signed)
Per nursing staff.  Pt doesn't need help with ADLs she is just requesting a walker.  Order placed for RW and [facility] DME rep contacted for referral.  No other Cm needs communicated. Marney Doctor RN,BSN,NCM [phone]

## 2015-11-14 NOTE — Telephone Encounter (Signed)
Gave and printed appt sched and avs for pt for April °

## 2015-11-14 NOTE — Progress Notes (Signed)
CSW received phone call from RN that pt from [facility] and pt concerned about returning to her apartment.  PT recommended No PT follow up - pt ambulated 650 ft min guard assist.   CSW contacted admissions at [facility] to inquire about pt being able to go to SNF. [place] admissions reports that pt insurance will not cover SNF, but pt has 13 free SNF stay days as pt is a resident of the [facility].   CSW met with pt at bedside to discuss and notified pt that pt insurance will not cover SNF, but [facility] reports that pt has option to come to SNF level as pt has 13 free SNF days remaining. Pt agreeable to going to SNF level as she does not feel that she is ready to be back at independent living apartment yet.   CSW completed FL2 and sent discharge information to [facility]. CSW confirmed with [facility] that information received.  Pt states that she will contact pt family re: transportation for discharge today.   Discharge packet provided to RN to provide to [facility] upon arrival to facility.  No further social work needs identified at this time.  CSW signing off.   Alison Murray, MSW, [place] Work [phone]

## 2015-11-15 LAB — CULTURE, BLOOD (ROUTINE X 2)
CULTURE: NO GROWTH
Culture: NO GROWTH

## 2015-11-20 ENCOUNTER — Ambulatory Visit (HOSPITAL_BASED_OUTPATIENT_CLINIC_OR_DEPARTMENT_OTHER): Payer: Medicare Other

## 2015-11-20 ENCOUNTER — Other Ambulatory Visit (HOSPITAL_BASED_OUTPATIENT_CLINIC_OR_DEPARTMENT_OTHER): Payer: Medicare Other

## 2015-11-20 ENCOUNTER — Ambulatory Visit (HOSPITAL_BASED_OUTPATIENT_CLINIC_OR_DEPARTMENT_OTHER): Payer: Medicare Other | Admitting: Oncology

## 2015-11-20 ENCOUNTER — Telehealth: Payer: Self-pay | Admitting: Oncology

## 2015-11-20 VITALS — BP 120/64 | HR 82 | Temp 97.6°F | Resp 18 | Ht 66.5 in | Wt 146.1 lb

## 2015-11-20 DIAGNOSIS — C787 Secondary malignant neoplasm of liver and intrahepatic bile duct: Secondary | ICD-10-CM

## 2015-11-20 DIAGNOSIS — Z5112 Encounter for antineoplastic immunotherapy: Secondary | ICD-10-CM

## 2015-11-20 DIAGNOSIS — C641 Malignant neoplasm of right kidney, except renal pelvis: Secondary | ICD-10-CM

## 2015-11-20 DIAGNOSIS — Z79899 Other long term (current) drug therapy: Secondary | ICD-10-CM | POA: Diagnosis not present

## 2015-11-20 DIAGNOSIS — C7910 Secondary malignant neoplasm of unspecified urinary organs: Secondary | ICD-10-CM

## 2015-11-20 LAB — COMPREHENSIVE METABOLIC PANEL
ALT: 29 U/L (ref 0–55)
AST: 46 U/L — AB (ref 5–34)
Albumin: 2.7 g/dL — ABNORMAL LOW (ref 3.5–5.0)
Alkaline Phosphatase: 204 U/L — ABNORMAL HIGH (ref 40–150)
Anion Gap: 9 mEq/L (ref 3–11)
BUN: 18.9 mg/dL (ref 7.0–26.0)
CO2: 26 meq/L (ref 22–29)
Calcium: 9.7 mg/dL (ref 8.4–10.4)
Chloride: 100 mEq/L (ref 98–109)
Creatinine: 0.9 mg/dL (ref 0.6–1.1)
EGFR: 58 mL/min/{1.73_m2} — AB (ref >90)
GLUCOSE: 105 mg/dL (ref 70–140)
POTASSIUM: 4.3 meq/L (ref 3.5–5.1)
SODIUM: 134 meq/L — AB (ref 136–145)
TOTAL PROTEIN: 7.1 g/dL (ref 6.4–8.3)
Total Bilirubin: 0.41 mg/dL (ref 0.20–1.20)

## 2015-11-20 LAB — CBC WITH DIFFERENTIAL/PLATELET
BASO%: 0.9 % (ref 0.0–2.0)
BASOS ABS: 0.1 10*3/uL (ref 0.0–0.1)
EOS%: 7.5 % — AB (ref 0.0–7.0)
Eosinophils Absolute: 0.6 10*3/uL — ABNORMAL HIGH (ref 0.0–0.5)
HCT: 27.3 % — ABNORMAL LOW (ref 34.8–46.6)
HEMOGLOBIN: 9 g/dL — AB (ref 11.6–15.9)
LYMPH#: 1.3 10*3/uL (ref 0.9–3.3)
LYMPH%: 17 % (ref 14.0–49.7)
MCH: 28 pg (ref 25.1–34.0)
MCHC: 33 g/dL (ref 31.5–36.0)
MCV: 84.8 fL (ref 79.5–101.0)
MONO#: 1 10*3/uL — ABNORMAL HIGH (ref 0.1–0.9)
MONO%: 12.6 % (ref 0.0–14.0)
NEUT#: 4.8 10*3/uL (ref 1.5–6.5)
NEUT%: 62 % (ref 38.4–76.8)
NRBC: 0 % (ref 0–0)
Platelets: 342 10*3/uL (ref 145–400)
RBC: 3.22 10*6/uL — AB (ref 3.70–5.45)
RDW: 15.9 % — AB (ref 11.2–14.5)
WBC: 7.7 10*3/uL (ref 3.9–10.3)

## 2015-11-20 LAB — TSH: TSH: 1.168 m[IU]/L (ref 0.308–3.960)

## 2015-11-20 MED ORDER — SODIUM CHLORIDE 0.9 % IV SOLN
1200.0000 mg | Freq: Once | INTRAVENOUS | Status: AC
  Administered 2015-11-20: 1200 mg via INTRAVENOUS
  Filled 2015-11-20: qty 20

## 2015-11-20 MED ORDER — SODIUM CHLORIDE 0.9 % IV SOLN
Freq: Once | INTRAVENOUS | Status: AC
  Administered 2015-11-20: 13:00:00 via INTRAVENOUS

## 2015-11-20 NOTE — Patient Instructions (Signed)
[facility] Discharge Instructions for Patients Receiving Chemotherapy  Today you received the following chemotherapy agents Tecentriq To help prevent nausea and vomiting after your treatment, we encourage you to take your nausea medication as prescribed.   If you develop nausea and vomiting that is not controlled by your nausea medication, call the clinic.   BELOW ARE SYMPTOMS THAT SHOULD BE REPORTED IMMEDIATELY:  *FEVER GREATER THAN 100.5 F  *CHILLS WITH OR WITHOUT FEVER  NAUSEA AND VOMITING THAT IS NOT CONTROLLED WITH YOUR NAUSEA MEDICATION  *UNUSUAL SHORTNESS OF BREATH  *UNUSUAL BRUISING OR BLEEDING  TENDERNESS IN MOUTH AND THROAT WITH OR WITHOUT PRESENCE OF ULCERS  *URINARY PROBLEMS  *BOWEL PROBLEMS  UNUSUAL RASH Items with * indicate a potential emergency and should be followed up as soon as possible.  Feel free to call the clinic you have any questions or concerns. The clinic phone number is [phone].  Please show the CHEMO ALERT CARD at check-in to the Emergency Department and triage nurse.   

## 2015-11-20 NOTE — Progress Notes (Signed)
  [facility] OFFICE PROGRESS NOTE   Diagnosis:  Metastatic urothelial carcinoma  INTERVAL HISTORY:    Kim Donovan returns for scheduled visit. She was discharged from the hospital after admission with an upper respiratory infection. She reports feeling better and completed an outpatient course of Augmentin. No recurrent fever. She continues to have a cough. She is ambulating at the skilled nursing facility. No nausea or vomiting. No pain.  Objective:  Vital signs in last 24 hours:  Blood pressure 120/64, pulse 82, temperature 97.6 F (36.4 C), temperature source Oral, resp. rate 18, height 5' 6.5" (1.689 m), weight 146 lb 1.6 oz (66.271 kg), SpO2 100 %.    HEENT:  No thrush or ulcers Resp:  Coarse rhonchi at the left lower posterior chest, no respiratory distress Cardio:  Regular rate and rhythm GI:  No hepatomegaly, no mass, nontender Vascular:  Trace low leg edema on the right greater than left    Portacath/PICC-without erythema  Lab Results:  Lab Results  Component Value Date   WBC 7.7 11/20/2015   HGB 9.0* 11/20/2015   HCT 27.3* 11/20/2015   MCV 84.8 11/20/2015   PLT 342 11/20/2015   NEUTROABS 4.8 11/20/2015     Medications: I have reviewed the patient's current medications.  Assessment/Plan: 1. Right renal mass  MRI 08/03/2015 with multiple suspicious liver lesions, replacement of the right kidney consistent with an infiltrative neoplasm, probable rectoperineal adenopathy, extension of tumor into the IVC, small right pleural effusion  Staging CTs 08/08/2015 with no lung metastases, infiltrative right renal mass extending into the right renal vein and pararenal space, no lymphadenopathy, liver metastases  Ultrasound-guided biopsy of a right liver lesion 08/10/2015 with the preliminary pathology consistent with metastatic renal cell carcinoma, additional immunohistochemical histochemical stains most consistent with a poorly differentiated urothelial  carcinoma  Initiation of pazopanib 08/17/2015, taken for 3 days  Cycle 1 Tecentriq 10/03/2015  Cycle 2 Tecentriq 10/24/2015   cycle 3 Tecentriq  11/20/2015  2. Right abdomen/flank discomfort secondary to #1  3. Family history of renal cell cancer  4. Anorexia/weight loss secondary to #1  5. Status post aortic valve replacement  6.Polymyalgia rheumatica- [place] on hold  7. Admission 08/20/2015 with nausea/vomiting and fever - CT abdomen/pelvis revealed new fluid attenuation surrounding the gastric antrum and hepatic flexure, stable fluid collection on a repeat CT 08/23/2015  placement of a drainage catheter 08/28/2015 with bilious fluid obtained, removed 09/18/2015  8. Probable aspiration pneumonia January 2017  9. Duodenal obstruction noted on endoscopy 08/28/2015  10. Placement of an internal/external biliary drain 08/29/2015, converted to an internal stent 09/21/2015  11. Fall 10/11/2015. Left thumb edema and ecchymosis; facial/nose edema and ecchymosis. Resolved.  12. Hyponatremia   13. Admission 11/10/2015 with a fever and cough,  Treated for pneumonia with clinical improvement   Disposition:   Kim Donovan is recovering from the recent hospital admission with an upper respiratory infection. I have a low suspicion for the respiratory symptoms and fever being related to toxicity from the Tecentriq. There is no clinical evidence of progressive urothelial carcinoma. The plan is to proceed with another cycle of tecentriq today. She will return for an office visit  In 3 weeks. We will schedule a restaging CT evaluation after cycle 4.    Kim Donovan will continue to increase her activity level as tolerated.  Kim Coder, MD  11/20/2015  2:35 PM

## 2015-11-20 NOTE — Telephone Encounter (Signed)
Gave and printed appt sched and avs for pt for april

## 2015-11-23 ENCOUNTER — Telehealth: Payer: Self-pay | Admitting: Oncology

## 2015-11-23 NOTE — Telephone Encounter (Signed)
per pof to cx pt appt on 4/10-cld pt & left a message to keep 4/25 appt and cx 4/10 per Dr Benay Spice

## 2015-11-26 ENCOUNTER — Other Ambulatory Visit: Payer: Medicare Other

## 2015-11-26 ENCOUNTER — Ambulatory Visit: Payer: Medicare Other | Admitting: Oncology

## 2015-12-05 ENCOUNTER — Ambulatory Visit: Payer: Medicare Other

## 2015-12-09 ENCOUNTER — Other Ambulatory Visit: Payer: Self-pay | Admitting: Oncology

## 2015-12-11 ENCOUNTER — Telehealth: Payer: Self-pay | Admitting: Oncology

## 2015-12-11 ENCOUNTER — Other Ambulatory Visit (HOSPITAL_BASED_OUTPATIENT_CLINIC_OR_DEPARTMENT_OTHER): Payer: Medicare Other

## 2015-12-11 ENCOUNTER — Telehealth: Payer: Self-pay | Admitting: *Deleted

## 2015-12-11 ENCOUNTER — Ambulatory Visit (HOSPITAL_BASED_OUTPATIENT_CLINIC_OR_DEPARTMENT_OTHER): Payer: Medicare Other

## 2015-12-11 ENCOUNTER — Ambulatory Visit (HOSPITAL_BASED_OUTPATIENT_CLINIC_OR_DEPARTMENT_OTHER): Payer: Medicare Other | Admitting: Nurse Practitioner

## 2015-12-11 VITALS — BP 136/76 | HR 90 | Temp 97.4°F | Resp 18 | Ht 66.5 in | Wt 142.8 lb

## 2015-12-11 DIAGNOSIS — C641 Malignant neoplasm of right kidney, except renal pelvis: Secondary | ICD-10-CM

## 2015-12-11 DIAGNOSIS — R609 Edema, unspecified: Secondary | ICD-10-CM

## 2015-12-11 DIAGNOSIS — C7910 Secondary malignant neoplasm of unspecified urinary organs: Secondary | ICD-10-CM

## 2015-12-11 DIAGNOSIS — Z5112 Encounter for antineoplastic immunotherapy: Secondary | ICD-10-CM

## 2015-12-11 DIAGNOSIS — C787 Secondary malignant neoplasm of liver and intrahepatic bile duct: Secondary | ICD-10-CM

## 2015-12-11 LAB — CBC WITH DIFFERENTIAL/PLATELET
BASO%: 0.4 % (ref 0.0–2.0)
Basophils Absolute: 0 10*3/uL (ref 0.0–0.1)
EOS%: 6.7 % (ref 0.0–7.0)
Eosinophils Absolute: 0.5 10*3/uL (ref 0.0–0.5)
HCT: 29.6 % — ABNORMAL LOW (ref 34.8–46.6)
HGB: 9.6 g/dL — ABNORMAL LOW (ref 11.6–15.9)
LYMPH%: 17.9 % (ref 14.0–49.7)
MCH: 27.3 pg (ref 25.1–34.0)
MCHC: 32.4 g/dL (ref 31.5–36.0)
MCV: 84.1 fL (ref 79.5–101.0)
MONO#: 0.9 10*3/uL (ref 0.1–0.9)
MONO%: 12.7 % (ref 0.0–14.0)
NEUT%: 62.3 % (ref 38.4–76.8)
NEUTROS ABS: 4.5 10*3/uL (ref 1.5–6.5)
Platelets: 293 10*3/uL (ref 145–400)
RBC: 3.52 10*6/uL — AB (ref 3.70–5.45)
RDW: 15.8 % — ABNORMAL HIGH (ref 11.2–14.5)
WBC: 7.3 10*3/uL (ref 3.9–10.3)
lymph#: 1.3 10*3/uL (ref 0.9–3.3)

## 2015-12-11 LAB — UA PROTEIN, DIPSTICK - CHCC: PROTEIN: NEGATIVE mg/dL

## 2015-12-11 LAB — COMPREHENSIVE METABOLIC PANEL
ALT: 33 U/L (ref 0–55)
ANION GAP: 9 meq/L (ref 3–11)
AST: 65 U/L — AB (ref 5–34)
Albumin: 2.9 g/dL — ABNORMAL LOW (ref 3.5–5.0)
Alkaline Phosphatase: 295 U/L — ABNORMAL HIGH (ref 40–150)
BILIRUBIN TOTAL: 0.35 mg/dL (ref 0.20–1.20)
BUN: 25.7 mg/dL (ref 7.0–26.0)
CHLORIDE: 98 meq/L (ref 98–109)
CO2: 26 meq/L (ref 22–29)
CREATININE: 1.1 mg/dL (ref 0.6–1.1)
Calcium: 11.1 mg/dL — ABNORMAL HIGH (ref 8.4–10.4)
EGFR: 45 mL/min/{1.73_m2} — ABNORMAL LOW (ref >90)
Glucose: 83 mg/dl (ref 70–140)
Potassium: 4.8 mEq/L (ref 3.5–5.1)
Sodium: 133 mEq/L — ABNORMAL LOW (ref 136–145)
Total Protein: 8 g/dL (ref 6.4–8.3)

## 2015-12-11 MED ORDER — SODIUM CHLORIDE 0.9 % IV SOLN
Freq: Once | INTRAVENOUS | Status: AC
  Administered 2015-12-11: 11:00:00 via INTRAVENOUS

## 2015-12-11 MED ORDER — SODIUM CHLORIDE 0.9 % IV SOLN
1200.0000 mg | Freq: Once | INTRAVENOUS | Status: AC
  Administered 2015-12-11: 1200 mg via INTRAVENOUS
  Filled 2015-12-11: qty 20

## 2015-12-11 MED ORDER — DRONABINOL 2.5 MG PO CAPS: 2.5000 mg | ORAL_CAPSULE | Freq: Two times a day (BID) | ORAL | Status: DC

## 2015-12-11 MED ORDER — ALPRAZOLAM 0.25 MG PO TABS: 0.2500 mg | ORAL_TABLET | Freq: Every evening | ORAL | Status: AC | PRN

## 2015-12-11 NOTE — Telephone Encounter (Signed)
per pof to sch pt appt-sent MW emailto sch trmt-pt to getupdated copy b4 leaving** °

## 2015-12-11 NOTE — Progress Notes (Signed)
Called in Xanax prescription to [facility] on [address]. No prior Auth needed

## 2015-12-11 NOTE — Progress Notes (Signed)
[facility] OFFICE PROGRESS NOTE   Diagnosis:  Metastatic urothelial carcinoma  INTERVAL HISTORY:   Kim Donovan returns as scheduled. She completed cycle 3 Tecentriq 11/20/2015. She denies nausea/vomiting. No mouth sores. No diarrhea. No rash. No shortness of breath or cough. Appetite is better. She has persistent leg swelling. She would like to increase the Lasix from 20 mg daily to 40 mg daily. Hands feel "numb and cold". This has been present for "a long time". She has a periodic backache. She uses a heating pad and Tylenol. She continues to have pain related to rheumatoid arthritis. She occasionally feels a "strain" under the right ribs.  Objective:  Vital signs in last 24 hours:  Blood pressure 136/76, pulse 90, temperature 97.4 F (36.3 C), temperature source Oral, resp. rate 18, height 5' 6.5" (1.689 m), weight 142 lb 12.8 oz (64.774 kg), SpO2 100 %.    HEENT: No thrush or ulcers. Resp: Lungs clear bilaterally. Cardio: Regular rate and rhythm. GI: Abdomen soft and nontender. No hepatomegaly. Vascular: Trace to 1+ pitting edema bilateral legs. Skin: No rash.    Lab Results:  Lab Results  Component Value Date   WBC 7.3 12/11/2015   HGB 9.6* 12/11/2015   HCT 29.6* 12/11/2015   MCV 84.1 12/11/2015   PLT 293 12/11/2015   NEUTROABS 4.5 12/11/2015    Imaging:  No results found.  Medications: I have reviewed the patient's current medications.  Assessment/Plan: 1. Right renal mass  MRI 08/03/2015 with multiple suspicious liver lesions, replacement of the right kidney consistent with an infiltrative neoplasm, probable rectoperineal adenopathy, extension of tumor into the IVC, small right pleural effusion  Staging CTs 08/08/2015 with no lung metastases, infiltrative right renal mass extending into the right renal vein and pararenal space, no lymphadenopathy, liver metastases  Ultrasound-guided biopsy of a right liver lesion 08/10/2015 with the preliminary  pathology consistent with metastatic renal cell carcinoma, additional immunohistochemical histochemical stains most consistent with a poorly differentiated urothelial carcinoma  Initiation of pazopanib 08/17/2015, taken for 3 days  Cycle 1 Tecentriq 10/03/2015  Cycle 2 Tecentriq 10/24/2015  cycle 3 Tecentriq 11/20/2015  Cycle 4 Tecentriq 12/11/2015  2. Right abdomen/flank discomfort secondary to #1  3. Family history of renal cell cancer  4. Anorexia/weight loss secondary to #1  5. Status post aortic valve replacement  6.Polymyalgia rheumatica- Embrel on hold  7. Admission 08/20/2015 with nausea/vomiting and fever - CT abdomen/pelvis revealed new fluid attenuation surrounding the gastric antrum and hepatic flexure, stable fluid collection on a repeat CT 08/23/2015  placement of a drainage catheter 08/28/2015 with bilious fluid obtained, removed 09/18/2015  8. Probable aspiration pneumonia January 2017  9. Duodenal obstruction noted on endoscopy 08/28/2015  10. Placement of an internal/external biliary drain 08/29/2015, converted to an internal stent 09/21/2015  11. Fall 10/11/2015. Left thumb edema and ecchymosis; facial/nose edema and ecchymosis. Resolved.  12. Hyponatremia   13. Admission 11/10/2015 with a fever and cough, Treated for pneumonia with clinical improvement  14.    Elevated calcium 12/11/2015. Calcium supplements discontinued.  15.    Leg edema, likely multifactorial.     Disposition: [name] appears stable. She has completed 3 cycles of Tecentriq. Plan to proceed with cycle 4 today as scheduled. The plan is for restaging CT scans after this cycle.  She has hypercalcemia on labs today. She took several calcium supplements this morning. We instructed her to discontinue the calcium supplements. She will return for a follow-up chemistry panel in one week.  She has persistent leg edema. She notes improvement with Lasix 20 mg  daily. We instructed her to increase to 40 mg daily for one week.  She will return for a follow-up visit in 3 weeks to review the CT scan results. She will contact the office in the interim with any problems.  Plan reviewed with Dr. Benay Spice. 25 minutes were spent face-to-face at today's visit with the majority of that time involved in counseling/coordination of care.    Ned Card ANP/GNP-BC   12/11/2015  9:38 AM

## 2015-12-11 NOTE — Patient Instructions (Signed)
[facility] Discharge Instructions for Patients Receiving Chemotherapy  Today you received the following chemotherapy agents: Tencentriq.  To help prevent nausea and vomiting after your treatment, we encourage you to take your nausea medication: Zofran 4 mg every 6 hours as needed.   If you develop nausea and vomiting that is not controlled by your nausea medication, call the clinic.   BELOW ARE SYMPTOMS THAT SHOULD BE REPORTED IMMEDIATELY:  *FEVER GREATER THAN 100.5 F  *CHILLS WITH OR WITHOUT FEVER  NAUSEA AND VOMITING THAT IS NOT CONTROLLED WITH YOUR NAUSEA MEDICATION  *UNUSUAL SHORTNESS OF BREATH  *UNUSUAL BRUISING OR BLEEDING  TENDERNESS IN MOUTH AND THROAT WITH OR WITHOUT PRESENCE OF ULCERS  *URINARY PROBLEMS  *BOWEL PROBLEMS  UNUSUAL RASH Items with * indicate a potential emergency and should be followed up as soon as possible.  Feel free to call the clinic you have any questions or concerns. The clinic phone number is [phone].  Please show the [facility] at check-in to the Emergency Department and triage nurse.

## 2015-12-11 NOTE — Telephone Encounter (Signed)
Per staff message and POF I have scheduled appts. Advised scheduler of appts. JMW  

## 2015-12-18 ENCOUNTER — Ambulatory Visit (HOSPITAL_BASED_OUTPATIENT_CLINIC_OR_DEPARTMENT_OTHER): Payer: Medicare Other

## 2015-12-18 ENCOUNTER — Telehealth: Payer: Self-pay

## 2015-12-18 ENCOUNTER — Other Ambulatory Visit: Payer: Self-pay | Admitting: Nurse Practitioner

## 2015-12-18 ENCOUNTER — Telehealth: Payer: Self-pay | Admitting: Oncology

## 2015-12-18 ENCOUNTER — Encounter: Payer: Self-pay | Admitting: Nurse Practitioner

## 2015-12-18 ENCOUNTER — Other Ambulatory Visit (HOSPITAL_BASED_OUTPATIENT_CLINIC_OR_DEPARTMENT_OTHER): Payer: Medicare Other

## 2015-12-18 DIAGNOSIS — C641 Malignant neoplasm of right kidney, except renal pelvis: Secondary | ICD-10-CM | POA: Diagnosis not present

## 2015-12-18 DIAGNOSIS — C7910 Secondary malignant neoplasm of unspecified urinary organs: Secondary | ICD-10-CM

## 2015-12-18 DIAGNOSIS — C787 Secondary malignant neoplasm of liver and intrahepatic bile duct: Secondary | ICD-10-CM | POA: Diagnosis not present

## 2015-12-18 HISTORY — DX: Hypercalcemia: E83.52

## 2015-12-18 LAB — COMPREHENSIVE METABOLIC PANEL
ALBUMIN: 3 g/dL — AB (ref 3.5–5.0)
ALK PHOS: 385 U/L — AB (ref 40–150)
ALT: 50 U/L (ref 0–55)
ANION GAP: 9 meq/L (ref 3–11)
AST: 108 U/L — ABNORMAL HIGH (ref 5–34)
BILIRUBIN TOTAL: 0.72 mg/dL (ref 0.20–1.20)
BUN: 29.5 mg/dL — ABNORMAL HIGH (ref 7.0–26.0)
CALCIUM: 11.4 mg/dL — AB (ref 8.4–10.4)
CO2: 27 mEq/L (ref 22–29)
Chloride: 96 mEq/L — ABNORMAL LOW (ref 98–109)
Creatinine: 1.4 mg/dL — ABNORMAL HIGH (ref 0.6–1.1)
EGFR: 33 mL/min/{1.73_m2} — ABNORMAL LOW (ref >90)
GLUCOSE: 111 mg/dL (ref 70–140)
Potassium: 4.7 mEq/L (ref 3.5–5.1)
Sodium: 132 mEq/L — ABNORMAL LOW (ref 136–145)
TOTAL PROTEIN: 8.3 g/dL (ref 6.4–8.3)

## 2015-12-18 MED ORDER — ZOLEDRONIC ACID 4 MG/100ML IV SOLN
4.0000 mg | Freq: Once | INTRAVENOUS | Status: AC
  Administered 2015-12-18: 4 mg via INTRAVENOUS
  Filled 2015-12-18: qty 100

## 2015-12-18 MED FILL — DRONABINOL 2.5 MG CAPSULE: 2.5 | 30 days supply | Qty: 60 | Fill #0

## 2015-12-18 NOTE — Progress Notes (Signed)
Ok to give Zometa today per Darlena in managed care - pt's insurance does not require prior auth.

## 2015-12-18 NOTE — Telephone Encounter (Signed)
Called and informed patient of elevated calcium levels. Informed patient Kim Donovan would like patient to come in today for zometa infusion. Patient agrees to come in. Coordinated times with Lauren in infusion, patient agrees to come in today at 1415. Sharyn Lull to add appt.

## 2015-12-18 NOTE — Telephone Encounter (Signed)
lvm to inform patient of lab only appt on 5/5 at 11 am per 5/2 pof

## 2015-12-18 NOTE — Telephone Encounter (Signed)
-----   Message from Owens Shark, NP sent at 12/18/2015 10:47 AM EDT ----- Please let her know the calcium level is still elevated. She needs to come in for Zometa today. Please coordinate with infusion.

## 2015-12-18 NOTE — Patient Instructions (Signed)

## 2015-12-21 ENCOUNTER — Other Ambulatory Visit (HOSPITAL_BASED_OUTPATIENT_CLINIC_OR_DEPARTMENT_OTHER): Payer: Medicare Other

## 2015-12-21 ENCOUNTER — Telehealth: Payer: Self-pay | Admitting: Nurse Practitioner

## 2015-12-21 DIAGNOSIS — C641 Malignant neoplasm of right kidney, except renal pelvis: Secondary | ICD-10-CM | POA: Diagnosis not present

## 2015-12-21 DIAGNOSIS — C7910 Secondary malignant neoplasm of unspecified urinary organs: Secondary | ICD-10-CM

## 2015-12-21 DIAGNOSIS — C787 Secondary malignant neoplasm of liver and intrahepatic bile duct: Secondary | ICD-10-CM

## 2015-12-21 LAB — COMPREHENSIVE METABOLIC PANEL
ALT: 59 U/L — ABNORMAL HIGH (ref 0–55)
ANION GAP: 8 meq/L (ref 3–11)
AST: 133 U/L — ABNORMAL HIGH (ref 5–34)
Albumin: 2.9 g/dL — ABNORMAL LOW (ref 3.5–5.0)
Alkaline Phosphatase: 467 U/L — ABNORMAL HIGH (ref 40–150)
BUN: 28.3 mg/dL — ABNORMAL HIGH (ref 7.0–26.0)
CHLORIDE: 98 meq/L (ref 98–109)
CO2: 26 meq/L (ref 22–29)
Calcium: 9.8 mg/dL (ref 8.4–10.4)
Creatinine: 1.3 mg/dL — ABNORMAL HIGH (ref 0.6–1.1)
EGFR: 39 mL/min/{1.73_m2} — AB (ref >90)
Glucose: 104 mg/dl (ref 70–140)
Potassium: 5.1 mEq/L (ref 3.5–5.1)
Sodium: 132 mEq/L — ABNORMAL LOW (ref 136–145)
Total Bilirubin: 0.47 mg/dL (ref 0.20–1.20)
Total Protein: 8.1 g/dL (ref 6.4–8.3)

## 2015-12-21 NOTE — Telephone Encounter (Signed)
I notified Kim Donovan the calcium level was improved on labs today. Follow-up as scheduled.

## 2015-12-27 ENCOUNTER — Ambulatory Visit (HOSPITAL_COMMUNITY)
Admission: RE | Admit: 2015-12-27 | Discharge: 2015-12-27 | Disposition: A | Discharge status: Home / Self Care | Payer: Medicare Other | Source: Ambulatory Visit | Attending: Nurse Practitioner | Admitting: Nurse Practitioner

## 2015-12-27 ENCOUNTER — Encounter (HOSPITAL_COMMUNITY): Payer: Self-pay

## 2015-12-27 ENCOUNTER — Other Ambulatory Visit: Payer: Self-pay | Admitting: Nurse Practitioner

## 2015-12-27 ENCOUNTER — Telehealth: Payer: Self-pay | Admitting: Nurse Practitioner

## 2015-12-27 DIAGNOSIS — C787 Secondary malignant neoplasm of liver and intrahepatic bile duct: Secondary | ICD-10-CM | POA: Diagnosis present

## 2015-12-27 DIAGNOSIS — I517 Cardiomegaly: Secondary | ICD-10-CM | POA: Insufficient documentation

## 2015-12-27 DIAGNOSIS — I82413 Acute embolism and thrombosis of femoral vein, bilateral: Secondary | ICD-10-CM | POA: Insufficient documentation

## 2015-12-27 DIAGNOSIS — M7989 Other specified soft tissue disorders: Secondary | ICD-10-CM | POA: Insufficient documentation

## 2015-12-27 DIAGNOSIS — I81 Portal vein thrombosis: Secondary | ICD-10-CM | POA: Diagnosis not present

## 2015-12-27 DIAGNOSIS — N2889 Other specified disorders of kidney and ureter: Secondary | ICD-10-CM | POA: Diagnosis not present

## 2015-12-27 DIAGNOSIS — C7910 Secondary malignant neoplasm of unspecified urinary organs: Secondary | ICD-10-CM | POA: Insufficient documentation

## 2015-12-27 DIAGNOSIS — I82423 Acute embolism and thrombosis of iliac vein, bilateral: Secondary | ICD-10-CM | POA: Insufficient documentation

## 2015-12-27 HISTORY — DX: Acute embolism and thrombosis of femoral vein, bilateral: I82.413

## 2015-12-27 MED ORDER — IOPAMIDOL (ISOVUE-300) INJECTION 61%
100.0000 mL | Freq: Once | INTRAVENOUS | Status: AC | PRN
  Administered 2015-12-27: 100 mL via INTRAVENOUS

## 2015-12-27 NOTE — Telephone Encounter (Signed)
Spoke with patient to confirm 5/12 appt at 1015 am per LT 5/11 pof

## 2015-12-28 ENCOUNTER — Other Ambulatory Visit (HOSPITAL_BASED_OUTPATIENT_CLINIC_OR_DEPARTMENT_OTHER): Payer: Medicare Other

## 2015-12-28 ENCOUNTER — Encounter: Payer: Self-pay | Admitting: Nurse Practitioner

## 2015-12-28 ENCOUNTER — Other Ambulatory Visit: Payer: Self-pay | Admitting: Nurse Practitioner

## 2015-12-28 ENCOUNTER — Encounter: Payer: Self-pay | Admitting: *Deleted

## 2015-12-28 ENCOUNTER — Ambulatory Visit (HOSPITAL_BASED_OUTPATIENT_CLINIC_OR_DEPARTMENT_OTHER): Payer: Medicare Other | Admitting: Nurse Practitioner

## 2015-12-28 ENCOUNTER — Telehealth: Payer: Self-pay

## 2015-12-28 ENCOUNTER — Telehealth: Payer: Self-pay | Admitting: Oncology

## 2015-12-28 VITALS — BP 114/62 | HR 83 | Temp 97.6°F | Resp 18 | Wt 139.8 lb

## 2015-12-28 DIAGNOSIS — C787 Secondary malignant neoplasm of liver and intrahepatic bile duct: Secondary | ICD-10-CM

## 2015-12-28 DIAGNOSIS — C7910 Secondary malignant neoplasm of unspecified urinary organs: Secondary | ICD-10-CM

## 2015-12-28 DIAGNOSIS — I82401 Acute embolism and thrombosis of unspecified deep veins of right lower extremity: Secondary | ICD-10-CM | POA: Diagnosis not present

## 2015-12-28 DIAGNOSIS — I82413 Acute embolism and thrombosis of femoral vein, bilateral: Secondary | ICD-10-CM

## 2015-12-28 DIAGNOSIS — C641 Malignant neoplasm of right kidney, except renal pelvis: Secondary | ICD-10-CM

## 2015-12-28 LAB — COMPREHENSIVE METABOLIC PANEL
ALK PHOS: 587 U/L — AB (ref 40–150)
ALT: 50 U/L (ref 0–55)
ANION GAP: 9 meq/L (ref 3–11)
AST: 109 U/L — ABNORMAL HIGH (ref 5–34)
Albumin: 2.9 g/dL — ABNORMAL LOW (ref 3.5–5.0)
BUN: 23.5 mg/dL (ref 7.0–26.0)
CALCIUM: 9.2 mg/dL (ref 8.4–10.4)
CO2: 23 mEq/L (ref 22–29)
Chloride: 100 mEq/L (ref 98–109)
Creatinine: 1.1 mg/dL (ref 0.6–1.1)
EGFR: 45 mL/min/{1.73_m2} — AB (ref >90)
Glucose: 94 mg/dl (ref 70–140)
POTASSIUM: 4.8 meq/L (ref 3.5–5.1)
SODIUM: 132 meq/L — AB (ref 136–145)
Total Bilirubin: 0.44 mg/dL (ref 0.20–1.20)
Total Protein: 8.2 g/dL (ref 6.4–8.3)

## 2015-12-28 MED ORDER — ENOXAPARIN SODIUM 60 MG/0.6ML ~~LOC~~ SOLN: 60.0000 mg | SUBCUTANEOUS | Status: AC

## 2015-12-28 MED FILL — ENOXAPARIN 60 MG/0.6 ML SYR: 60 | 30 days supply | Qty: 18 | Fill #0

## 2015-12-28 NOTE — Progress Notes (Signed)
Pt.'s husband instructed on how to give a lovenox injection over the phone per request of L. Thomas NP.  Pt.'s husband has given himself injections for years and verbalized an understanding of how to give injection.  Teach back done.

## 2015-12-28 NOTE — Telephone Encounter (Signed)
Called [facility]) to see if they would be able to give patient Lovenox injections today 5/12 through 5/15. Left message for nurse to call back to the cancer center.

## 2015-12-28 NOTE — Progress Notes (Addendum)
[place] OFFICE PROGRESS NOTE   Diagnosis:  Metastatic urothelial carcinoma.  INTERVAL HISTORY:   Ms. Lipsett returns for follow-up. She completed cycle 4 Tecentriq 12/11/2015. She had restaging CT scans yesterday and is here today to discuss the results.  She reports a poor appetite and energy level. She has intermittent right-sided low abdominal and back pain. She wonders if the pain is related to arthritis and/or scoliosis. She does not want to take pain medication. She has occasional nausea/vomiting. She denies any bleeding. She continues to have leg swelling.  Objective:  Vital signs in last 24 hours:  Blood pressure 114/62, pulse 83, temperature 97.6 F (36.4 C), temperature source Oral, resp. rate 18, weight 139 lb 12.8 oz (63.413 kg), SpO2 100 %.    HEENT: No thrush or ulcers. Resp: Lungs clear bilaterally. Cardio: Regular rate and rhythm. GI: Fullness right upper abdomen. No discrete hepatomegaly. Vascular: Edema throughout both legs. Prominent vein pattern over the upper abdomen. Neuro: Alert and oriented.     Lab Results:  Lab Results  Component Value Date   WBC 7.3 12/11/2015   HGB 9.6* 12/11/2015   HCT 29.6* 12/11/2015   MCV 84.1 12/11/2015   PLT 293 12/11/2015   NEUTROABS 4.5 12/11/2015    Imaging:  Ct Abdomen Pelvis W Contrast  12/27/2015  CLINICAL DATA:  Infiltrative right renal urothelial neoplasm diagnosed in late 2016, with liver lesions, tumor thrombus in the IVC and right renal vein. Restaging. EXAM: CT ABDOMEN AND PELVIS WITH CONTRAST TECHNIQUE: Multidetector CT imaging of the abdomen and pelvis was performed using the standard protocol following bolus administration of intravenous contrast. CONTRAST:  14mL ISOVUE-300 IOPAMIDOL (ISOVUE-300) INJECTION 61% COMPARISON:  09/18/2015 FINDINGS: Lower chest:  Mild cardiomegaly. Hepatobiliary: New innumerable hepatic metastatic lesions are present in confluent throughout all segments of the  liver. One new solid but index lesion posteriorly in the right hepatic lobe on image 11/ 2 measures 3.1 by 2.9 cm. Minimal intrahepatic biliary dilatation. Pneumobilia in the left hepatic lobe. Prior percutaneous biliary drain has been replaced with an expanding biliary stent. Extensive infiltrative tumor in the porta hepatis is specially posteriorly, surrounding the common bile duct. Pancreas: Atrophic.  Dilated dorsal pancreatic duct, as before. Spleen: Unremarkable Adrenals/Urinary Tract: In the expected location of the right kidney there is a large infiltrative mass with little in the way of recognizable right renal parenchyma. This infiltrative mass extends into the porta hepatis and completely effaces or invades the infra hepatic IVC, with extensive subcutaneous and paraspinal collateralization of venous structures. The mass extends into the aortocaval space of the retroperitoneum and could possibly be invading the adjacent liver. The right adrenal gland is not visible separate from this mass. Because the mass is so diffuse an infiltrative, measurements are problematic, but measuring the entire mass including the porta hepatis portion on image 19/2 I arrive at a measurement of 11.8 by 6.6 cm, formerly 10.9 by 6.0 cm. This mass is probably invading the right psoas muscle and possibly the right posterior hemidiaphragm. Nonspecific 7 mm hypodense lesion of the left kidney upper pole medially, image 16/2. No recognizable contrast excretion from the right renal fossa mass on delayed phase images. I do not see a urothelial tumor on the left nor is there definite bladder tumor. The portal vein is occluded. Right portal venous branches are not well seen. There is some opacification of left intrahepatic portal venous branches which are presumably draining via some type of collateral. Stomach/Bowel: There is a gastroduodenal  expandable stent. The invasive tumor extends along the upper portion of the gastroduodenal  junction. The gastroduodenal stent appears patent and oral contrast medium extends into the small bowel. Prominent stool throughout the colon favors constipation. Vascular/Lymphatic: As noted above, but the intrahepatic IVC and portal vein are occluded by tumor. There is thrombosis of the right hepatic lobe portal venous branches. Extensive collaterals noted. Aortoiliac atherosclerotic vascular disease. Scattered small lymph nodes along the gastroduodenal ligament. There is deep vein thrombosis in the left common femoral vein and in the right external iliac and common femoral veins. Reproductive: Uterus absent.  Ovaries not well seen. Other: Diffuse subcutaneous and mesenteric edema. Lobular tumor below the inferior margin of the right hepatic lobe may represent peritoneal metastatic implants. The extensive collateral vessels and degree of mesenteric and omental edema make the search for peritoneal implants problematic. Ascites along the inferior margin of the right hepatic lobe and also in the right paracolic gutter. Musculoskeletal: Levoconvex upper lumbar scoliosis. IMPRESSION: 1. Prominent progression of tumor. There are new innumerable hepatic metastatic lesions throughout all segments of the liver. In combination with the enlarging right renal mass invading the porta hepatis, there is thrombosis and possible invasion of the portal vein, intrahepatic IVC, and thrombosis of the right hepatic lobe branches of the portal vein the. 2. Possible peritoneal implants of tumor along the inferior margin of the liver. Diffuse subcutaneous edema and extensive collateralization due to the IVC and portal vein thrombosis/occlusion. 3. Deep vein thrombosis in the left common iliac artery and right external and common iliac arteries, although of questionable significance for pulmonary embolus given that the IVC itself is occluded. 4. Mild cardiomegaly. 5. The biliary and gastroduodenal expandable stents appear patent.  Pneumobilia. 6. Diffuse subcutaneous and mesenteric edema. Electronically Signed   By: Van Clines M.D.   On: 12/27/2015 12:03    Medications: I have reviewed the patient's current medications.  Assessment/Plan: 1. Right renal mass  MRI 08/03/2015 with multiple suspicious liver lesions, replacement of the right kidney consistent with an infiltrative neoplasm, probable rectoperineal adenopathy, extension of tumor into the IVC, small right pleural effusion  Staging CTs 08/08/2015 with no lung metastases, infiltrative right renal mass extending into the right renal vein and pararenal space, no lymphadenopathy, liver metastases  Ultrasound-guided biopsy of a right liver lesion 08/10/2015 with the preliminary pathology consistent with metastatic renal cell carcinoma, additional immunohistochemical histochemical stains most consistent with a poorly differentiated urothelial carcinoma  Initiation of pazopanib 08/17/2015, taken for 3 days  Cycle 1 Tecentriq 10/03/2015  Cycle 2 Tecentriq 10/24/2015  cycle 3 Tecentriq 11/20/2015  Cycle 4 Tecentriq 12/11/2015  12/27/2015 CT abdomen/pelvis with new innumerable liver lesions, enlarging right renal mass invading the porta hepatis; thrombosis and possible invasion of the portal vein, intrahepatic IVC and thrombosis of the right hepatic lobe branches of the portal vein; possible peritoneal implants of tumor along the inferior margin of the liver; diffuse subcutaneous edema and extensive collateralization due to the IVC and portal vein thrombosis/occlusion; deep vein thrombosis in the left common iliac artery and right external and common iliac arteries.  2. Right abdomen/flank discomfort secondary to #1  3. Family history of renal cell cancer  4. Anorexia/weight loss secondary to #1  5. Status post aortic valve replacement  6.Polymyalgia rheumatica- Embrel on hold  7. Admission 08/20/2015 with nausea/vomiting and fever - CT  abdomen/pelvis revealed new fluid attenuation surrounding the gastric antrum and hepatic flexure, stable fluid collection on a repeat CT 08/23/2015  placement of  a drainage catheter 08/28/2015 with bilious fluid obtained, removed 09/18/2015  8. Probable aspiration pneumonia January 2017  9. Duodenal obstruction noted on endoscopy 08/28/2015  10. Placement of an internal/external biliary drain 08/29/2015, converted to an internal stent 09/21/2015  11. Fall 10/11/2015. Left thumb edema and ecchymosis; facial/nose edema and ecchymosis. Resolved.  12. Hyponatremia   13. Admission 11/10/2015 with a fever and cough,treated for pneumonia with clinical improvement  14. Elevated calcium 12/11/2015. Calcium supplements discontinued. Zometa 12/18/2015. Calcium improved 12/21/2015.  15. Leg edema likely related to #16.  16.    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  17.    Back pain likely secondary to the kidney mass.   Disposition: Ms. Irias has completed 4 cycles of Tecentriq. The recent restaging CT evaluation shows evidence of disease progression in the liver and also the right renal mass. Dr. Benay Spice reviewed the CT results with Ms. Pricer, her husband and sister at today's visit. Tecentriq will be discontinued. Dr. Benay Spice recommends a supportive care approach with a hospice referral.  A trial of chemotherapy with carboplatin/gemcitabine was also discussed. She understands she is a borderline candidate for chemotherapy given her performance status. She would like to consider these options and discuss further at her visit next week.  The right low abdominal and back pain are most likely related to the renal mass. She declines a prescription for pain  medication.  The CT scan showed IVC and portal vein thrombosis/occlusion by tumor and deep vein thrombosis in the left common iliac artery and right external and common iliac arteries. We discussed that these findings are the likely cause of the persistent leg edema which is very uncomfortable for her. We discussed daily Lovenox injections which she is in agreement with but will need to check her renal function today to see if she is a candidate for Lovenox.  She received Zometa for hypercalcemia 12/18/2015. Calcium was improved on 12/21/2015. We will obtain a repeat calcium level today.  Ms. Hoecker will return for a follow-up visit as scheduled on 01/01/2016. She will contact the office in the interim with any problems. We will contact her regarding anticoagulation once today's lab results are available.  Patient seen with Dr. Benay Spice. 30 minutes were spent face-to-face at today's visit with the majority of that time involved in counseling/coordination of care.    Ned Card ANP/GNP-BC   12/28/2015  10:39 AM  This was a shared visit with Ned Card. Ms. Leseman was interviewed and examined. We reviewed the CT findings with her. We discussed a trial of salvage chemotherapy versus Hospice care. I recommend hospice care. Ms. Friedrichs would like to think about this and return for further discussion on 01/01/2016.  Deep vein thromboses are noted on the restaging CT. She is symptomatic with leg swelling. This is limiting her activity. We decided to begin a trial of Lovenox anticoagulation. Her husband received teaching on administering Lovenox.  I discussed the case with Dr. [name].  Julieanne Manson, M.D.

## 2015-12-28 NOTE — Telephone Encounter (Signed)
Gave and printed appt sched and avs fo rpt for May °

## 2015-12-30 ENCOUNTER — Other Ambulatory Visit: Payer: Self-pay | Admitting: Oncology

## 2015-12-31 ENCOUNTER — Telehealth: Payer: Self-pay | Admitting: *Deleted

## 2015-12-31 ENCOUNTER — Other Ambulatory Visit: Payer: Self-pay | Admitting: Oncology

## 2015-12-31 NOTE — Telephone Encounter (Signed)
Called pt to adjust appt for 5/16. She agrees to come in for 10AM office visit. We can send pt to lab after visit if needed.

## 2016-01-01 ENCOUNTER — Ambulatory Visit (HOSPITAL_BASED_OUTPATIENT_CLINIC_OR_DEPARTMENT_OTHER): Payer: Medicare Other | Admitting: Oncology

## 2016-01-01 ENCOUNTER — Ambulatory Visit: Payer: Medicare Other

## 2016-01-01 ENCOUNTER — Telehealth: Payer: Self-pay | Admitting: Nurse Practitioner

## 2016-01-01 ENCOUNTER — Other Ambulatory Visit: Payer: Medicare Other

## 2016-01-01 VITALS — BP 101/61 | HR 106 | Temp 97.8°F | Resp 16 | Ht 66.5 in | Wt 139.9 lb

## 2016-01-01 DIAGNOSIS — C641 Malignant neoplasm of right kidney, except renal pelvis: Secondary | ICD-10-CM

## 2016-01-01 DIAGNOSIS — C787 Secondary malignant neoplasm of liver and intrahepatic bile duct: Secondary | ICD-10-CM | POA: Diagnosis not present

## 2016-01-01 DIAGNOSIS — E876 Hypokalemia: Secondary | ICD-10-CM

## 2016-01-01 DIAGNOSIS — C7910 Secondary malignant neoplasm of unspecified urinary organs: Secondary | ICD-10-CM

## 2016-01-01 MED ORDER — HYDROCODONE-ACETAMINOPHEN 5-325 MG PO TABS: 1.0000 | ORAL_TABLET | ORAL | Status: AC | PRN

## 2016-01-01 MED ORDER — HYDROCODONE-ACETAMINOPHEN 5-325 MG PO TABS
1.0000 | ORAL_TABLET | Freq: Once | ORAL | Status: AC
  Administered 2016-01-01: 1 via ORAL

## 2016-01-01 MED ORDER — HYDROCODONE-ACETAMINOPHEN 5-325 MG PO TABS
ORAL_TABLET | ORAL | Status: AC
  Filled 2016-01-01: qty 1

## 2016-01-01 MED FILL — ONDANSETRON HCL 4 MG TABLET: 4 | 5 days supply | Qty: 20 | Fill #0

## 2016-01-01 MED FILL — HYDROCODON-APAP 5-325: 5-325 | 10 days supply | Qty: 60 | Fill #0

## 2016-01-01 NOTE — Addendum Note (Signed)
Addended by: Rosalio Macadamia C on: 01/01/2016 10:50 AM   Modules accepted: Orders, Medications

## 2016-01-01 NOTE — Progress Notes (Signed)
[place] OFFICE PROGRESS NOTE   Diagnosis: Urothelial carcinoma  INTERVAL HISTORY:   Ms. Streif returns as scheduled. She has noted mild improvement in the leg edema since starting Lovenox. No bleeding. She has a poor appetite. She has increased pain in the right abdomen. She requests a pain medication.    Objective:  Vital signs in last 24 hours:  Blood pressure 101/61, pulse 106, temperature 97.8 F (36.6 C), temperature source Oral, resp. rate 16, height 5' 6.5" (1.689 m), weight 139 lb 14.4 oz (63.458 kg), SpO2 100 %.    HEENT: No thrush Resp: Lungs clear bilaterally Cardio: Regular rate and rhythm with premature beats GI: No hepatomegaly, nontender Vascular: 1-2 plus edema throughout both legs, venous dilatation over the trunk   Lab Results:  12/28/2015-calcium 9.2  Medications: I have reviewed the patient's current medications.  Assessment/Plan: 1. Right renal mass  MRI 08/03/2015 with multiple suspicious liver lesions, replacement of the right kidney consistent with an infiltrative neoplasm, probable rectoperineal adenopathy, extension of tumor into the IVC, small right pleural effusion  Staging CTs 08/08/2015 with no lung metastases, infiltrative right renal mass extending into the right renal vein and pararenal space, no lymphadenopathy, liver metastases  Ultrasound-guided biopsy of a right liver lesion 08/10/2015 with the preliminary pathology consistent with metastatic renal cell carcinoma, additional immunohistochemical histochemical stains most consistent with a poorly differentiated urothelial carcinoma  Initiation of pazopanib 08/17/2015, taken for 3 days  Cycle 1 Tecentriq 10/03/2015  Cycle 2 Tecentriq 10/24/2015  cycle 3 Tecentriq 11/20/2015  Cycle 4 Tecentriq 12/11/2015  12/27/2015 CT abdomen/pelvis with new innumerable liver lesions, enlarging right renal mass invading the porta hepatis; thrombosis and possible invasion of the  portal vein, intrahepatic IVC and thrombosis of the right hepatic lobe branches of the portal vein; possible peritoneal implants of tumor along the inferior margin of the liver; diffuse subcutaneous edema and extensive collateralization due to the IVC and portal vein thrombosis/occlusion; deep vein thrombosis in the left common iliac artery and right external and common iliac arteries.  2. Right abdomen/flank discomfort secondary to #1  3. Family history of renal cell cancer  4. Anorexia/weight loss secondary to #1  5. Status post aortic valve replacement  6.Polymyalgia rheumatica- Embrel on hold  7. Admission 08/20/2015 with nausea/vomiting and fever - CT abdomen/pelvis revealed new fluid attenuation surrounding the gastric antrum and hepatic flexure, stable fluid collection on a repeat CT 08/23/2015  placement of a drainage catheter 08/28/2015 with bilious fluid obtained, removed 09/18/2015  8. Probable aspiration pneumonia January 2017  9. Duodenal obstruction noted on endoscopy 08/28/2015  10. Placement of an internal/external biliary drain 08/29/2015, converted to an internal stent 09/21/2015  11. Fall 10/11/2015. Left thumb edema and ecchymosis; facial/nose edema and ecchymosis. Resolved.  12. Hyponatremia   13. Admission 11/10/2015 with a fever and cough,treated for pneumonia with clinical improvement  14. Elevated calcium 12/11/2015. Calcium supplements discontinued. Zometa 12/18/2015. Calcium improved 12/28/2015.  15. Leg edema likely related to #16.  16. 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 Lovenox started 12/28/2015  17. Abdomen/Back pain likely secondary to the kidney mass. Hydrocodone/APAP started  01/01/2016    Disposition:  Ms. Mcevers has metastatic urothelial carcinoma. She is symptomatic with pain and anorexia. Her performance status has declined. She agrees to Hospice care. We discussed  CPR and ACLS issues. She will be placed on a no CODE BLUE status.  She will begin a trial of hydrocodone for pain. Ms. Lafferty will contact us if this does not help the pain. She continues Lovenox anticoagulation. Ms. Abrew will return for an office visit in approximately 10 days.  We consolidated her medical regimen today.  Betsy Coder, MD  01/01/2016  10:33 AM

## 2016-01-01 NOTE — Telephone Encounter (Signed)
Gave pt apt & avs °

## 2016-01-03 ENCOUNTER — Telehealth: Payer: Self-pay | Admitting: *Deleted

## 2016-01-03 ENCOUNTER — Encounter: Payer: Self-pay | Admitting: Oncology

## 2016-01-03 NOTE — Telephone Encounter (Signed)
Received call from Steward Ros, RN @ [facility] requesting Senna-S for pt.  Amy stated pt is now on pain meds regimen and will need stool softener/laxative to help with bowel movement.  Dr. Benay Spice notified.  Called Amy back and left message on voice mail that it is ok with Dr. Benay Spice for pt to have Senna-S as per hospice protocol. Amy's   Phone    [phone].

## 2016-01-03 NOTE — Progress Notes (Signed)
I faxed [employer] app for tecentriq again

## 2016-01-09 ENCOUNTER — Telehealth: Payer: Self-pay | Admitting: *Deleted

## 2016-01-09 DIAGNOSIS — C7910 Secondary malignant neoplasm of unspecified urinary organs: Secondary | ICD-10-CM

## 2016-01-09 DIAGNOSIS — C787 Secondary malignant neoplasm of liver and intrahepatic bile duct: Secondary | ICD-10-CM

## 2016-01-09 MED ORDER — LORAZEPAM 0.5 MG PO TABS: 0.5000 mg | ORAL_TABLET | Freq: Four times a day (QID) | ORAL | Status: AC | PRN

## 2016-01-09 NOTE — Telephone Encounter (Signed)
Called Amy with today's Order.  [place] for delivery ([place] open caps).  At this time reports patient will NOT be in and ask that we cancel Friday's appointments.  Appointments cancelled.  Second request for lab draw in the home and asked if patient needs reglan ordered as well for nausea.

## 2016-01-09 NOTE — Telephone Encounter (Signed)
[place] [phone]) called requesting medication to help manage n/v which she is having more of.  Has zofran but vomitted four times yesterday.  Could use reglan or a suppository for severe episodes.  She may not be able to come in for f/u 01-11-2016 due to weakness.  Hospice can do the lab work in the home if needed."      F/U with Ned Card NP 01-11-2016 10:45 am

## 2016-01-09 NOTE — Telephone Encounter (Signed)
Try ativan 0.5mg  sl q6hrs prn for nausea

## 2016-01-10 ENCOUNTER — Encounter: Payer: Self-pay | Admitting: *Deleted

## 2016-01-10 ENCOUNTER — Telehealth: Payer: Self-pay | Admitting: Nurse Practitioner

## 2016-01-10 ENCOUNTER — Other Ambulatory Visit: Payer: Self-pay | Admitting: *Deleted

## 2016-01-10 MED ORDER — METOCLOPRAMIDE HCL 10 MG PO TABS: 5.0000 mg | ORAL_TABLET | Freq: Three times a day (TID) | ORAL | Status: AC

## 2016-01-10 NOTE — Telephone Encounter (Signed)
Return call placed to Amy RN with [facility] and she states that pt would like to cancel appointments on 01/11/16.  She is requesting lab draw in the home per pt.'s request for Calcium and also requesting Reglan for nausea.  Dr. Benay Spice notified of above and order received for pt to have CMET drawn at home tomorrow, 01/11/16 and OK for Reglan 5 mg AC.  Amy notified of all orders per this RN.

## 2016-01-10 NOTE — Telephone Encounter (Signed)
Apt cx per pof

## 2016-01-10 NOTE — Progress Notes (Signed)
Per Dr. Benay Spice, please call Amy RN with [facility] to assess pt for admission to [facility].  Call placed to Amy and she will assess patient tomorrow.

## 2016-01-11 ENCOUNTER — Ambulatory Visit: Payer: Medicare Other | Admitting: Nurse Practitioner

## 2016-01-11 ENCOUNTER — Other Ambulatory Visit: Payer: Medicare Other

## 2016-01-11 ENCOUNTER — Encounter: Payer: Self-pay | Admitting: *Deleted

## 2016-01-11 NOTE — Progress Notes (Signed)
Call received from Amy of [facility] to inform Dr. Benay Spice that patient is on waiting list for [facility] and will hopefully be transferred there this weekend when bed becomes available.  Dr. Benay Spice notified.

## 2016-01-21 ENCOUNTER — Telehealth: Payer: Self-pay | Admitting: *Deleted

## 2016-01-21 NOTE — Telephone Encounter (Signed)
Telephone call received from [place] at [facility] to inform Dr. Benay Spice that patient is close to passing and is comfortable at this time.  Dr. Benay Spice informed of information.

## 2016-02-16 DEATH — deceased

## 2016-03-27 ENCOUNTER — Other Ambulatory Visit: Payer: Self-pay | Admitting: Nurse Practitioner

## 2016-08-22 ENCOUNTER — Encounter: Payer: Self-pay | Admitting: Interventional Radiology

## 2016-12-08 ENCOUNTER — Telehealth: Payer: Self-pay | Admitting: Oncology

## 2016-12-08 NOTE — Telephone Encounter (Signed)
Notified social work to remove pt's name if it is on their mailing list.

## 2016-12-08 NOTE — Telephone Encounter (Signed)
Patient sister called and asked that we stop calling and stop the literature from being mailed to patient's husband because she has passed away.

## 2017-01-06 IMAGING — CT CT ABDOMEN W/O CM
2 of 4 series · 17 of 46 positions shown, 19 images · non-contrast
Comparison: 08/23/2015

CLINICAL DATA: Abscess

EXAM:
CT ABDOMEN WITHOUT CONTRAST
TECHNIQUE: Multidetector CT imaging of the abdomen was performed following the
standard protocol without IV contrast.

[Series 2: rtn a/p w/o · axial · non-contrast · 0.74mm/px · z∈[-240,+0]mm · 14 of 54 slices shown, 16 images]
[im 3/54  soft-tissue]
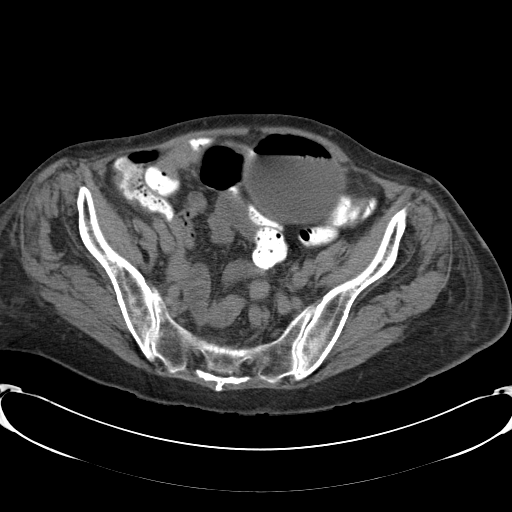
[im 3/54  bone]
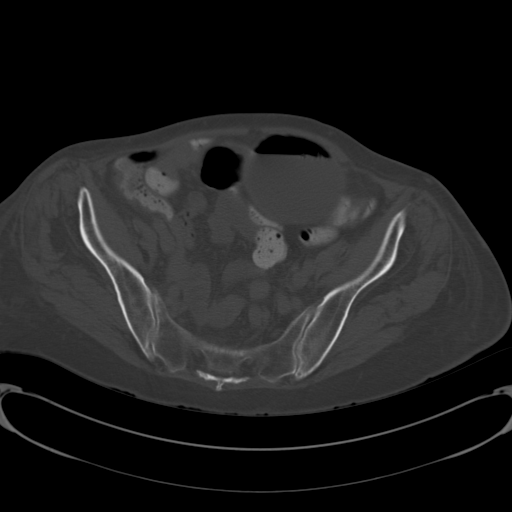
[im 7/54  soft-tissue]
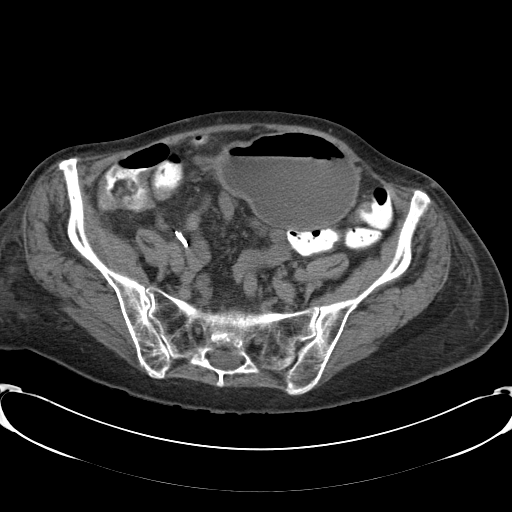
[im 12/54  soft-tissue]
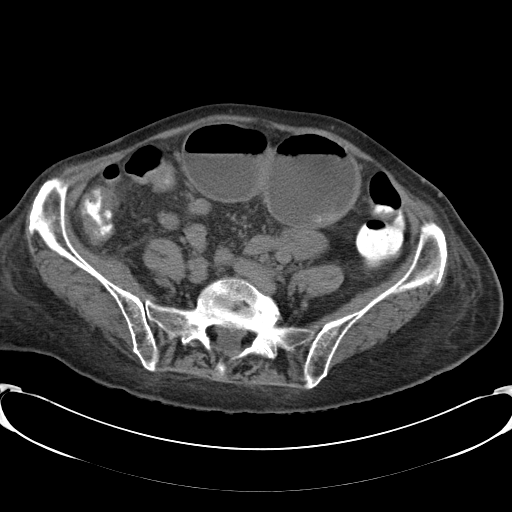
[im 14/54  soft-tissue]
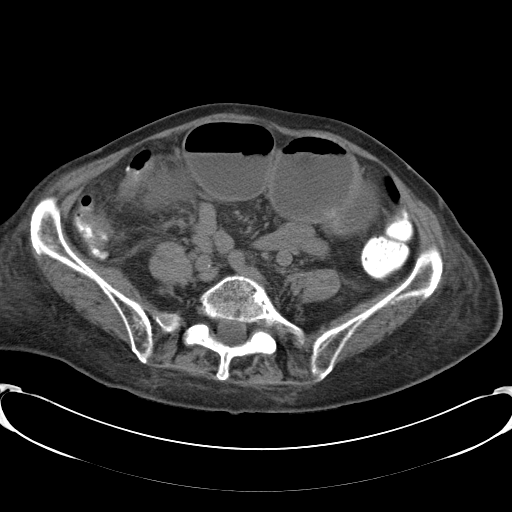
[im 18/54  soft-tissue]
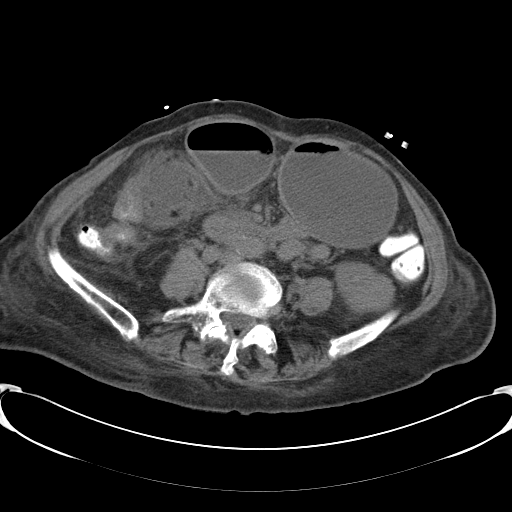
[im 23/54  soft-tissue]
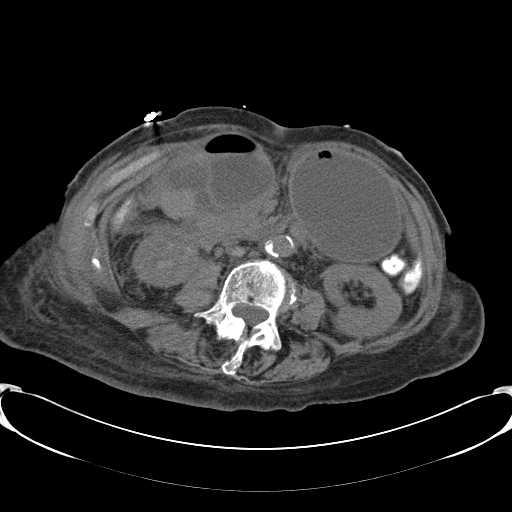
[im 25/54  soft-tissue]
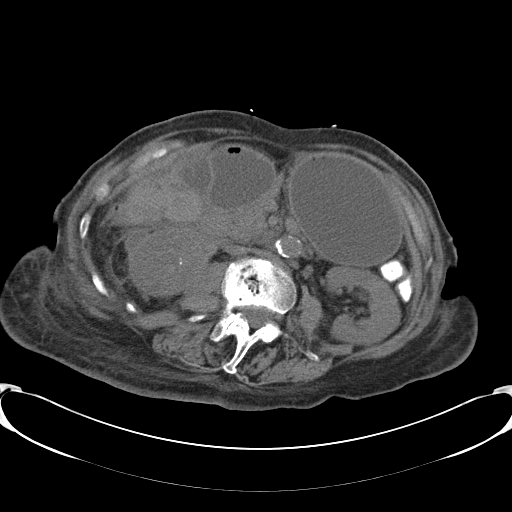
[im 29/54  soft-tissue]
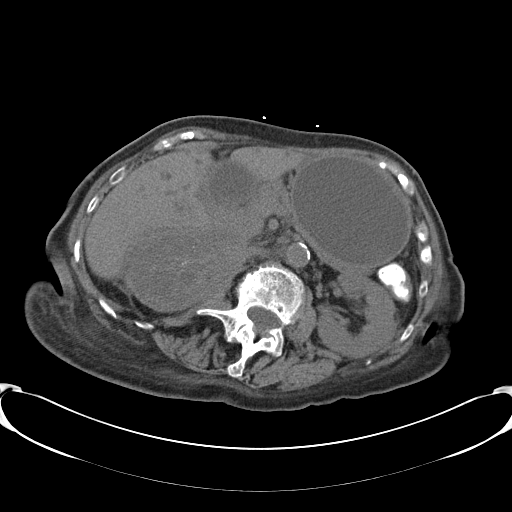
[im 31/54  soft-tissue]
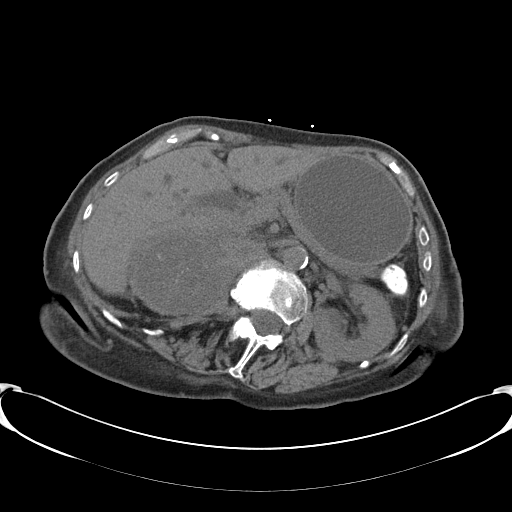
[im 31/54  bone]
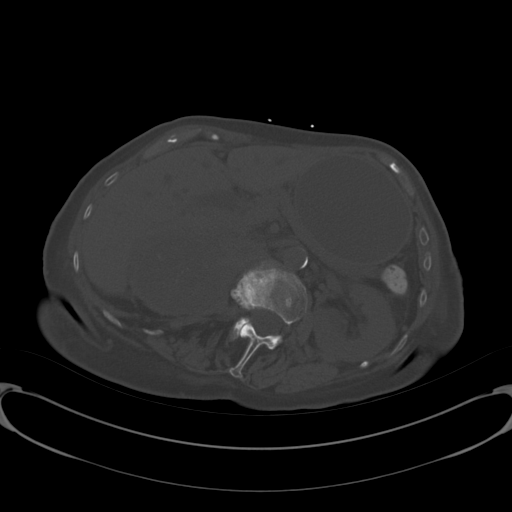
[im 36/54  soft-tissue]
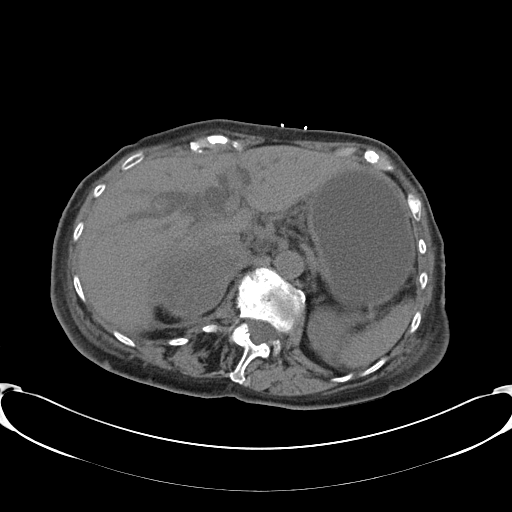
[im 40/54  soft-tissue]
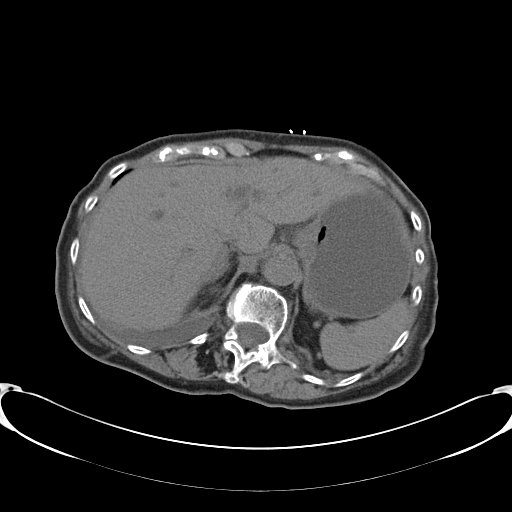
[im 42/54  soft-tissue]
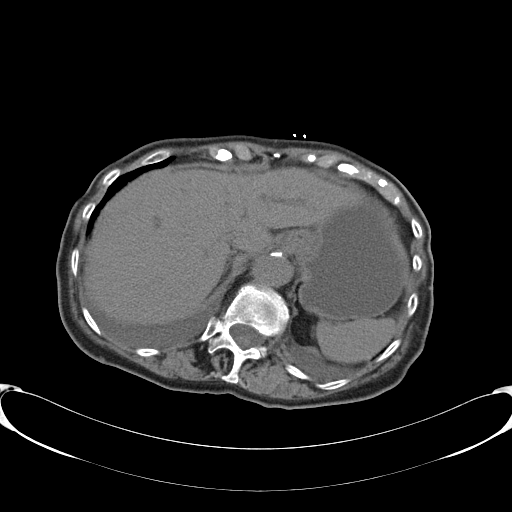
[im 47/54  soft-tissue]
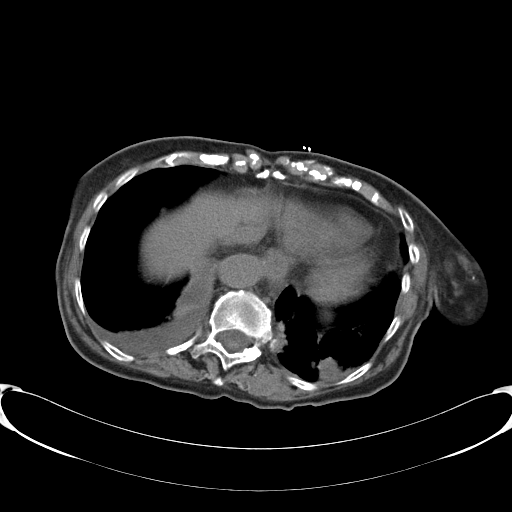
[im 51/54  soft-tissue]
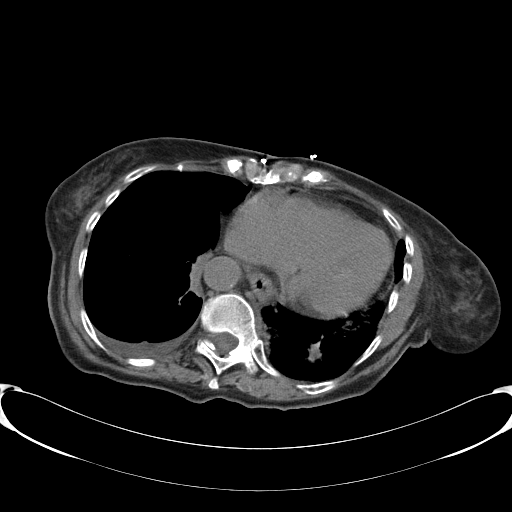

[Series 602: <mpr thick range> · coronal · 0.74mm/px · 3 of 73 slices shown]
[im 25/73  soft-tissue]
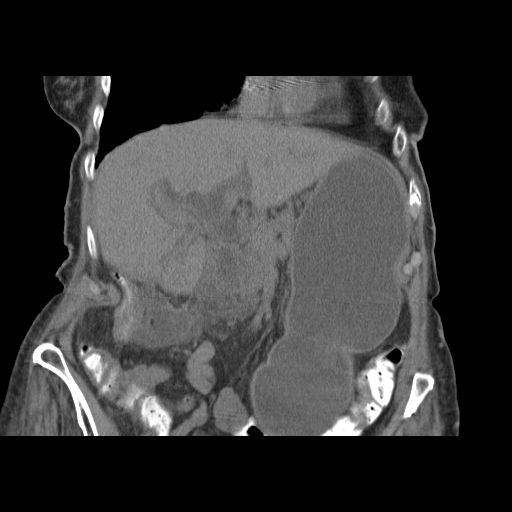
[im 33/73  soft-tissue]
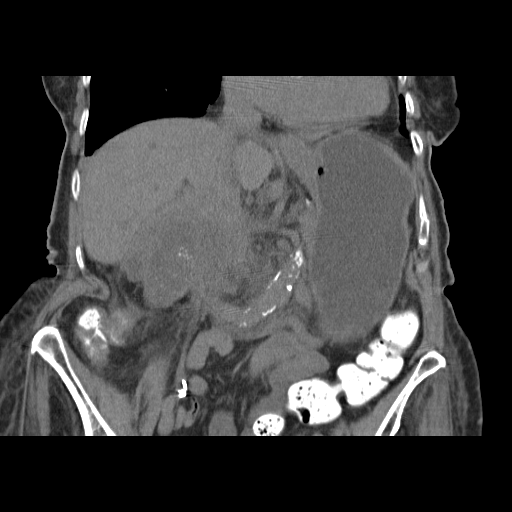
[im 41/73  soft-tissue]
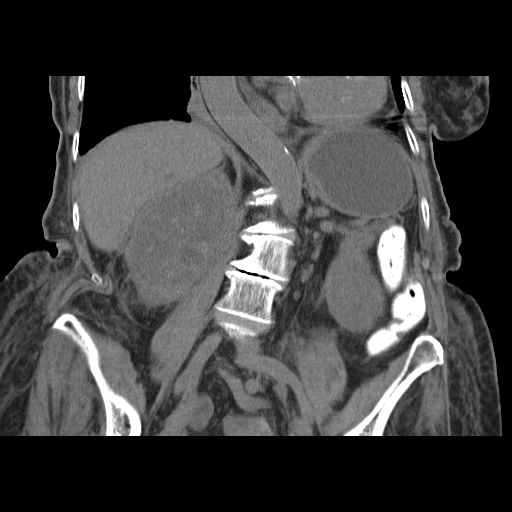

[17 of 46 positions shown; findings below may reference images not displayed]

FINDINGS: The stomach is severely distended with fluid.

The ill-defined fluid collection in the right lower quadrant measure
6.1 x 5.3 cm. It has slightly enlarged since the prior study.

Biliary dilatation persists.

Tumor infiltration of the right kidney is unchanged. No left
hydronephrosis.

Small right pleural effusion is stable.

Patchy airspace disease at the left lung base has slightly
increased.

No free-fluid in the abdomen.  No free intraperitoneal gas.
IMPRESSION: The stomach is severely distended. This presents an aspiration risk
with moderate sedation. NG tube decompression of the stomach is
recommended prior to abscess drainage.

The fluid collection in question within the right lower quadrant has
slightly enlarged.

Stable biliary dilatation.

Increasing patchy airspace disease at the left lung base. Consider
aspiration pneumonia.

## 2019-08-10 NOTE — Telephone Encounter (Signed)
Error
